# Patient Record
Sex: Female | Born: 1975
Health system: Southern US, Community
[De-identification: ages and names within clinical notes are randomized; demographics above are authoritative.]

## PROBLEM LIST (undated history)

## (undated) DIAGNOSIS — E119 Type 2 diabetes mellitus without complications: Secondary | ICD-10-CM

## (undated) DIAGNOSIS — F32A Depression, unspecified: Secondary | ICD-10-CM

## (undated) DIAGNOSIS — K219 Gastro-esophageal reflux disease without esophagitis: Secondary | ICD-10-CM

## (undated) DIAGNOSIS — J189 Pneumonia, unspecified organism: Secondary | ICD-10-CM

## (undated) DIAGNOSIS — I10 Essential (primary) hypertension: Secondary | ICD-10-CM

## (undated) DIAGNOSIS — Z9889 Other specified postprocedural states: Secondary | ICD-10-CM

## (undated) DIAGNOSIS — J449 Chronic obstructive pulmonary disease, unspecified: Secondary | ICD-10-CM

## (undated) DIAGNOSIS — R112 Nausea with vomiting, unspecified: Secondary | ICD-10-CM

## (undated) DIAGNOSIS — Z87442 Personal history of urinary calculi: Secondary | ICD-10-CM

## (undated) DIAGNOSIS — M1990 Unspecified osteoarthritis, unspecified site: Secondary | ICD-10-CM

## (undated) DIAGNOSIS — F329 Major depressive disorder, single episode, unspecified: Secondary | ICD-10-CM

## (undated) HISTORY — PX: ABDOMINAL HYSTERECTOMY: SHX81

## (undated) HISTORY — PX: TUBAL LIGATION: SHX77

## (undated) HISTORY — PX: CHOLECYSTECTOMY: SHX55

## (undated) HISTORY — PX: LITHOTRIPSY: SUR834

---

## 2000-07-23 ENCOUNTER — Emergency Department (HOSPITAL_COMMUNITY): Admission: EM | Admit: 2000-07-23 | Discharge: 2000-07-23 | Payer: Self-pay | Admitting: *Deleted

## 2000-07-23 ENCOUNTER — Encounter: Payer: Self-pay | Admitting: Emergency Medicine

## 2001-12-31 ENCOUNTER — Emergency Department (HOSPITAL_COMMUNITY): Admission: EM | Admit: 2001-12-31 | Discharge: 2001-12-31 | Payer: Self-pay | Admitting: Emergency Medicine

## 2001-12-31 ENCOUNTER — Encounter: Payer: Self-pay | Admitting: *Deleted

## 2002-06-07 ENCOUNTER — Encounter: Payer: Self-pay | Admitting: Urology

## 2002-06-07 ENCOUNTER — Ambulatory Visit (HOSPITAL_COMMUNITY): Admission: RE | Admit: 2002-06-07 | Discharge: 2002-06-07 | Payer: Self-pay | Admitting: Urology

## 2003-04-07 ENCOUNTER — Emergency Department (HOSPITAL_COMMUNITY): Admission: EM | Admit: 2003-04-07 | Discharge: 2003-04-07 | Payer: Self-pay | Admitting: Emergency Medicine

## 2003-05-03 ENCOUNTER — Emergency Department (HOSPITAL_COMMUNITY): Admission: EM | Admit: 2003-05-03 | Discharge: 2003-05-03 | Payer: Self-pay | Admitting: Emergency Medicine

## 2004-01-03 ENCOUNTER — Emergency Department (HOSPITAL_COMMUNITY): Admission: EM | Admit: 2004-01-03 | Discharge: 2004-01-03 | Payer: Self-pay | Admitting: Emergency Medicine

## 2004-02-12 ENCOUNTER — Emergency Department (HOSPITAL_COMMUNITY): Admission: EM | Admit: 2004-02-12 | Discharge: 2004-02-12 | Payer: Self-pay | Admitting: Emergency Medicine

## 2004-02-13 ENCOUNTER — Observation Stay (HOSPITAL_COMMUNITY): Admission: EM | Admit: 2004-02-13 | Discharge: 2004-02-14 | Payer: Self-pay | Admitting: Emergency Medicine

## 2004-02-17 ENCOUNTER — Inpatient Hospital Stay (HOSPITAL_COMMUNITY): Admission: EM | Admit: 2004-02-17 | Discharge: 2004-02-20 | Payer: Self-pay

## 2006-07-28 ENCOUNTER — Ambulatory Visit (HOSPITAL_COMMUNITY): Admission: RE | Admit: 2006-07-28 | Discharge: 2006-07-28 | Payer: Self-pay | Admitting: Urology

## 2006-08-12 ENCOUNTER — Ambulatory Visit (HOSPITAL_COMMUNITY): Admission: RE | Admit: 2006-08-12 | Discharge: 2006-08-12 | Payer: Self-pay | Admitting: Urology

## 2006-11-23 ENCOUNTER — Emergency Department (HOSPITAL_COMMUNITY): Admission: EM | Admit: 2006-11-23 | Discharge: 2006-11-23 | Payer: Self-pay | Admitting: Emergency Medicine

## 2006-11-25 ENCOUNTER — Inpatient Hospital Stay (HOSPITAL_COMMUNITY): Admission: EM | Admit: 2006-11-25 | Discharge: 2006-12-02 | Payer: Self-pay | Admitting: Emergency Medicine

## 2007-01-11 ENCOUNTER — Ambulatory Visit (HOSPITAL_COMMUNITY): Admission: RE | Admit: 2007-01-11 | Discharge: 2007-01-11 | Payer: Self-pay | Admitting: Internal Medicine

## 2007-01-30 ENCOUNTER — Inpatient Hospital Stay (HOSPITAL_COMMUNITY): Admission: EM | Admit: 2007-01-30 | Discharge: 2007-02-02 | Payer: Self-pay | Admitting: Emergency Medicine

## 2007-05-25 ENCOUNTER — Inpatient Hospital Stay (HOSPITAL_COMMUNITY): Admission: EM | Admit: 2007-05-25 | Discharge: 2007-05-27 | Payer: Self-pay | Admitting: Emergency Medicine

## 2007-05-26 ENCOUNTER — Encounter (INDEPENDENT_AMBULATORY_CARE_PROVIDER_SITE_OTHER): Payer: Self-pay | Admitting: Urology

## 2007-05-31 ENCOUNTER — Ambulatory Visit: Payer: Self-pay | Admitting: Gastroenterology

## 2007-05-31 ENCOUNTER — Inpatient Hospital Stay (HOSPITAL_COMMUNITY): Admission: EM | Admit: 2007-05-31 | Discharge: 2007-06-03 | Payer: Self-pay | Admitting: Emergency Medicine

## 2007-07-26 ENCOUNTER — Emergency Department (HOSPITAL_COMMUNITY): Admission: EM | Admit: 2007-07-26 | Discharge: 2007-07-26 | Payer: Self-pay | Admitting: Emergency Medicine

## 2007-07-28 ENCOUNTER — Inpatient Hospital Stay (HOSPITAL_COMMUNITY): Admission: EM | Admit: 2007-07-28 | Discharge: 2007-08-01 | Payer: Self-pay | Admitting: Emergency Medicine

## 2008-01-18 ENCOUNTER — Observation Stay (HOSPITAL_COMMUNITY): Admission: EM | Admit: 2008-01-18 | Discharge: 2008-01-20 | Payer: Self-pay | Admitting: Emergency Medicine

## 2008-07-30 ENCOUNTER — Inpatient Hospital Stay (HOSPITAL_COMMUNITY): Admission: AD | Admit: 2008-07-30 | Discharge: 2008-08-04 | Payer: Self-pay | Admitting: Urology

## 2008-08-12 ENCOUNTER — Inpatient Hospital Stay (HOSPITAL_COMMUNITY): Admission: EM | Admit: 2008-08-12 | Discharge: 2008-08-16 | Payer: Self-pay | Admitting: Emergency Medicine

## 2008-08-20 ENCOUNTER — Observation Stay (HOSPITAL_COMMUNITY): Admission: EM | Admit: 2008-08-20 | Discharge: 2008-08-23 | Payer: Self-pay | Admitting: Emergency Medicine

## 2008-08-26 ENCOUNTER — Inpatient Hospital Stay (HOSPITAL_COMMUNITY): Admission: EM | Admit: 2008-08-26 | Discharge: 2008-08-29 | Payer: Self-pay | Admitting: Emergency Medicine

## 2008-08-31 ENCOUNTER — Ambulatory Visit (HOSPITAL_COMMUNITY): Admission: RE | Admit: 2008-08-31 | Discharge: 2008-08-31 | Payer: Self-pay | Admitting: Urology

## 2008-08-31 ENCOUNTER — Encounter (INDEPENDENT_AMBULATORY_CARE_PROVIDER_SITE_OTHER): Payer: Self-pay | Admitting: Urology

## 2008-12-06 ENCOUNTER — Ambulatory Visit (HOSPITAL_COMMUNITY): Admission: RE | Admit: 2008-12-06 | Discharge: 2008-12-06 | Payer: Self-pay | Admitting: Urology

## 2009-03-05 ENCOUNTER — Observation Stay (HOSPITAL_COMMUNITY): Admission: EM | Admit: 2009-03-05 | Discharge: 2009-03-06 | Payer: Self-pay | Admitting: Emergency Medicine

## 2009-06-15 IMAGING — CR DG CHEST 2V
2 series · 2 of 2 positions shown · non-contrast
Comparison: 01/17/2008

CLINICAL DATA: Cough

CHEST - 1 VIEW

[view not recorded (1 of 2)]
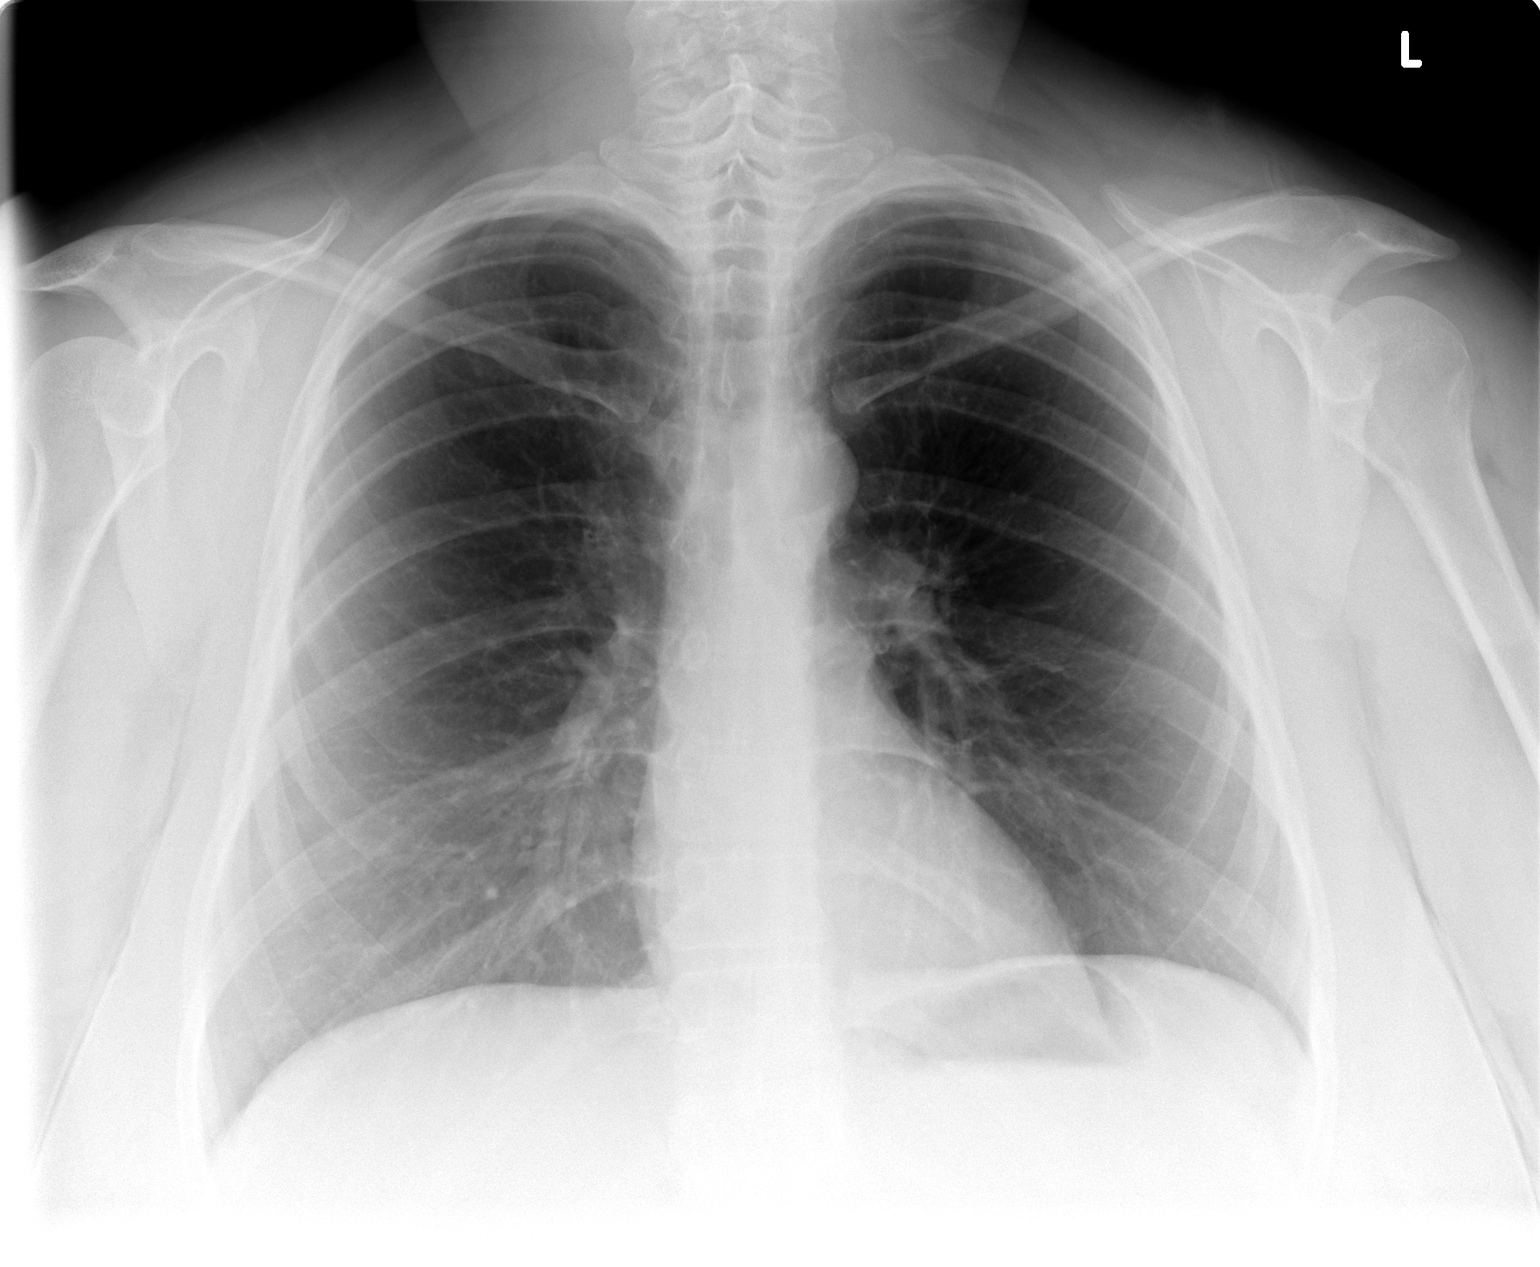

[view not recorded (2 of 2)]
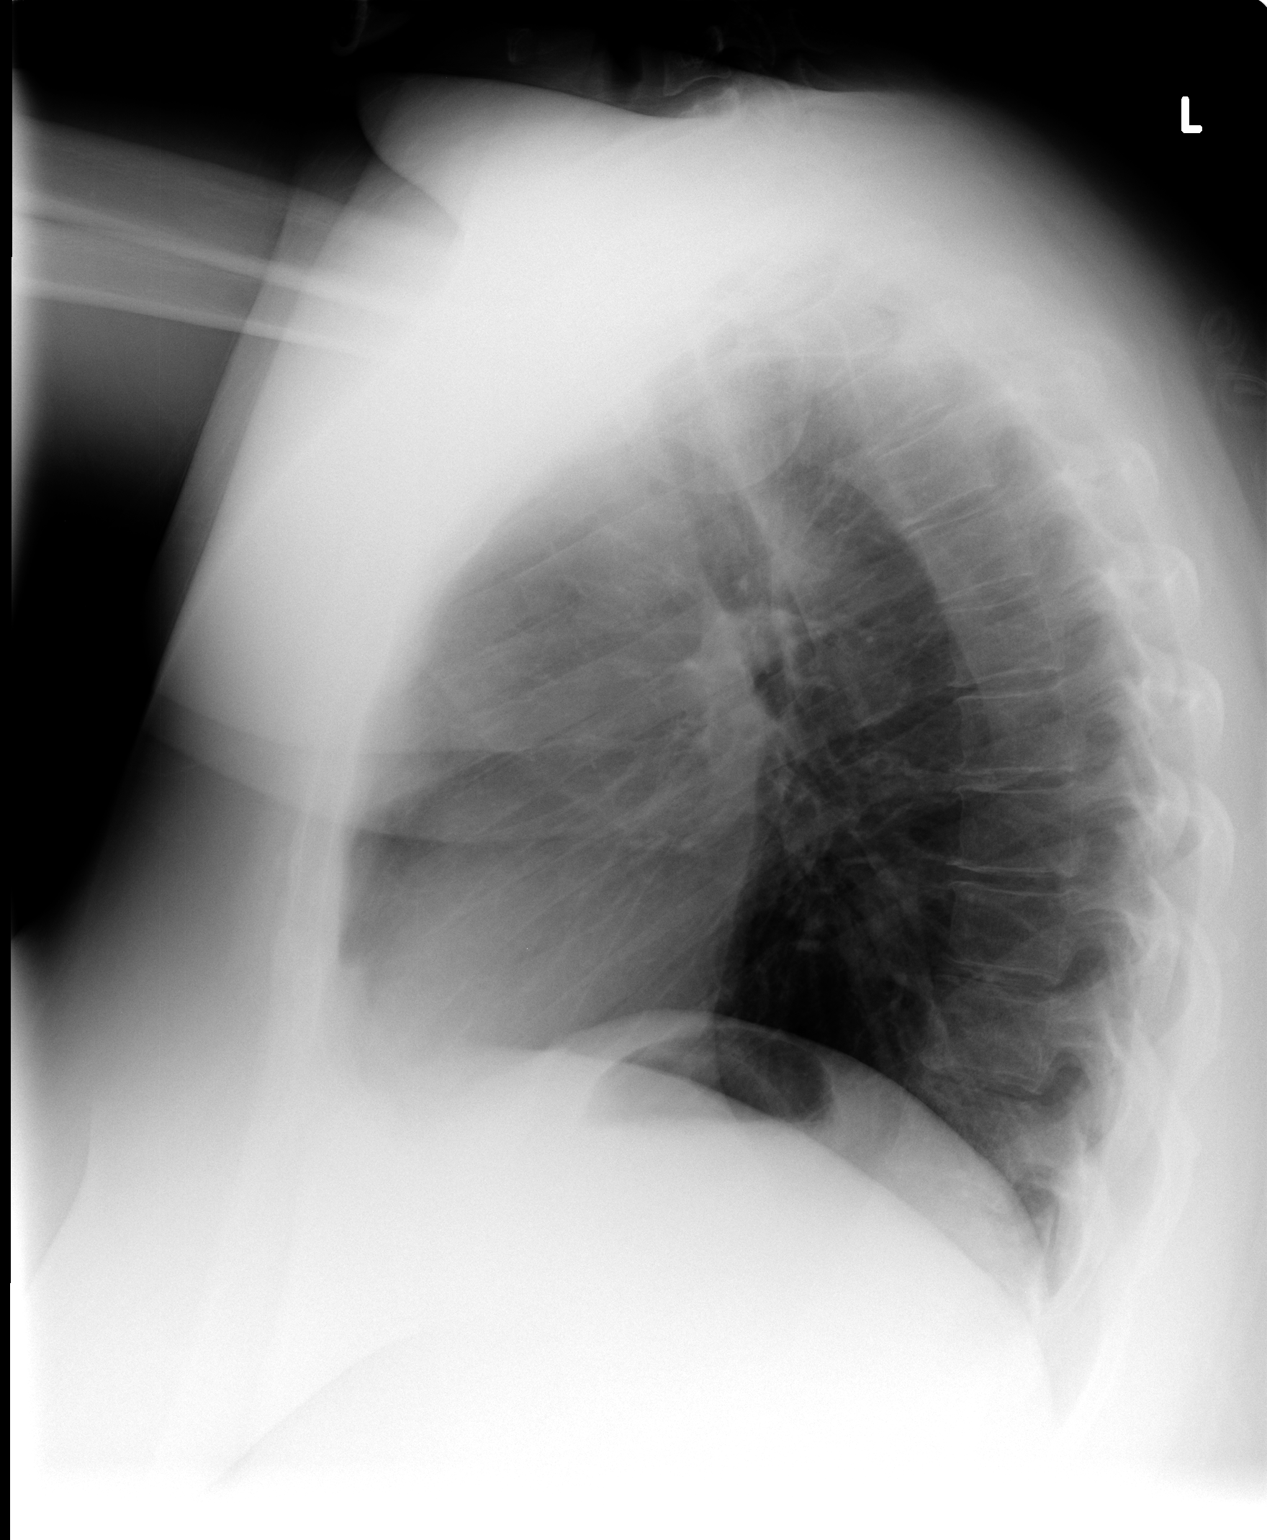

[2 of 2 positions shown; findings below may reference images not displayed]

FINDINGS: The heart size and mediastinal contours are within normal
limits.  Both lungs are clear.
IMPRESSION: No active disease.

## 2009-06-22 IMAGING — CR DG ABDOMEN 1V
2 series · 2 of 2 positions shown · non-contrast
Comparison: 08/12/2008

CLINICAL DATA: Kidney stone, left flank pain

ABDOMEN - 1 VIEW

[view not recorded (1 of 2)]
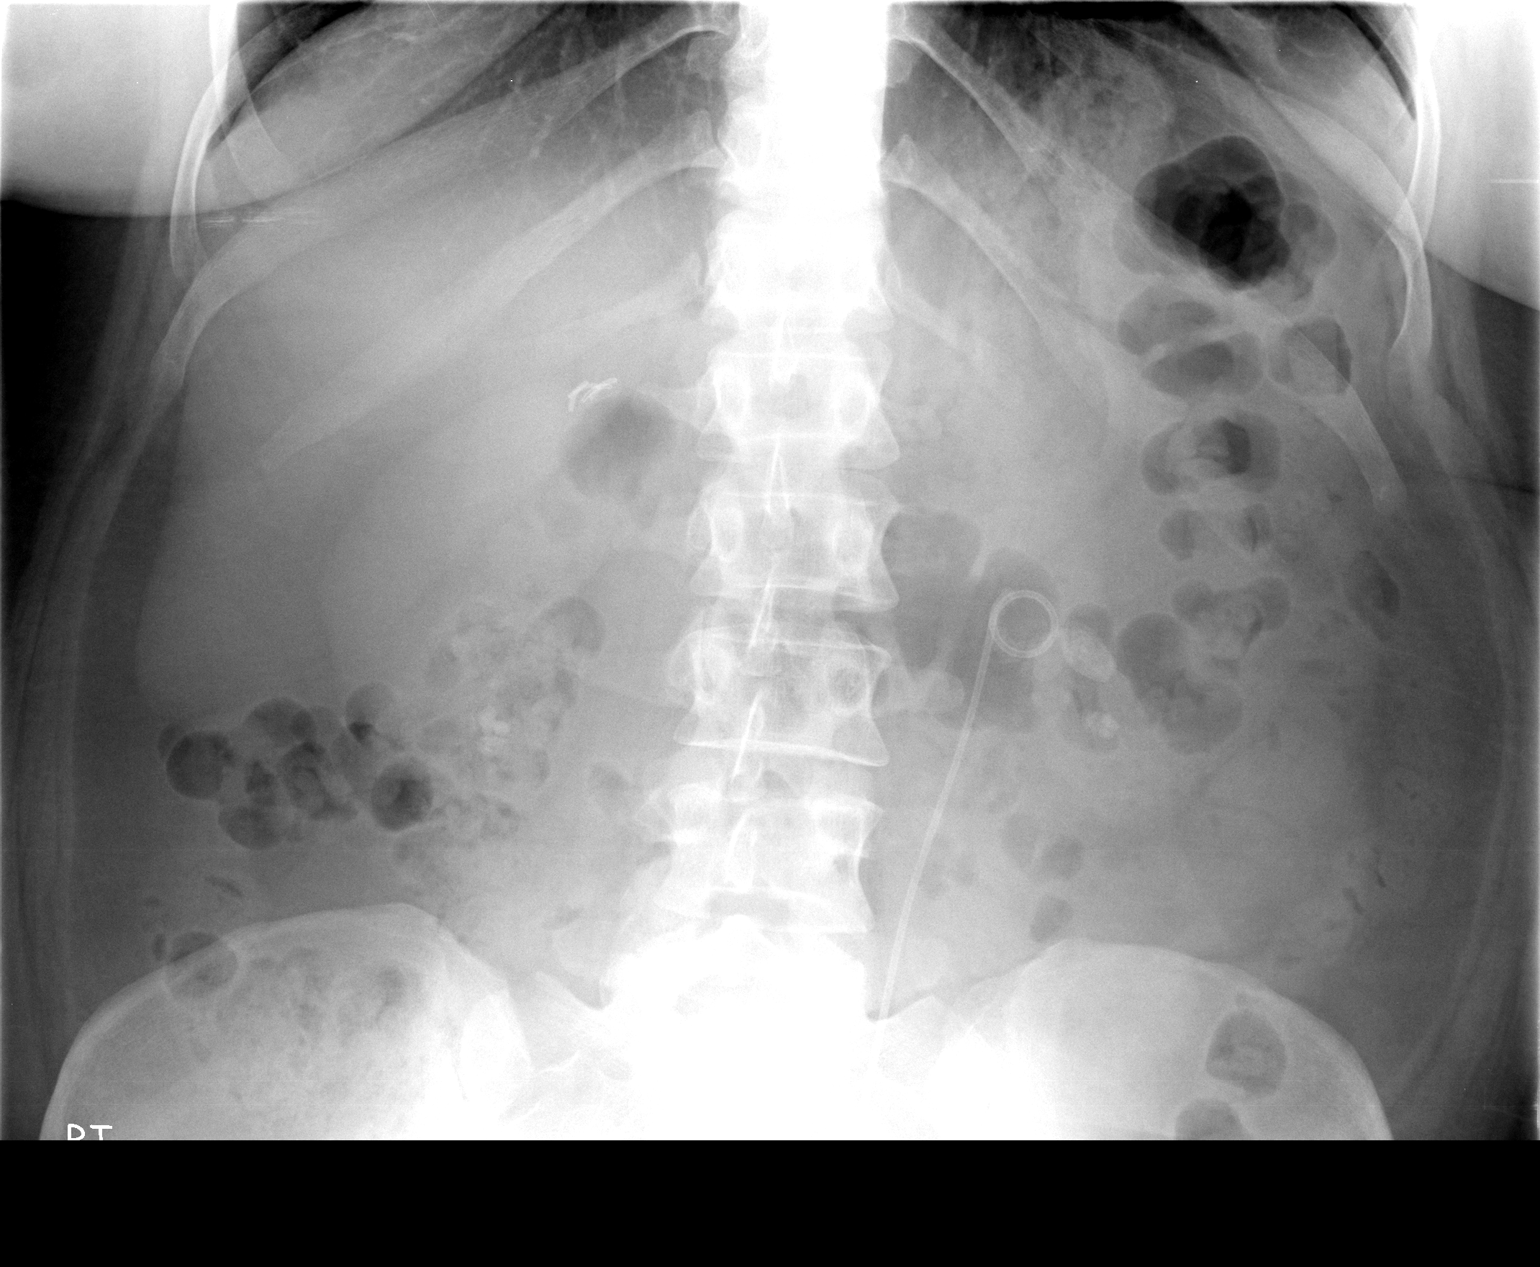

[view not recorded (2 of 2)]
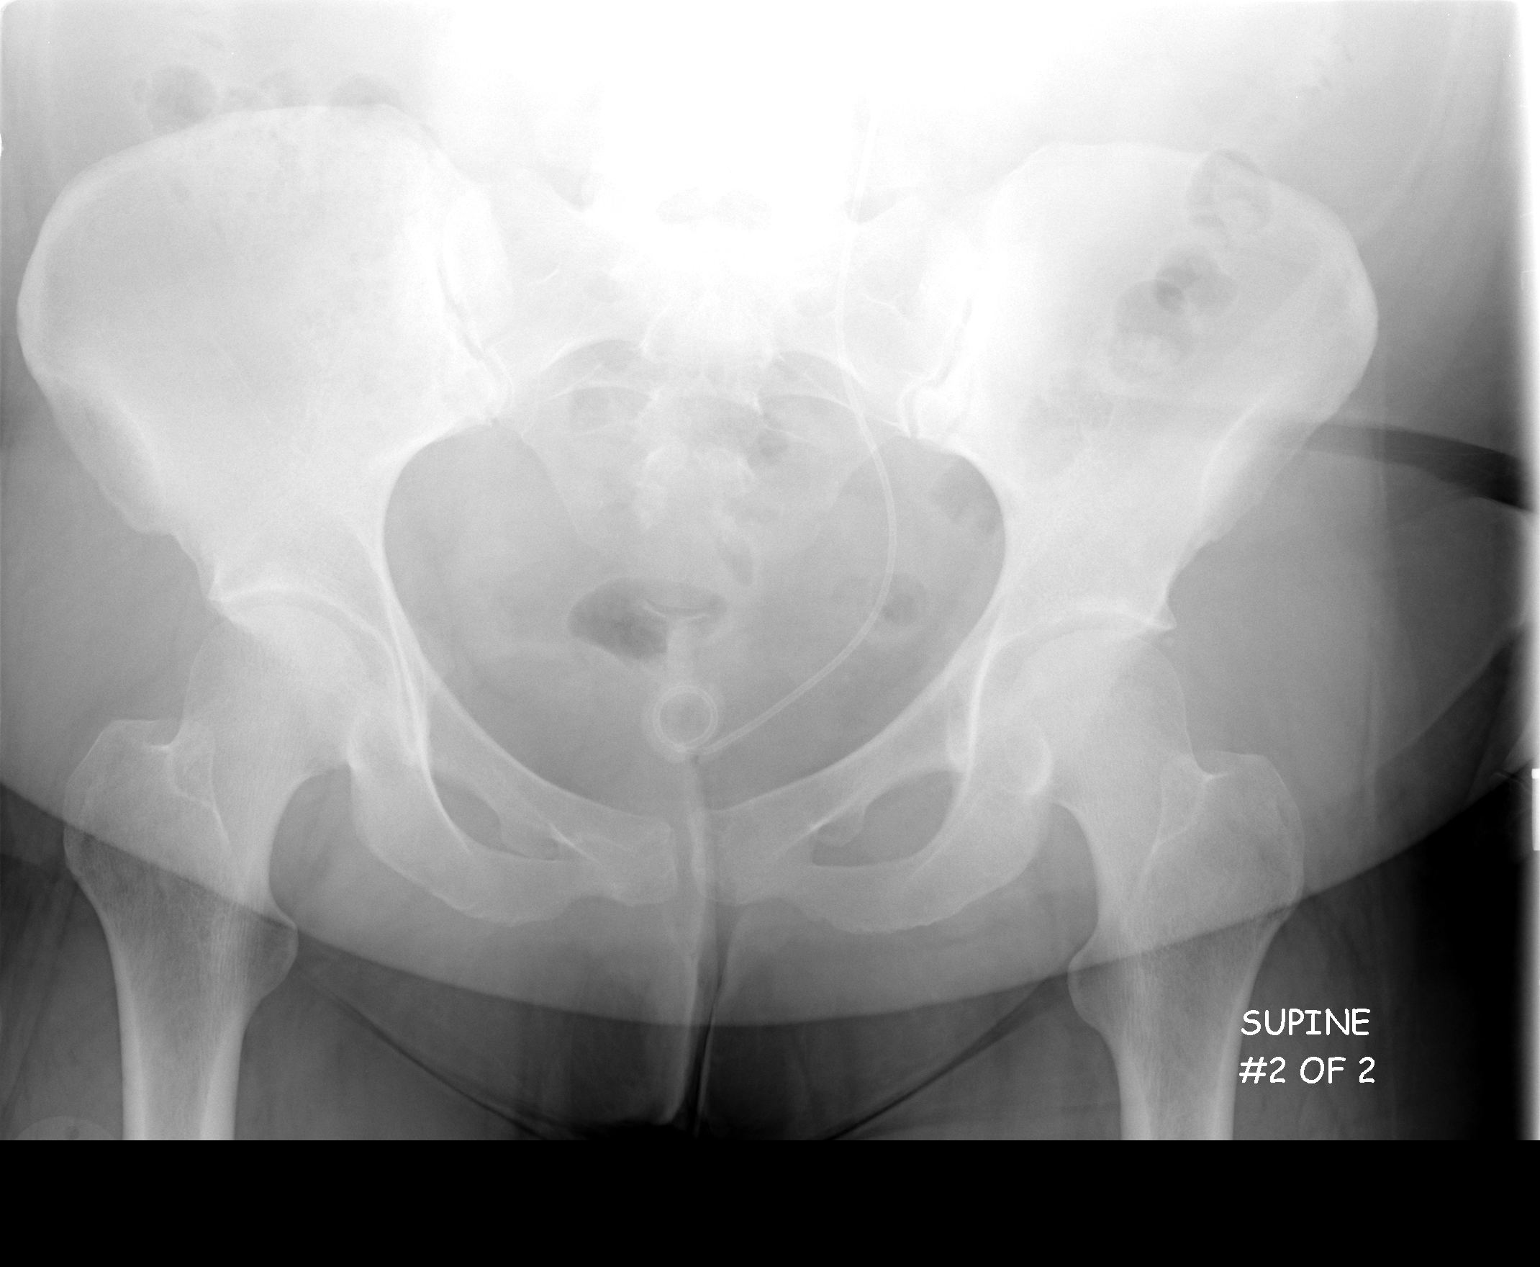

[2 of 2 positions shown; findings below may reference images not displayed]

FINDINGS: Left ureteral stent extends from expected position of left renal
pelvis to the urinary bladder.
Multiple bilateral renal calculi.
These include large calculus at mid to lower pole of left kidney,
19 x 11 mm and additional lower pole left renal calculus 9 x 7 mm.
Right renal calcifications at lower pole, in aggregate 15 x 9 mm.
There appears to be calcification surrounding the pigtail of left
ureteral stent within urinary bladder.
No definite ureteral calcification identified.
Surgical clips right upper quadrant question cholecystectomy.
Bowel gas pattern normal.
Bones unremarkable.
IMPRESSION: Large bilateral renal calculi.
Left ureteral stent with questionable calcification surrounding the
stent pigtail within the urinary bladder.

## 2009-09-16 ENCOUNTER — Inpatient Hospital Stay (HOSPITAL_COMMUNITY): Admission: EM | Admit: 2009-09-16 | Discharge: 2009-09-21 | Payer: Self-pay | Admitting: Emergency Medicine

## 2009-09-21 ENCOUNTER — Encounter: Payer: Self-pay | Admitting: Urology

## 2009-10-29 ENCOUNTER — Ambulatory Visit (HOSPITAL_COMMUNITY): Admission: RE | Admit: 2009-10-29 | Discharge: 2009-10-29 | Payer: Self-pay | Admitting: Urology

## 2009-11-28 ENCOUNTER — Ambulatory Visit (HOSPITAL_COMMUNITY): Admission: RE | Admit: 2009-11-28 | Discharge: 2009-11-28 | Payer: Self-pay | Admitting: Urology

## 2009-12-02 ENCOUNTER — Observation Stay (HOSPITAL_COMMUNITY): Admission: AD | Admit: 2009-12-02 | Discharge: 2009-12-05 | Payer: Self-pay | Admitting: Urology

## 2010-01-17 ENCOUNTER — Emergency Department (HOSPITAL_COMMUNITY): Admission: EM | Admit: 2010-01-17 | Discharge: 2010-01-17 | Payer: Self-pay | Admitting: Emergency Medicine

## 2010-02-19 ENCOUNTER — Emergency Department (HOSPITAL_COMMUNITY): Admission: EM | Admit: 2010-02-19 | Discharge: 2010-02-19 | Payer: Self-pay | Admitting: Emergency Medicine

## 2010-04-06 ENCOUNTER — Inpatient Hospital Stay (HOSPITAL_COMMUNITY): Admission: EM | Admit: 2010-04-06 | Discharge: 2010-04-08 | Payer: Self-pay | Admitting: Emergency Medicine

## 2010-04-22 ENCOUNTER — Ambulatory Visit (HOSPITAL_COMMUNITY): Admission: RE | Admit: 2010-04-22 | Discharge: 2010-04-22 | Payer: Self-pay | Admitting: Urology

## 2010-06-25 ENCOUNTER — Ambulatory Visit (HOSPITAL_COMMUNITY): Admission: RE | Admit: 2010-06-25 | Discharge: 2010-06-25 | Payer: Self-pay | Admitting: Urology

## 2010-08-11 ENCOUNTER — Ambulatory Visit (HOSPITAL_COMMUNITY): Admission: RE | Admit: 2010-08-11 | Discharge: 2010-08-11 | Payer: Self-pay | Admitting: Urology

## 2010-08-17 ENCOUNTER — Inpatient Hospital Stay (HOSPITAL_COMMUNITY): Admission: EM | Admit: 2010-08-17 | Discharge: 2010-08-21 | Payer: Self-pay | Admitting: Emergency Medicine

## 2010-08-31 ENCOUNTER — Emergency Department (HOSPITAL_COMMUNITY): Admission: EM | Admit: 2010-08-31 | Discharge: 2010-08-31 | Payer: Self-pay | Admitting: Emergency Medicine

## 2010-09-01 ENCOUNTER — Inpatient Hospital Stay (HOSPITAL_COMMUNITY): Admission: EM | Admit: 2010-09-01 | Discharge: 2010-09-04 | Payer: Self-pay | Admitting: Emergency Medicine

## 2010-11-30 HISTORY — PX: PERCUTANEOUS NEPHROLITHOTRIPSY: SHX2206

## 2011-02-11 LAB — BASIC METABOLIC PANEL
BUN: 6 mg/dL (ref 6–23)
BUN: 7 mg/dL (ref 6–23)
CO2: 26 mEq/L (ref 19–32)
CO2: 27 mEq/L (ref 19–32)
Calcium: 8.1 mg/dL — ABNORMAL LOW (ref 8.4–10.5)
Calcium: 8.5 mg/dL (ref 8.4–10.5)
Chloride: 107 mEq/L (ref 96–112)
Creatinine, Ser: 1 mg/dL (ref 0.4–1.2)
GFR calc Af Amer: >60 mL/min (ref >60)
GFR calc non Af Amer: >60 mL/min (ref >60)
GFR calc non Af Amer: >60 mL/min (ref >60)
Glucose, Bld: 74 mg/dL (ref 70–99)
Glucose, Bld: 93 mg/dL (ref 70–99)
Potassium: 4 mEq/L (ref 3.5–5.1)
Potassium: 4.3 mEq/L (ref 3.5–5.1)
Sodium: 136 mEq/L (ref 135–145)
Sodium: 138 mEq/L (ref 135–145)

## 2011-02-11 LAB — CBC
HCT: 35.5 % — ABNORMAL LOW (ref 36.0–46.0)
Hemoglobin: 11.7 g/dL — ABNORMAL LOW (ref 12.0–15.0)
Hemoglobin: 12.1 g/dL (ref 12.0–15.0)
Hemoglobin: 12.8 g/dL (ref 12.0–15.0)
Hemoglobin: 13 g/dL (ref 12.0–15.0)
MCH: 30.8 pg (ref 26.0–34.0)
MCH: 30.9 pg (ref 26.0–34.0)
MCH: 31.1 pg (ref 26.0–34.0)
MCHC: 34 g/dL (ref 30.0–36.0)
MCHC: 34.1 g/dL (ref 30.0–36.0)
MCV: 90.4 fL (ref 78.0–100.0)
Platelets: 255 10*3/uL (ref 150–400)
RBC: 4.1 MIL/uL (ref 3.87–5.11)
RBC: 4.2 MIL/uL (ref 3.87–5.11)
RDW: 13.5 % (ref 11.5–15.5)
RDW: 13.6 % (ref 11.5–15.5)
WBC: 6.9 10*3/uL (ref 4.0–10.5)
WBC: 7.3 10*3/uL (ref 4.0–10.5)
WBC: 7.6 10*3/uL (ref 4.0–10.5)

## 2011-02-11 LAB — DIFFERENTIAL
Basophils Absolute: 0 10*3/uL (ref 0.0–0.1)
Basophils Absolute: 0 10*3/uL (ref 0.0–0.1)
Basophils Relative: 0 % (ref 0–1)
Basophils Relative: 0 % (ref 0–1)
Basophils Relative: 1 % (ref 0–1)
Eosinophils Absolute: 0.1 10*3/uL (ref 0.0–0.7)
Eosinophils Absolute: 0.1 10*3/uL (ref 0.0–0.7)
Eosinophils Absolute: 0.1 10*3/uL (ref 0.0–0.7)
Eosinophils Relative: 1 % (ref 0–5)
Lymphocytes Relative: 32 % (ref 12–46)
Lymphocytes Relative: 41 % (ref 12–46)
Lymphs Abs: 2.3 10*3/uL (ref 0.7–4.0)
Lymphs Abs: 2.4 10*3/uL (ref 0.7–4.0)
Monocytes Absolute: 0.5 10*3/uL (ref 0.1–1.0)
Monocytes Relative: 7 % (ref 3–12)
Monocytes Relative: 7 % (ref 3–12)
Monocytes Relative: 8 % (ref 3–12)
Neutro Abs: 3.5 10*3/uL (ref 1.7–7.7)
Neutro Abs: 3.5 10*3/uL (ref 1.7–7.7)
Neutrophils Relative %: 48 % (ref 43–77)
Neutrophils Relative %: 51 % (ref 43–77)
Neutrophils Relative %: 58 % (ref 43–77)
Neutrophils Relative %: 59 % (ref 43–77)

## 2011-02-11 LAB — URINE CULTURE

## 2011-02-11 LAB — URINALYSIS, ROUTINE W REFLEX MICROSCOPIC
Glucose, UA: NEGATIVE mg/dL
Glucose, UA: NEGATIVE mg/dL
Protein, ur: 30 mg/dL — AB
Specific Gravity, Urine: 1.02 (ref 1.005–1.030)
Specific Gravity, Urine: 1.025 (ref 1.005–1.030)
pH: 7.5 (ref 5.0–8.0)

## 2011-02-11 LAB — URINE MICROSCOPIC-ADD ON

## 2011-02-11 LAB — POCT PREGNANCY, URINE: Preg Test, Ur: NEGATIVE

## 2011-02-12 LAB — POCT I-STAT, CHEM 8
Calcium, Ion: 1.12 mmol/L (ref 1.12–1.32)
Creatinine, Ser: 0.9 mg/dL (ref 0.4–1.2)
Glucose, Bld: 92 mg/dL (ref 70–99)
Hemoglobin: 14.3 g/dL (ref 12.0–15.0)
Potassium: 3.7 mEq/L (ref 3.5–5.1)
TCO2: 23 mmol/L (ref 0–100)

## 2011-02-12 LAB — CBC
HCT: 36 % (ref 36.0–46.0)
Hemoglobin: 11.7 g/dL — ABNORMAL LOW (ref 12.0–15.0)
Hemoglobin: 12.1 g/dL (ref 12.0–15.0)
Hemoglobin: 13 g/dL (ref 12.0–15.0)
MCH: 30.7 pg (ref 26.0–34.0)
MCH: 30.9 pg (ref 26.0–34.0)
MCHC: 33.6 g/dL (ref 30.0–36.0)
MCHC: 33.8 g/dL (ref 30.0–36.0)
Platelets: 244 10*3/uL (ref 150–400)
RBC: 3.81 MIL/uL — ABNORMAL LOW (ref 3.87–5.11)
RDW: 13.6 % (ref 11.5–15.5)

## 2011-02-12 LAB — DIFFERENTIAL
Basophils Absolute: 0 10*3/uL (ref 0.0–0.1)
Basophils Relative: 1 % (ref 0–1)
Basophils Relative: 1 % (ref 0–1)
Eosinophils Absolute: 0.1 10*3/uL (ref 0.0–0.7)
Lymphocytes Relative: 40 % (ref 12–46)
Lymphs Abs: 2.6 10*3/uL (ref 0.7–4.0)
Lymphs Abs: 3 10*3/uL (ref 0.7–4.0)
Monocytes Absolute: 0.5 10*3/uL (ref 0.1–1.0)
Monocytes Relative: 7 % (ref 3–12)
Monocytes Relative: 7 % (ref 3–12)
Monocytes Relative: 8 % (ref 3–12)
Monocytes Relative: 9 % (ref 3–12)
Neutro Abs: 2.9 10*3/uL (ref 1.7–7.7)
Neutro Abs: 3.2 10*3/uL (ref 1.7–7.7)
Neutrophils Relative %: 48 % (ref 43–77)
Neutrophils Relative %: 51 % (ref 43–77)
Neutrophils Relative %: 59 % (ref 43–77)

## 2011-02-12 LAB — BASIC METABOLIC PANEL
BUN: 7 mg/dL (ref 6–23)
CO2: 24 mEq/L (ref 19–32)
CO2: 26 mEq/L (ref 19–32)
CO2: 27 mEq/L (ref 19–32)
Calcium: 8.2 mg/dL — ABNORMAL LOW (ref 8.4–10.5)
GFR calc Af Amer: >60 mL/min (ref >60)
GFR calc non Af Amer: >60 mL/min (ref >60)
GFR calc non Af Amer: >60 mL/min (ref >60)
Glucose, Bld: 113 mg/dL — ABNORMAL HIGH (ref 70–99)
Glucose, Bld: 88 mg/dL (ref 70–99)
Potassium: 3.9 mEq/L (ref 3.5–5.1)
Potassium: 4.1 mEq/L (ref 3.5–5.1)
Sodium: 138 mEq/L (ref 135–145)
Sodium: 140 mEq/L (ref 135–145)

## 2011-02-12 LAB — URINALYSIS, ROUTINE W REFLEX MICROSCOPIC
Nitrite: POSITIVE — AB
Specific Gravity, Urine: 1.025 (ref 1.005–1.030)
Urobilinogen, UA: 0.2 mg/dL (ref 0.0–1.0)
pH: 6 (ref 5.0–8.0)

## 2011-02-12 LAB — URINE MICROSCOPIC-ADD ON

## 2011-02-12 LAB — CULTURE, BLOOD (ROUTINE X 2): Culture: NO GROWTH

## 2011-02-15 LAB — BASIC METABOLIC PANEL
BUN: 6 mg/dL (ref 6–23)
BUN: 9 mg/dL (ref 6–23)
CO2: 27 mEq/L (ref 19–32)
Calcium: 8.4 mg/dL (ref 8.4–10.5)
Creatinine, Ser: 0.98 mg/dL (ref 0.4–1.2)
Creatinine, Ser: 1.02 mg/dL (ref 0.4–1.2)
GFR calc Af Amer: >60 mL/min (ref >60)
GFR calc non Af Amer: >60 mL/min (ref >60)
GFR calc non Af Amer: >60 mL/min (ref >60)
Glucose, Bld: 109 mg/dL — ABNORMAL HIGH (ref 70–99)
Sodium: 135 mEq/L (ref 135–145)

## 2011-02-15 LAB — CBC
HCT: 35 % — ABNORMAL LOW (ref 36.0–46.0)
MCV: 89.2 fL (ref 78.0–100.0)
MCV: 89.4 fL (ref 78.0–100.0)
Platelets: 196 10*3/uL (ref 150–400)
Platelets: 230 10*3/uL (ref 150–400)
RDW: 13.4 % (ref 11.5–15.5)
WBC: 6.9 10*3/uL (ref 4.0–10.5)
WBC: 9.6 10*3/uL (ref 4.0–10.5)

## 2011-02-15 LAB — DIFFERENTIAL
Basophils Absolute: 0 10*3/uL (ref 0.0–0.1)
Basophils Relative: 0 % (ref 0–1)
Eosinophils Absolute: 0.1 10*3/uL (ref 0.0–0.7)
Eosinophils Absolute: 0.1 10*3/uL (ref 0.0–0.7)
Eosinophils Relative: 1 % (ref 0–5)
Lymphocytes Relative: 43 % (ref 12–46)
Lymphs Abs: 3 10*3/uL (ref 0.7–4.0)
Neutrophils Relative %: 49 % (ref 43–77)
Neutrophils Relative %: 79 % — ABNORMAL HIGH (ref 43–77)

## 2011-02-15 LAB — URINE CULTURE
Colony Count: NO GROWTH
Culture: NO GROWTH
Special Requests: NEGATIVE

## 2011-02-15 LAB — URINALYSIS, ROUTINE W REFLEX MICROSCOPIC
Bilirubin Urine: NEGATIVE
Glucose, UA: NEGATIVE mg/dL
Ketones, ur: NEGATIVE mg/dL
Nitrite: POSITIVE — AB
Protein, ur: 30 mg/dL — AB
Specific Gravity, Urine: 1.02 (ref 1.005–1.030)
Urobilinogen, UA: 0.2 mg/dL (ref 0.0–1.0)
pH: 7 (ref 5.0–8.0)

## 2011-02-15 LAB — URINE MICROSCOPIC-ADD ON

## 2011-02-17 LAB — URINE CULTURE

## 2011-02-17 LAB — COMPREHENSIVE METABOLIC PANEL
ALT: 14 U/L (ref 0–35)
AST: 17 U/L (ref 0–37)
Albumin: 3.6 g/dL (ref 3.5–5.2)
Calcium: 9.5 mg/dL (ref 8.4–10.5)
Creatinine, Ser: 1.17 mg/dL (ref 0.4–1.2)
GFR calc Af Amer: >60 mL/min (ref >60)
GFR calc non Af Amer: 53 mL/min — ABNORMAL LOW (ref >60)
Sodium: 137 mEq/L (ref 135–145)
Total Protein: 7.5 g/dL (ref 6.0–8.3)

## 2011-02-17 LAB — URINALYSIS, ROUTINE W REFLEX MICROSCOPIC
Glucose, UA: NEGATIVE mg/dL
Specific Gravity, Urine: >1.03 — ABNORMAL HIGH (ref 1.005–1.030)
Urobilinogen, UA: 0.2 mg/dL (ref 0.0–1.0)
pH: 6 (ref 5.0–8.0)

## 2011-02-17 LAB — URINE MICROSCOPIC-ADD ON

## 2011-02-18 LAB — DIFFERENTIAL
Lymphocytes Relative: 9 % — ABNORMAL LOW (ref 12–46)
Lymphs Abs: 1.4 10*3/uL (ref 0.7–4.0)
Neutrophils Relative %: 86 % — ABNORMAL HIGH (ref 43–77)

## 2011-02-18 LAB — URINALYSIS, ROUTINE W REFLEX MICROSCOPIC
Nitrite: NEGATIVE
Specific Gravity, Urine: 1.02 (ref 1.005–1.030)
Urobilinogen, UA: 0.2 mg/dL (ref 0.0–1.0)
pH: 6.5 (ref 5.0–8.0)

## 2011-02-18 LAB — URINE MICROSCOPIC-ADD ON

## 2011-02-18 LAB — CBC
Platelets: 261 10*3/uL (ref 150–400)
WBC: 15.3 10*3/uL — ABNORMAL HIGH (ref 4.0–10.5)

## 2011-02-18 LAB — BASIC METABOLIC PANEL
BUN: 16 mg/dL (ref 6–23)
Creatinine, Ser: 1.28 mg/dL — ABNORMAL HIGH (ref 0.4–1.2)
GFR calc non Af Amer: 47 mL/min — ABNORMAL LOW (ref >60)
Potassium: 3.8 mEq/L (ref 3.5–5.1)

## 2011-02-22 LAB — URINALYSIS, ROUTINE W REFLEX MICROSCOPIC
Bilirubin Urine: NEGATIVE
Glucose, UA: NEGATIVE mg/dL
Ketones, ur: NEGATIVE mg/dL
Nitrite: POSITIVE — AB
Protein, ur: NEGATIVE mg/dL
Specific Gravity, Urine: 1.02 (ref 1.005–1.030)
Urobilinogen, UA: 0.2 mg/dL (ref 0.0–1.0)
pH: 6.5 (ref 5.0–8.0)

## 2011-02-22 LAB — URINE MICROSCOPIC-ADD ON

## 2011-02-22 LAB — PREGNANCY, URINE: Preg Test, Ur: NEGATIVE

## 2011-02-22 LAB — URINE CULTURE: Colony Count: 80000

## 2011-03-05 LAB — DIFFERENTIAL
Basophils Absolute: 0 10*3/uL (ref 0.0–0.1)
Basophils Relative: 0 % (ref 0–1)
Basophils Relative: 0 % (ref 0–1)
Basophils Relative: 0 % (ref 0–1)
Basophils Relative: 1 % (ref 0–1)
Eosinophils Absolute: 0.1 10*3/uL (ref 0.0–0.7)
Eosinophils Absolute: 0.1 10*3/uL (ref 0.0–0.7)
Eosinophils Absolute: 0.3 10*3/uL (ref 0.0–0.7)
Eosinophils Relative: 1 % (ref 0–5)
Eosinophils Relative: 2 % (ref 0–5)
Eosinophils Relative: 4 % (ref 0–5)
Lymphocytes Relative: 29 % (ref 12–46)
Lymphs Abs: 1.4 10*3/uL (ref 0.7–4.0)
Lymphs Abs: 1.7 10*3/uL (ref 0.7–4.0)
Monocytes Absolute: 0.4 10*3/uL (ref 0.1–1.0)
Monocytes Absolute: 0.6 10*3/uL (ref 0.1–1.0)
Monocytes Absolute: 0.7 10*3/uL (ref 0.1–1.0)
Monocytes Relative: 6 % (ref 3–12)
Monocytes Relative: 6 % (ref 3–12)
Monocytes Relative: 9 % (ref 3–12)
Neutrophils Relative %: 58 % (ref 43–77)
Neutrophils Relative %: 71 % (ref 43–77)

## 2011-03-05 LAB — BASIC METABOLIC PANEL
BUN: 10 mg/dL (ref 6–23)
BUN: 12 mg/dL (ref 6–23)
CO2: 25 mEq/L (ref 19–32)
CO2: 25 mEq/L (ref 19–32)
CO2: 29 mEq/L (ref 19–32)
CO2: 29 mEq/L (ref 19–32)
Calcium: 8.5 mg/dL (ref 8.4–10.5)
Chloride: 106 mEq/L (ref 96–112)
Chloride: 107 mEq/L (ref 96–112)
Chloride: 109 mEq/L (ref 96–112)
Creatinine, Ser: 0.91 mg/dL (ref 0.4–1.2)
GFR calc Af Amer: >60 mL/min (ref >60)
GFR calc Af Amer: >60 mL/min (ref >60)
GFR calc non Af Amer: >60 mL/min (ref >60)
GFR calc non Af Amer: >60 mL/min (ref >60)
Glucose, Bld: 101 mg/dL — ABNORMAL HIGH (ref 70–99)
Glucose, Bld: 124 mg/dL — ABNORMAL HIGH (ref 70–99)
Potassium: 3.7 mEq/L (ref 3.5–5.1)
Potassium: 3.7 mEq/L (ref 3.5–5.1)
Potassium: 4 mEq/L (ref 3.5–5.1)
Potassium: 4.2 mEq/L (ref 3.5–5.1)
Sodium: 136 mEq/L (ref 135–145)
Sodium: 139 mEq/L (ref 135–145)

## 2011-03-05 LAB — URINE CULTURE

## 2011-03-05 LAB — CBC
HCT: 32.4 % — ABNORMAL LOW (ref 36.0–46.0)
HCT: 34 % — ABNORMAL LOW (ref 36.0–46.0)
HCT: 38.2 % (ref 36.0–46.0)
Hemoglobin: 11.2 g/dL — ABNORMAL LOW (ref 12.0–15.0)
Hemoglobin: 13.1 g/dL (ref 12.0–15.0)
MCHC: 34.2 g/dL (ref 30.0–36.0)
MCHC: 34.3 g/dL (ref 30.0–36.0)
MCHC: 34.5 g/dL (ref 30.0–36.0)
MCHC: 34.5 g/dL (ref 30.0–36.0)
MCV: 91.9 fL (ref 78.0–100.0)
MCV: 91.9 fL (ref 78.0–100.0)
MCV: 92.4 fL (ref 78.0–100.0)
Platelets: 202 10*3/uL (ref 150–400)
Platelets: 263 10*3/uL (ref 150–400)
RBC: 3.68 MIL/uL — ABNORMAL LOW (ref 3.87–5.11)
RBC: 4.15 MIL/uL (ref 3.87–5.11)
RDW: 12.9 % (ref 11.5–15.5)
WBC: 8.5 10*3/uL (ref 4.0–10.5)

## 2011-03-05 LAB — PREGNANCY, URINE: Preg Test, Ur: NEGATIVE

## 2011-03-05 LAB — GLUCOSE, CAPILLARY: Glucose-Capillary: 105 mg/dL — ABNORMAL HIGH (ref 70–99)

## 2011-03-05 LAB — URINE MICROSCOPIC-ADD ON

## 2011-03-05 LAB — URINALYSIS, ROUTINE W REFLEX MICROSCOPIC
Ketones, ur: NEGATIVE mg/dL
Nitrite: POSITIVE — AB
pH: 5.5 (ref 5.0–8.0)

## 2011-03-11 LAB — BASIC METABOLIC PANEL
BUN: 14 mg/dL (ref 6–23)
CO2: 23 mEq/L (ref 19–32)
Calcium: 8.7 mg/dL (ref 8.4–10.5)
Creatinine, Ser: 1.46 mg/dL — ABNORMAL HIGH (ref 0.4–1.2)
GFR calc non Af Amer: 41 mL/min — ABNORMAL LOW (ref >60)
Glucose, Bld: 107 mg/dL — ABNORMAL HIGH (ref 70–99)

## 2011-03-11 LAB — CBC
MCHC: 34.5 g/dL (ref 30.0–36.0)
Platelets: 156 10*3/uL (ref 150–400)
RDW: 13.5 % (ref 11.5–15.5)

## 2011-03-11 LAB — DIFFERENTIAL
Basophils Absolute: 0 10*3/uL (ref 0.0–0.1)
Basophils Relative: 0 % (ref 0–1)
Lymphocytes Relative: 16 % (ref 12–46)
Monocytes Absolute: 0.8 10*3/uL (ref 0.1–1.0)
Neutro Abs: 6.3 10*3/uL (ref 1.7–7.7)
Neutrophils Relative %: 73 % (ref 43–77)

## 2011-03-11 LAB — URINALYSIS, ROUTINE W REFLEX MICROSCOPIC
Bilirubin Urine: NEGATIVE
Leukocytes, UA: NEGATIVE
Nitrite: POSITIVE — AB
Specific Gravity, Urine: >1.03 — ABNORMAL HIGH (ref 1.005–1.030)
Urobilinogen, UA: 1 mg/dL (ref 0.0–1.0)

## 2011-03-11 LAB — URINE CULTURE: Colony Count: 65000

## 2011-03-11 LAB — CULTURE, BLOOD (ROUTINE X 2): Culture: NO GROWTH

## 2011-03-11 LAB — URINE MICROSCOPIC-ADD ON

## 2011-04-14 NOTE — H&P (Signed)
[name], [name] NO.:  [id number]   MEDICAL RECORD NO.:  [id number]          PATIENT TYPE:  INP   LOCATION:  A302                          FACILITY:  [facility]   PHYSICIAN:  Dorris Singh, DO    DATE OF BIRTH:  1976/08/06   DATE OF ADMISSION:  05/31/2007  DATE OF DISCHARGE:  LH                              HISTORY & PHYSICAL   The patient is a 35 year old, Caucasian female who presented to the  emergency room with chief complaint of abdominal pain. The patient  stated that the pain started last night and it was acute and actually  had several episodes of it. She reports that she was discharged from the  hospital for the treatment of kidney stones and was under the care of  Dr. Rito Ehrlich while she was here. She stated that after she was  discharged she took pain medications which did not relieve her pain and  then noted that the pain had increased in her right flank and in her  lower right abdomen. While in the emergency room she reported the pain  to be 10/10. She does note that it was associated with nausea and  vomiting but has not noticed any abdominal distention.   PAST MEDICAL HISTORY:  Significant for asthma and kidney stones. She has  an allergy to Vicodin and morphine.   PAST SURGICAL HISTORY:  Cholecystectomy and tubal ligation. She admits  to smoking within the last 12 months. Denies any alcohol use.   HOME MEDICATIONS:  Her home medications that she was currently on were  Cipro and Percocet 7.5 mg p.o.   REVIEW OF SYSTEMS:  Negative for weight loss, weakness or appetite,  decreased fatigue and sweats, negative for eye pain or discharge,  negative for ear pain, hearing loss, rhinorrhea or sore throat. Negative  for chest pain or palpitations, negative for cough or dyspnea. Positive  for nausea, vomiting, abdominal pain and cramps. GU:  Negative for  dysuria, urgency, hematuria, flank pain or frequency. MUSCULOSKELETAL:  Negative for arthralgias,  back pain or neck pain. SKIN:  Negative for  pruritus, rash or abrasions. NEUROLOGIC:  Negative for headache,  confusion, weakness or altered mental status. METABOLIC:  Negative for  excessive thirst and cold.   PHYSICAL EXAMINATION:  VITAL SIGNS:  Her vitals are as noted.  Temperature 97.9, pulse 88, respirations 20 and blood pressure is  124/79.  GENERAL:  This is a well-developed, well-nourished, well-hydrated,  Caucasian female who is in no acute distress.  HEAD:  Normocephalic, atraumatic.  SKIN:  Good turgor, good texture, no scars, rashes or bruises noted.  EYES:  Equal and reactive to light. EOMI. No discharge or scleral  icterus noted.  EARS:  Shape is symmetrical. No tenderness or discharge. Gross auditory  acuity.  NOSE:  Symmetrical, no nasal discharge.  THROAT:  No erythema or exudate.  NECK:  No masses, full range of motion.  HEART:  Regular rate and rhythm without murmur. S1, S2 present.  LUNGS:  Chest symmetrical with respirations. No crackles, rales or  rhonchi.  ABDOMEN:  Round. Positive stretch marks. Tenderness lower  left and lower  right quadrant with upper right quadrant tenderness. No masses noted.  Positive CVA tenderness.  GU:  Female external genitalia within normal limits.  MUSCULOSKELETAL:  No muscular weakness, full range of motion.  VASCULAR:  Carotid and radial and femoral and dorsalis pedis pulse  intact.  NEUROLOGIC:  Cranial nerves II-XII grossly intact.   LABORATORY DATA:  Labs that were done on patient include a CBC which  demonstrated a white count of 15.8, hemoglobin of 14.4, hematocrit of  42.4 and platelet count of 253. She also had a CMP done with a sodium of  137, potassium 3.8, chloride 105, carbon dioxide 23, glucose 142. BUN  14, creatinine 0.84, calcium 8.1. She had a urine pregnancy test and it  was negative. Her urine was amber in color, she was nitrate negative and  leukocyte esterase did have some small remnants there. She had a  CT of  the abdomen without contrast which shows left hydroureteronephrosis and  there is a concern that the patient has an obstruction which is  uncertain. No findings suggest appendicitis. There is a suspicion of  colitis. Pelvic CT shows negative for any acute disease.   IMPRESSION/PLAN:  1. Colitis. Will have GI actually consult and participate. Will keep      patient n.p.o. Will obtain blood cultures as well and continue pain      management.  2. Nausea and vomiting. Will keep the patient on antinausea medication      as well as monitoring her progress.  3. Renal stones. Will have Dr. Rito Ehrlich to consult and participate      regarding CT findings and also will place Foley to assist patient      with voiding at this point in time.      Dorris Singh, DO  Electronically Signed     CB/MEDQ  D:  05/31/2007  T:  06/01/2007  Job:  [id number]

## 2011-04-14 NOTE — Discharge Summary (Signed)
[name], [name]             ACCOUNT NO.:  [id number]   MEDICAL RECORD NO.:  [id number]          PATIENT TYPE:  INP   LOCATION:  A302                          FACILITY:  [facility]   PHYSICIAN:  Osvaldo Shipper, MD     DATE OF BIRTH:  [birth year]   DATE OF ADMISSION:  05/31/2007  DATE OF DISCHARGE:  07/04/2008LH                               DISCHARGE SUMMARY   DISCHARGE DIAGNOSES:  1. Left abdominal and flank pain, possibly urological in origin.  2. History of asthma, stable.  3. History of nephrolithiasis.   Please see the H&P dictated by Dr. Elige Radon for details regarding the  patient's presenting illness.   BRIEF HOSPITAL COURSE:  Briefly, this is a 35 year old Caucasian female  who presented to the ED complaining of abdominal pain.  A CT scan of the  abdomen and pelvis was obtained, which showed left  hydroureteronephrosis.  Suspicion for colitis was also noted.  No other  acute findings were appreciated.  Because of the possibility of colitis,  the patient was admitted to the medical service.  GI was consulted, who  were not quite impressed by the CT scan and they felt that all of her  symptoms are secondary to urological problems.  Hence, Dr. Jerre Simon was  asked to see this patient and the plan was to do a cystoscopy today.  However, overnight the patient's pain has improved and she was hesitant  on undergoing a cystoscopy today.  Dr. Jerre Simon felt that this could  potentially be done as an outpatient, hence he will discharge the  patient.  I just saw the patient briefly.  She was feeling better.  Pain  was improved though still there.  Dr. Jerre Simon prescribed Toradol.  She  feels comfortable going home at this point.  Her vital signs are all  stable.  Her lab work from yesterday is all stable.  She is tolerating a  p.o. intake.  She has been ambulating.  I think considering overall,  medically she is stable to go and she will definitely need urological  workup.   DISCHARGE  MEDICATIONS:  She has been discharged on Toradol 10 mg every 6  hours as needed and Cipro twice daily as before, unknown dose.   Follow up with Dr. Jerre Simon.   Consultation during this admission from Kassie Mends, M.D., from  gastroenterology.   Imaging studies include CT of the abdomen and pelvis as described above.   Eventually the patient needs a urological workup.      Osvaldo Shipper, MD  Electronically Signed     GK/MEDQ  D:  06/03/2007  T:  06/03/2007  Job:  [id number]   cc:   Ky Barban, M.D.  Fax: [phone]

## 2011-04-14 NOTE — H&P (Signed)
[name], [name]             ACCOUNT NO.:  [id number]   MEDICAL RECORD NO.:  [id number]          PATIENT TYPE:  OBV   LOCATION:  A338                          FACILITY:  [facility]   PHYSICIAN:  Thomasenia Bottoms, MDDATE OF BIRTH:  12-10-1975   DATE OF ADMISSION:  08/19/2008  DATE OF DISCHARGE:  LH                              HISTORY & PHYSICAL   CHIEF COMPLAINT:  Abdominal pain.   HISTORY OF PRESENT ILLNESS:  Mrs. [name] is a 35 year old woman with  recurrent kidney stones who presents tonight with right flank pain which  was quite severe and doubled her over.  The patient was just discharged  from the hospital after being treated for kidney stones 3 days ago while  her pain was not completely resolved, it was much better, and she has a  followup appointment with the urologist in 2 days from now.  She said  everything was actually fine.  She has been drinking plenty of fluids  until today about 8 o'clock p.m., the pain hit her suddenly and she was  immobilized because of the pain.  She recognizes the pain, this is the  same pain she has had before.  She actually said the last time she went  bathroom, she did see some pieces of kidney stone; however, the pain has  not eased up at all.  She has not had any fevers.  She has been  nauseated.  No diarrhea.   PAST MEDICAL HISTORY:  Significant for kidney stones which she has had,  she says over 10 years.  She said with this last stone, she has been to  the hospital three times in the last 3 weeks.  She has a history of  asthma and depression also.  Also of note, the patient's past medical  history, she has a renal stent in place in the right side.  She says she  has required interventions for her stones in the past.   FAMILY HISTORY:  Noncontributory.   SOCIAL HISTORY:  She does smoke cigarettes.  No alcohol or illicit  drugs.   REVIEW OF SYSTEMS:  CONSTITUTIONAL:  No weight loss.  Appetite was okay  prior to this.  GU:  She  is having right flank pain which is the pain  she has been having.  RESPIRATORY:  She is having some increased  wheezing tonight as well.  All other systems reviewed and are negative.   PHYSICAL EXAMINATION:  In the emergency department, her temperature is  97.7, blood pressure 157/96, pulse 115, respiratory rate 22, and O2 sat  96% on room air.  GENERAL:  The patient is well nourished, well developed, slightly obese,  and crying.  HEENT:  Normocephalic and atraumatic.  Pupils are round.  Sclerae  nonicteric.  Oral mucosa moist.  NECK:  Supple.  No lymphadenopathy.  No thyromegaly.  No jugular venous  distention.  CARDIAC:  Regular rate and rhythm.  LUNGS:  Some diffuse mild expiratory wheezes in all her lung fields.  ABDOMEN:  Soft, quiet.  No hepatosplenomegaly.  No rebound, but she is  tender.  EXTREMITIES:  No evidence of clubbing, cyanosis, or edema.  NEUROLOGIC:  She is alert and oriented x3.  Her cranial nerves II  through XII are intact grossly.  She has 5/5 strength in her upper and  lower extremities.  The sensory exam is intact grossly in her upper and  lower extremities.  She has a normal gait and a normal affect.  SKIN:  Intact with no open lesions or rashes.  MUSCULOSKELETAL:  No evidence of effusion of her joints.  She has good  range of motion.   DATA:  Her urinalysis reveals cloudy urine, positive nitrite, large  leukocyte esterase, few epithelial cells, too numerous to count wbc's  and too numerous count rbc's with a large hemoglobin.  Her total white  blood cell count is 17.2, hemoglobin 14.9, hematocrit 44, and platelet  count is 345.  Sodium is 138, potassium 3.6, chloride 108, bicarb 23,  glucose 115, BUN 19, and creatinine 0.92.  The patient had an abdominal  x-ray which reveals large bilateral renal calculi, left ureteral stent  with questionable calcifications surrounding stent pigtail within no  urinary bladder.   ASSESSMENT AND PLAN:  1. Continued  kidney stone, now with recurrent pain and likely UTI.  We      will put the patient on IV fluids, pain control, and antibiotics.      Her last urine culture was positive for coag negative, greater than      100 colonies of coag-negative staph which was resistant to      fluoroquinolones, but sensitive to nafcillin and was also resistant      to penicillin, so we will put her on nafcillin and await recurrent      cultures and certainly we will consult Urology for intervention.  2. Mild asthma flare.  I will put the patient on nebs.  We will give      one dose of steroids in the hospital, but we will primarily      discontinue nebs.  She may benefit from a steroid inhaler as well.  3. Tobacco abuse.  The patient should be counseled to quit smoking.      Thomasenia Bottoms, MD  Electronically Signed     CVC/MEDQ  D:  08/20/2008  T:  08/20/2008  Job:  [id number]   cc:   [facility]

## 2011-04-14 NOTE — Consult Note (Signed)
[name], Kelly NO.:  [id number]   MEDICAL RECORD NO.:  [id number]          PATIENT TYPE:  INP   LOCATION:  A332                          FACILITY:  [facility]   PHYSICIAN:  Skeet Latch, DO    DATE OF BIRTH:  [birth year]   DATE OF CONSULTATION:  08/01/2008  DATE OF DISCHARGE:                                 CONSULTATION   HISTORY OF PRESENT ILLNESS:  This is a 35 year old Caucasian female who  has a significant medical history for nephrolithiasis and asthma who  presents with constant flank pain.  Apparently the patient states a few  days ago she began having severe flank pain bilaterally.  The patient  states that she had a stent placed on the left side approximately one  month prior and continues to have flank pain.  The patient states the  last few days she began having severe nausea and vomiting as well as the  bilateral flank pain and decided to come to the emergency room to be  evaluated.  The patient was admitted by Dr. Jerre Simon, placed on  antiemetics and continues to be treated.  The patient states that her  pain is slightly improved.  Overall she is feeling slightly better.   PAST MEDICAL HISTORY:  Positive for asthma diagnosed 2 years ago and  nephrolithiasis.   PAST SURGICAL HISTORY:  Tubal ligation, cholecystectomy, multiple  urologic procedures.   HOME MEDICATIONS:  1. Zyprexa 10 mg daily.  2. Lexapro 10 mg daily.  3. Albuterol inhaler 4 times a day as needed.   SOCIAL HISTORY:  She lives in [place] with her daughter and is  unemployed.  She smokes 1/2 pack of cigarettes a day.  No history of  alcohol or drug abuse.   FAMILY HISTORY:  Noncontributory.   REVIEW OF SYSTEMS:  Overall is unremarkable except for genitourinary,  which is positive for flank pain.   PHYSICAL EXAMINATION:  GENERAL:  She is awake and alert, in no acute  distress.  HEENT:  Head is atraumatic, normocephalic.  No scleral icterus.  Eyes  are PERRLA, EOMI.  NECK:   Soft, supple, nontender, nondistended.  CARDIOVASCULAR:  Regular rate and rhythm.  No murmurs, rubs or gallops.  LUNGS:  Clear to auscultation.  She has a slight expiratory wheeze.  No  crackles or rales.  ABDOMEN:  Obese, soft, nontender, nondistended.  She does have some  flank pain on percussion bilaterally.  EXTREMITIES:  No clubbing, cyanosis or edema.  NEUROLOGIC:  She is awake and alert.  Cranial nerves II-XII are grossly  intact.   White count 7,000, hemoglobin 13.5, hematocrit 40.3, platelet count 235.  Sodium 136, potassium 4.0, chloride 106, CO2 26, glucose 96, BUN 7,  creatinine 0.86.  Urinalysis was positive for nitrites, a large amount  of blood, moderate leukocytes.  Urine culture is pending.  Microscopic  urine showed bacteria, 21 to 50 RBCs, 11 to 20 WBCs, a few squamous  epithelials.  CT of her abdomen and pelvis:  Abdomen showed bilateral  nephrocalcinosis and nephrolithiasis.  No definite ureteral calculus.  It showed left hydroureteronephrosis with a  left ureteral stent.  A CT  of the pelvis showed minor suspicion for calculus adjacent to the left  ureteral stent at the upper sacral level.   ASSESSMENT/PLAN:  1. Nephrolithiasis.  I would defer to urology for further      recommendations regarding her nephrolithiasis.  2. History of asthma.  The patient will be placed on albuterol inhaler      to be used on a daily basis and as needed.  3. I believe the patient has a history of bipolar disorder. She is on      Zyprexa and Lexapro.  We will recommend continuing these      medications on a daily basis.   I appreciate this consult and will gladly follow this patient during her  hospital stay.      Skeet Latch, DO  Electronically Signed     SM/MEDQ  D:  08/01/2008  T:  08/01/2008  Job:  [id number]   cc:   Ky Barban, M.D.  Fax: [phone]

## 2011-04-14 NOTE — H&P (Signed)
NAME:  YONG, WAHLQUIST NO.:  [id number]   MEDICAL RECORD NO.:  [id number]           PATIENT TYPE:  AMB   LOCATION:  DAY                           FACILITY:  [facility]   PHYSICIAN:  Ky Barban, M.D.DATE OF BIRTH:  1975-12-30   DATE OF ADMISSION:  DATE OF DISCHARGE:  LH                              HISTORY & PHYSICAL   ADDENDUM   A 35 year old female who was discharged from [facility]  yesterday.  Please use that history and physical.  There is no change in  her history and physical.  She has a stone in the right upper ureter and  I am treating her conservatively, but she also has a stent in the left  ureter, which needed to be removed, but it has made a large stone on the  lower end in the bladder, so I am bringing out to do holmium laser  lithotripsy of this stone to get the stent out.  She also has a 2-cm  wart on the inside of the right labia major and need to be excised.  I  have discussed the procedures with the patient.  She understands and  want me to go ahead and proceed.  She is coming as an outpatient in the  morning, will be done as outpatient, and will be discharged home.       Ky Barban, M.D.  Electronically Signed     MIJ/MEDQ  D:  08/30/2008  T:  08/31/2008  Job:  [id number]

## 2011-04-14 NOTE — H&P (Signed)
NAMECRISTIE, [name]             ACCOUNT NO.:  [id number]   MEDICAL RECORD NO.:  [id number]          PATIENT TYPE:  INP   LOCATION:  A224                          FACILITY:  [facility]   PHYSICIAN:  Dennie Maizes, M.D.   DATE OF BIRTH:  04-15-1976   DATE OF ADMISSION:  05/25/2007  DATE OF DISCHARGE:  LH                              HISTORY & PHYSICAL   CHIEF COMPLAINT:  Severe left flank pain radiating to the front, nausea,  and vomiting.   HISTORY OF PRESENT ILLNESS:  This 35 year old female has a past history  of recurrent urolithiasis.  She has had several hospitalizations and  procedures for kidney stones.  Her last hospitalization was in 03/08.  She complains of severe intermittent left flank pain radiating to the  front since today.  It is also associated with nausea and vomiting.  She  denied having any fever, chills, voiding difficulty, or gross hematuria  at present.  She came to the emergency room at [facility].  Microscopic hematuria was noted.  She was evaluated with a CT scan of  the abdomen and pelvis.  This revealed multiple bilateral renal calculi  and the patient also had evidence of a stone in the kidney.  There was a  6 x 7.5 mm stone in the left distal ureter of the S1 with obstruction.  The patient has been admitted to the hospital for pain control, IV  fluids, and further treatment.   PAST MEDICAL HISTORY:  1. Recurrent urolithiasis:  She has undergone multiple procedures.  2. History of bronchial asthma,.  3. Status post cholecystectomy, status post tubal ligation.  4. Morbid obesity.   MEDICATIONS:  None.   ALLERGIES:  Vicodin and morphine.   PHYSICAL EXAMINATION:  GENERAL:  The patient is comfortable after  receiving an IV Dilaudid.  HEENT:  Head, eyes, ears, nose, and throat are normal.  NECK:  The neck has no masses.  LUNGS:  The lungs are clear to auscultation.  HEART:  Regular rate and rhythm, no murmurs.  ABDOMEN:  The abdomen is soft, no  palpable flank mass.  Costovertebral  angle tenderness was noted.  The bladder was not palpable.   IMPRESSION:  Left distal ureteral calculus with obstruction, left  hydronephrosis, left renal colic, bilateral renal calculi.   PLAN:  1. X-ray of the area to localize the stones.  2. IV fluids, parenteral narcotics.  3. Discussed with the patient regarding the management options.  She      is scheduled to undergo a cystoscopy, left      retrograde pyelogram, ureteroscopy, stone extraction, and a stent      placement, in the a.m.  I explained to the patient regarding the      diagnosis, operative details, alternative treatments, outcomes, and      possible risks and complications, and she has agreed for the      procedure to be done.      Dennie Maizes, M.D.  Electronically Signed     SK/MEDQ  D:  05/25/2007  T:  05/25/2007  Job:  [id number]   cc:  Dennie Maizes, M.D.  Fax: [phone]

## 2011-04-14 NOTE — Consult Note (Signed)
NAMEMERCER, STALLWORTH             ACCOUNT NO.:  [id number]   MEDICAL RECORD NO.:  [id number]          PATIENT TYPE:  INP   LOCATION:  A302                          FACILITY:  [facility]   PHYSICIAN:  Kassie Mends, M.D.      DATE OF BIRTH:  [birth year]   DATE OF CONSULTATION:  05/31/2007  DATE OF DISCHARGE:                                 CONSULTATION   REASON FOR CONSULTATION:  Colitis.   HISTORY OF PRESENT ILLNESS:  Ms. [name] is a 35 year old female who is  status post cystoscopy, left ureteroscopy and stone extraction and  possible stent placement by Dr. Rito Ehrlich 5 days ago.  She complains of  worsening left flank, left lower quadrant pain since the procedure.  She  tells me she began vomiting yesterday.  She complains of severe pains;  it feels like having a baby.  She describes it as intermittent  contractions.  She had not had a bowel movement in a week.  She had  taken multiple Correctols at home.  She denies any history of  constipation prior to this.  Denies any history of diarrhea.  Denies any  mucus in her stools.  Denies any rectal bleeding or melena.  She does  have hematuria.  She denies any aspirin or nonsteroidal anti-  inflammatory drugs.  She did have some fever, chills and diaphoresis at  home.  Since admission, she has had 2 enemas.  She has had 4 good bowel  movements as a result of the enemas.   She had a CT without contrast today which showed left  hydroureteronephrosis, a calculus in left kidney approximately 5 mm,  multiple right kidney calculi, mural thickening of the mid descending  colon and generous stool in the colon.  She also was found to have a  small hiatal hernia.   PAST MEDICAL AND SURGICAL HISTORY:  1. Recurrent urolithiasis with history of lithotripsy.  2. She is status post cystoscopy, ureteroscopy, stone extraction and      stent placement by Dr. Rito Ehrlich recently.  3. Asthma.  4. Cholecystectomy in 1995.  5. Cholelithiasis.  6. Tubal  ligation.  7. Obesity.   ALLERGIES:  1. VICODIN.  2. MORPHINE.   MEDICATIONS PRIOR TO ADMISSION:  1. Percocet 7.5 mg p.r.n.  2. Cipro b.i.d.   FAMILY HISTORY:  No known family history of colorectal carcinoma, liver,  chronic GI problems including IBD.   SOCIAL HISTORY:  Ms. Carollo is single.  She has 3 healthy children.  She has been smoking about a pack a week for the last 5 years. She  denies any alcohol or drug use.   REVIEW OF SYSTEMS:  CONSTITUTIONAL:  Weight is stable.  See HPI;  otherwise negative.   PHYSICAL EXAMINATION:  VITAL SIGNS:  Weight is 124.5 kg, height 66  inches, temperature 97.4, pulse 115, respirations 16, blood pressure  123/85.  GENERAL:  Ms. Gsell is an obese Caucasian female who is alert,  oriented, pleasant and cooperative, in no acute distress.  HEENT:  Sclerae are clear.  Conjunctivae pink.  Oropharynx pink and moist  without any lesions.  NECK:  Supple without mass or thyromegaly.CHEST:  Regular rate and rhythm.  Normal S1 and S2 without murmurs, clicks,  rubs, or gallops.  LUNGS:  Clear to auscultation bilaterally.ABDOMEN:  Protuberant with positive bowel sounds x4.  No bruits auscultated.  She  does have significant left lower quadrant tenderness on deep palpation.  There is no rebound tenderness or guarding.  She has left costovertebral  angle tenderness.  EXTREMITIES:  Without clubbing or edema bilaterally.SKIN:  Pink, warm  and dry without any rashes or jaundice.   LABORATORY STUDIES:  WBCs 15.8, hemoglobin 14.4, hematocrit 42.4,  platelets 253.  Calcium 8.9, sodium 137, potassium 3.8, chloride 105,  CO2 of 23, BUN 14, creatinine 0.84 and glucose 142.  She had a urine hCG  which was negative.  Her urinalysis was positive for high specific  gravity, small amount of bilirubin, 15 ketones, moderate blood, protein  30, a small amount of leukocytes, a few squamous epithelial cells and a  few bacteria.   IMPRESSION:  Ms. Newton is a  35 year old female who is status post  cystoscopy, left ureteroscopy, stone extraction and stent placement by  Dr. Rito Ehrlich 5 days ago.  She complains of severe left flank and left  lower quadrant worsening pain since the procedure.  She had a CT of the  abdomen and pelvis without contrast which showed mural thickening in the  mid descending colon, left hydroureteronephrosis, left kidney calculus 5  mm, right multiple small calculi and generous stool in the colon.  She  denies any previous history of constipation or diarrhea.  Has received 2  Fleet's enemas with good results since admission.  There is no relieve  in her abdominal pain since the bowel movements.   I suspect her abdominal pain is multifactorial secondary to a urinary  tract infection or renal lithiasis and constipation.  There is no  clinical evidence of colitis to correlate with her CT scan.   PLAN:  1. Sips of clear liquids.  2. Agree with Urology Consult.  3. IV fluids, pain control and antiemetics.  4. Will follow up on constipation, and if she continues to have      problems, would consider flexible sigmoidoscopy.  5. Defer antibiotic coverage to the hospitalist of Dr. Rito Ehrlich.   I would like to thank the [facility] for allowing [place] to  participate in the care of Ms. Frazee.      Lorenza Burton, N.P.      Kassie Mends, M.D.  Electronically Signed   KJ/MEDQ  D:  05/31/2007  T:  05/31/2007  Job:  [id number]

## 2011-04-14 NOTE — H&P (Signed)
[name], [name]             ACCOUNT NO.:  [id number]   MEDICAL RECORD NO.:  [id number]          PATIENT TYPE:  OBV   LOCATION:  A338                          FACILITY:  [facility]   PHYSICIAN:  Osvaldo Shipper, MD     DATE OF BIRTH:  [birth year]   DATE OF ADMISSION:  01/18/2008  DATE OF DISCHARGE:  LH                              HISTORY & PHYSICAL   ADMITTING DIAGNOSES:  1. Acute bronchitis.  2. History of nephrolithiasis.   CHIEF COMPLAINT:  Cough and shortness of breath for the past 4 days.   HISTORY OF THE PRESENT ILLNESS:  The patient is a 35 year old Caucasian  female with a history of asthma and nephrolithiasis who is obese; and,  who was in her usual state of health until this past Saturday when she  started having a cough.  This was followed by shortness of breath and  wheezing.  Symptoms had gotten worse to the point that she decided to  come in last night to the ED.  She has been in the ED about 7 or 8 hours  now and has failed multiple nebulizer treatments.  Her cough is dry.  She did have a fever up to 102 until about 2 days ago.  She took Aleve  for this fever.  She denies any chest pain.  Her daughter has been sick  with similar symptoms.  She admits to having some nausea, but no emesis.  No abdominal pain at this time.   MEDICATIONS:  she is just on albuterol inhaler as needed.   ALLERGIES:  MORPHINE AND VICODIN.   PAST MEDICAL HISTORY:  1. Asthma diagnosed two years ago, but she has always had bronchitis.  2. Nephrolithiasis.   PAST SURGICAL HISTORY:  1. Tubal ligation.  2. Cholecystectomy.  3. The patient has had multiple urological procedures for her stone      disease.   SOCIAL HISTORY:  The patient lives in [place] with her daughter and  is currently unemployed.  Smokes about a half pack of cigarettes on a  daily basis; has not smoked in the past 5 days.  No alcohol use.  No  illicit drug use.   FAMILY HISTORY:  The family history is  noncontributory, however,  actually it is negative according to the patient.   REVIEW OF SYSTEMS:  CONSTITUTIONAL:  Generally the review of systems is  positive for weakness.  HEENT: Unremarkable.  CARDIOVASCULAR:  Unremarkable.  RESPIRATORY:  As in the HPI.  GASTROINTESTINAL:  Unremarkable.  GENITOURINARY:  Unremarkable.  NEUROLOGICAL:  Unremarkable.  PSYCHIATRIC:  Unremarkable.  The other systems are  unremarkable.   PHYSICAL EXAMINATION:  VITAL SIGNS:  Temperature 98.0, blood pressure  136/48, heart rate 110-120, respiratory rate 24, and saturation 94% on  room air.  GENERAL APPEARANCE:  On general exam this is an obese white female who  is having multiple coughing spells and she is wheezing, but is in no  distress.  HEENT:  There is no pallor and no icterus.  Oral mucous membrane moist.  No oral lesions are noted.  NECK:  The neck is soft  and supple.  No thyromegaly is appreciated.  HEART:  Cardiovascular - S1and S2, tachycardiac, regular.  No murmurs  appreciated.  LUNGS:  The patient has diffuse end-expiratory wheezing bilaterally.  No  crackles present.  She is using some accessory muscles, but is very  comfortable.  She has coughing spells as stated above.  ABDOMEN:  The abdomen is obese, nontender and nondistended.  No mass or  organomegaly is appreciated.  EXTREMITIES:  The extremities show no edema.  Peripheral pulses are  palpable.  NEUROLOGIC EXAMINATION:  Neurologically the patient is alert and  oriented x3.  No focal neurological deficits are present.   LABORATORY DATA:  No labs have been done.  Chest x-ray did not show any  acute process.   ASSESSMENT:  This is a 35 year old Caucasian female who has a history of  asthma who presents with cough, shortness of breath and wheezing for the  past few days consistent with acute bronchitis.  She has been exposed to  her daughter who had similar symptoms.  This is most likely some viral  bronchitis.  Atypical bacterial  infection is also to be considered in  the differential.   PLAN:  Levaquin to cover for possible atypical bacterial process. We  will get blood draws in the form of CBC, CMET and ABGs.  Hopefully, this  patient will improve in the next day and two; if not, we will obtain a  pulmonary consultation.  Nebulizers in the form of Xopenex and Atrovent  will be used.  She is tachycardiac so I will refrain from using  albuterol.  Advair will be initiated.  I will also give her antitussives  to control her cough.  Her nephrolithiasis at this time is stable.  We  will follow up on the blood work and change the treatment as needed.      Osvaldo Shipper, MD  Electronically Signed     GK/MEDQ  D:  01/18/2008  T:  01/18/2008  Job:  [id number]

## 2011-04-14 NOTE — Op Note (Signed)
[name], [name]             ACCOUNT NO.:  [id number]   MEDICAL RECORD NO.:  [id number]          PATIENT TYPE:  INP   LOCATION:  A224                          FACILITY:  [facility]   PHYSICIAN:  Dennie Maizes, M.D.   DATE OF BIRTH:  Feb 18, 1976   DATE OF PROCEDURE:  05/26/2007  DATE OF DISCHARGE:  05/27/2007                               OPERATIVE REPORT   PREOPERATIVE DIAGNOSES:  1. Left distal ureteral calculus with obstruction.  2. Left hydronephrosis.  3. Left renal colic.   POSTOPERATIVE DIAGNOSES:  1. Left distal ureteral calculus with obstruction.  2. Left hydronephrosis.  3. Left renal colic.   OPERATIVE PROCEDURE:  1. Cystoscopy.  2. Left retrograde pyelogram.  3. Left ureteroscopy stone extraction.  4. Left ureteral stent placement.   ANESTHESIA:  General.   SURGEON:  Dennie Maizes, M.D.   COMPLICATIONS:  None.   DRAINS:  A 6-French 26-cm-size left ureteral stent with a string.   SPECIMEN:  Left ureteral calculi which was sent for chemical analysis.   INDICATIONS FOR PROCEDURE:  This 35 year old female has a history of  recurrent urolithiasis.  She came to the emergency room with severe left  flank pain.  CT scan revealed multiple bilateral renal calculi.  There  was a 6-mm-size stone in the left distal ureter at the level of the  sacrum without obstruction and hydronephrosis.  The patient was unable  to pass the stone.  She was taken to the OR today for cystoscopy, left  retrograde pyelogram, left ureteroscopic stone extraction and stent  placement.   DESCRIPTION OF PROCEDURE:  General anesthesia was induced and the  patient was placed on the OR table in the dorsal lithotomy position.  The lower abdomen and genitalia were prepped and draped in a sterile  fashion.  Urethral stenosis was noted and the urethra was dilated up to  28-French with straight metal sounds.  Cystoscopy was done with a 25-  [place] scope.  The appearance of the bladder was normal.  A  5-French  wedge catheter was then placed in the left ureteral orifice.  About 7 mL  of Renografin 60 were injected into the collecting system and a  retrograde pyelogram was done with C-arm fluoroscopy.  There is a small  filling defect about 6 mm in size in the left distal ureter at the level  of the sacrum.  There was proximal hydroureter and hydronephrosis.   A 5-French open-ended catheter was then placed in the left distal  ureter.  A 0.038-inch Bentson guidewire was then inserted to the left  collecting system without any difficulty.  The open-ended catheter was  then removed.  The distal ureter was then dilated using a 10-cm 18-  French balloon dilating catheter.  The balloon dilating catheter was  then removed, leaving the guidewire in place.   Ureteroscopy was done with an 8.5-French rigid ureteroscope.  Stone was  seen at the level of the sacrum.  The stone was engaged in a nitinol  ZeroTip wire basket and removed without any difficulty.  The instruments  were removed.  A 6-French 26-cm-size stent with  a string was then  inserted into the left collecting system.  The patient was transferred  to the PACU in a satisfactory condition.      Dennie Maizes, M.D.  Electronically Signed     SK/MEDQ  D:  05/26/2007  T:  05/26/2007  Job:  [id number]

## 2011-04-14 NOTE — Group Therapy Note (Signed)
NAMESERAPHINA, [name]             ACCOUNT NO.:  [id number]   MEDICAL RECORD NO.:  [id number]          PATIENT TYPE:  INP   LOCATION:  A337                          FACILITY:  [facility]   PHYSICIAN:  Dorris Singh, DO    DATE OF BIRTH:  February 22, 1976   DATE OF PROCEDURE:  08/15/2008  DATE OF DISCHARGE:                                 PROGRESS NOTE   HISTORY OF PRESENT ILLNESS:  The patient seen today feeling a little bit  better.  She had been seen by Dr. Jerre Simon who recommended that he see her  outpatient.  Currently, she is having an exacerbation of her asthma, and  she is being treated for that.  Also, the patient is complaining of not  being able to sleep.  Will give her sleep aide as well as for  constipation.   PHYSICAL EXAMINATION:  VITAL SIGNS:  Temperature 97.8, pulse 78,  respirations 20, blood pressure 93/68.  GENERAL:  The patient is a 35 year old Caucasian female who is well-  developed, well-nourished in no acute distress.  HEART:  Regular rate and rhythm.  LUNGS:  Clear to auscultation bilaterally.  Bilateral coarse breath  sounds on expiration with some wheezing.  ABDOMEN:  Soft, nontender, nondistended.  EXTREMITIES:  Positive pulses.  No ecchymosis or edema noted.   LABORATORY DATA:  Labs for today are as follows.  White count 6.4,  hemoglobin 11.9, hematocrit 34.9 and platelet count 253.  Sodium is 139,  potassium 4.4, chloride 112, CO2 22, glucose 158, BUN 6 and creatinine  0.82.   ASSESSMENT/PLAN:  1. Acute exacerbation of asthma with bronchitis.  We will continue      with current treatments which include nebulizer treatments as well      as steroids.  The patient seems okay with that, we will see how she      continues to improve.  2. Renal colic.  Her pain seems to be improving.  Will go ahead and      continue to monitor her on pain medication.  3. Insomnia.  Will order patient's Ambien to see if that helps for      constipation.  Will actually order Dulcolax  suppositories, Colace      and a Fleet's enema if needed and a UTI.  She is currently on      Cipro.  Will continue this therapy.  Anticipate the patient being      able to be discharged to home by tomorrow.      Dorris Singh, DO  Electronically Signed     CB/MEDQ  D:  08/15/2008  T:  08/15/2008  Job:  [id number]

## 2011-04-14 NOTE — H&P (Signed)
NAME:  [name], [name]             ACCOUNT NO.:  [id number]   MEDICAL RECORD NO.:  [id number]          PATIENT TYPE:  INP   LOCATION:  A332                          FACILITY:  [facility]   PHYSICIAN:  Ky Barban, M.D.DATE OF BIRTH:  [birth year]   DATE OF ADMISSION:  DATE OF DISCHARGE:  LH                              HISTORY & PHYSICAL   CHIEF COMPLAINT:  Right flank pain.   HISTORY:  This 35 year old female is well-known to me.  She has  recurrent episodes of renal calculi.  She was in [facility] on  June 26, 2008.  She at that time was having left renal colic.  She had a  large 1-cm stone in the left UPJ so I ended up putting a double-J stent.  She was sent home.  She never came back for treatment.  I was going to  go ahead and do ESL, but she never came back.  Now, she comes in today  complaining of pain on the right side with nausea, vomiting, having  hematuria, and she has a previous history of a kidney stone on the right  side.  Also, may be one of the stone is moving, so I have advised her to  let us admit her in the hospital and do further workup and manage this  pain.   PAST HISTORY:  History of kidney stones, several ESL, ureteral stone  baskets.  No history of diabetes or hypertension.   REVIEW OF SYSTEMS:  Unremarkable.   PHYSICAL EXAMINATION:  GENERAL:  Moderately obese, in severe distress,  fully conscious, alert, and oriented.  VITAL SIGNS:  Blood pressure 120/80, temperature is normal.  CENTRAL NERVOUS SYSTEM:  Negative.  HEAD, NECK, EYE, AND ENT:  Negative.  CHEST:  Symmetrical.  HEART:  Regular sinus rhythm, no murmur.  ABDOMEN:  Soft, flat.  Liver, spleen, and kidneys not palpable and 1+  right CVA tenderness.  PELVIC:  Deferred.  EXTREMITIES:  Normal.   IMPRESSION:  Bilateral renal calculi, right renal colic.   PLAN:  IV fluids, parenteral analgesia and get a CT screening.     Ky Barban, M.D.  Electronically Signed    MIJ/MEDQ   D:  07/30/2008  T:  07/31/2008  Job:  [id number]

## 2011-04-14 NOTE — H&P (Signed)
[name], [name]             ACCOUNT NO.:  [id number]   MEDICAL RECORD NO.:  [id number]          PATIENT TYPE:  OBV   LOCATION:  A322                          FACILITY:  [facility]   PHYSICIAN:  Dennie Maizes, M.D.   DATE OF BIRTH:  June 13, 1976   DATE OF ADMISSION:  08/26/2008  DATE OF DISCHARGE:  LH                              HISTORY & PHYSICAL   CHIEF COMPLAINT:  Severe right flank pain radiating to the front.  History of recurrent urolithiasis.   HISTORY OF PRESENT ILLNESS:  This 35 year old female has a history of  recurrent urolithiasis for about 10 years.  She has had multiple  hospitalizations and surgeries for kidney stones.  She has undergone  multiple ureteroscopies for stone extraction, as well as lithotripsies.  She had undergone cystoscopy, left retrograde pyelogram, left ureteral  stent placement, ESL of left renal calculi last week.  She is now having  severe right flank pain radiating to the front since 3:00 p.m.  yesterday.  She came to the emergency room at [facility].  The  pain was not adequately controlled in the emergency room.  She has been  admitted to the hospital for further treatment and pain control.   X-RAY:  KUB of the area revealed a 5-mm size stone in the proximal right  ureter at the level of the L3 transverse process.  There were multiple  bilateral renal stones.  The patient also has a left ureteral stent.   There is no history of fever, chills, voiding difficulty or gross  hematuria at present.   PAST MEDICAL HISTORY:  1. Recurrent urolithiasis status post multiple ureteroscopies for      stone extraction, as well as lithotripsies.  The last lithotripsy      was done a week ago.  2. History of depression.  3. Bronchial asthma.   MEDICATIONS:  1. Lexapro.  2. Zyprexa.   ALLERGIES:  1. HYDROCODONE.  2. MORPHINE SULFATE.  3. DARVOCET-N 100.   PHYSICAL EXAMINATION:  GENERAL:  The patient is comfortable after  receiving  parenteral narcotics.  HEENT:  Normal.  LUNGS:  Clear to auscultation.  HEART:  Regular rate and rhythm.  No murmurs.  ABDOMEN:  Soft.  No palpable flank mass.  Moderate right costovertebral  angle tenderness was noted.  Bladder not palpable.  No suprapubic  tenderness.   LABORATORY DATA:  Admission labs - CBC revealed WBC of 19.7, hemoglobin  14.1, hematocrit 41.7.  Glucose 121, BUN 14, creatinine 1.01.   IMPRESSION:  Right upper ureteral calculus with  obstruction.  right  renal colic, bilateral renal calculi, possible urinary tract infection.   PLAN:  1. Urine culture and sensitivity today.  Will start the patient on IV      Levaquin.  2. PCA with Dilaudid.  3. Chest x-ray, KUB area.  4. Dr. __________  will see the patient and decide about further      treatment.      Dennie Maizes, M.D.  Electronically Signed     SK/MEDQ  D:  08/27/2008  T:  08/27/2008  Job:  [id number]

## 2011-04-14 NOTE — Group Therapy Note (Signed)
[name], HORKEY             ACCOUNT NO.:  [id number]   MEDICAL RECORD NO.:  [id number]          PATIENT TYPE:  INP   LOCATION:  A337                          FACILITY:  [facility]   PHYSICIAN:  Osvaldo Shipper, MD     DATE OF BIRTH:  [birth year]   DATE OF PROCEDURE:  08/14/2008  DATE OF DISCHARGE:                                 PROGRESS NOTE   SUBJECTIVE:  Patient is complaining of wheezing, which has not improved  since yesterday.  She has runny nose, which has improved.  She is still  complaining of abdominal pain and pain in the back, which is consistent  with renal colic.  She has stopped having nausea and vomiting and is  requesting that her diet be advanced.   OBJECTIVE:  VITAL SIGNS:  All stable.  She is afebrile.  Saturating 96%  on room air.  HEENT:  Except for slightly congested nasal mucosa, is unremarkable.  LUNGS:  Bilateral diffuse wheezing.  No crackles are appreciated.  CARDIOVASCULAR:  Normal.  Regular.  No murmurs appreciated.  ABDOMEN:  Soft.  There is tenderness in the CVA angle bilaterally.  Bowel sounds are present.  No masses or organomegaly is appreciated.  EXTREMITIES:  No edema.   LABS:  Her white count is normal.  Hemoglobin is slightly low at 11.5.  Platelet count is normal.  Potassium is 3.4.  Renal function otherwise  is normal.   ASSESSMENT/PLAN:  1. Acute bronchitis:  We will intensify her treatment for this.  We      will put her on nebs q.4h.  Start her on steroids.  No need for      antibiotics at this time.  She is already on Cipro for a urinary      tract infection.  Tessalon Perles will be ordered.  2. Renal colic:  I have just spoken to Dr. Jerre Simon, who said he will      come and take a look at her.  We will decrease her pain      medications.  3. She is complaining of a headache, which is probably related to her      acute sickness.  I do not suspect anything neurological.  There are      no focal deficits.  We will just use Tylenol.  4.  She has a history of depression, for which she is on      antidepressants, which will be continued.  5. Urinary tract infection:  She is on Cipro, which will be continued.   Patient needs to remain in the hospital for treatment of her bronchitis.  I do not think she needs anything more urological to be done, although I  would defer to Dr. Jerre Simon.      Osvaldo Shipper, MD  Electronically Signed    GK/MEDQ  D:  08/14/2008  T:  08/14/2008  Job:  [id number]

## 2011-04-14 NOTE — H&P (Signed)
[name], [name]             ACCOUNT NO.:  [id number]   MEDICAL RECORD NO.:  [id number]          PATIENT TYPE:  INP   LOCATION:  A337                          FACILITY:  [facility]   PHYSICIAN:  Margaretmary Dys, M.D.DATE OF BIRTH:  09/23/1976   DATE OF ADMISSION:  08/12/2008  DATE OF DISCHARGE:  LH                              HISTORY & PHYSICAL   ADMISSION DIAGNOSES:  1. Acute abdominal pain.  2. Acute hydronephrosis  3. Recurrent ureterolithiasis.  4. Status post stent placement in the past.   HISTORY OF PRESENT ILLNESS:  [name] is a 35 year old female who  has significant history for nephrolithiasis and also asthma.  The  patient has had recurrent flank pain due to recurrent bilateral renal  calculi.  Has had stent placed in the past by Dr. Jerre Simon.   The patient had a stent placed over a month ago.  Over the last couple  days, the patient has been having increasing nausea and vomiting and  also bilateral flank pain.  The patient was seen in the emergency room  today and was started on antibiotics.  Urinalysis was also very  suggestive of urinary tract infection.  Abdominal x-rays performed,  however, suggested that stent was in position.  The patient is a patient  of Dr. Jerre Simon, and he will be seeing her in consult tomorrow.  The  patient is currently on pain control and also on IV fluids and seems to  be doing fairly well at this time.  She denies any other symptoms.   REVIEW OF SYSTEMS:  A 10-point review of systems otherwise negative  except as mentioned in history of present illness.   MEDICAL HISTORY:  1. Asthma.  2. Recurrent nephrolithiasis.   MEDICATIONS:  1. Zyprexa 10 mg p.o. once a day.  2. Lexapro 10 mg p.o. once a day.  3. Albuterol inhaler four times a day as needed.   ALLERGIES:  She denies any known drug allergies.   FAMILY HISTORY:  Noncontributory.   SOCIAL HISTORY:  The patient lives in [place] with her daughter.  She is currently  unemployed.  She smokes about a pack of cigarettes a  day.  No history of alcohol or drug abuse.   PHYSICAL EXAMINATION:  GENERAL:  The patient was conscious, alert,  comfortable, not in acute distress.  Was well oriented in time, place  and person.  VITAL SIGNS:  Her blood pressure on arrival in the emergency room was  102/77 with a pulse of 110, respirations 20, temperature 98.8 degrees  Fahrenheit, oxygen saturation was 97% on room air.  HEENT:  Normocephalic, atraumatic.  Oral mucosa was moist with no  exudates.  NECK:  Supple.  No JVD or lymphadenopathy.  LUNGS:  Clear clinically with good air entry bilaterally.  HEART:  S1-S2 regular.  Negative for gallops or rubs.  ABDOMEN:  Soft, nontender.  Bowel sounds positive.  No masses palpable.  EXTREMITIES:  No pitting pedal edema.  No calf induration or tenderness  was noted.  CNS:  Grossly intact.  No focal neurological deficits.   LABORATORY AND DIAGNOSTIC DATA:  Cliffton Asters  blood cell count was 12.4,  hemoglobin of 13.9, hematocrit 40.8, platelet count was 296 with no left  shift.  Sodium 139, potassium 3.6, chloride 106, CO2 27, glucose 95, BUN  of 10, creatinine 0.88, calcium 9.5.  The pregnancy test was negative.  Urinalysis was cloudy with moderate bilirubin and moderate amount of  glucose, positive nitrites and positive leukocytes and many bacteria  seen on microscopy.   ASSESSMENT:  This is a 35 year old female with history of recurrent  bilateral renal calculi status post stent placement.  The patient is  referred by Dr. Jerre Simon.   PLAN:  1. Would continue IV fluids normal saline at 150 ml per our.  2. Request Dr. Jerre Simon to further evaluate her stents tomorrow.  3. Will initiate IV antibiotics with Cipro 400 mg IV q. 12 pending      culture results of Gram stain results.  4. Will put the patient back on albuterol inhalers.  The patient has a      fair amount of wheezing at this time.  5. Will continue the patient's Zyprexa  and Lexapro.  I see no      indication to hold them.   I have discussed the above plan with the patient.  She verbalized full  understanding.      Margaretmary Dys, M.D.  Electronically Signed     AM/MEDQ  D:  08/13/2008  T:  08/13/2008  Job:  [id number]

## 2011-04-14 NOTE — Op Note (Signed)
NAMEDANELL, VAZQUEZ             ACCOUNT NO.:  [id number]   MEDICAL RECORD NO.:  [id number]          PATIENT TYPE:  AMB   LOCATION:  DAY                           FACILITY:  [facility]   PHYSICIAN:  Ky Barban, M.D.DATE OF BIRTH:  1976/11/11   DATE OF PROCEDURE:  08/31/2008  DATE OF DISCHARGE:                               OPERATIVE REPORT   PREOPERATIVE DIAGNOSES:  1. Left ureteral stent.  2. Bladder calculus.  3. Right ureteral calculus.  4. Large wart on right labial fold.   ANESTHESIA:  General.   PROCEDURE:  The patient under general endotracheal anesthesia in  lithotomy position, usual prep and drape.  A #25 cystoscope introduced  into the bladder.  There was a large stone which was encrustation on the  left ureteral stent.  It was visualized and using holmium laser, the  stone was completely removed from the stent.  Stent was removed and the  pieces of stones were evacuated from the bladder.  I used a total of  kJ  1.62  powered with 30 watts.  Energy used 1.5 joules.  Once the bladder  stone was removed and the stent was removed then I was going to put a  stent on the right side because she was having right renal colic.  She  had a right ureteral calculus, but luckily I could see the stone through  the ureteral orifice in the intramural ureter, so I passed a stone  basket through the cystoscope, I engaged the stone in the basket and  removed without any problem.  After removing the stone, I did a  retrograde pyelogram.  A wedge catheter was introduced into the right  ureteral orifice.  Ureter appears slightly dilated, but I did not see  any other stone in the ureter so I decided not to put any stent.  All  the pieces of bladder stones were removed.  Now, I proceeded to remove  the wart from the right labia.  It was simply excised and the site of  the wart was completely fulgurated.  The wart was completely removed  intact.  Complete hemostasis was obtained.  Then, the  skin was closed  with interrupted sutures of 3-0 chromic.  Sterile gauze dressing  applied.  The patient left the operating room in satisfactory condition.       Ky Barban, M.D.  Electronically Signed     MIJ/MEDQ  D:  08/31/2008  T:  09/01/2008  Job:  [id number]

## 2011-04-14 NOTE — Consult Note (Signed)
[name], [name]             ACCOUNT NO.:  [id number]   MEDICAL RECORD NO.:  [id number]          PATIENT TYPE:  OBV   LOCATION:  A338                          FACILITY:  [facility]   PHYSICIAN:  Edward L. Juanetta Gosling, M.D.DATE OF BIRTH:  01-21-1976   DATE OF CONSULTATION:  08/21/2008  DATE OF DISCHARGE:                                 CONSULTATION   PATIENT OF:  [facility].   SUBJECTIVE:  [name] is a 35 year old in consultation that is  requested for asthma.  She came to the emergency room with a kidney  stone and then stated that her asthma and bronchitis started giving her  trouble.  She set for a followup appointment and apparently is supposed  to have a lithotripsy.  She has had kidney stone for about the last 10  years.  She has had a renal stent in place and she has had asthma for  about 4 or 5 years.   FAMILY HISTORY:  Very positive for COPD in one of her family members,  who recently deceased with problems from COPD.   SOCIAL HISTORY:  She smokes about a pack of cigarettes a day.  She is  not using alcohol or any illicit drugs.   REVIEW OF SYSTEMS:  As mentioned is essentially negative.  She states  she cannot get any sputum up.   PHYSICAL EXAMINATION:  GENERAL:  Shows a well-developed, moderately  obese female, who is coughing during the exam, and has a very loose-  sounding cough.  VITAL SIGNS:  Her temperature is 98.2, pulse 76, respirations 20, blood  pressure 134/77, and O2 sat is 95% on room air.  HEENT:  Her pupils are reactive.  Her nose and throat are clear.  NECK:  Supple without masses.  CHEST:  Shows rhonchi and some wheezing bilaterally.  HEART:  Regular.  ABDOMEN:  Soft.  EXTREMITIES:  Showed no edema.  CNS is grossly intact.   LABORATORY DATA:  White count 15,000, hemoglobin is 12.9, and platelets  232.  BMET essentially normal.  Her potassium is 5.2.  Chest x-ray from  August 16, 2008, no acute cardiopulmonary findings.   She  is currently on albuterol, Atrovent, Lexapro, Protonix, Zyprexa,  nafcillin, and pain medication.  She is on Mucinex as well.  I think she  is basically on maximum medical treatment and I do not think there is  anything else to add.   PLAN:  Continue with the treatment and followup.      Edward L. Juanetta Gosling, M.D.  Electronically Signed     ELH/MEDQ  D:  08/21/2008  T:  08/22/2008  Job:  [id number]

## 2011-04-14 NOTE — Op Note (Signed)
[name], STONEBERG             ACCOUNT NO.:  [id number]   MEDICAL RECORD NO.:  [id number]          PATIENT TYPE:  INP   LOCATION:  A332                          FACILITY:  [facility]   PHYSICIAN:  Ky Barban, M.D.DATE OF BIRTH:  [birth year]   DATE OF PROCEDURE:  08/03/2008  DATE OF DISCHARGE:  08/04/2008                               OPERATIVE REPORT   PREOPERATIVE DIAGNOSIS:  Right flank pain.   POSTOPERATIVE DIAGNOSIS:  Normal right retrograde pyelogram.   PROCEDURE:  Cystoscopy right retrograde pyelogram.   DESCRIPTION OF PROCEDURE:  The patient under general anesthesia and  lithotomy position, usual prepped and draped, #25 cystoscope introduced  into the bladder.  It is inspected.  She has a double-J stent to the  left side which has made a lot of encrustation on the distal end which  was in the bladder.  The right ureteral orifice was catheterized with a  wedge catheter and Hypaque is injected under fluoroscopic control.  Dye  goes into the upper ureter, there is no filling defect and the ureter  looks normal.  Renal pelvis fills up, it is normal.  Cystoscope was  removed.  The patient left the operating room in satisfactory condition.      Ky Barban, M.D.  Electronically Signed     MIJ/MEDQ  D:  09/26/2008  T:  09/27/2008  Job:  [id number]

## 2011-04-14 NOTE — Group Therapy Note (Signed)
NAME:  [name], [name]             ACCOUNT NO.:  [id number]   MEDICAL RECORD NO.:  [id number]          PATIENT TYPE:  OBV   LOCATION:  A338                          FACILITY:  [facility]   PHYSICIAN:  Skeet Latch, DO    DATE OF BIRTH:  [birth year]   DATE OF PROCEDURE:  01/19/2008  DATE OF DISCHARGE:                                 PROGRESS NOTE   SUBJECTIVE:  The patient states that she is feeling slightly better  today.  Patient still short of breath with some wheezing, has a  nonproductive cough.  The patient does not complain of any severe  shortness of breath but states that her wheezing has improved as well as  her cough.   OBJECTIVE:  VITAL SIGNS:  Temperature 97.0, pulse 97, respirations 18,  blood pressure 109/56.  She is saturating 91% on room air.  CARDIOVASCULAR:  Regular rate and rhythm.  No thrills, gallops or  murmurs.  LUNGS:  There is bilateral wheezing, right greater than left.  No  rhonchi or rales.  ABDOMEN:  Soft, nontender, nondistended.  Positive bowel sounds.  EXTREMITIES:  No clubbing, cyanosis, or edema.   Labs:  Pending at this time.   ASSESSMENT AND PLAN:  1. Acute asthma exacerbation.  The patient will be continued on      Xopenex and Atrovent as well as IV steroids at this time.  Try to      keep her oxygen saturation above greater than 92%.  The patient      continues to be on antitussives for control of her cough also.      Also we will continue her Advair as previously directed.  The      patient will continue to be followed closely.  2. Hyperglycemia, probably steroid-induced.  We will add NovoLog      sensitive sliding scale and also get a hemoglobin A1c.      Skeet Latch, DO  Electronically Signed     SM/MEDQ  D:  01/19/2008  T:  01/19/2008  Job:  [id number] [id number]

## 2011-04-14 NOTE — Group Therapy Note (Signed)
NAMECARIN, [name] NO.:  [id number]   MEDICAL RECORD NO.:  [id number]          PATIENT TYPE:  INP   LOCATION:  A302                          FACILITY:  [facility]   PHYSICIAN:  Skeet Latch, DO    DATE OF BIRTH:  03/10/1976   DATE OF PROCEDURE:  06/02/2007  DATE OF DISCHARGE:                                 PROGRESS NOTE   SUBJECTIVE:  Patient continues to complain of lower left quadrant pain  with radiation to her left flank.  Patient also is having some right  flank pain which she states has improved today.  Patient also  complaining of some nausea, no obvious vomiting but states that she now  is having a problem with coughing.   OBJECTIVE:  VITAL SIGNS:  Temperature is 98.2, pulse 80, respirations  20, blood pressure is 126/80, patient is sating 98% on room air.  CARDIOVASCULAR:  Regular rate and rhythm, no murmurs, rubs, or gallops.  RESPIRATORY:  Lungs are clear to auscultation bilaterally.  No rales,  rhonchi, or wheezing.  ABDOMEN:  Lower left quadrant pain, __________  patient with some  radiation to her left flank and no rigidity, guarding noted.   LABS:  Sodium is 142, potassium 4.2, chloride 110, CO2 is 27, glucose  91, BUN 3, creatinine 0.72.  So far her blood cultures are negative.   ASSESSMENT:  This is a 35 -year-old Caucasian female presented to the  emergency room complaining of abdominal pain.  Patient was discharged  from the hospital recently for treatment of kidney stones and was under  the care of Dr. Rito Ehrlich.  Patient had a cystoscopy, left uroscopy,  stone extraction recently.  Now patient comes back and complains of left  lower quadrant pain with left flank pain and also complaining of some  right sided pain.  Gastroenterology has been consulted for this  abdominal pain and constipation.  Gastroenterology has deferred to  Urology, recommendations at this time:  Plan for abdominal pain/flank  pain, the patient is on IV antibiotics at  this time and is being  followed by Gastroenterology.  Patient continues to be on IV pain  medications.  We will await Urology recommendations at this time.  1. History of nephrolithiasis as above, patient will be continued to      be followed closely.      Skeet Latch, DO  Electronically Signed     SM/MEDQ  D:  06/02/2007  T:  06/02/2007  Job:  [id number]

## 2011-04-17 NOTE — H&P (Signed)
[name], [name]             ACCOUNT NO.:  [id number]   MEDICAL RECORD NO.:  [id number]          PATIENT TYPE:  OBV   LOCATION:  IC12                          FACILITY:  [facility]   PHYSICIAN:  Ky Barban, M.D.DATE OF BIRTH:  1976-09-02   DATE OF ADMISSION:  03/05/2009  DATE OF DISCHARGE:  04/07/2010LH                              HISTORY & PHYSICAL   CHIEF COMPLAINT:  Right renal colic.   HISTORY:  A 35 years old female, who is well-known to me, has  longstanding history of recurrent renal calculi.  She barely shows up in  the office for followup and present in the ER with a right renal colic.  CT shows there is a 6-mm stone in the right upper ureter.  She was  having pain for several days.  No fever and she was admitted for  observation subsequently, by the next day she passed the stone.   PAST HISTORY:  Please see her old record.   HOSPITAL COURSE:  She was seen in the ER and urinalysis showed ketones,  positive nitrite, esterase negative, and positive for blood.  Sodium  137, potassium 3.3, chloride 106, CO2 is 23, glucose 107, BUN is 14, and  creatinine 1.4.  WBC count is 8.6 and hematocrit 35.8.  She was admitted  for observation and control of pain, started IV fluids or parenteral  analgesia.  Over the next 24 hours, she passed the stone spontaneously,  so it was decided to send her home and I told her that she need to come  back to the office for followup.   FINAL DISCHARGE DIAGNOSIS:  Right ureteral bilateral renal calculi.   DISCHARGE CONDITION:  Improved.   DISCHARGE MEDICATIONS:  None.   She subsequently did have positive urine culture, sensitive to most of  the antibiotics, and it was a low colony count.  I want to see her back  in the office and then I can probably do another culture.      Ky Barban, M.D.  Electronically Signed     MIJ/MEDQ  D:  06/05/2009  T:  06/06/2009  Job:  [id number]

## 2011-04-17 NOTE — Discharge Summary (Signed)
[name], [name]             ACCOUNT NO.:  [id number]   MEDICAL RECORD NO.:  [id number]          PATIENT TYPE:  INP   LOCATION:  A311                          FACILITY:  [facility]   PHYSICIAN:  Osvaldo Shipper, MD     DATE OF BIRTH:  05/08/1976   DATE OF ADMISSION:  11/25/2006  DATE OF DISCHARGE:  01/03/2008LH                               DISCHARGE SUMMARY   Patient does not have a primary care doctor.  She has been assigned to  the unassigned physician for November 25, 2006.   DISCHARGE DIAGNOSES:  1. Acute bronchitis, improved.  2. Obesity.  3. Tobacco abuse, counseled.   Please review H&P dictated by Dr. Sherle Poe for details regarding  patient's presenting illness.   BRIEF HOSPITAL COURSE:  This is a 35 year old Caucasian female who is  obese who has really no medical problems and who lives in [place],  [place], with her children and with her mother.  About 3-4 days  prior to the day of admission she came in complaining of wheezing and  shortness of breath.  She was diagnosed with bronchitis and was sent  home from the ED on Zithromax.  Patient, however, did not improved and  returned on November 25, 2006, and was admitted to the hospital.  Patient has been getting nebulizer treatments, steroids and also  received antibiotics.  Patient slowly improved.  She took a while to  show some improvement.  She currently still has some wheezing but she is  able to ambulate and she is saturating more than 95% on room air.  Patient also feels improvement and feels that she is ready to go home.  Although she is not back to her baseline, I think the rest of the  treatment can be continued at home.  She was strongly counseled to quit  smoking.  Smoking cessation classes were also recommended to her.   On the day of discharge, as mentioned above, patient was feeling better,  though not back to baseline.  She was saturating well.  Her vital signs  were all stable and normal.   Her blood work showed leukocytosis with a  white count of 22,000 which is also somewhat stable and possibly related  to prednisone use.  Her ABG on admission was 7.43, 29, 84, 10 and 97%.  Rest of her labs were unremarkable.  Considering overall stability, I  think the patient may go home.  She was also given a note for work.  She  was asked not to return to work before December 06, 2006.   DISCHARGE MEDICATIONS:  1. Albuterol and Atrovent nebulizers to be used every four hours for      the first five days while awake and then every six hours for the      next five days and then three times a day.  2. Prednisone 40 mg x5 days, 20 mg x5 days, then 10 mg x5 days.  3. Levaquin 500 mg x5 days.   She was asked not to use her albuterol inhaler while she was using the  nebulizers.   She was also  having some constipation for which she was asked to take  Colace from the pharmacy over-the-counter.  She was also given one dose  of Dulcolax 5 mg before discharge.   FOLLOW UP:  1. Patient is unassigned.  She has been assigned to the physician for      November 25, 2006.  Unfortunately, I do not know who that is at      this point.  Follow up in one week.  2. Outpatient pulmonary function tests to be done in 4-6 weeks when      the patient is back to baseline.   Patient was also written a prescription for nebulizer machine which was  arranged for this through case manager.   DIET:  Regular.   ACTIVITY:  Increase activity slowly, otherwise no restrictions.   TOTAL TIME SPENT AT DISCHARGE:  About 40 minutes.      Osvaldo Shipper, MD  Electronically Signed     GK/MEDQ  D:  12/02/2006  T:  12/02/2006  Job:  [id number]

## 2011-04-17 NOTE — Consult Note (Signed)
NAME:  [name], [name]                       ACCOUNT NO.:  [id number]   MEDICAL RECORD NO.:  [id number]                   PATIENT TYPE:  INP   LOCATION:  0471                                 FACILITY:  [facility]   PHYSICIAN:  Maretta Bees. Vonita Moss, M.D.             DATE OF BIRTH:  1975/12/11   DATE OF CONSULTATION:  02/17/2004  DATE OF DISCHARGE:                                   CONSULTATION   I was called by Dr. Susy Manor to evaluate this is a 35 year old white  female because of severe left flank pain that has not been well relieved  despite morphine, Toradol, Dilaudid, and Phenergan. She has had a past  history of stones and more recently underwent a left ureteroscopy for  ureteral stones four days ago by Dr. Marcelyn Bruins. A stent was left up and  she was supposed to go to the office this week and have it removed. She was  brought to the ER today by ambulance because of increased left flank pain.  She had some hematuria that has cleared. She had some frequency of  urination. She denies chills and fever.   CT scan showed some left hydronephrosis, but no renal calculi and stent was  in good position. No evidence of any perirenal inflammation or fluid  collection. Some incidental small nodules were noted in the chest, in the  upper aspects of this abdominal CT scan.   PAST MEDICAL HISTORY:  She is in general good health.   She is on no regular medications, but she used Tylox recently for pain.   Tobacco: Three cigarettes a day. Alcohol: None.   REVIEW OF SYSTEMS:  She had recent cough and cold, but that has gone. No  chills or fevers as noted above. No vomiting today. No other acute  abnormalities.   FAMILY HISTORY:  Noncontributory.   PHYSICAL EXAMINATION:  GENERAL: A young white female in distress,  complaining of pain. She is alert and oriented.  NECK: Supple.  CHEST: Clear. The left CVA is guarded.  VITAL SIGNS: Blood pressure 117/85, pulse 106, respiratory rate 20,  temperature 98.4.   IMPRESSION:  1. Left flank pain, which may be due to her stent. Rule out infection.     Culture will be done of her urine. I will give her Ancef IV and she will     need 23-hour observation to keep her pain under control.  2. Lower lung nodules that are probably benign, but per radiologist she     should have a CT scan of the chest.                                               Maretta Bees. Vonita Moss, M.D.    LJP/MEDQ  D:  02/17/2004  T:  02/19/2004  Job:  [id number] 

## 2011-04-17 NOTE — H&P (Signed)
NAMEGEORGIAN, [name]             ACCOUNT NO.:  [id number]   MEDICAL RECORD NO.:  [id number]          PATIENT TYPE:  INP   LOCATION:  A310                          FACILITY:  [facility]   PHYSICIAN:  Dennie Maizes, M.D.   DATE OF BIRTH:  1976-02-13   DATE OF ADMISSION:  01/30/2007  DATE OF DISCHARGE:  LH                              HISTORY & PHYSICAL   CHIEF COMPLAINT:  Severe left flank pain radiating to the front, nausea  and vomiting.   HISTORY OF PRESENT ILLNESS:  This is a 35 year old female who has a past  history of recurrent urolithiasis.  She has had several hospitalizations  and procedures of kidney stones.  She was last hospitalized  December2007 at [facility].   She complains of intermittent left flank pain radiating to the front  since yesterday.  This was assessed with nausea and vomiting.  She also  has mild pain on the right side.  The patient denied having any fever,  chills, voiding difficulty or gross hematuria at present.  She came to  the emergency room at [facility].  Microscopic hematuria was  noted.  She was evaluated with a noncontrast CT scan of the abdomen and  pelvis.  This revealed bilateral renal calculi without obstruction.  There are medullary calcifications of the right side suggestive of  medullary nephrocalcinosis.  Large calculi were noted in the lower pole  of the right kidney measuring about 9 mm in size.  There was left-sided  hydronephrosis and secondary obstructive signs.  The patient had  punctate left renal calculi.  She had a 6 mm size stone in the mid  ureter on the left side with obstruction and hydronephrosis.  The  patient's pain was not adequately controlled in the emergency room.  She  also had intermittent nausea and vomiting.  She was admitted to hospital  for pain control and further treatment.   PAST MEDICAL HISTORY:  1. History of recurrent urolithiasis.  2. History of bronchial asthma.  3. Status post  cholecystectomy.  4. Status post tubal ligation.  5. Morbid obesity.   MEDICATIONS:  None.   ALLERGIES:  VICODIN and MORPHINE.   PHYSICAL EXAMINATION:  GENERAL:  The patient is comfortable after  receiving IV Dilaudid.  HEENT:  Normal  LUNGS:  Clear to auscultation.  HEART:  Regular rate and rhythm.  No murmurs.  ABDOMEN:  No palpable flank mass.  Moderate left costovertebral  tenderness was noted.  Bladder not palpable.   ADMISSION LABORATORY DATA:  Urinalysis: Blood small, nitrate negative,  leukocyte esterase trace, wbc's 11-25,  rbc's 7-10 per high-power field,  bacteria many.  CBC:  WBC 9.9, hemoglobin 13.3, hematocrit 38.7,  BUN  10, creatinine 0.72.  Electrolytes is within normal range.   IMPRESSION:  Left mid ureteral calculus with obstruction, left  hydronephrosis, bilateral renal calculi, possible urinary tract  infection.   PLAN:  1. Admit the patient to the hospital.  2. IV fluids.  3. Parenteral narcotics.  Will use a Dilaudid PCA.  4. Urine culture and sensitivity.  5. Will start the patient on  Cipro.  6. Will check x-ray of the KUB area.  7. Dr. Jerre Simon will see the patient today.      Dennie Maizes, M.D.  Electronically Signed     SK/MEDQ  D:  01/31/2007  T:  01/31/2007  Job:  [id number]   cc:   Ky Barban, M.D.  Fax: [phone]

## 2011-04-17 NOTE — H&P (Signed)
NAMEMELLONIE, GUESS             ACCOUNT NO.:  [id number]   MEDICAL RECORD NO.:  [id number]          PATIENT TYPE:  INP   LOCATION:  A304                          FACILITY:  [facility]   PHYSICIAN:  Ky Barban, M.D.DATE OF BIRTH:  October 21, 1976   DATE OF ADMISSION:  07/28/2007  DATE OF DISCHARGE:  09/01/2008LH                              HISTORY & PHYSICAL   CHIEF COMPLAINT:  Recurrent right renal colic.   The patient presented in the emergency room with a right renal colic.  This is her second episode.  She was in the emergency room a few days  ago, last Tuesday.  CT showed bilateral small renal calculi, 3 mm stone  in the bladder.  She had ESL for the right renal calculus 2 weeks before  that.  No history of nausea or vomiting, but she was having severe pain  This Tuesday, but no stone was seen in the ureter.  One 3 mm stone in  the bladder was seen.  Because of this pain, I admitted her in the  hospital.   Please refer to her old record.   PHYSICAL EXAMINATION:  GENERAL:  Moderately built, in severe distress.  VITAL SIGNS:  Blood pressure 130/80, temperature is normal.  CENTRAL NERVOUS SYSTEM:  Negative.  NECK:  Neck veins are negative.  CHEST:  Symmetrical.  HEART:  Regular sinus rhythm, no murmur.  ABDOMINAL:  Soft, flat.  Liver, spleen, kidneys not palpable.  No CVA  tenderness   IMPRESSION:  Right renal colic post ESL.   PLAN:  IV fluids and parenteral analgesia.      Ky Barban, M.D.  Electronically Signed     MIJ/MEDQ  D:  09/07/2007  T:  09/08/2007  Job:  [id number]

## 2011-04-17 NOTE — Group Therapy Note (Signed)
NAMEEDANA, AGUADO             ACCOUNT NO.:  [id number]   MEDICAL RECORD NO.:  [id number]          PATIENT TYPE:  INP   LOCATION:  A311                          FACILITY:  [facility]   PHYSICIAN:  Margaretmary Dys, M.D.DATE OF BIRTH:  1976-02-17   DATE OF PROCEDURE:  11/28/2006  DATE OF DISCHARGE:                                 PROGRESS NOTE   SUBJECTIVE:  The patient feels slightly better.  She is still having a  cough with extensive wheezing.  She also has chest tightness.  She  denies any fever or chills.   OBJECTIVE:  GENERAL:  Conscious, alert, comfortable.  The patient still  remains in mild respiratory distress with audible wheezing.  VITAL SIGNS:  Blood pressure 124/64.  Pulse 78.  Respirations 24.  T-max  98.5.  Oxygen saturation 98% on room air.  HEENT EXAM:  Normocephalic, atraumatic.  Oral mucosa was moist with no  exudates.  NECK:  Supple.  No JVD or lymphadenopathy.  LUNGS:  Show bilateral rhonchi with diffuse expiratory wheezes.  HEART:  S1, S2 regular.  no gallops, no rubs.  ABDOMEN:  Soft, nontender.  Bowel sounds positive.  EXTREMITIES:  No edema.   LABORATORY DATA:  1. White blood cell count was 15.6.  Hemoglobin 12.  Hematocrit 35.3.      Platelet count was 286 with no left shift.  2. Sodium 140.  Potassium 3.9.  Chloride 107.  CO2 26.  Glucose 101.      BUN 15.  Creatinine 0.8.  Potassium 8.4.   ASSESSMENT AND PLAN:  Asthmatic bronchitis.  The patient is still having  wheezing with rhonchi bilaterally; although, this is l improved from  previous.  Will discontinue Solu-Medrol and switch her to Prednisone 60  mg once a day.  Will also change antibiotic Zithromax to 250 mg orally  once a day.  Will continue with nebulizations as currently planned.  I  do anticipate the patient will continue to show improvement and may be  ready to go home in the next 1-2 days.  Will continue GI prophylaxis  with Protonix and DVT prophylaxis with Lovenox.      Margaretmary Dys, M.D.  Electronically Signed     AM/MEDQ  D:  11/28/2006  T:  11/28/2006  Job:  [id number]

## 2011-04-17 NOTE — Op Note (Signed)
[name], [name]                       ACCOUNT NO.:  [id number]   MEDICAL RECORD NO.:  [id number]                   PATIENT TYPE:  INP   LOCATION:  0378                                 FACILITY:  [facility]   PHYSICIAN:  Jamison Neighbor, M.D.               DATE OF BIRTH:  1976-06-03   DATE OF PROCEDURE:  02/13/2004  DATE OF DISCHARGE:  02/14/2004                                 OPERATIVE REPORT   PREOPERATIVE DIAGNOSIS:  Left ureteral calculus.   POSTOPERATIVE DIAGNOSIS:  Left ureteral calculus.   PROCEDURE:  Cystoscopy, left retrograde, left ureteroscopy, left in situ  laser lithotripsy, left double J catheter insertion.   SURGEON:  Jamison Neighbor, M.D.   ANESTHESIA:  General.   COMPLICATIONS:  None.   DRAINS:  A 6 French x 26 cm double J.   HISTORY:  This 35 year old female was admitted to the hospital today for  pain management and removal of a kidney stone.  The patient is now to  undergo ureteroscopy with extraction if possible.  The patient understands  the risks and benefits of the procedure, gave full informed consent.   DESCRIPTION OF PROCEDURE:  After successful induction of general anesthesia  the patient was placed in the dorsal lithotomy position, prepped with  Betadine, draped in usual sterile fashion.  Cystoscopy was performed.  The  left ureteral orifice was identified and retrograde study was performed.  This showed a stone in the a ureter with hydronephrosis above.  A guide wire  was passed up to the kidney.  The ureteroscope was advanced up to the stone.  It was grasped, but was too large to remove.  Thus, the stone was disengaged  and the basket was exchanged for a laser fiber.  The stone was fragmented  and one of the larger pieces was removed, the rest were all flushed out, and  a few small pieces were left where they would easily passed.  The patient  had a double J catheter passed up to the kidney where it  ______________normal within the pelvis as  well as in the bladder.  The  patient's bladder was drained.  She tolerated the procedure, was taken to  the recovery room in good condition.  She will be kept overnight and be sent  home in the morning.                                               Jamison Neighbor, M.D.    RJE/MEDQ  D:  02/13/2004  T:  02/14/2004  Job:  [id number]

## 2011-04-17 NOTE — Discharge Summary (Signed)
NAMEQUEENIE, AUFIERO             ACCOUNT NO.:  [id number]   MEDICAL RECORD NO.:  [id number]          PATIENT TYPE:  INP   LOCATION:  A332                          FACILITY:  [facility]   PHYSICIAN:  Ky Barban, M.D.DATE OF BIRTH:  03-25-1976   DATE OF ADMISSION:  07/30/2008  DATE OF DISCHARGE:  09/05/2009LH                               DISCHARGE SUMMARY   A 35 year old female is well known to me has a history of having  nephrolithiasis and S1 depression.  She came to the emergency room with  right flank pain, nausea, vomiting.  No fever or chills.  She has a  stone on the left side and I have inserted a double-J stent on the left  side, by that she is hurting on the right side.  She is scheduled to  have ESL for left renal calculus, but she never came back for treatment.   PAST MEDICAL HISTORY:  Please review her old record.   PHYSICAL EXAMINATION:  GENERAL:  Moderately obese, not in acute  distress, fully conscious, alert, oriented.  VITAL SIGNS:  Blood pressure 120/80, temperature is normal.  CENTRAL NERVOUS SYSTEM:  Negative.  ABDOMEN:  Soft, flat.  Liver, spleen, kidneys are not palpable.   LABORATORY DATA:  Her hematocrit is 40.3, WBC count is 7000.  CT abdomen  and pelvis is done, shows bilateral nephrolithiasis, no stone in the  left ureter.   She was started on IV fluid with IV Levaquin and she was continued to  having nausea, so I discontinued her Dilaudid and started her on Toradol  30 mg IV q.6 h.  Next day, August 01, 2008, still complains of pain in  both sides but worse pain on the right side.  She does have an 8-mm  stone in the right renal pelvis, which is causing no obstruction, so I  decided to do a retrograde pyelogram.  On August 02, 2008, taken to  the operating room.  A retrograde pyelogram was done, it was normal.  The urine culture, which was done in the office is growing staph and it  is resistant to Levaquin, so I changed her antibiotic.  I am  going to  put her on gentamicin, and over the next 24 hours, she is feeling  better, so she is being discharged home.  We will follow her in the  office, so we can treat her left renal calculus with ESL.   FINAL DISCHARGE DIAGNOSES:  1. Right flank pain, probably right pyelonephritis and left renal      calculi.  2. Asthma.  3. Depression.   She was advised to continue her usual medicines, and I will see her back  next week in the office.      Ky Barban, M.D.  Electronically Signed     MIJ/MEDQ  D:  09/18/2008  T:  09/19/2008  Job:  [id number]

## 2011-04-17 NOTE — Discharge Summary (Signed)
NAMEKERYN, [name]             ACCOUNT NO.:  [id number]   MEDICAL RECORD NO.:  [id number]          PATIENT TYPE:  INP   LOCATION:  A310                          FACILITY:  [facility]   PHYSICIAN:  Ky Barban, M.D.DATE OF BIRTH:  [birth year]   DATE OF ADMISSION:  01/30/2007  DATE OF DISCHARGE:  03/05/2008LH                               DISCHARGE SUMMARY   CHIEF COMPLAINT:  Severe left flank pain.   HOSPITAL COURSE:  A 35 year old female, well-known to me, presented in  the emergency room with left renal colic.  CT scan showed there is a 60  mm of stone in the left ureter causing hydroureter nephrosis.  She has  stones in both kidneys and has been treated for a stone with lithotripsy  in the past.  She has cluster of stones small in the lower pole calix on  the right side.  She also has small calculi in the left kidney.  No  stone in the right ureter.  She was started on IV fluids and analgesia.  Over the next 24 hours, she passed the stone from the left side  spontaneously.  She was still having some nausea and vomiting and  realized that it is probably because of Dilaudid she was taking which  was discontinued and she felt better.  KUB was done.  There was no stone  in the right ureteral area.  The pain in the right flank also subsided.  At this point, I am going to discharge her home and will see her back in  the office in 2 weeks.   LABORATORY DATA AND X-RAY FINDINGS:  She had CBC done on March 2, with  WBC 9.9, hematocrit 38.7.  Sodium was 137, potassium 3.6, chloride 104,  CO2 is 26, chloride 94.  Urinalysis does show on admission many wbc's.  Urine culture subsequently showed no growth.  We will see her back in 2  weeks in the office.   DISCHARGE DIAGNOSES:  1. Bilateral small renal calculi.  2. Left ureteral calculus.   DISCHARGE MEDICATIONS:  1. Percocet one q.6 h. p.r.n., #30.  2. I will see her back in the office in 2 weeks.      Ky Barban, M.D.  Electronically Signed     MIJ/MEDQ  D:  03/03/2007  T:  03/03/2007  Job:  [id number]

## 2011-04-17 NOTE — Group Therapy Note (Signed)
[name], HENNER NO.:  [id number]   MEDICAL RECORD NO.:  [id number]          PATIENT TYPE:  INP   LOCATION:  A311                          FACILITY:  [facility]   PHYSICIAN:  Gardiner Barefoot, MD    DATE OF BIRTH:  22-Sep-1976   DATE OF PROCEDURE:  11/29/2006  DATE OF DISCHARGE:                                 PROGRESS NOTE   SUBJECTIVE:  Patient continues to note significant wheezing and a cough.  She also continues to have some chest tightness but no fevers or chills.   OBJECTIVE:  VITAL SIGNS:  Temperature 97.6, pulse 84, respirations 20,  blood pressure 106/77.  She is breathing 95% on 2 liters oxygen.  GENERAL:  The patient is awake, alert and oriented x3 and appears in no  acute distress.  Patient does have audible wheezing.  HEENT:  Mucous membranes are moist.  HEART:  Regular rate and rhythm with no murmurs, rubs or gallops.  LUNGS:  Diffuse expiratory wheezes.  ABDOMEN:  Obese.  Soft, nontender, nondistended.  Positive bowel sounds.  No hepatosplenomegaly appreciated.  EXTREMITIES:  No edema.   LABORATORY DATA:  White count is 12.8, hemoglobin 12.7.  Sodium 137,  potassium 4.4, chloride 104, bicarb 28, glucose 93, BUN 13, creatinine  0.76, calcium 8.5.   ASSESSMENT/PLAN:  Asthmatic exacerbation:  Patient continues to have  significant wheezing and still has oxygen requirement.  Therefore, we  will continue the patient on 60 mg of prednisone and therapy as needed.  Patient also is on Zithromax for presumed bronchitis.  It was discussed  with the patient extensively that exacerbation is likely secondary to  smoking.  Whether or not she feels that she smokes a significant amount,  it is effecting her lungs significantly, and she was advised that she  will need to quit smoking 100%.  Patient did voice understanding.  At  this time, we will continue the patient on p.o. steroids, the  antibiotics, as well as a the nebulized treatments.  A chest x-ray was  done again today to assess if there was any developing pneumonia;  however, it was within normal limits, and doubt any pneumonia is  developing.  It just appears that she is having a difficult time  clearing the wheezing.   DISPOSITION:  Patient will continue on GI prophylaxis and DVT  prophylaxis and likely will be able to go home in the next 1-2 days.      Gardiner Barefoot, MD  Electronically Signed     RWC/MEDQ  D:  11/29/2006  T:  11/29/2006  Job:  [id number]

## 2011-04-17 NOTE — H&P (Signed)
[facility]  Patient:    [name], [name] Visit Number: [id number] MRN: [id number]          Service Type: Attending:  Alleen Borne, M.D. Dictated by:   Alleen Borne, M.D. Adm. Date:  06/06/02                           History and Physical  CHIEF COMPLAINT:  Right renal calculi.  HISTORY OF PRESENT ILLNESS:  This 35 year old white female was recently brought to the hospital with right renal colic. Workup showed that she had an 8 mm stone in the right upper ureter causing hydronephrosis.  She was taken to the operating room and a double-J stent was placed.  The stone is in the right renal pelvis, and there were a couple of other small calculi along with it.  I have advised her to undergo ESL.  The patient was coming to outpatient to have it done.  There is a possibility that she has a distal right ureteral calculus but there is no hydronephrosis on that side.  PAST MEDICAL HISTORY:  A couple of years ago she underwent stone basket on the right side.  No other medical problems.  ALLERGIES:  CODEINE.  MEDICATIONS:  None.  REVIEW OF SYSTEMS:  Unremarkable.  PHYSICAL EXAMINATION:  VITAL SIGNS:  Blood pressure 140/90.  GENERAL:  Moderately obese, not in acute distress.  Fully conscious. Alert and oriented.  CENTRAL NERVOUS SYSTEM:  Negative.  HEAD/NECK/ENT:  Negative.  CHEST:  Clear.  HEART:  Regular sinus rhythm.  ABDOMEN:  Soft, flat.  Liver, spleen and kidneys are not palpable.  No CVA tenderness.  PELVIC:  Exam unremarkable.  IMPRESSION:  Right renal calculi, possible left ureteral calculus.  PLAN:  ESL of right renal calculi. Dictated by:   Alleen Borne, M.D. Attending:  Alleen Borne, M.D. DD:  06/06/02 TD:  06/06/02 Job: [id number]

## 2011-04-17 NOTE — H&P (Signed)
NAMESIDRAH, [name]             ACCOUNT NO.:  [id number]   MEDICAL RECORD NO.:  [id number]          PATIENT TYPE:  INP   LOCATION:  A311                          FACILITY:  [facility]   PHYSICIAN:  Margaretmary Dys, M.D.DATE OF BIRTH:  1976/09/22   DATE OF ADMISSION:  11/25/2006  DATE OF DISCHARGE:  LH                              HISTORY & PHYSICAL   PRIMARY CARE PHYSICIAN:  The patient is unassigned.   ADMITTING DIAGNOSES:  1. Asthmatic bronchitis.  2. Generalized myalgia.  3. Morbid obesity.   CHIEF COMPLAINT:  Cough, shortness of breath, and wheezing of 3 days'  duration.   HISTORY OF PRESENT ILLNESS:  [name] is a 35 year old Caucasian  female who presented to the emergency room with complaints of cough and  shortness of breath which started about 3 days ago.  The patient reports  that the cough has been mostly dry with no sputum.  She has had  generalized myalgia with difficulty laying flat and walking.  She also  reports some pleuritic chest pain mostly on the right side.  She says  that she has some fevers, also with chills and rigors.  The patient was  seen in the emergency room on Christmas Day, November 23, 2006 with  similar symptoms.  She was told that she had bronchitis and was  discharged home on Zithromax and also prednisone and albuterol inhalers.  The patient reports that this has not helped her.  She was also put on  guaifenesin.  The patient states that she is not a known asthmatic, but  had a similar episode in March of this year and was admitted to a  hospital in [place] where she spent 2 weeks.  She says that during the  course of that hospitalization it was complicated by allergic reaction  to morphine.  She describes similar symptoms of cough, shortness of  breath with wheezing.  In between that episode and now she denies any  concurrent symptoms of shortness of breath.   Evaluation in the emergency room did not reveal any evidence of  pneumonia on  chest x-ray, but the patient was diffusely wheezing despite  receiving the bronchodilator treatments.  She was not hypoxic though.  Decision was made to admit her to the hospital due to this refractory,  acute exacerbation of asthma.   REVIEW OF SYSTEMS:  Temporary review of systems otherwise negative  except as mentioned in history of present illness.   PAST MEDICAL HISTORY:  1. History of asthmatic bronchitis back in March 2007.  2. Kidney stones.  3. Morbid obesity.  4. Neuropathy.   MEDICATIONS:  The patient reports not taking any medications other than  the one that she was prescribed 2 days ago.   ALLERGIES:  Possibly MORPHINE as she broke into hives back then.   FAMILY HISTORY:  Positive for asthma and hypertension.   SOCIAL HISTORY:  The patient denies any alcohol use.  She smokes about a  pack of cigarettes in about 3 days.  She denies any illicit drug use.  She has three children.  She is single.  She is  not on disability.  She  currently works at [employer].   No recent history of travel.  No history of sick exposure.  The patient  says that she received a flu shot this fall.   PHYSICAL EXAMINATION:  GENERAL:  Conscious, alert, in respiratory  distress and audibly wheezing.  HEENT EXAM:  Normocephalic, atraumatic.  Oral mucosa was moist.  NECK:  Supple.  No JVD and no lymphadenopathy.  LUNGS:  Bilateral expiratory wheezes with rhonchi.  The patient is  fairly tachypneic.  ABDOMEN:  Soft, obese, nontender.  Bowel sounds are positive.  EXTREMITIES:  No edema.  No induration or tenderness was noted.  CENTRAL NERVOUS SYSTEM EXAM:  Grossly intact with no focal neurological  deficits.  VITAL SIGNS:  On arrival in the emergency room her blood pressure was  108/89 with a respiratory rate of 28, temperature was 97.7, oxygen  saturation was 95% on room air.   LABORATORY/DIAGNOSTIC DATA:  Blood gas showed a pH of 7.43, pCO2 of 29,  bicarbonate of 19.2.  Oxygen saturation was  97.7%.  This blood gas is  consistent with a mild respiratory alkalosis.  White blood cell count  was 11.7, hemoglobin 13.6, hematocrit 40.4, platelet count was 276 with  no left shift.  Sodium 138, potassium 3.5, chloride 105, CO2 of 23,  glucose of 100.  BUN 12, creatinine 0.8, calcium is 8.8.  BNP was less  than 30.  Pregnancy test was negative.  Urinalysis was negative other  than for moderate blood; bacteria was few; on macroscopy with 7-10 white  blood cells, nitrites were negative.   CHEST X-RAY:  Showed no acute infiltrates or cardiac disease.  Chest x-  ray was compared to 2 days ago which also was unremarkable.   ASSESSMENT/PLAN:  1. The patient is a 35 year old Caucasian female who presents with      increasing cough, shortness of breath and wheezing for the past 3      days.  Symptoms are consistent with asthmatic bronchitis.  It very      likely that the patient has underlying asthma which has been      precipitated by this acute viral episode.  Our plan is to admit her      at this time.  We will continue antibiotics with Zithromax 500 mg      IV once a day due to the patient's symptoms of fever with a      temperature of 102 at home.  We will put her on Solu-Medrol 125 mg      IV q.6 h. and also albuterol nebulizers q.4 h. scheduled and q.2 h.      p.r.n. We will put her on DVT prophylaxis with Lovenox GI      prophylaxis with Protonix.  2. Morbid obesity.  We will discuss with the patient when she is a lot      better about weight loss.  3. The patient is not in any imminent respiratory failure at this      time.  We will continue to monitor her closely on      the regular floor for now, but will transfer her to telemetry or      intensive care unit if she shows progressive shortness of breath      with  hypoxemia or altered mental status to suggest that she is      retaining CO2.  I have discussed the above plan with the patient  and she verbalized full  understanding.      Margaretmary Dys, M.D.  Electronically Signed     AM/MEDQ  D:  11/26/2006  T:  11/26/2006  Job:  [id number]

## 2011-04-17 NOTE — Discharge Summary (Signed)
[name], [name]             ACCOUNT NO.:  [id number]   MEDICAL RECORD NO.:  [id number]          PATIENT TYPE:  INP   LOCATION:  A304                          FACILITY:  [facility]   PHYSICIAN:  Ky Barban, M.D.DATE OF BIRTH:  1976/04/02   DATE OF ADMISSION:  07/28/2007  DATE OF DISCHARGE:  09/01/2008LH                               DISCHARGE SUMMARY   This 34 year old female with multiple renal calculi was admitted on  July 28, 2007, with right renal colic.  She was seen in the emergency  room on past Tuesday.  CT scan did not show any stone in the ureter and  there was a small 3-mm stone in the bladder, so apparently it was  thought that she passed that stone.  She comes back with right renal  colic so I admitted her for control of pain.   HOSPITAL COURSE:  Routine admission workup, CBC, urinalysis, SMA-7 is  done which is normal.  A CT scan is done again today, which is August  28, and report is as following:  There is a 5- to 6-mm stone in the  proximal right ureter causing moderate right hydronephrosis.  This stone  was previously located in the right lower pole renal calculus on the  right side.  After she had lithotripsy this stone has moved into the  ureter.  On August 29 she passed a small stone.  She is not vomiting any  more, was started on a diet.  She continued to do well over the next  couple of days.  On September 1 she is afebrile, feels better, no pain  or nausea, so she was discharged home.  I do not know if she passed that  5-mm stone or some other stone, but after she passed it she became  asymptomatic.  She is supposed to come back and see me in the office on  __________ .  She is given Percocet one q.6h. p.r.n. #30.  She is  advised to continue her albuterol.      Ky Barban, M.D.  Electronically Signed     MIJ/MEDQ  D:  09/07/2007  T:  09/08/2007  Job:  [id number]

## 2011-04-17 NOTE — Discharge Summary (Signed)
NAME:  [name], [name]             ACCOUNT NO.:  [id number]   MEDICAL RECORD NO.:  [id number]          PATIENT TYPE:  INP   LOCATION:  A322                          FACILITY:  [facility]   PHYSICIAN:  Ky Barban, M.D.DATE OF BIRTH:  [birth year]   DATE OF ADMISSION:  08/26/2008  DATE OF DISCHARGE:  09/30/2009LH                               DISCHARGE SUMMARY   A 35 years old female with history of multiple renal calculi, several  admissions of desperation because of renal calculi, presented in the  emergency room with right flank pain and she was admitted by Dr.  Rito Ehrlich for further management and workup.  She was scheduled to have a  left renal ESL because she has a stone in the left kidney and with a  double-J stent, but she never came back for ESL.   HOSPITAL COURSE:  She has a routine admission workup done.  Her lab  data, WBC count is 19.7, hematocrit is 41.7, sodium 139, potassium 4.4,  chloride 109, CO2 is 24.  Urine culture shows no growth.  She was  started on IV fluids and n.  KUB shows the left ureteral stent and left  renal calculus stone in the area of the right upper ureter.  She was  running low-grade fever on admission, but subsequently her temperature  came down to normal, so I am going to send her home, and she is already  scheduled to have ESL done, and she was treated with IV Levaquin during  this hospital stay.  She is being discharged, will be followed up by me  in the office.  As I said, she is already scheduled to have ESL done for  left renal calculus, and if she continued to have pain and on the right  side, then she may need ESL for the right renal calculus also.   DISCHARGE DIAGNOSES:  1. Right ureteral and left renal calculi.  2. Depression.   DISCHARGE MEDICATIONS:  Continue her Zyprexa and Lexapro.  I also gave  her Percocet and Bactrim DS for 7 days one twice a day, Percocet 30  tablets one q.6h. as p.r.n.   I will see her back in the office next  week.       Ky Barban, M.D.  Electronically Signed     MIJ/MEDQ  D:  09/18/2008  T:  09/19/2008  Job:  [id number]

## 2011-04-17 NOTE — Discharge Summary (Signed)
NAME:  [name], [name]                       ACCOUNT NO.:  [id number]   MEDICAL RECORD NO.:  [id number]                   PATIENT TYPE:  INP   LOCATION:  0471                                 FACILITY:  [facility]   PHYSICIAN:  Jamison Neighbor, M.D.               DATE OF BIRTH:  10-22-1976   DATE OF ADMISSION:  02/17/2004  DATE OF DISCHARGE:  02/20/2004                                 DISCHARGE SUMMARY   DISCHARGE DIAGNOSES:  Viral syndrome.   HISTORY:  This is a 35 year old female who is status post ureteroscopy and  stent placement on February 14, 2004.  The patient's stones were fragmented and  removed at that time.  The patient became ill over the next 2 days, and  presented to the emergency room on February 17, 2004.  The patient felt like  she had a return of her flank pain.  A CT scan was obtained, which showed  the stent was in place, and there was no hydronephrosis or any evidence of  complications from the ureteroscopy.  Dr. Vonita Moss was on call for me, and  admitted the patient for pain control.   PAST MEDICAL HISTORY:  Unremarkable.  She has no medical problems.   MEDICATIONS:  She takes no medication on a regular basis, but has been on  Tylox for pain.   SOCIAL HISTORY:  The patient does not use alcohol, and smokes very modest  amounts, approximately 3 cigarettes a day.   The patient has had a recent cough and cold.  The patient denied vomiting at  the time of admission, but then subsequently admitted that she had had some  nausea.  The patient was admitted for evaluation of her left flank pain,  which Dr. Vonita Moss thought might be due to her stent.  He started her on  Ancef and placed her on 23-hour observation.   HOSPITAL COURSE:  The patient was afebrile.  Vital signs were acceptable.  Her urine was nitrite negative.  A KUB was obtained in order to evaluate the  stent placement, and showed it was in perfect position.  A CT scan of the  chest was done, since there had been  a question of pulmonary nodules.  The  patient was found to have a 5 mm nodule in the left lower lobe, and was felt  to be otherwise normal.  The patient had some nausea, felt to be secondary  to her morphine.  In addition, the patient stated that she thought she was  developing the flu.  Her appetite remained poor.  Her PCA was discontinued,  she was kept on IV fluids, and her nausea seemed to get better.  The patient  was discharged on February 20, 2004, with plans to remove the stent the next  week.  It appears that the patient never had a problem with her kidney, and  that the reason for admission at this time was  due to a viral syndrome.                                               Jamison Neighbor, M.D.    RJE/MEDQ  D:  03/07/2004  T:  03/07/2004  Job:  [id number]

## 2011-04-17 NOTE — Discharge Summary (Signed)
[name], [name]             ACCOUNT NO.:  [id number]   MEDICAL RECORD NO.:  [id number]          PATIENT TYPE:  INP   LOCATION:  A337                          FACILITY:  [facility]   PHYSICIAN:  Dorris Singh, DO    DATE OF BIRTH:  Nov 09, 1976   DATE OF ADMISSION:  08/12/2008  DATE OF DISCHARGE:  09/17/2009LH                               DISCHARGE SUMMARY   PRIMARY CARE PHYSICIAN:  She has none.  She was seen by Dr. Jerre Simon while  she was here.   ADMISSION DIAGNOSES:  1. Acute abdominal pain.  2. Acute hydronephrosis.  3. Recurrent ureterolithiasis and status post stent placement in the      past.   DISCHARGE DIAGNOSIS:  1. Acute exacerbation of asthma.  2. Leukocytosis.  3. Renal colic.   For H and P, please refer to her presenting illness.   The patient was admitted with the above admitting diagnoses and Dr.  Jerre Simon was consulted to see her.  She was started on IV antibiotics and  also started on inhalers for exacerbation of her asthma which was found  to be exacerbated on her admission.  Also, she was placed on her home  medications.  The patient continued to do well with her acute  hydronephrosis and her recurrent ureterolithiasis and continued to  improve.  However, her asthma did exacerbate and she continued to remain  wheezy.  She was started on steroids as well and put on albuterol and  Atrovent nebulizers.  It was determined the patient would have  outpatient lithotripsy with Dr. Jerre Simon.  On August 16, 2008, 17th the  patient was seen.  She was very upset that her daughter was also getting  sick and she felt like she was having an asthma attack at home and could  not stay.  I examined the patient on that day.  Her vitals are as  follows; 97.0 for temperature, 111 for pulse, 16 respirations, 104/53  for blood pressure.  Her labs on that day; white count 15.6, hemoglobin  11.8, hematocrit 34.7, platelet count to 269.  Sodium 140, potassium  4.5, chloride 111, CO2  of 24, glucose 140, BUN 13, creatinine 0.92.  The  patient was slightly improved, but still she had positive wheezing.  She  stated that she needed to go home due to her daughter being sick and  could not stay in the hospital.  I explained to her that I could not  discharge her to home with her still being clinically compromised and  that she would have to leave against medical advice.  On August 18, 2008, the patient signed the non-responsibility for leaving against AMA  form and left the hospital.     Dorris Singh, DO  Electronically Signed    CB/MEDQ  D:  10/31/2008  T:  10/31/2008  Job:  [id number]

## 2011-04-17 NOTE — Discharge Summary (Signed)
[name], HICKLE             ACCOUNT NO.:  [id number]   MEDICAL RECORD NO.:  [id number]          PATIENT TYPE:  INP   LOCATION:  A224                          FACILITY:  [facility]   PHYSICIAN:  Dennie Maizes, M.D.   DATE OF BIRTH:  Oct 31, 1976   DATE OF ADMISSION:  05/25/2007  DATE OF DISCHARGE:  06/27/2008LH                               DISCHARGE SUMMARY   FINAL DIAGNOSIS:  Left distal ureteral calculi with obstruction, left  renal colic, left hydronephrosis, bilateral renal calculi.   OPERATIVE PROCEDURE:  Cystoscopy, left retrograde pyelogram, left  ureteroscopy stone extraction and left ureteral stent placement done on  May 26, 2007.  Complications:  None.   DISCHARGE SUMMARY:  This 35 year old female has the past history of  recurrent urolithiasis.  She has had several hospitalizations and  procedures for kidney stones.  The last hospitalization was in March  2008.  She is usually under the care of Dr. Jerre Simon.  She complains of  severe left severe intermittent left flank pain radiating to the front  since May 24, 2007.  The pain was associated nausea and vomiting.  She  denied having any fever, chills, voiding difficulty or gross hematuria  at present.  She came to the urgency room at [facility].  Microscopic hematuria was noted.  She was evaluated with a CT scan of  the abdomen and pelvis.  This revealed multiple bilateral renal calculi  and the patient also had evidence of stone in the left distal ureter.  There was a 6 x 7.5 mm-size stone in the left distal ureter at the level  of S1 with obstruction.  The patient was admitted to the hospital for  pain control, IV fluids and further treatment.   PAST MEDICAL HISTORY:  1. Recurrent urolithiasis.  She has undergone multiple procedures.  2. History of bronchial asthma.  3. Status post cholecystectomy.  4. Status post tubal ligation.  5. Morbid obesity.   MEDICATIONS:  None.   ALLERGIES:  1. VICODIN.  2.  MORPHINE.   EXAMINATION:  GENERAL:  The patient was comfortable after receiving IV  Dilaudid.  HEENT:  Normal.  NECK:  No masses.  LUNGS: Clear to auscultation.  HEART:  Regular rate and rhythm.  No murmurs.  ABDOMEN:  Soft.  There was no palpable flank mass.  Moderate left  costovertebral angle tenderness was noted.  The bladder was not  palpable.   COURSE IN THE HOSPITAL:  She was started on IV fluids and PCA Dilaudid.  She had adequate pain relief with this.  She was unable to pass the  stone.  After discussion, she was taken to the OR on May 26, 2007.  Under general anesthesia cystoscopy, left retrograde pyelogram, left  ureteroscopy with stone extraction and stent placement were done.  The  stone was sent for chemical analysis.  The patient did well  postoperatively.  She had adequate relief of pain and nausea.  She was  discharged and sent home on May 27, 2007.  Her discharge medications  were Cipro 500 mg one p.o. b.i.d. for 7 days and Percocet 5/325  one p.o.  q.8h. p.r.n. pain #20.  She will be reviewed in the office on May 25, 2007, at which time the left ureteral stent with a string will be  removed.  The patient was advised to call me for fever, chills, voiding  difficulty or gross hematuria.   CONDITION OF THE PATIENT AT THE TIME OF DISCHARGE:  Stable.      Dennie Maizes, M.D.  Electronically Signed     SK/MEDQ  D:  06/06/2007  T:  06/06/2007  Job:  [id number]   cc:   Ky Barban, M.D.  Fax: [phone]

## 2011-04-17 NOTE — H&P (Signed)
[name], [name]                       ACCOUNT NO.:  [id number]   MEDICAL RECORD NO.:  [id number]                   PATIENT TYPE:  INP   LOCATION:  0378                                 FACILITY:  [facility]   PHYSICIAN:  Jamison Neighbor, M.D.               DATE OF BIRTH:  [birth year]   DATE OF ADMISSION:  02/13/2004  DATE OF DISCHARGE:  02/14/2004                                HISTORY & PHYSICAL   ADMISSION DIAGNOSIS:  Left ureteral calculus with hydronephrosis.   HISTORY:  This 35 year old female with a past history of stones.  The  patient has required lithotripsy and ureteroscopic extraction in the past.  The patient presented to the emergency room on two occasions this week, and  now has a stone in the mid portion of the left ureter that measures at  approximately 6 mm and is unlikely to pass.  The patient has hydronephrosis  and pain, and requires narcotics for pain management.  The patient is to be  admitted in order to undergo a cystoscopy and stent placement with possible  in situ laser lithotripsy.  The patient understands the plan and has agreed  to be admitted to the hospital for 23 hour observation.   PAST MEDICAL HISTORY:  Kidney stones.  She has no other chronic medical  illnesses.   MEDICATIONS:  She takes no medications on a regular basis.   PAST SURGICAL HISTORY:  Status post tubal ligation and cholecystectomy.   SOCIAL HISTORY:  She does not smoke, she does not use alcohol.   FAMILY HISTORY:  Unremarkable.   REVIEW OF SYSTEMS:  Unremarkable.   PHYSICAL EXAMINATION:  GENERAL:  She is a well-developed, well-nourished  female in pain from the stone.  VITAL SIGNS:  Her temperature is 97.1, pulse 75, respirations 22, blood  pressure 108/51.  HEENT:  Normocephalic, atraumatic.  Cranial nerves II-XII grossly intact.  NECK:  Supple with no adenopathy or thyromegaly.  HEART:  Regular rate and rhythm with no murmurs, thrills, rubs, gallops, or  heaves.  ABDOMEN:   Soft and nontender with the exception of the CVA area.  EXTREMITIES:  No cyanosis, clubbing, or edema.   IMPRESSION:  Left flank pain secondary to left ureteral calculus.   PLAN:  Admit for observation, stent placement, and eventual ESWL if the  stone cannot be removed.                                               Jamison Neighbor, M.D.    RJE/MEDQ  D:  02/13/2004  T:  02/14/2004  Job:  [id number]

## 2011-04-17 NOTE — Procedures (Signed)
NAMECHRISTAL, LAGERSTROM             ACCOUNT NO.:  [id number]   MEDICAL RECORD NO.:  [id number]          PATIENT TYPE:  OUT   LOCATION:  RESP                          FACILITY:  [facility]   PHYSICIAN:  Edward L. Juanetta Gosling, M.D.DATE OF BIRTH:  16-Jun-1976   DATE OF PROCEDURE:  DATE OF DISCHARGE:                            PULMONARY FUNCTION TEST   PROCEDURE:  Pulmonary function test.   OPERATOR:  Edward L. Juanetta Gosling, M.D.   RESULTS OF TEST:  1. Spirometry shows a mild ventilatory defect with evidence of air      flow obstruction.  2. Lung volumes are normal.  3. The DL CL is normal.  4. Arterial blood gases are normal.  5. There is significant bronchodilator improvement.      Edward L. Juanetta Gosling, M.D.  Electronically Signed     ELH/MEDQ  D:  01/12/2007  T:  01/12/2007  Job:  [id number]   cc:   Tesfaye D. Felecia Shelling, MD  Fax: [phone]

## 2011-08-01 ENCOUNTER — Emergency Department (HOSPITAL_COMMUNITY): Payer: Self-pay

## 2011-08-01 ENCOUNTER — Encounter: Payer: Self-pay | Admitting: Emergency Medicine

## 2011-08-01 ENCOUNTER — Inpatient Hospital Stay (HOSPITAL_COMMUNITY)
Admission: EM | Admit: 2011-08-01 | Discharge: 2011-08-07 | DRG: 694 | Disposition: A | Discharge status: Home / Self Care | Payer: Self-pay | Attending: Urology | Admitting: Urology

## 2011-08-01 DIAGNOSIS — N200 Calculus of kidney: Secondary | ICD-10-CM

## 2011-08-01 DIAGNOSIS — K5900 Constipation, unspecified: Secondary | ICD-10-CM | POA: Diagnosis present

## 2011-08-01 DIAGNOSIS — Y838 Other surgical procedures as the cause of abnormal reaction of the patient, or of later complication, without mention of misadventure at the time of the procedure: Secondary | ICD-10-CM | POA: Diagnosis not present

## 2011-08-01 DIAGNOSIS — N201 Calculus of ureter: Principal | ICD-10-CM | POA: Diagnosis present

## 2011-08-01 DIAGNOSIS — T17918A Gastric contents in respiratory tract, part unspecified causing other injury, initial encounter: Secondary | ICD-10-CM | POA: Diagnosis not present

## 2011-08-01 DIAGNOSIS — J4489 Other specified chronic obstructive pulmonary disease: Secondary | ICD-10-CM | POA: Diagnosis present

## 2011-08-01 DIAGNOSIS — T17910A Gastric contents in respiratory tract, part unspecified causing asphyxiation, initial encounter: Secondary | ICD-10-CM | POA: Diagnosis not present

## 2011-08-01 DIAGNOSIS — J449 Chronic obstructive pulmonary disease, unspecified: Secondary | ICD-10-CM | POA: Diagnosis present

## 2011-08-01 DIAGNOSIS — J988 Other specified respiratory disorders: Secondary | ICD-10-CM | POA: Diagnosis not present

## 2011-08-01 HISTORY — DX: Chronic obstructive pulmonary disease, unspecified: J44.9

## 2011-08-01 LAB — DIFFERENTIAL
Lymphocytes Relative: 29 % (ref 12–46)
Lymphs Abs: 3.4 10*3/uL (ref 0.7–4.0)
Neutro Abs: 7.4 10*3/uL (ref 1.7–7.7)
Neutrophils Relative %: 64 % (ref 43–77)

## 2011-08-01 LAB — URINALYSIS, ROUTINE W REFLEX MICROSCOPIC
Glucose, UA: NEGATIVE mg/dL
Specific Gravity, Urine: >1.03 — ABNORMAL HIGH (ref 1.005–1.030)

## 2011-08-01 LAB — CBC
Hemoglobin: 13.2 g/dL (ref 12.0–15.0)
Platelets: 254 10*3/uL (ref 150–400)
RBC: 4.35 MIL/uL (ref 3.87–5.11)
WBC: 11.7 10*3/uL — ABNORMAL HIGH (ref 4.0–10.5)

## 2011-08-01 LAB — URINE MICROSCOPIC-ADD ON

## 2011-08-01 LAB — COMPREHENSIVE METABOLIC PANEL
BUN: 12 mg/dL (ref 6–23)
CO2: 22 mEq/L (ref 19–32)
Calcium: 8.7 mg/dL (ref 8.4–10.5)
Chloride: 104 mEq/L (ref 96–112)
Creatinine, Ser: 0.79 mg/dL (ref 0.50–1.10)
GFR calc non Af Amer: >60 mL/min (ref >60)
Total Bilirubin: 0.2 mg/dL — ABNORMAL LOW (ref 0.3–1.2)

## 2011-08-01 MED ORDER — ONDANSETRON HCL 4 MG/2ML IJ SOLN
4.0000 mg | Freq: Once | INTRAMUSCULAR | Status: AC
  Administered 2011-08-01: 4 mg via INTRAVENOUS
  Filled 2011-08-01: qty 2

## 2011-08-01 MED ORDER — HYDROMORPHONE HCL 1 MG/ML IJ SOLN
1.0000 mg | Freq: Once | INTRAMUSCULAR | Status: AC
  Administered 2011-08-01: 1 mg via INTRAVENOUS

## 2011-08-01 MED ORDER — SODIUM CHLORIDE 0.9 % IJ SOLN
INTRAMUSCULAR | Status: AC
  Filled 2011-08-01: qty 10

## 2011-08-01 MED ORDER — TAMSULOSIN HCL 0.4 MG PO CAPS
0.4000 mg | ORAL_CAPSULE | Freq: Once | ORAL | Status: AC
  Administered 2011-08-01: 0.4 mg via ORAL
  Filled 2011-08-01: qty 1

## 2011-08-01 MED ORDER — SODIUM CHLORIDE 0.9 % IV SOLN
INTRAVENOUS | Status: AC
  Administered 2011-08-02: 08:00:00 via INTRAVENOUS

## 2011-08-01 MED ORDER — SODIUM CHLORIDE 0.9 % IJ SOLN
INTRAMUSCULAR | Status: AC
  Filled 2011-08-01: qty 3

## 2011-08-01 MED ORDER — HYDROMORPHONE HCL 1 MG/ML IJ SOLN
1.0000 mg | INTRAMUSCULAR | Status: DC | PRN
  Administered 2011-08-01 – 2011-08-06 (×29): 1 mg via INTRAVENOUS
  Filled 2011-08-01 (×29): qty 1

## 2011-08-01 MED ORDER — OXYCODONE-ACETAMINOPHEN 5-325 MG PO TABS: 1.0000 | ORAL_TABLET | ORAL | Status: AC | PRN

## 2011-08-01 MED ORDER — HYDROMORPHONE HCL 1 MG/ML IJ SOLN
1.0000 mg | Freq: Once | INTRAMUSCULAR | Status: AC
  Administered 2011-08-01: 1 mg via INTRAVENOUS
  Filled 2011-08-01: qty 1

## 2011-08-01 MED ORDER — HYDROMORPHONE HCL 1 MG/ML IJ SOLN
1.0000 mg | Freq: Once | INTRAMUSCULAR | Status: DC
  Filled 2011-08-01: qty 1

## 2011-08-01 MED ORDER — ONDANSETRON HCL 4 MG/2ML IJ SOLN
4.0000 mg | Freq: Four times a day (QID) | INTRAMUSCULAR | Status: DC | PRN
  Administered 2011-08-01 – 2011-08-04 (×3): 4 mg via INTRAVENOUS
  Filled 2011-08-01 (×3): qty 2

## 2011-08-01 MED ORDER — SODIUM CHLORIDE 0.9 % IV BOLUS (SEPSIS)
1000.0000 mL | Freq: Once | INTRAVENOUS | Status: AC
  Administered 2011-08-01: 1000 mL via INTRAVENOUS

## 2011-08-01 MED ORDER — PROMETHAZINE HCL 25 MG/ML IJ SOLN: 25.0000 mg | Freq: Four times a day (QID) | INTRAMUSCULAR | Status: DC | PRN

## 2011-08-01 NOTE — ED Notes (Signed)
Pt c/o r flank pain since last night with n/v/burning upon urination.

## 2011-08-01 NOTE — H&P (Signed)
NAME:  [name], [name] NO.:  [id number]  MEDICAL RECORD NO.:  [id number]  LOCATION:  APOTF                         FACILITY:  [facility]  PHYSICIAN:  Ky Barban, M.D.DATE OF BIRTH:  Nov 05, 1976  DATE OF ADMISSION:  08/01/2011 DATE OF DISCHARGE:  LH                             HISTORY & PHYSICAL   This patient is 35 year old who is well-known to me has a history of having kidney stones since last night having nausea, vomiting, and right flank pain.  No fever, chills or any voiding difficulty.  No gross hematuria.  CT scan in the emergency room was done it shows that she has stones in the right kidney and also 6 mm stone in the distal right ureter causing right hydroureter nephrosis.  She came here, she has been given IV pain medication like Dilaudid but continues to have the pain, so she is being admitted for further management of the stone.  She is not having any vomiting any more but still having nausea.  PAST MEDICAL HISTORY:  I have done several stone baskets on this patient of both sides finally she had bilateral renal calculi, and I had referred her to [facility] where she has undergone bilateral nephrolithotripsies and last one was in March this year and according to the patient, they have removed stones from both kidneys, both kidneys were clean, but now she presented again with right renal colic.  She has multiple small stones in the right kidney and also a 6 mm stone in the distal right ureter.  She denies any history of having hypertension or diabetes.  PERSONAL AND SOCIAL HISTORY:  She does not drink.  She does smoke every day.  She does have COPD also.  REVIEW OF SYSTEMS:  She denies any chest pain, orthopnea, PND, does have nausea and has been vomiting since last night.  PHYSICAL EXAMINATION:  GENERAL:  On examination, fully conscious, alert, oriented. CENTRAL NERVOUS SYSTEM:  No gross neurological deficit. HEAD, NECK, EYES, ENT:  Negative. CHEST:  Symmetrical. HEART:  Regular sinus rhythm. ABDOMEN:  Soft and flat.  Liver, spleen, and kidneys are not palpable. 1+ right CVA tenderness. PELVIC:  Deferred. EXTREMITIES:  Normal.  IMPRESSION:  Right renal and ureteral calculus.  PLAN:  IV fluids, parenteral analgesia.  I will see, if she can pass the stone in the next 24-hour if not we, will consider doing a stone basket.  Her lab data from this morning sodium 137, potassium 3.8, chloride 104, CO2 is 22, BUN is 12, creatinine 0.79.  Calcium is 8.7.  WBC count is 11.7 and hematocrit 39.2.  However, CT scans done today shows there are bilateral nonobstructing nephrolithiasis largest calculus in lower pole of the right kidney measures 9-mm, the largest nonobstructive calculus in the upper pole of the left kidney is 2 mm.  There is mild right hydronephrosis and hydroureter.  Mild right periureteral stranding, a calcified calculus is noted in the right ureterovesical junction measuring 6 mm in size. There is another stone just below the right UPJ 3.4 mm right upper ureter.  There is no ureteral calculi.  PLAN:  As I have mentioned above.     Ky Barban, M.D.  MIJ/MEDQ  D:  08/01/2011  T:  08/01/2011  Job:  [id number]

## 2011-08-01 NOTE — ED Notes (Signed)
Pt. Still c/o nausea---Zofran 4 mg given IVP and Flomax 0.4 mg given po as ordered

## 2011-08-01 NOTE — ED Provider Notes (Signed)
History     CSN: [id number] Arrival date & time: 08/01/2011  9:26 AM  Chief Complaint  Patient presents with  . Flank Pain   HPI Comments: Patient with a significant history of producing very large kidney stones,  Reporting she has never passed one without intervention.  Last seen for this at [facility] in June, with a Ct scan revealing left kidney stones.  Developed right sided flank pain yesterday.  Has seen urology specialist at [facility],  Per referral by Dr. Jerre Simon.    Patient is a 35 y.o. female presenting with flank pain. The history is provided by the patient.  Flank Pain This is a recurrent problem. The current episode started yesterday. The problem occurs constantly. The problem has been unchanged. Associated symptoms include nausea and vomiting. Pertinent negatives include no abdominal pain, arthralgias, chest pain, congestion, fever, headaches, joint swelling, neck pain, numbness, rash, sore throat or weakness. Associated symptoms comments: Dysuria . The symptoms are aggravated by nothing. She has tried nothing for the symptoms.    Past Medical History  Diagnosis Date  . Kidney stones   . COPD (chronic obstructive pulmonary disease)     Past Surgical History  Procedure Date  . Cholecystectomy   . Tubal ligation     Family History  Problem Relation Age of Onset  . COPD Mother   . COPD Father   . Heart attack Father     History  Substance Use Topics  . Smoking status: Current Everyday Smoker -- 0.5 packs/day  . Smokeless tobacco: Not on file  . Alcohol Use: No    OB History    Grav Para Term Preterm Abortions TAB SAB Ect Mult Living   3 3 3       3       Review of Systems  Constitutional: Negative for fever.  HENT: Negative for congestion, sore throat and neck pain.   Eyes: Negative.   Respiratory: Negative for chest tightness and shortness of breath.   Cardiovascular: Negative for chest pain.  Gastrointestinal: Positive for nausea and vomiting. Negative for  abdominal pain.  Genitourinary: Positive for hematuria and flank pain.  Musculoskeletal: Negative for joint swelling and arthralgias.  Skin: Negative.  Negative for rash and wound.  Neurological: Negative for dizziness, weakness, light-headedness, numbness and headaches.  Hematological: Negative.   Psychiatric/Behavioral: Negative.     Physical Exam  BP 121/74  Pulse 73  Temp(Src) 98.4 F (36.9 C) (Oral)  Resp 20  Ht 5\' 6"  (1.676 m)  Wt 230 lb (104.327 kg)  BMI 37.12 kg/m2  SpO2 97%  LMP 07/11/2011  Physical Exam  Nursing note and vitals reviewed. Constitutional: She is oriented to person, place, and time. She appears well-developed and well-nourished.  HENT:  Head: Normocephalic and atraumatic.  Eyes: Conjunctivae are normal.  Neck: Normal range of motion.  Cardiovascular: Normal rate, regular rhythm, normal heart sounds and intact distal pulses.   Pulmonary/Chest: Effort normal and breath sounds normal. She has no wheezes.  Abdominal: Soft. Bowel sounds are normal. There is tenderness. There is CVA tenderness. There is no rebound and no guarding.       Right flank pain.  Musculoskeletal: Normal range of motion.  Neurological: She is alert and oriented to person, place, and time.  Skin: Skin is warm and dry.  Psychiatric: She has a normal mood and affect.    ED Course  Procedures  Admission record and CT scan abd/pelvis from [facility] 05/10/11 reviewed - bilateral nonobstructive kidney stones,  Largest 4.8 mm in right kidney.  MDM Hematuria, known kidney stones.       Candis Musa, PA 08/01/11 1324   Patient unable to get pain free despite multiple doses of dilaudid.  Spoke with Dr. Jerre Simon who is willing to admit for pain control and further management of her stones.  Candis Musa, PA 08/01/11 1626

## 2011-08-02 MED ORDER — SODIUM CHLORIDE 0.9 % IJ SOLN
INTRAMUSCULAR | Status: AC
  Administered 2011-08-02: 10 mL
  Filled 2011-08-02: qty 10

## 2011-08-03 MED ORDER — SODIUM CHLORIDE 0.9 % IJ SOLN
INTRAMUSCULAR | Status: AC
  Administered 2011-08-03: 10 mL
  Filled 2011-08-03: qty 10

## 2011-08-03 MED ORDER — DOCUSATE SODIUM 100 MG PO CAPS
100.0000 mg | ORAL_CAPSULE | Freq: Two times a day (BID) | ORAL | Status: DC
  Administered 2011-08-03 – 2011-08-07 (×7): 100 mg via ORAL
  Filled 2011-08-03 (×7): qty 1

## 2011-08-03 MED ORDER — DOCUSATE SODIUM 100 MG PO CAPS: 100.0000 mg | ORAL_CAPSULE | Freq: Two times a day (BID) | ORAL | Status: DC | PRN

## 2011-08-03 NOTE — Plan of Care (Signed)
Problem: Phase I Progression Outcomes Goal: OOB as tolerated unless otherwise ordered Ambulating in room

## 2011-08-03 NOTE — Progress Notes (Signed)
[phone] NFA#[id number] ION#[id number]

## 2011-08-04 ENCOUNTER — Inpatient Hospital Stay (HOSPITAL_COMMUNITY): Payer: Self-pay | Admitting: Anesthesiology

## 2011-08-04 ENCOUNTER — Encounter (HOSPITAL_COMMUNITY): Payer: Self-pay | Admitting: Anesthesiology

## 2011-08-04 ENCOUNTER — Inpatient Hospital Stay (HOSPITAL_COMMUNITY): Payer: Self-pay

## 2011-08-04 ENCOUNTER — Encounter (HOSPITAL_COMMUNITY): Payer: Self-pay | Admitting: *Deleted

## 2011-08-04 ENCOUNTER — Encounter (HOSPITAL_COMMUNITY)
Admission: EM | Disposition: A | Discharge status: Home / Self Care | Payer: Self-pay | Source: Home / Self Care | Attending: Urology

## 2011-08-04 DIAGNOSIS — J449 Chronic obstructive pulmonary disease, unspecified: Secondary | ICD-10-CM | POA: Diagnosis present

## 2011-08-04 DIAGNOSIS — T17918A Gastric contents in respiratory tract, part unspecified causing other injury, initial encounter: Secondary | ICD-10-CM | POA: Diagnosis not present

## 2011-08-04 DIAGNOSIS — N200 Calculus of kidney: Secondary | ICD-10-CM | POA: Diagnosis present

## 2011-08-04 DIAGNOSIS — T17910A Gastric contents in respiratory tract, part unspecified causing asphyxiation, initial encounter: Secondary | ICD-10-CM | POA: Diagnosis not present

## 2011-08-04 HISTORY — PX: CYSTOSCOPY W/ RETROGRADES: SHX1426

## 2011-08-04 SURGERY — CYSTOSCOPY, WITH RETROGRADE PYELOGRAM
Anesthesia: General | Site: Ureter | Laterality: Right | Wound class: Clean Contaminated

## 2011-08-04 MED ORDER — DEXAMETHASONE SODIUM PHOSPHATE 4 MG/ML IJ SOLN
INTRAMUSCULAR | Status: AC
  Filled 2011-08-04: qty 2

## 2011-08-04 MED ORDER — NEOSTIGMINE METHYLSULFATE 1 MG/ML IJ SOLN
INTRAMUSCULAR | Status: DC | PRN
  Administered 2011-08-04: 3 mg via INTRAMUSCULAR

## 2011-08-04 MED ORDER — METHYLPREDNISOLONE SODIUM SUCC 125 MG IJ SOLR
125.0000 mg | Freq: Four times a day (QID) | INTRAMUSCULAR | Status: AC
  Administered 2011-08-04 – 2011-08-05 (×4): 125 mg via INTRAVENOUS
  Filled 2011-08-04 (×4): qty 2

## 2011-08-04 MED ORDER — FENTANYL CITRATE 0.05 MG/ML IJ SOLN
25.0000 ug | INTRAMUSCULAR | Status: DC | PRN
  Administered 2011-08-04 – 2011-08-06 (×5): 50 ug via INTRAVENOUS
  Filled 2011-08-04 (×5): qty 2

## 2011-08-04 MED ORDER — MIDAZOLAM HCL 5 MG/5ML IJ SOLN
INTRAMUSCULAR | Status: DC | PRN
  Administered 2011-08-04: 2 mg via INTRAVENOUS

## 2011-08-04 MED ORDER — PROPOFOL 10 MG/ML IV EMUL
INTRAVENOUS | Status: AC
  Filled 2011-08-04: qty 20

## 2011-08-04 MED ORDER — MIDAZOLAM HCL 2 MG/2ML IJ SOLN
1.0000 mg | INTRAMUSCULAR | Status: DC | PRN
  Administered 2011-08-04: 2 mg via INTRAVENOUS

## 2011-08-04 MED ORDER — SODIUM CHLORIDE 0.9 % IJ SOLN
INTRAMUSCULAR | Status: AC
  Administered 2011-08-04: 3 mL via INTRAVENOUS
  Filled 2011-08-04: qty 3

## 2011-08-04 MED ORDER — LACTATED RINGERS IV SOLN
INTRAVENOUS | Status: DC
  Administered 2011-08-04: 1000 mL via INTRAVENOUS

## 2011-08-04 MED ORDER — STERILE WATER FOR IRRIGATION IR SOLN
Status: DC | PRN
  Administered 2011-08-04: 1000 mL

## 2011-08-04 MED ORDER — SODIUM CHLORIDE 0.9 % IV SOLN
1.5000 g | Freq: Four times a day (QID) | INTRAVENOUS | Status: DC
  Administered 2011-08-04 – 2011-08-07 (×11): 1.5 g via INTRAVENOUS
  Filled 2011-08-04 (×24): qty 1.5

## 2011-08-04 MED ORDER — GLYCOPYRROLATE 0.2 MG/ML IJ SOLN
INTRAMUSCULAR | Status: DC | PRN
  Administered 2011-08-04: .4 mg via INTRAVENOUS

## 2011-08-04 MED ORDER — MIDAZOLAM HCL 2 MG/2ML IJ SOLN
INTRAMUSCULAR | Status: AC
  Administered 2011-08-04: 2 mg via INTRAVENOUS
  Filled 2011-08-04: qty 2

## 2011-08-04 MED ORDER — DEXAMETHASONE SODIUM PHOSPHATE 4 MG/ML IJ SOLN
INTRAMUSCULAR | Status: DC | PRN
  Administered 2011-08-04: 8 mg via INTRAVENOUS

## 2011-08-04 MED ORDER — ALBUTEROL SULFATE HFA 108 (90 BASE) MCG/ACT IN AERS
INHALATION_SPRAY | RESPIRATORY_TRACT | Status: AC
  Filled 2011-08-04: qty 6.7

## 2011-08-04 MED ORDER — SODIUM CHLORIDE 0.9 % IR SOLN
Status: DC | PRN
  Administered 2011-08-04: 6000 mL

## 2011-08-04 MED ORDER — IOHEXOL 350 MG/ML SOLN
INTRAVENOUS | Status: DC | PRN
  Administered 2011-08-04: 10 mL

## 2011-08-04 MED ORDER — LIDOCAINE HCL (PF) 1 % IJ SOLN
INTRAMUSCULAR | Status: AC
  Filled 2011-08-04: qty 5

## 2011-08-04 MED ORDER — LACTATED RINGERS IV SOLN
INTRAVENOUS | Status: DC | PRN
  Administered 2011-08-04: 11:00:00 via INTRAVENOUS

## 2011-08-04 MED ORDER — ROCURONIUM BROMIDE 100 MG/10ML IV SOLN
INTRAVENOUS | Status: DC | PRN
  Administered 2011-08-04: 25 mg via INTRAVENOUS

## 2011-08-04 MED ORDER — SUCCINYLCHOLINE CHLORIDE 20 MG/ML IJ SOLN
INTRAMUSCULAR | Status: DC | PRN
  Administered 2011-08-04: 100 mg via INTRAVENOUS

## 2011-08-04 MED ORDER — FENTANYL CITRATE 0.05 MG/ML IJ SOLN
INTRAMUSCULAR | Status: AC
  Filled 2011-08-04: qty 2

## 2011-08-04 MED ORDER — ONDANSETRON HCL 4 MG/2ML IJ SOLN: 4.0000 mg | Freq: Once | INTRAMUSCULAR | Status: AC | PRN

## 2011-08-04 MED ORDER — LIDOCAINE HCL 1 % IJ SOLN
INTRAMUSCULAR | Status: DC | PRN
  Administered 2011-08-04: 50 mg via INTRADERMAL

## 2011-08-04 MED ORDER — PROPOFOL 10 MG/ML IV EMUL
INTRAVENOUS | Status: DC | PRN
  Administered 2011-08-04: 40 mg via INTRAVENOUS
  Administered 2011-08-04: 160 mg via INTRAVENOUS

## 2011-08-04 MED ORDER — GLYCOPYRROLATE 0.2 MG/ML IJ SOLN
INTRAMUSCULAR | Status: AC
  Filled 2011-08-04: qty 1

## 2011-08-04 MED ORDER — FENTANYL CITRATE 0.05 MG/ML IJ SOLN
INTRAMUSCULAR | Status: AC
  Administered 2011-08-04: 50 ug via INTRAVENOUS
  Filled 2011-08-04: qty 2

## 2011-08-04 MED ORDER — FENTANYL CITRATE 0.05 MG/ML IJ SOLN
INTRAMUSCULAR | Status: DC | PRN
  Administered 2011-08-04 (×3): 50 ug via INTRAVENOUS

## 2011-08-04 MED ORDER — MIDAZOLAM HCL 2 MG/2ML IJ SOLN
INTRAMUSCULAR | Status: AC
  Filled 2011-08-04: qty 2

## 2011-08-04 SURGICAL SUPPLY — 15 items
BAG DRAIN URO TABLE W/ADPT NS (DRAPE) ×3 IMPLANT
CATH 5 FR WEDGE TIP (UROLOGICAL SUPPLIES) ×3 IMPLANT
CLOTH BEACON ORANGE TIMEOUT ST (SAFETY) ×3 IMPLANT
GLOVE BIO SURGEON STRL SZ7 (GLOVE) ×3 IMPLANT
GLOVE ECLIPSE 6.5 STRL STRAW (GLOVE) ×3 IMPLANT
GLOVE EXAM NITRILE MD LF STRL (GLOVE) ×3 IMPLANT
GLOVE INDICATOR 7.0 STRL GRN (GLOVE) ×6 IMPLANT
GOWN BRE IMP SLV AUR XL STRL (GOWN DISPOSABLE) ×6 IMPLANT
IV NS IRRIG 3000ML ARTHROMATIC (IV SOLUTION) ×6 IMPLANT
KIT ROOM TURNOVER AP CYSTO (KITS) ×3 IMPLANT
MANIFOLD NEPTUNE II (INSTRUMENTS) ×3 IMPLANT
PACK CYSTO (CUSTOM PROCEDURE TRAY) ×3 IMPLANT
PAD ARMBOARD 7.5X6 YLW CONV (MISCELLANEOUS) ×3 IMPLANT
TOWEL OR 17X26 4PK STRL BLUE (TOWEL DISPOSABLE) ×3 IMPLANT
WIRE GUIDE BENTSON .035 15CM (WIRE) ×3 IMPLANT

## 2011-08-04 NOTE — Op Note (Signed)
Report#dictated sept 4th 2012 [id number]

## 2011-08-04 NOTE — Progress Notes (Signed)
NAME:  [name], [name] NO.:  [id number]  MEDICAL RECORD NO.:  [id number]  LOCATION:                                 FACILITY:  PHYSICIAN:  Ky Barban, M.D.DATE OF BIRTH:  Mar 24, 1976  DATE OF PROCEDURE: DATE OF DISCHARGE:                                PROGRESS NOTE   She is afebrile, was still having pain, has not passed a stone, want me to go ahead and remove the stone.  She has two stones in the right ureter and one stone is 6 mm in the right ureterovesical junction, second stone is smaller which is 4.5 mm in the right upper ureter.  I told her that, I will go after the stone in the ureterovesical junction remove it with basket.  She understood the procedure.  She is familiar with the complication, because I have done this type of procedure on her several times.  She does not want me to use a stent.  I told her that I will try not to use stent but if I have to I will use it.  So she wanted me to go ahead and proceed.     Ky Barban, M.D.     MIJ/MEDQ  D:  08/03/2011  T:  08/03/2011  Job:  [id number]

## 2011-08-04 NOTE — Anesthesia Postprocedure Evaluation (Signed)
  Anesthesia Post-op Note  Patient: [name]  Procedure(s) Performed:  CYSTOSCOPY WITH RETROGRADE PYELOGRAM - Right Retrograde  Patient Location: PACU  Anesthesia Type: General  Level of Consciousness: awake  Airway and Oxygen Therapy: Patient Spontanous Breathing on FM O2, 100% sat  Post-op Pain: none  Post-op Assessment: Patient's Cardiovascular Status Stable, Respiratory Function Stable, Patent Airway, No signs of Nausea or vomiting and Pain level controlled  Post-op Vital Signs: stable  Complications: Aspiration of gastric contents during LMA anesthesia. Pt was intubated rapidly and trachea was suctioned. Pt now has no signs of any respiratory distress, O2 Sat=100%  CXR=clear, but will admit to ICU for further monitoring under Dr. Adah Perl care.

## 2011-08-04 NOTE — Op Note (Signed)
NAMECHEZNEY, HUETHER NO.:  [id number]  MEDICAL RECORD NO.:  [id number]  LOCATION:  IC06                          FACILITY:  [facility]  PHYSICIAN:  Ky Barban, M.D.DATE OF BIRTH:  June 22, 1976  DATE OF PROCEDURE: DATE OF DISCHARGE:                              OPERATIVE REPORT   SURGEON:  Ky Barban, MD  PREOPERATIVE DIAGNOSIS:  Right ureteral calculi.  POSTOPERATIVE DIAGNOSIS:  No calculus in the right ureter.  PROCEDURE:  Cystoscopy, right retrograde pyelogram.  ANESTHESIA:  General endotracheal.  PROCEDURE:  The patient was given general endotracheal anesthesia, but she aspirated at this point when she was placed asleep and the patient was stable, so I decided to go ahead and checked it out by doing a retrograde pyelogram, I did not want to spent too much time.  So, the patient under general endotracheal anesthesia in lithotomy position, usual prep and drape, #25 cystoscope was introduced into the bladder. Right ureteral orifice was catheterized with a wedge catheter.  Hypaque was injected.  Right ureter was normal caliber.  I do not see any filling defect.  I do suspect a small filling defect just below the UPJ on the right side and this could be the other small stone, which she has in this right ureter.  On CT scan, she had two stones; one in the right upper ureter 4.7 mm, the other stone was 6 mm in the right ureterovesical junction.  My primary abject was to remove the right ureterovesical junction stone.  It is not there, she probably passed it and the other stone which I suspect in the right upper ureter, I am not 100% sure if it is a stone, it is just slightly dilated ureter above that.  There is no significant obstruction.  I put a guidewire went to the level of that calculus and open-end catheter was passed to that level.  After removing the guidewire, Hypaque was injected under fluoroscopic control to see if I can see more definitely  any definite stone in that area where I did not see any stone.  There are few air bubbles.  So, I decided not to look into the ureter.  All the instruments were removed.  The patient left the operating room in satisfactory condition.     Ky Barban, M.D.     MIJ/MEDQ  D:  08/04/2011  T:  08/04/2011  Job:  [id number]

## 2011-08-04 NOTE — Consult Note (Signed)
Consult requested by: Dr. Jerre Simon and Marcos Eke Consult requested for aspiration of gastric contents :  HPI: This is a 35 year old who is undergoing surgery for kidney stones and who during the surgery had witnessed aspiration of gastric contents. She was intubated and had an NG tube placed. She had lavage of her endotracheal tube. She was able to be extubated in the recovery room. She does give a history of COPD and smokes about one third package of cigarettes daily. She says she feels like she has some congestion but otherwise is doing okay  Past Medical History  Diagnosis Date  . Kidney stones   . COPD (chronic obstructive pulmonary disease)      Family History  Problem Relation Age of Onset  . COPD Mother   . COPD Father   . Heart attack Father      History   Social History  . Marital Status: Single    Spouse Name: N/A    Number of Children: N/A  . Years of Education: N/A   Social History Main Topics  . Smoking status: Current Everyday Smoker -- 0.5 packs/day  . Smokeless tobacco: None  . Alcohol Use: No  . Drug Use: No  . Sexually Active: Yes    Birth Control/ Protection: Surgical   Other Topics Concern  . None   Social History Narrative  . None     ROS: She says she's been coughing up some blood since the surgery    Objective: Vital signs in last 24 hours: Temp:  [97.4 F (36.3 C)-98.4 F (36.9 C)] 97.4 F (36.3 C) (09/04 1700) Pulse Rate:  [65-120] 113  (09/04 1715) Resp:  [8-23] 8  (09/04 1715) BP: (99-144)/(55-85) 114/67 mmHg (09/04 1700) SpO2:  [91 %-100 %] 96 % (09/04 1715) Weight:  [129.4 kg (285 lb 4.4 oz)] 285 lb 4.4 oz (129.4 kg) (09/04 1515) Weight change:  Last BM Date: 08/01/11  Intake/Output from previous day: 09/03 0701 - 09/04 0700 In: 240 [P.O.:240] Out: -   PHYSICAL EXAM She is an obese female who does not seem to be in any acute distress. Her HEENT examination is unremarkable. Her neck is supple without masses. Her chest shows  rhonchi bilaterally. Her heart sounds are somewhat distant but she does not have a murmur gallop or rub. Her abdomen is soft obese without masses. Her extremities showed no edema. Her central nervous system examination is grossly intact  Lab Results: Basic Metabolic Panel: No results found for this basename: NA:2,K:2,CL:2,CO2:2,GLUCOSE:2,BUN:2,CREATININE:2,CALCIUM:2,MG:2,PHOS:2 in the last 72 hours Liver Function Tests: No results found for this basename: AST:2,ALT:2,ALKPHOS:2,BILITOT:2,PROT:2,ALBUMIN:2 in the last 72 hours No results found for this basename: LIPASE:2,AMYLASE:2 in the last 72 hours No results found for this basename: AMMONIA:2 in the last 72 hours CBC: No results found for this basename: WBC:2,NEUTROABS:2,HGB:2,HCT:2,MCV:2,PLT:2 in the last 72 hours Cardiac Enzymes: No results found for this basename: CKTOTAL:3,CKMB:3,CKMBINDEX:3,TROPONINI:3 in the last 72 hours BNP: No results found for this basename: POCBNP:3 in the last 72 hours D-Dimer: No results found for this basename: DDIMER:2 in the last 72 hours CBG: No results found for this basename: GLUCAP:6 in the last 72 hours Hemoglobin A1C: No results found for this basename: HGBA1C in the last 72 hours Fasting Lipid Panel: No results found for this basename: CHOL,HDL,LDLCALC,TRIG,CHOLHDL,LDLDIRECT in the last 72 hours Thyroid Function Tests: No results found for this basename: TSH,T4TOTAL,FREET4,T3FREE,THYROIDAB in the last 72 hours Anemia Panel: No results found for this basename: VITAMINB12,FOLATE,FERRITIN,TIBC,IRON,RETICCTPCT in the last 72 hours Urine Drug Screen:  Alcohol Level: No results found for this basename: ETH:2 in the last 72 hours Urinalysis:  Misc. Labs:   ABGS: No results found for this basename: PHART,PCO2,PO2ART,TCO2,HCO3 in the last 72 hours   MICROBIOLOGY: No results found for this or any previous visit (from the past 240 hour(s)).  Studies/Results: Dg Retrograde Pyelogram  08/04/2011   *RADIOLOGY REPORT*  Clinical Data: Right ureteral stone.  RETROGRADE PYELOGRAM  Comparison: CT 08/01/2011  Findings: First set of images from a right retrograde pyelogram demonstrates no definite visible filling defect in the right ureter.  The previously seen small ureteral stones cannot be visualized.  Slight prominence of the right renal pelvis and calyces.  Second set of images demonstrate multiple filling defects, most of which are felt represent air bubbles.  Small persistent filling defect seen within the proximal right ureter may reflect a small retained right ureteral stone.  IMPRESSION: As discussed above.  Original Report Authenticated By: Cyndie Chime, M.D.   Dg Chest Portable 1 View  08/04/2011  *RADIOLOGY REPORT*  Clinical Data: Aspiration during surgery  PORTABLE CHEST - 1 VIEW  Comparison: 12/03/2009  Findings: Minimal right basilar opacity.  Scarring versus atelectasis in the right mid lung.  No focal consolidation.  Left lung is essentially clear. No pleural effusion or pneumothorax.  The heart is top normal in size.  IMPRESSION: Minimal right basilar opacity.  No focal consolidation.  Scarring versus atelectasis in the right midlung.  Original Report Authenticated By: Charline Bills, M.D.    Medications:  Scheduled:   . albuterol      . dexamethasone      . docusate sodium  100 mg Oral BID  . fentaNYL      . fentaNYL      . glycopyrrolate      . lidocaine      . midazolam      . propofol       Continuous:   . DISCONTD: lactated ringers 1,000 mL (08/04/11 1052)   ZOX:WRUEAVWU, HYDROmorphone, ondansetron, ondansetron (ZOFRAN) IV, DISCONTD: docusate sodium, DISCONTD: iohexol, DISCONTD: midazolam, DISCONTD: sodium chloride, DISCONTD: sterile water  Assesment: She has aspiration of gastric contents. Because of her history of COPD I will add steroids and an antibiotic. Active Problems:  * No active hospital problems. *     Plan: She'll be on IV steroids and IV  antibiotics it should be okay for her to have liquids now.    LOS: 3 days   Cobey Raineri L 08/04/2011, 6:00 PM

## 2011-08-04 NOTE — Transfer of Care (Signed)
Immediate Anesthesia Transfer of Care Note  Patient: [name]  Procedure(s) Performed:  CYSTOSCOPY WITH RETROGRADE PYELOGRAM - Right Retrograde  Patient Location: PACU  Anesthesia Type: General  Level of Consciousness: awake and patient cooperative  Airway & Oxygen Therapy: Patient Spontanous Breathing and Patient connected to face mask oxygen  Post-op Assessment: Report given to PACU RN, Post -op Vital signs reviewed and stable and Patient moving all extremities X 4  Post vital signs: Reviewed and stable  Complications: No apparent anesthesia complications

## 2011-08-04 NOTE — Brief Op Note (Signed)
No change in h&p

## 2011-08-04 NOTE — Anesthesia Procedure Notes (Addendum)
Procedure Name: LMA Insertion Date/Time: 08/04/2011 11:53 AM Performed by: Despina Hidden Pre-anesthesia Checklist: Patient identified, Emergency Drugs available, Suction available and Patient being monitored Patient Re-evaluated:Patient Re-evaluated prior to inductionOxygen Delivery Method: Circle System Utilized Preoxygenation: Pre-oxygenation with 100% oxygen Intubation Type: IV induction Ventilation: Mask ventilation without difficulty LMA Size: 3.0 Number of attempts: 1 Placement Confirmation: positive ETCO2 and breath sounds checked- equal and bilateral Tube secured with: Tape Dental Injury: Teeth and Oropharynx as per pre-operative assessment    Procedure Name: Intubation Date/Time: 08/04/2011 12:05 PM Performed by: Despina Hidden Pre-anesthesia Checklist: Emergency Drugs available, Suction available, Patient being monitored and Patient identified Oxygen Delivery Method: Circle System Utilized Intubation Type: Circoid Pressure applied and Rapid sequence Laryngoscope Size: Mac and 3 Grade View: Grade I Tube type: Oral Tube size: 7.0 mm Number of attempts: 1 Airway Equipment and Method: stylet Placement Confirmation: ETT inserted through vocal cords under direct vision,  positive ETCO2 and breath sounds checked- equal and bilateral Secured at: 22 cm Tube secured with: Tape Dental Injury: Teeth and Oropharynx as per pre-operative assessment  Comments: 1205:lMA removed and immediately intubated

## 2011-08-04 NOTE — Brief Op Note (Signed)
08/01/2011 - 08/04/2011  12:38 PM  PATIENT:  Beda L Pacer  35 y.o. female  PRE-OPERATIVE DIAGNOSIS:  Right ureteral calculus  POST-OPERATIVE DIAGNOSIS: normal r retrograde pyelogram. No r ureteral calculus  PROCEDURE:  Procedure(s): CYSTOSCOPY/RETROGRADEpyelogram  SURGEON:  Surgeon(s): Ky Barban  PHYSICIAN ASSISTANT:   ASSISTANTS: none   ANESTHESIA:   general  ESTIMATED BLOOD LOSS: * No blood loss amount entered *   BLOOD ADMINISTERED:none  DRAINS: none   LOCAL MEDICATIONS USED:  NONE  SPECIMEN:  No Specimen  DISPOSITION OF SPECIMEN:  N/A  COUNTS:  yes  TOURNIQUET:  * No tourniquets in log *  DICTATION #:   PLAN OF CARE: to icu  PATIENT DISPOSITION:  ICU - intubated and hemodynamically stable.   Delay start of Pharmacological VTE agent (>24hrs) due to surgical blood loss or risk of bleeding:  not applicable

## 2011-08-04 NOTE — Anesthesia Preprocedure Evaluation (Addendum)
Anesthesia Evaluation  Name, MR# and DOB Patient awake  General Assessment Comment  Reviewed: Allergy & Precautions, H&P , NPO status , Patient's Chart, lab work & pertinent test results  History of Anesthesia Complications Negative for: history of anesthetic complications  Airway Mallampati: II  Neck ROM: Full    Dental  (+) Teeth Intact   Pulmonary  COPD COPD inhaler  + decreased breath sounds  decreased breath sounds none    Cardiovascular Regular Normal    Neuro/Psych    (+) Depression, Bipolar Disorder,    GI/Hepatic/Renal negative GI ROS            Endo/Other    Abdominal   Musculoskeletal   Hematology   Peds  Reproductive/Obstetrics    Anesthesia Other Findings             Anesthesia Physical Anesthesia Plan  ASA: III  Anesthesia Plan: General   Post-op Pain Management:    Induction: Intravenous  Airway Management Planned: LMA  Additional Equipment:   Intra-op Plan:   Post-operative Plan: Extubation in OR  Informed Consent: I have reviewed the patients History and Physical, chart, labs and discussed the procedure including the risks, benefits and alternatives for the proposed anesthesia with the patient or authorized representative who has indicated his/her understanding and acceptance.     Plan Discussed with:   Anesthesia Plan Comments:         Anesthesia Quick Evaluation

## 2011-08-04 NOTE — Progress Notes (Signed)
Had the pt resign surgical consent. Flowtron stockings applied. Locked device in place on right ankle, gauze placed underneath for pt safety.

## 2011-08-05 ENCOUNTER — Inpatient Hospital Stay (HOSPITAL_COMMUNITY): Payer: Self-pay

## 2011-08-05 LAB — BASIC METABOLIC PANEL
BUN: 12 mg/dL (ref 6–23)
CO2: 25 mEq/L (ref 19–32)
Chloride: 99 mEq/L (ref 96–112)
Creatinine, Ser: 0.75 mg/dL (ref 0.50–1.10)
Glucose, Bld: 162 mg/dL — ABNORMAL HIGH (ref 70–99)

## 2011-08-05 MED ORDER — SODIUM CHLORIDE 0.9 % IJ SOLN
INTRAMUSCULAR | Status: AC
  Administered 2011-08-05: 21:00:00
  Filled 2011-08-05: qty 10

## 2011-08-05 MED ORDER — IPRATROPIUM BROMIDE 0.02 % IN SOLN
0.5000 mg | Freq: Two times a day (BID) | RESPIRATORY_TRACT | Status: DC
  Administered 2011-08-05 – 2011-08-07 (×4): 0.5 mg via RESPIRATORY_TRACT
  Filled 2011-08-05 (×4): qty 2.5

## 2011-08-05 MED ORDER — IPRATROPIUM BROMIDE 0.02 % IN SOLN
0.5000 mg | RESPIRATORY_TRACT | Status: DC
  Administered 2011-08-05: 0.5 mg via RESPIRATORY_TRACT
  Filled 2011-08-05: qty 2.5

## 2011-08-05 MED ORDER — ALBUTEROL SULFATE (5 MG/ML) 0.5% IN NEBU
2.5000 mg | INHALATION_SOLUTION | Freq: Two times a day (BID) | RESPIRATORY_TRACT | Status: DC
  Administered 2011-08-05 – 2011-08-07 (×4): 2.5 mg via RESPIRATORY_TRACT
  Filled 2011-08-05 (×4): qty 0.5

## 2011-08-05 MED ORDER — FLEET ENEMA 7-19 GM/118ML RE ENEM
1.0000 | ENEMA | Freq: Once | RECTAL | Status: AC | PRN
  Administered 2011-08-05: 1 via RECTAL

## 2011-08-05 MED ORDER — ALUM & MAG HYDROXIDE-SIMETH 200-200-20 MG/5ML PO SUSP
30.0000 mL | ORAL | Status: DC | PRN
  Administered 2011-08-05 – 2011-08-06 (×4): 30 mL via ORAL
  Filled 2011-08-05 (×4): qty 30

## 2011-08-05 MED ORDER — ALBUTEROL SULFATE (5 MG/ML) 0.5% IN NEBU: 2.5000 mg | INHALATION_SOLUTION | RESPIRATORY_TRACT | Status: DC

## 2011-08-05 MED ORDER — POLYETHYLENE GLYCOL 3350 17 G PO PACK
17.0000 g | PACK | Freq: Once | ORAL | Status: AC
  Administered 2011-08-05: 20:00:00 via ORAL
  Filled 2011-08-05: qty 1

## 2011-08-05 MED ORDER — IPRATROPIUM BROMIDE 0.02 % IN SOLN: 0.5000 mg | Freq: Two times a day (BID) | RESPIRATORY_TRACT | Status: DC

## 2011-08-05 MED ORDER — ALBUTEROL SULFATE (5 MG/ML) 0.5% IN NEBU
2.5000 mg | INHALATION_SOLUTION | RESPIRATORY_TRACT | Status: DC
  Administered 2011-08-05: 2.5 mg via RESPIRATORY_TRACT
  Filled 2011-08-05: qty 0.5

## 2011-08-05 MED ORDER — ALBUTEROL SULFATE (5 MG/ML) 0.5% IN NEBU
2.5000 mg | INHALATION_SOLUTION | RESPIRATORY_TRACT | Status: DC | PRN
  Administered 2011-08-06: 2.5 mg via RESPIRATORY_TRACT
  Filled 2011-08-05: qty 0.5

## 2011-08-05 NOTE — Addendum Note (Signed)
Addendum  created 08/05/11 0755 by Minerva Areola, CRNA   Modules edited:Anesthesia Events, Notes Section

## 2011-08-05 NOTE — Progress Notes (Signed)
Report#[id number]      [id number]

## 2011-08-05 NOTE — Progress Notes (Signed)
UR Chart Review Completed  

## 2011-08-05 NOTE — Anesthesia Postprocedure Evaluation (Signed)
Anesthesia Post Note  Patient: [name]  Procedure(s) Performed:  CYSTOSCOPY WITH RETROGRADE PYELOGRAM - Right Retrograde  Anesthesia type: General  Patient location: Icu 6  Post pain: Pain level controlled  Post assessment: Post-op Vital signs reviewed, Patient's Cardiovascular Status Stable, Respiratory Function Stable, Patent Airway, No signs of Nausea or vomiting and Pain level controlled  Last Vitals:  Filed Vitals:   08/05/11 0600  BP: 102/74  Pulse: 80  Temp:   Resp: 23    Post vital signs: Reviewed and stable  Level of consciousness: awake and alert   Complications: No apparent anesthesia complications Lungs-productive cough O2 sat 98% on nasal cannula Reports sore throat resolving

## 2011-08-05 NOTE — Progress Notes (Signed)
Subjective: She remains in the intensive care unit after her surgery an episode of aspiration. She says she feels better but is still very congested and wheezing. She does have a history of COPD as well.  Objective: Vital signs in last 24 hours: Temp:  [97.3 F (36.3 C)-98.5 F (36.9 C)] 98.5 F (36.9 C) (09/05 0800) Pulse Rate:  [65-132] 98  (09/05 0800) Resp:  [8-23] 20  (09/05 0800) BP: (92-144)/(42-98) 94/42 mmHg (09/05 0800) SpO2:  [91 %-100 %] 99 % (09/05 0800) Weight:  [129.4 kg (285 lb 4.4 oz)] 285 lb 4.4 oz (129.4 kg) (09/04 1515) Weight change:  Last BM Date: 08/01/11  Intake/Output from previous day: 09/04 0701 - 09/05 0700 In: 1310 [P.O.:360; I.V.:800; IV Piggyback:150] Out: 2450 [Urine:2450]  PHYSICAL EXAM General appearance: alert, cooperative and moderately obese Resp: wheezes bilaterally Cardio: regular rate and rhythm, S1, S2 normal, no murmur, click, rub or gallop GI: soft, non-tender; bowel sounds normal; no masses,  no organomegaly Extremities: extremities normal, atraumatic, no cyanosis or edema  Lab Results:    Basic Metabolic Panel:  Basename 08/05/11 0424  NA 131*  K 4.4  CL 99  CO2 25  GLUCOSE 162*  BUN 12  CREATININE 0.75  CALCIUM 8.8  MG --  PHOS --   Liver Function Tests: No results found for this basename: AST:2,ALT:2,ALKPHOS:2,BILITOT:2,PROT:2,ALBUMIN:2 in the last 72 hours No results found for this basename: LIPASE:2,AMYLASE:2 in the last 72 hours No results found for this basename: AMMONIA:2 in the last 72 hours CBC: No results found for this basename: WBC:2,NEUTROABS:2,HGB:2,HCT:2,MCV:2,PLT:2 in the last 72 hours Cardiac Enzymes: No results found for this basename: CKTOTAL:3,CKMB:3,CKMBINDEX:3,TROPONINI:3 in the last 72 hours BNP: No results found for this basename: POCBNP:3 in the last 72 hours D-Dimer: No results found for this basename: DDIMER:2 in the last 72 hours CBG: No results found for this basename: GLUCAP:6 in the  last 72 hours Hemoglobin A1C: No results found for this basename: HGBA1C in the last 72 hours Fasting Lipid Panel: No results found for this basename: CHOL,HDL,LDLCALC,TRIG,CHOLHDL,LDLDIRECT in the last 72 hours Thyroid Function Tests: No results found for this basename: TSH,T4TOTAL,FREET4,T3FREE,THYROIDAB in the last 72 hours Anemia Panel: No results found for this basename: VITAMINB12,FOLATE,FERRITIN,TIBC,IRON,RETICCTPCT in the last 72 hours Urine Drug Screen:  Alcohol Level: No results found for this basename: ETH:2 in the last 72 hours Urinalysis:  Misc. Labs:  ABGS No results found for this basename: PHART,PCO2,PO2ART,TCO2,HCO3 in the last 72 hours CULTURES Recent Results (from the past 240 hour(s))  MRSA PCR SCREENING     Status: Normal   Collection Time   08/04/11  2:45 PM      Component Value Range Status Comment   MRSA by PCR NEGATIVE  NEGATIVE  Final    Studies/Results: Dg Retrograde Pyelogram  08/04/2011  *RADIOLOGY REPORT*  Clinical Data: Right ureteral stone.  RETROGRADE PYELOGRAM  Comparison: CT 08/01/2011  Findings: First set of images from a right retrograde pyelogram demonstrates no definite visible filling defect in the right ureter.  The previously seen small ureteral stones cannot be visualized.  Slight prominence of the right renal pelvis and calyces.  Second set of images demonstrate multiple filling defects, most of which are felt represent air bubbles.  Small persistent filling defect seen within the proximal right ureter may reflect a small retained right ureteral stone.  IMPRESSION: As discussed above.  Original Report Authenticated By: Cyndie Chime, M.D.   Dg Chest Portable 1 View  08/05/2011  *RADIOLOGY REPORT*  Clinical  Data: Aspiration  PORTABLE CHEST - 1 VIEW  Comparison: 08/04/2011  Findings: Mild patchy right lower lobe opacity, increasing, likely reflecting aspiration.  Left lung is essentially clear. No pleural effusion or pneumothorax.  Or mediastinal  silhouette is within normal limits.  IMPRESSION: Mild patchy right lower lobe opacity, increasing, likely reflecting aspiration.  Original Report Authenticated By: Charline Bills, M.D.   Dg Chest Portable 1 View  08/04/2011  *RADIOLOGY REPORT*  Clinical Data: Aspiration during surgery  PORTABLE CHEST - 1 VIEW  Comparison: 12/03/2009  Findings: Minimal right basilar opacity.  Scarring versus atelectasis in the right mid lung.  No focal consolidation.  Left lung is essentially clear. No pleural effusion or pneumothorax.  The heart is top normal in size.  IMPRESSION: Minimal right basilar opacity.  No focal consolidation.  Scarring versus atelectasis in the right midlung.  Original Report Authenticated By: Charline Bills, M.D.    Medications:  Prior to Admission:  Prescriptions prior to admission  Medication Sig Dispense Refill  . albuterol (PROVENTIL HFA;VENTOLIN HFA) 108 (90 BASE) MCG/ACT inhaler Inhale 2 puffs into the lungs every 6 (six) hours as needed. For asthma       . albuterol (PROVENTIL) (2.5 MG/3ML) 0.083% nebulizer solution Take 2.5 mg by nebulization every 6 (six) hours as needed. For asthma       . ibuprofen (ADVIL,MOTRIN) 200 MG tablet Take 400 mg by mouth every 6 (six) hours as needed. For pain        Scheduled:   . albuterol      . ipratropium  0.5 mg Nebulization Q4H   And  . albuterol  2.5 mg Nebulization Q4H  . ampicillin-sulbactam (UNASYN) IV  1.5 g Intravenous Q6H  . dexamethasone      . docusate sodium  100 mg Oral BID  . fentaNYL      . glycopyrrolate      . lidocaine      . methylPREDNISolone sodium succinate  125 mg Intravenous Q6H  . midazolam      . propofol      . sodium chloride       Continuous:   . DISCONTD: lactated ringers 1,000 mL (08/04/11 1052)   ZOX:WRUEAVWU, HYDROmorphone, ondansetron, ondansetron (ZOFRAN) IV, DISCONTD: iohexol, DISCONTD: midazolam, DISCONTD: sodium chloride, DISCONTD: sterile water  Assesment: She aspirated during the  surgery. She has some inflammatory bronchitis from that. She has COPD. She is better but still has congestion Principal Problem:  *Kidney stones Active Problems:  Aspiration of gastric contents  COPD (chronic obstructive pulmonary disease)    Plan: She will continue with her intravenous steroids continue his other treatments and I've added nebulizer treatments also.    LOS: 4 days   Chey Rachels L 08/05/2011, 8:52 AM

## 2011-08-06 ENCOUNTER — Inpatient Hospital Stay (HOSPITAL_COMMUNITY): Payer: Self-pay

## 2011-08-06 LAB — BASIC METABOLIC PANEL
BUN: 14 mg/dL (ref 6–23)
Calcium: 8.9 mg/dL (ref 8.4–10.5)
GFR calc Af Amer: >60 mL/min (ref >60)
GFR calc non Af Amer: >60 mL/min (ref >60)
Glucose, Bld: 155 mg/dL — ABNORMAL HIGH (ref 70–99)
Potassium: 4 mEq/L (ref 3.5–5.1)
Sodium: 137 mEq/L (ref 135–145)

## 2011-08-06 LAB — BLOOD GAS, ARTERIAL
Acid-Base Excess: 0.9 mmol/L (ref 0.0–2.0)
Drawn by: 27407
FIO2: 0.21 %
O2 Saturation: 94.3 %
Patient temperature: 37

## 2011-08-06 LAB — CBC
MCH: 30.9 pg (ref 26.0–34.0)
MCHC: 33.8 g/dL (ref 30.0–36.0)
MCV: 91.3 fL (ref 78.0–100.0)
Platelets: 318 10*3/uL (ref 150–400)

## 2011-08-06 LAB — DIFFERENTIAL
Basophils Relative: 0 % (ref 0–1)
Eosinophils Absolute: 0 10*3/uL (ref 0.0–0.7)
Eosinophils Relative: 0 % (ref 0–5)
Neutrophils Relative %: 86 % — ABNORMAL HIGH (ref 43–77)

## 2011-08-06 MED ORDER — SODIUM CHLORIDE 0.9 % IJ SOLN
INTRAMUSCULAR | Status: AC
  Filled 2011-08-06: qty 10

## 2011-08-06 MED ORDER — FLEET ENEMA 7-19 GM/118ML RE ENEM
1.0000 | ENEMA | Freq: Once | RECTAL | Status: AC
  Administered 2011-08-06: 1 via RECTAL

## 2011-08-06 MED ORDER — HYDROMORPHONE HCL 1 MG/ML IJ SOLN
1.0000 mg | INTRAMUSCULAR | Status: DC | PRN
  Administered 2011-08-06 – 2011-08-07 (×4): 1 mg via INTRAVENOUS
  Filled 2011-08-06 (×4): qty 1

## 2011-08-06 MED ORDER — CASTOR OIL OIL
30.0000 mL | TOPICAL_OIL | Freq: Once | Status: DC
  Filled 2011-08-06: qty 60

## 2011-08-06 MED ORDER — MAGNESIUM CITRATE PO SOLN
300.0000 mL | Freq: Once | ORAL | Status: AC
  Administered 2011-08-06: 300 mL via ORAL
  Filled 2011-08-06: qty 296

## 2011-08-06 MED ORDER — METHYLPREDNISOLONE SODIUM SUCC 125 MG IJ SOLR
125.0000 mg | Freq: Four times a day (QID) | INTRAMUSCULAR | Status: AC
  Administered 2011-08-06 – 2011-08-07 (×3): 125 mg via INTRAVENOUS
  Filled 2011-08-06 (×3): qty 2

## 2011-08-06 NOTE — H&P (Signed)
[id number] [id number].

## 2011-08-06 NOTE — Progress Notes (Signed)
NAME:  [name], [name] NO.:  [id number]  MEDICAL RECORD NO.:  [id number]  LOCATION:  IC06                          FACILITY:  [facility]  PHYSICIAN:  Ky Barban, M.D.DATE OF BIRTH:  1976/07/30  DATE OF PROCEDURE: DATE OF DISCHARGE:                                PROGRESS NOTE   As far as her urinary tract is concerned, she is doing fine, still having some discomfort in the right flank and she might have an additional small calculus as I mentioned on my note yesterday, but she can go home with that.  The other problem is that she aspirated during her last surgical procedure and clinically, she is doing well, but today she is having some wheezing.  So, she is not being discharged.  Dr. Juanetta Gosling, pulmonologist is looking after that part.  When it is okay with him, he can make a decision to discharge, other than, I will see her back in the office.     Ky Barban, M.D.     MIJ/MEDQ  D:  08/06/2011  T:  08/06/2011  Job:  [id number]

## 2011-08-06 NOTE — Progress Notes (Signed)
NAME:  KAMELIA, LAMPKINS NO.:  [id number]  MEDICAL RECORD NO.:  [id number]  LOCATION:                                 FACILITY:  PHYSICIAN:  Ky Barban, M.D.DATE OF BIRTH:  July 02, 1976  DATE OF PROCEDURE: DATE OF DISCHARGE:                                PROGRESS NOTE   She is doing fine clinically but still having pain in the right flank. She may have another stone, but it is probably not a big stone.  I told her that she will probably pass it.  The stone which was big 5-6 mm in size in the right ureterovesical junction is not there any more and most likely clinically, she is doing okay.  She can be discharged home tomorrow on pain medicine.  I can follow her in the office, and her labs today show sodium 131, potassium 4.4, chloride 99, CO2 is 25, BUN is 12, creatinine 0.75.  Blood pressure is 126/68, pulse 79, O2 saturation is 99.     Ky Barban, M.D.     MIJ/MEDQ  D:  08/05/2011  T:  08/05/2011  Job:  [id number]

## 2011-08-06 NOTE — Progress Notes (Signed)
Subjective: She says she still short of breath and coughing. Her chest x-ray yesterday showed an infiltrate I think is probably inflammatory changes from having gastric contents in her lung. She is also complaining of not being able to have a bowel movement. She tried MiraLAX and a fleets enema last night. Otherwise she is doing okay.  Objective: Vital signs in last 24 hours: Temp:  [98.2 F (36.8 C)-98.6 F (37 C)] 98.6 F (37 C) (09/06 0400) Pulse Rate:  [0-111] 106  (09/06 0200) Resp:  [12-26] 14  (09/06 0600) BP: (75-141)/(42-106) 91/55 mmHg (09/06 0600) SpO2:  [90 %-99 %] 98 % (09/06 0725) Weight change:  Last BM Date: 07/29/11  Intake/Output from previous day: 09/05 0701 - 09/06 0700 In: 1410 [P.O.:1200; I.V.:10; IV Piggyback:200] Out: 2400 [Urine:2400]  PHYSICAL EXAM General appearance: alert, mild distress and morbidly obese Resp: rhonchi bilaterally Cardio: regular rate and rhythm, S1, S2 normal, no murmur, click, rub or gallop GI: soft, non-tender; bowel sounds normal; no masses,  no organomegaly Extremities: extremities normal, atraumatic, no cyanosis or edema  Lab Results:    Basic Metabolic Panel:  Basename 08/06/11 0453 08/05/11 0424  NA 137 131*  K 4.0 4.4  CL 105 99  CO2 28 25  GLUCOSE 155* 162*  BUN 14 12  CREATININE 0.70 0.75  CALCIUM 8.9 8.8  MG -- --  PHOS -- --   Liver Function Tests: No results found for this basename: AST:2,ALT:2,ALKPHOS:2,BILITOT:2,PROT:2,ALBUMIN:2 in the last 72 hours No results found for this basename: LIPASE:2,AMYLASE:2 in the last 72 hours No results found for this basename: AMMONIA:2 in the last 72 hours CBC:  Basename 08/06/11 0453  WBC 23.0*  NEUTROABS 19.8*  HGB 12.1  HCT 35.8*  MCV 91.3  PLT 318   Cardiac Enzymes: No results found for this basename: CKTOTAL:3,CKMB:3,CKMBINDEX:3,TROPONINI:3 in the last 72 hours BNP: No results found for this basename: POCBNP:3 in the last 72 hours D-Dimer: No results  found for this basename: DDIMER:2 in the last 72 hours CBG: No results found for this basename: GLUCAP:6 in the last 72 hours Hemoglobin A1C: No results found for this basename: HGBA1C in the last 72 hours Fasting Lipid Panel: No results found for this basename: CHOL,HDL,LDLCALC,TRIG,CHOLHDL,LDLDIRECT in the last 72 hours Thyroid Function Tests: No results found for this basename: TSH,T4TOTAL,FREET4,T3FREE,THYROIDAB in the last 72 hours Anemia Panel: No results found for this basename: VITAMINB12,FOLATE,FERRITIN,TIBC,IRON,RETICCTPCT in the last 72 hours Urine Drug Screen:  Alcohol Level: No results found for this basename: ETH:2 in the last 72 hours Urinalysis:  Misc. Labs:  ABGS No results found for this basename: PHART,PCO2,PO2ART,TCO2,HCO3 in the last 72 hours CULTURES Recent Results (from the past 240 hour(s))  MRSA PCR SCREENING     Status: Normal   Collection Time   08/04/11  2:45 PM      Component Value Range Status Comment   MRSA by PCR NEGATIVE  NEGATIVE  Final    Studies/Results: Dg Retrograde Pyelogram  08/04/2011  *RADIOLOGY REPORT*  Clinical Data: Right ureteral stone.  RETROGRADE PYELOGRAM  Comparison: CT 08/01/2011  Findings: First set of images from a right retrograde pyelogram demonstrates no definite visible filling defect in the right ureter.  The previously seen small ureteral stones cannot be visualized.  Slight prominence of the right renal pelvis and calyces.  Second set of images demonstrate multiple filling defects, most of which are felt represent air bubbles.  Small persistent filling defect seen within the proximal right ureter may reflect a small retained right  ureteral stone.  IMPRESSION: As discussed above.  Original Report Authenticated By: Cyndie Chime, M.D.   Dg Chest Portable 1 View  08/05/2011  *RADIOLOGY REPORT*  Clinical Data: Aspiration  PORTABLE CHEST - 1 VIEW  Comparison: 08/04/2011  Findings: Mild patchy right lower lobe opacity, increasing,  likely reflecting aspiration.  Left lung is essentially clear. No pleural effusion or pneumothorax.  Or mediastinal silhouette is within normal limits.  IMPRESSION: Mild patchy right lower lobe opacity, increasing, likely reflecting aspiration.  Original Report Authenticated By: Charline Bills, M.D.   Dg Chest Portable 1 View  08/04/2011  *RADIOLOGY REPORT*  Clinical Data: Aspiration during surgery  PORTABLE CHEST - 1 VIEW  Comparison: 12/03/2009  Findings: Minimal right basilar opacity.  Scarring versus atelectasis in the right mid lung.  No focal consolidation.  Left lung is essentially clear. No pleural effusion or pneumothorax.  The heart is top normal in size.  IMPRESSION: Minimal right basilar opacity.  No focal consolidation.  Scarring versus atelectasis in the right midlung.  Original Report Authenticated By: Charline Bills, M.D.    Medications:  Scheduled:   . albuterol  2.5 mg Nebulization BID   And  . ipratropium  0.5 mg Nebulization BID  . ampicillin-sulbactam (UNASYN) IV  1.5 g Intravenous Q6H  . castor oil  30 mL Oral Once  . docusate sodium  100 mg Oral BID  . methylPREDNISolone sodium succinate  125 mg Intravenous Q6H  . methylPREDNISolone sodium succinate  125 mg Intravenous Q6H  . polyethylene glycol  17 g Oral Once  . sodium chloride      . DISCONTD: albuterol  2.5 mg Nebulization Q4H  . DISCONTD: albuterol  2.5 mg Nebulization Q4H  . DISCONTD: ipratropium  0.5 mg Nebulization Q4H  . DISCONTD: ipratropium  0.5 mg Nebulization BID   Continuous:  GNF:[id number], alum & mag hydroxide-simeth, HYDROmorphone, ondansetron, sodium phosphate, DISCONTD: fentaNYL  Assesment: She has aspiration of gastric contents. She has some infiltrate on the basis of that. She has been on steroids and antibiotics but I had discontinued her stair Roy's. Since she's having more trouble however I've asked to restart the steroids. Because of her constipation I'm going to have her go ahead and get  castor oil today. Principal Problem:  *Kidney stones Active Problems:  Aspiration of gastric contents  COPD (chronic obstructive pulmonary disease)    Plan: As above she'll continue with nebulizer treatments antibiotics steroids etc.    LOS: 5 days   Dotty Gonzalo L 08/06/2011, 7:45 AM

## 2011-08-06 NOTE — Progress Notes (Signed)
Patient transferred to 325 via wheelchair without difficulty by Shearon Stalls NT. Vitals stable at this time. Report given to Terressa Koyanagi RN

## 2011-08-07 MED ORDER — ONDANSETRON HCL 4 MG PO TABS: 4.0000 mg | ORAL_TABLET | Freq: Three times a day (TID) | ORAL | Status: AC | PRN

## 2011-08-07 MED ORDER — OXYCODONE-ACETAMINOPHEN 10-325 MG PO TABS: 1.0000 | ORAL_TABLET | ORAL | Status: AC | PRN

## 2011-08-07 MED ORDER — AMOXICILLIN-POT CLAVULANATE 875-125 MG PO TABS: 1.0000 | ORAL_TABLET | Freq: Two times a day (BID) | ORAL | Status: AC

## 2011-08-07 NOTE — Discharge Summary (Signed)
Physician Discharge Summary  Patient ID: Kelly VILLAMAR MRN: [id number] DOB/AGE: [birth year] 35 y.o. Primary Care Physician:No primary provider on file. Admit date: 08/01/2011 Discharge date: 08/07/2011    Discharge Diagnoses:   Principal Problem:  *Kidney stones Active Problems:  Aspiration of gastric contents  COPD (chronic obstructive pulmonary disease)   Current Discharge Medication List    START taking these medications   Details  amoxicillin-clavulanate (AUGMENTIN) 875-125 MG per tablet Take 1 tablet by mouth 2 (two) times daily. Qty: 20 tablet, Refills: 0    ondansetron (ZOFRAN) 4 MG tablet Take 1 tablet (4 mg total) by mouth every 8 (eight) hours as needed for nausea. Qty: 20 tablet, Refills: 1    oxyCODONE-acetaminophen (PERCOCET) 10-325 MG per tablet Take 1 tablet by mouth every 4 (four) hours as needed for pain. Qty: 30 tablet, Refills: 0    oxyCODONE-acetaminophen (PERCOCET) 5-325 MG per tablet Take 1 tablet by mouth every 4 (four) hours as needed for pain. Qty: 15 tablet, Refills: 0      CONTINUE these medications which have NOT CHANGED   Details  albuterol (PROVENTIL HFA;VENTOLIN HFA) 108 (90 BASE) MCG/ACT inhaler Inhale 2 puffs into the lungs every 6 (six) hours as needed. For asthma     albuterol (PROVENTIL) (2.5 MG/3ML) 0.083% nebulizer solution Take 2.5 mg by nebulization every 6 (six) hours as needed. For asthma     ibuprofen (ADVIL,MOTRIN) 200 MG tablet Take 400 mg by mouth every 6 (six) hours as needed. For pain         Discharged Condition: improved    Consults: Dr. Juanetta Gosling  Significant Diagnostic Studies: Ct Abdomen Pelvis Wo Contrast  08/01/2011  *RADIOLOGY REPORT*  Clinical Data: Right flank pain  CT ABDOMEN AND PELVIS WITHOUT CONTRAST  Technique:  Multidetector CT imaging of the abdomen and pelvis was performed following the standard protocol without intravenous contrast.  Comparison: 08/17/2010  Findings: Lung bases are unremarkable.   Sagittal images of the spine shows no destructive bony lesions.  The patient is status post cholecystectomy.  Liver, spleen, pancreas and adrenals are unremarkable.  No aortic aneurysm.  No small bowel obstruction.  No ascites or free air.  No adenopathy. Normal appendix is clearly visualized axial image 67.  There is no pericecal inflammation.  There is bilateral nonobstructive nephrolithiasis.  The largest calcified calculus lower pole of the right kidney measures 9 mm. The largest nonobstructive calculus in the upper pole of the left kidney measures 2 mm.  There is mild right hydronephrosis and right hydroureter.  Mild right periureteral stranding.  A calcified calculus is noted in the right UVJ/urinary bladder wall measures 6 mm.  This is best visualized in coronal image 56. Additional calculi are noted in distal right ureter measuring 2.7 and 2.8 mm best visualized in axial image 71.  In axial image 41 there is a calcified calculus in proximal right ureter just below the right UPJ measures 3.4 mm.  No left ureteral calculi are noted.  The uterus is retroflexed.  No pelvic ascites or adenopathy.  No inguinal adenopathy. The urinary bladder is under distended.  No destructive bony lesions are noted within pelvis.  IMPRESSION:  1.  Bilateral nonobstructive nephrolithiasis. 2.  Status post cholecystectomy. 3.  There is mild right hydronephrosis and right hydroureter.  A calcified calculus in proximal right ureter measures 3.4 mm.  There is a calculus in the right UVJ measures 6 mm.  Additional smaller calculi are noted in distal right ureter just proximal to  right UVJ. 4.  No left ureteral calculi are noted. 5.  Normal appendix is clearly visualized.  Original Report Authenticated By: Natasha Mead, M.D.   Dg Retrograde Pyelogram  08/04/2011  *RADIOLOGY REPORT*  Clinical Data: Right ureteral stone.  RETROGRADE PYELOGRAM  Comparison: CT 08/01/2011  Findings: First set of images from a right retrograde pyelogram  demonstrates no definite visible filling defect in the right ureter.  The previously seen small ureteral stones cannot be visualized.  Slight prominence of the right renal pelvis and calyces.  Second set of images demonstrate multiple filling defects, most of which are felt represent air bubbles.  Small persistent filling defect seen within the proximal right ureter may reflect a small retained right ureteral stone.  IMPRESSION: As discussed above.  Original Report Authenticated By: Cyndie Chime, M.D.   Dg Chest Portable 1 View  08/06/2011  *RADIOLOGY REPORT*  Clinical Data: Aspiration  PORTABLE CHEST - 1 VIEW  Comparison: Nine 5011  Findings: Improving right lower lobe aeration.  Normal heart size and vascularity.  No current airspace disease, collapse, consolidation, edema, effusion or pneumothorax.  IMPRESSION: Improving right lower lobe aeration.  No current active chest disease.  Original Report Authenticated By: Judie Petit. Ruel Favors, M.D.   Dg Chest Portable 1 View  08/05/2011  *RADIOLOGY REPORT*  Clinical Data: Aspiration  PORTABLE CHEST - 1 VIEW  Comparison: 08/04/2011  Findings: Mild patchy right lower lobe opacity, increasing, likely reflecting aspiration.  Left lung is essentially clear. No pleural effusion or pneumothorax.  Or mediastinal silhouette is within normal limits.  IMPRESSION: Mild patchy right lower lobe opacity, increasing, likely reflecting aspiration.  Original Report Authenticated By: Charline Bills, M.D.   Dg Chest Portable 1 View  08/04/2011  *RADIOLOGY REPORT*  Clinical Data: Aspiration during surgery  PORTABLE CHEST - 1 VIEW  Comparison: 12/03/2009  Findings: Minimal right basilar opacity.  Scarring versus atelectasis in the right mid lung.  No focal consolidation.  Left lung is essentially clear. No pleural effusion or pneumothorax.  The heart is top normal in size.  IMPRESSION: Minimal right basilar opacity.  No focal consolidation.  Scarring versus atelectasis in the right  midlung.  Original Report Authenticated By: Charline Bills, M.D.    Lab Results: Results for orders placed during the hospital encounter of 08/01/11 (from the past 48 hour(s))  CBC     Status: Abnormal   Collection Time   08/06/11  4:53 AM      Component Value Range Comment   WBC 23.0 (*) 4.0 - 10.5 (K/uL)    RBC 3.92  3.87 - 5.11 (MIL/uL)    Hemoglobin 12.1  12.0 - 15.0 (g/dL)    HCT 29.5 (*) 62.1 - 46.0 (%)    MCV 91.3  78.0 - 100.0 (fL)    MCH 30.9  26.0 - 34.0 (pg)    MCHC 33.8  30.0 - 36.0 (g/dL)    RDW 30.8  65.7 - 84.6 (%)    Platelets 318  150 - 400 (K/uL)   DIFFERENTIAL     Status: Abnormal   Collection Time   08/06/11  4:53 AM      Component Value Range Comment   Neutrophils Relative 86 (*) 43 - 77 (%)    Neutro Abs 19.8 (*) 1.7 - 7.7 (K/uL)    Lymphocytes Relative 7 (*) 12 - 46 (%)    Lymphs Abs 1.6  0.7 - 4.0 (K/uL)    Monocytes Relative 7  3 - 12 (%)  Monocytes Absolute 1.6 (*) 0.1 - 1.0 (K/uL)    Eosinophils Relative 0  0 - 5 (%)    Eosinophils Absolute 0.0  0.0 - 0.7 (K/uL)    Basophils Relative 0  0 - 1 (%)    Basophils Absolute 0.0  0.0 - 0.1 (K/uL)   BASIC METABOLIC PANEL     Status: Abnormal   Collection Time   08/06/11  4:53 AM      Component Value Range Comment   Sodium 137  135 - 145 (mEq/L)    Potassium 4.0  3.5 - 5.1 (mEq/L)    Chloride 105  96 - 112 (mEq/L)    CO2 28  19 - 32 (mEq/L)    Glucose, Bld 155 (*) 70 - 99 (mg/dL)    BUN 14  6 - 23 (mg/dL)    Creatinine, Ser 9.62  0.50 - 1.10 (mg/dL)    Calcium 8.9  8.4 - 10.5 (mg/dL)    GFR calc non Af Amer >60  >60 (mL/min)    GFR calc Af Amer >60  >60 (mL/min)   BLOOD GAS, ARTERIAL     Status: Abnormal   Collection Time   08/06/11  9:04 AM      Component Value Range Comment   FIO2 0.21      Delivery systems ROOM AIR      pH, Arterial 7.418 (*) 7.350 - 7.400     pCO2 arterial 39.4  35.0 - 45.0 (mmHg)    pO2, Arterial 68.9 (*) 80.0 - 100.0 (mmHg)    Bicarbonate 24.9 (*) 20.0 - 24.0 (mEq/L)    TCO2  22.5  0 - 100 (mmol/L)    Acid-Base Excess 0.9  0.0 - 2.0 (mmol/L)    O2 Saturation 94.3      Patient temperature 37.0      Collection site LEFT RADIAL      Drawn by [id number]      Sample type ARTERIAL DRAW      Allens test (pass/fail) PASS  PASS     Recent Results (from the past 240 hour(s))  MRSA PCR SCREENING     Status: Normal   Collection Time   08/04/11  2:45 PM      Component Value Range Status Comment   MRSA by PCR NEGATIVE  NEGATIVE  Final      Hospital Course: She was brought into the hospital for surgery for kidney stones. During her surgery she vomited and aspirated. This required her to be in the intensive care unit for approximately 48 hours and she was treated with intravenous antibiotics inhaled bronchodilators and steroids. She improved markedly and was ready for discharge was markedly improved chest congestion.  Discharge Exam: Blood pressure 132/84, pulse 76, temperature 97.3 F (36.3 C), temperature source Oral, resp. rate 20, height 5\' 6"  (1.676 m), weight 129.4 kg (285 lb 4.4 oz), last menstrual period 07/11/2011, SpO2 94.00%. Her chest still shows some rhonchi but is much improved  Disposition: Home    Follow-up Information    Follow up with [facility].   Contact information:   [address] [place] [address] [phone]          Signed: Fredirick Maudlin 08/07/2011, 8:57 AM

## 2011-08-07 NOTE — Progress Notes (Signed)
Subjective: She says she feels better and wants to go home.  Objective: Vital signs in last 24 hours: Temp:  [97.3 F (36.3 C)-98.3 F (36.8 C)] 97.3 F (36.3 C) (09/07 0658) Pulse Rate:  [76-91] 76  (09/07 0658) Resp:  [14-29] 20  (09/07 0658) BP: (89-135)/(50-90) 132/84 mmHg (09/07 0658) SpO2:  [94 %-99 %] 94 % (09/07 0658) Weight change:  Last BM Date: 08/06/11  Intake/Output from previous day: 09/06 0701 - 09/07 0700 In: 920 [P.O.:720; IV Piggyback:200] Out: 1600 [Urine:1600]  PHYSICAL EXAM General appearance: alert, cooperative and no distress Resp: rhonchi bilaterally Cardio: regular rate and rhythm, S1, S2 normal, no murmur, click, rub or gallop GI: soft, non-tender; bowel sounds normal; no masses,  no organomegaly Extremities: extremities normal, atraumatic, no cyanosis or edema  Lab Results:    Basic Metabolic Panel:  Basename 08/06/11 0453 08/05/11 0424  NA 137 131*  K 4.0 4.4  CL 105 99  CO2 28 25  GLUCOSE 155* 162*  BUN 14 12  CREATININE 0.70 0.75  CALCIUM 8.9 8.8  MG -- --  PHOS -- --   Liver Function Tests: No results found for this basename: AST:2,ALT:2,ALKPHOS:2,BILITOT:2,PROT:2,ALBUMIN:2 in the last 72 hours No results found for this basename: LIPASE:2,AMYLASE:2 in the last 72 hours No results found for this basename: AMMONIA:2 in the last 72 hours CBC:  Basename 08/06/11 0453  WBC 23.0*  NEUTROABS 19.8*  HGB 12.1  HCT 35.8*  MCV 91.3  PLT 318   Cardiac Enzymes: No results found for this basename: CKTOTAL:3,CKMB:3,CKMBINDEX:3,TROPONINI:3 in the last 72 hours BNP: No results found for this basename: POCBNP:3 in the last 72 hours D-Dimer: No results found for this basename: DDIMER:2 in the last 72 hours CBG: No results found for this basename: GLUCAP:6 in the last 72 hours Hemoglobin A1C: No results found for this basename: HGBA1C in the last 72 hours Fasting Lipid Panel: No results found for this basename:  CHOL,HDL,LDLCALC,TRIG,CHOLHDL,LDLDIRECT in the last 72 hours Thyroid Function Tests: No results found for this basename: TSH,T4TOTAL,FREET4,T3FREE,THYROIDAB in the last 72 hours Anemia Panel: No results found for this basename: VITAMINB12,FOLATE,FERRITIN,TIBC,IRON,RETICCTPCT in the last 72 hours Urine Drug Screen:  Alcohol Level: No results found for this basename: ETH:2 in the last 72 hours Urinalysis:  Misc. Labs:  ABGS  Basename 08/06/11 0904  PHART 7.418*  PO2ART 68.9*  TCO2 22.5  HCO3 24.9*   CULTURES Recent Results (from the past 240 hour(s))  MRSA PCR SCREENING     Status: Normal   Collection Time   08/04/11  2:45 PM      Component Value Range Status Comment   MRSA by PCR NEGATIVE  NEGATIVE  Final    Studies/Results: Dg Chest Portable 1 View  08/06/2011  *RADIOLOGY REPORT*  Clinical Data: Aspiration  PORTABLE CHEST - 1 VIEW  Comparison: [facility] 5011  Findings: Improving right lower lobe aeration.  Normal heart size and vascularity.  No current airspace disease, collapse, consolidation, edema, effusion or pneumothorax.  IMPRESSION: Improving right lower lobe aeration.  No current active chest disease.  Original Report Authenticated By: Judie Petit. Ruel Favors, M.D.    Medications:  Prior to Admission:  Prescriptions prior to admission  Medication Sig Dispense Refill  . albuterol (PROVENTIL HFA;VENTOLIN HFA) 108 (90 BASE) MCG/ACT inhaler Inhale 2 puffs into the lungs every 6 (six) hours as needed. For asthma       . albuterol (PROVENTIL) (2.5 MG/3ML) 0.083% nebulizer solution Take 2.5 mg by nebulization every 6 (six) hours as needed.  For asthma       . ibuprofen (ADVIL,MOTRIN) 200 MG tablet Take 400 mg by mouth every 6 (six) hours as needed. For pain        Scheduled:   . albuterol  2.5 mg Nebulization BID   And  . ipratropium  0.5 mg Nebulization BID  . ampicillin-sulbactam (UNASYN) IV  1.5 g Intravenous Q6H  . castor oil  30 mL Oral Once  . docusate sodium  100 mg Oral BID    . magnesium citrate  300 mL Oral Once  . methylPREDNISolone sodium succinate  125 mg Intravenous Q6H  . sodium chloride      . sodium phosphate  1 enema Rectal Once   Continuous:  ZOX:[id number], alum & mag hydroxide-simeth, HYDROmorphone, ondansetron, DISCONTD: HYDROmorphone  Assesment: She had aspiration of gastric contents during surgery. She is much improved her chest is much clearer she looks better and she wants to be discharged Principal Problem:  *Kidney stones Active Problems:  Aspiration of gastric contents  COPD (chronic obstructive pulmonary disease)    Plan: Discharge home today to see discharge summary for details    LOS: 6 days   Anacristina Steffek L 08/07/2011, 8:52 AM

## 2011-08-10 ENCOUNTER — Encounter (HOSPITAL_COMMUNITY): Payer: Self-pay | Admitting: Urology

## 2011-08-11 NOTE — ED Provider Notes (Addendum)
Medical screening examination/treatment/procedure(s) were conducted as a shared visit with non-physician practitioner(s) and myself.  I personally evaluated the patient during the encounter   Benny Lennert, MD 08/11/11 1040       pe pe tender right flank  Benny Lennert, MD 08/14/11 [phone]

## 2011-08-18 ENCOUNTER — Emergency Department (HOSPITAL_COMMUNITY): Payer: Self-pay

## 2011-08-18 ENCOUNTER — Emergency Department (HOSPITAL_COMMUNITY)
Admission: EM | Admit: 2011-08-18 | Discharge: 2011-08-18 | Disposition: A | Discharge status: Home / Self Care | Payer: Self-pay | Attending: Emergency Medicine | Admitting: Emergency Medicine

## 2011-08-18 ENCOUNTER — Encounter (HOSPITAL_COMMUNITY): Payer: Self-pay

## 2011-08-18 DIAGNOSIS — Z888 Allergy status to other drugs, medicaments and biological substances status: Secondary | ICD-10-CM | POA: Insufficient documentation

## 2011-08-18 DIAGNOSIS — J4489 Other specified chronic obstructive pulmonary disease: Secondary | ICD-10-CM | POA: Insufficient documentation

## 2011-08-18 DIAGNOSIS — F17200 Nicotine dependence, unspecified, uncomplicated: Secondary | ICD-10-CM | POA: Insufficient documentation

## 2011-08-18 DIAGNOSIS — M549 Dorsalgia, unspecified: Secondary | ICD-10-CM

## 2011-08-18 DIAGNOSIS — R0602 Shortness of breath: Secondary | ICD-10-CM | POA: Insufficient documentation

## 2011-08-18 DIAGNOSIS — N200 Calculus of kidney: Secondary | ICD-10-CM | POA: Insufficient documentation

## 2011-08-18 DIAGNOSIS — J40 Bronchitis, not specified as acute or chronic: Secondary | ICD-10-CM

## 2011-08-18 DIAGNOSIS — J449 Chronic obstructive pulmonary disease, unspecified: Secondary | ICD-10-CM | POA: Insufficient documentation

## 2011-08-18 LAB — COMPREHENSIVE METABOLIC PANEL
AST: 11 U/L (ref 0–37)
BUN: 10 mg/dL (ref 6–23)
CO2: 24 mEq/L (ref 19–32)
Calcium: 8.5 mg/dL (ref 8.4–10.5)
Chloride: 105 mEq/L (ref 96–112)
Creatinine, Ser: 0.72 mg/dL (ref 0.50–1.10)
GFR calc Af Amer: >60 mL/min (ref >60)
GFR calc non Af Amer: >60 mL/min (ref >60)
Glucose, Bld: 92 mg/dL (ref 70–99)
Total Bilirubin: 0.2 mg/dL — ABNORMAL LOW (ref 0.3–1.2)

## 2011-08-18 LAB — PREGNANCY, URINE: Preg Test, Ur: NEGATIVE

## 2011-08-18 LAB — URINE MICROSCOPIC-ADD ON

## 2011-08-18 LAB — CBC
HCT: 39.2 % (ref 36.0–46.0)
Hemoglobin: 12.8 g/dL (ref 12.0–15.0)
MCH: 29.8 pg (ref 26.0–34.0)
MCV: 91.4 fL (ref 78.0–100.0)
RBC: 4.29 MIL/uL (ref 3.87–5.11)
WBC: 12.5 10*3/uL — ABNORMAL HIGH (ref 4.0–10.5)

## 2011-08-18 LAB — LIPASE, BLOOD: Lipase: 29 U/L (ref 11–59)

## 2011-08-18 LAB — URINALYSIS, ROUTINE W REFLEX MICROSCOPIC
Bilirubin Urine: NEGATIVE
Nitrite: NEGATIVE
Protein, ur: NEGATIVE mg/dL
Specific Gravity, Urine: 1.025 (ref 1.005–1.030)
Urobilinogen, UA: 0.2 mg/dL (ref 0.0–1.0)

## 2011-08-18 MED ORDER — KETOROLAC TROMETHAMINE 30 MG/ML IJ SOLN
30.0000 mg | Freq: Once | INTRAMUSCULAR | Status: AC
  Administered 2011-08-18: 30 mg via INTRAVENOUS
  Filled 2011-08-18: qty 1

## 2011-08-18 MED ORDER — DIAZEPAM 5 MG PO TABS: 5.0000 mg | ORAL_TABLET | Freq: Two times a day (BID) | ORAL | Status: AC

## 2011-08-18 MED ORDER — ALBUTEROL SULFATE (5 MG/ML) 0.5% IN NEBU
5.0000 mg | INHALATION_SOLUTION | Freq: Once | RESPIRATORY_TRACT | Status: AC
  Administered 2011-08-18: 5 mg via RESPIRATORY_TRACT

## 2011-08-18 MED ORDER — ALBUTEROL SULFATE HFA 108 (90 BASE) MCG/ACT IN AERS
2.0000 | INHALATION_SPRAY | Freq: Once | RESPIRATORY_TRACT | Status: AC
  Administered 2011-08-18: 2 via RESPIRATORY_TRACT
  Filled 2011-08-18: qty 6.7

## 2011-08-18 MED ORDER — HYDROMORPHONE HCL 1 MG/ML IJ SOLN
1.0000 mg | Freq: Once | INTRAMUSCULAR | Status: AC
  Administered 2011-08-18: 1 mg via INTRAVENOUS
  Filled 2011-08-18: qty 1

## 2011-08-18 MED ORDER — ONDANSETRON HCL 4 MG/2ML IJ SOLN
4.0000 mg | Freq: Once | INTRAMUSCULAR | Status: AC
  Administered 2011-08-18: 4 mg via INTRAVENOUS
  Filled 2011-08-18: qty 2

## 2011-08-18 MED ORDER — PREDNISONE 50 MG PO TABS: 50.0000 mg | ORAL_TABLET | Freq: Every day | ORAL | Status: AC

## 2011-08-18 NOTE — ED Provider Notes (Signed)
Scribed for Celene Kras, MD, the patient was seen in room APA06/APA06 . This chart was scribed by Ellie Lunch. This patient's care was started at 3:19 PM.   CSN: [id number] Arrival date & time: 08/18/2011  1:34 PM   Chief Complaint  Patient presents with  . Flank Pain  . Shortness of Breath    (Include location/radiation/quality/duration/timing/severity/associated sxs/prior treatment) HPI [name] is a 35 y.o. female brought in by ambulance Emergency Department complaining of Left flank pain radiating to LLQ starting today with associated nausea. Pt has hx of kidney stones and reports symptoms are similar to previous episodes with kidney stones. Pt also has a history of COPD and reports a cough and SOB. Denies vomiting. Denies dysuria, hematuria. Pt has not smoked in 5 days. Has used inhaler.  Pt was in hospital 1.5 weeks ago for removal of kidney stones.    Past Medical History  Diagnosis Date  . Kidney stones   . COPD (chronic obstructive pulmonary disease)      Past Surgical History  Procedure Date  . Cholecystectomy   . Tubal ligation   . Cystoscopy w/ retrogrades 08/04/2011    Procedure: CYSTOSCOPY WITH RETROGRADE PYELOGRAM;  Surgeon: Ky Barban;  Location: [facility];  Service: Urology;  Laterality: Right;  Right Retrograde    Family History  Problem Relation Age of Onset  . COPD Mother   . COPD Father   . Heart attack Father     History  Substance Use Topics  . Smoking status: Current Everyday Smoker -- 0.5 packs/day  . Smokeless tobacco: Not on file  . Alcohol Use: No    OB History    Grav Para Term Preterm Abortions TAB SAB Ect Mult Living   3 3 3       3       Review of Systems 10 Systems reviewed and are negative for acute change except as noted in the HPI.   Allergies  Hydrocodone and Morphine and related  Home Medications   Current Outpatient Rx  Name Route Sig Dispense Refill  . ALBUTEROL SULFATE HFA 108 (90 BASE) MCG/ACT IN AERS  Inhalation Inhale 2 puffs into the lungs every 6 (six) hours as needed. For asthma     . ALBUTEROL SULFATE (2.5 MG/3ML) 0.083% IN NEBU Nebulization Take 2.5 mg by nebulization every 6 (six) hours as needed. For asthma     . AMOXICILLIN-POT CLAVULANATE 875-125 MG PO TABS Oral Take 1 tablet by mouth 2 (two) times daily.      . IBUPROFEN 200 MG PO TABS Oral Take 400 mg by mouth every 6 (six) hours as needed. For pain    . OXYCODONE-ACETAMINOPHEN 10-325 MG PO TABS Oral Take 1 tablet by mouth every 4 (four) hours as needed. Pain     . AMOXICILLIN-POT CLAVULANATE 875-125 MG PO TABS Oral Take 1 tablet by mouth 2 (two) times daily. 20 tablet 0  . OXYCODONE-ACETAMINOPHEN 10-325 MG PO TABS Oral Take 1 tablet by mouth every 4 (four) hours as needed for pain. 30 tablet 0    Physical Exam    BP 143/106  Pulse 90  Temp(Src) 97.8 F (36.6 C) (Oral)  Resp 20  Ht 5\' 6"  (1.676 m)  Wt 235 lb (106.595 kg)  BMI 37.93 kg/m2  SpO2 98%  LMP 08/11/2011  Physical Exam  Nursing note and vitals reviewed. Constitutional: She appears well-developed and well-nourished.       Appears uncomfortable.   HENT:  Head: Normocephalic  and atraumatic.  Right Ear: External ear normal.  Left Ear: External ear normal.  Eyes: Conjunctivae are normal. Right eye exhibits no discharge. Left eye exhibits no discharge. No scleral icterus.  Neck: Neck supple. No tracheal deviation present.  Cardiovascular: Normal rate, regular rhythm and intact distal pulses.   Pulmonary/Chest: Effort normal. No stridor. No respiratory distress. She has wheezes (expiratory wheezes noted). She has no rales.  Abdominal: Soft. Bowel sounds are normal. She exhibits no distension. There is no tenderness. There is no rebound and no guarding.  Musculoskeletal: She exhibits tenderness (Left lower back tenderness. Slight Left CVA tenderness.). She exhibits no edema.  Neurological: She is alert. She has normal strength. No sensory deficit. Cranial nerve  deficit:  no gross defecits noted. She exhibits normal muscle tone. She displays no seizure activity. Coordination normal.  Skin: Skin is warm and dry. No rash noted.  Psychiatric: She has a normal mood and affect.   Procedures  OTHER DATA REVIEWED: Nursing notes, vital signs, and past medical records reviewed.   DIAGNOSTIC STUDIES: Oxygen Saturation is 98% on room air, normal by my interpretation.    LABS / RADIOLOGY:  Labs Reviewed  URINALYSIS, ROUTINE W REFLEX MICROSCOPIC - Abnormal; Notable for the following:    Appearance HAZY (*)    Hgb urine dipstick TRACE (*)    All other components within normal limits  URINE MICROSCOPIC-ADD ON - Abnormal; Notable for the following:    Squamous Epithelial / LPF MANY (*)    All other components within normal limits  CBC - Abnormal; Notable for the following:    WBC 12.5 (*)    All other components within normal limits  COMPREHENSIVE METABOLIC PANEL - Abnormal; Notable for the following:    Albumin 3.2 (*)    Total Bilirubin 0.2 (*)    All other components within normal limits  LIPASE, BLOOD  PREGNANCY, URINE   Ct Abdomen Pelvis Wo Contrast  08/18/2011  *RADIOLOGY REPORT*  Clinical Data: 35 year old female with left flank, abdominal and pelvic pain.  CT ABDOMEN AND PELVIS WITHOUT CONTRAST  Technique:  Multidetector CT imaging of the abdomen and pelvis was performed following the standard protocol without intravenous contrast.  Comparison: 08/01/2011 and prior CTs  Findings: An 8 mm nonobstructing right lower pole renal calculus is identified.  A few punctate nonobstructing renal calculi are identified in the left lower pole. Increased density/calcification within the medullary pyramids bilaterally are noted likely representing renal tubular ectasia/lingular sponge kidney. There is no evidence of hydronephrosis or obstructing urinary calculi.  The liver, spleen, adrenal glands, and pancreas are unremarkable. Cholecystectomy noted.  Please note  that parenchymal abnormalities may be missed as intravenous contrast was not administered. No free fluid, enlarged lymph nodes, biliary dilation or abdominal aortic aneurysm identified.  The bowel and bladder are unremarkable. The appendix is normal.  A small hiatal hernia is identified.  No acute or suspicious bony abnormalities are noted.  IMPRESSION: No acute abnormalities.  Nonobstructing bilateral renal calculi and findings suggestive of renal tubular ectasia/medullary sponge kidney.  Small hiatal hernia.  Original Report Authenticated By: Rosendo Gros, M.D.   Dg Chest 2 View  08/18/2011  *RADIOLOGY REPORT*  Clinical Data: Cough.  Bilateral flank pain.  CHEST - 2 VIEW  Comparison: 08/06/2011.  Findings: Lungs clear.  Cardiopericardial silhouette within normal limits.  Trachea midline.  No airspace disease or effusion. Small metallic density is present project over the midline of the upper abdomen measuring 38 mm, not seen on  the lateral view.  This may be external to the patient.  This was not seen on prior examinations. Cholecystectomy clips are noted on the lateral view.  IMPRESSION: No active cardiopulmonary disease. 38 mm metallic density in the midline of the upper abdomen is probably external to the patient.  Original Report Authenticated By: Andreas Newport, M.D.   Dg Abd 1 View  08/18/2011  *RADIOLOGY REPORT*  Clinical Data: Bilateral flank pain and cough  ABDOMEN - 1 VIEW  Comparison: 08/01/2011  Findings: No disproportionate dilatation of bowel.  Prominent stool in the rectum.  Stable bony framework.  Unremarkable soft tissues.  IMPRESSION: Nonobstructive bowel gas pattern.  Original Report Authenticated By: Donavan Burnet, M.D.   ED COURSE / COORDINATION OF CARE: 15:22 EDP discussed treatment plan with PT including blood work, abd and chest x rays, and breathing treatment. Discussed UA results not indicating kidney stones or kidney infection.  17:07: Pt re-check. EDP discussed lab/radiology  results. No indication of pneumonia. Discussed CT CT of abdomen to rule out kidney stones.   MDM: The patient without signs of acute urinary tract infection/pyelonephritis. There is no signs of recurrent pneumonia. Patient does not seem to pass the kidney stone. There is no sign of infection. Patient is low risk for an epidural abscess. She may have musculoskeletal type back pain, in addition to her bronchitis symptoms.  Explain the findings with the patient. I will discharge her home with a muscle relaxant pain medication as well as a albuterol inhaler and prednisone.  SCRIBE ATTESTATION: I personally performed the services described in this documentation, which was scribed in my presence.  The recorded information has been reviewed and considered.        Celene Kras, MD 08/18/11 1800

## 2011-08-18 NOTE — ED Notes (Signed)
Per ems, pt called out for left flank pain and sob.  Pt was d/c from here two weeks ago from ICU for aspirating during surgery.  Per ems, pt has expiratory wheezing and was given a neb treatment on the truck.

## 2011-08-21 LAB — BLOOD GAS, ARTERIAL
O2 Content: 21
pCO2 arterial: 28.2 — ABNORMAL LOW
pH, Arterial: 7.403 — ABNORMAL HIGH
pO2, Arterial: 73.5 — ABNORMAL LOW

## 2011-08-21 LAB — COMPREHENSIVE METABOLIC PANEL
Alkaline Phosphatase: 75
BUN: 13
Chloride: 106
Creatinine, Ser: 0.96
Glucose, Bld: 291 — ABNORMAL HIGH
Potassium: 3.4 — ABNORMAL LOW
Total Bilirubin: 0.4

## 2011-08-21 LAB — DIFFERENTIAL
Basophils Absolute: 0
Basophils Relative: 0
Basophils Relative: 0
Lymphocytes Relative: 10 — ABNORMAL LOW
Lymphocytes Relative: 8 — ABNORMAL LOW
Monocytes Relative: 3
Neutro Abs: 3.7
Neutro Abs: 9.7 — ABNORMAL HIGH
Neutrophils Relative %: 87 — ABNORMAL HIGH
Neutrophils Relative %: 90 — ABNORMAL HIGH

## 2011-08-21 LAB — HEMOGLOBIN A1C: Mean Plasma Glucose: 122

## 2011-08-21 LAB — CBC
HCT: 39
Hemoglobin: 13.7
MCHC: 34.3
MCV: 88.5
Platelets: 215
RBC: 4.27
RDW: 13.4
WBC: 11.2 — ABNORMAL HIGH
WBC: 4.1

## 2011-08-21 LAB — BASIC METABOLIC PANEL
CO2: 25
Calcium: 8.9
Creatinine, Ser: 0.88
GFR calc Af Amer: >60
GFR calc non Af Amer: >60

## 2011-08-21 LAB — INFLUENZA A+B VIRUS AG-DIRECT(RAPID): Influenza B Ag: NEGATIVE

## 2011-08-31 LAB — BASIC METABOLIC PANEL
BUN: 11
BUN: 19
BUN: 5 — ABNORMAL LOW
CO2: 23
CO2: 27
Calcium: 8.6
Calcium: 8.6
Calcium: 8.6
Calcium: 8.7
Calcium: 8.7
Chloride: 105
Chloride: 108
Creatinine, Ser: 0.77
Creatinine, Ser: 0.82
Creatinine, Ser: 0.85
Creatinine, Ser: 0.87
Creatinine, Ser: 0.92
GFR calc Af Amer: >60
GFR calc Af Amer: >60
GFR calc Af Amer: >60
GFR calc Af Amer: >60
GFR calc non Af Amer: >60
GFR calc non Af Amer: >60
GFR calc non Af Amer: >60
GFR calc non Af Amer: >60
Glucose, Bld: 148 — ABNORMAL HIGH
Glucose, Bld: 160 — ABNORMAL HIGH
Potassium: 3.4 — ABNORMAL LOW
Potassium: 3.6
Potassium: 4.4
Sodium: 138
Sodium: 139
Sodium: 140

## 2011-08-31 LAB — CBC
HCT: 41.7
HCT: 44.1
Hemoglobin: 11.8 — ABNORMAL LOW
Hemoglobin: 12.8
Hemoglobin: 14.1
MCHC: 33.8
MCHC: 34.1
MCHC: 34.2
MCHC: 34.7
MCV: 91.4
MCV: 91.6
Platelets: 182
Platelets: 226
Platelets: 232
Platelets: 345
RBC: 3.83 — ABNORMAL LOW
RBC: 4.07
RBC: 4.54
RBC: 4.83
RDW: 13.6
RDW: 13.7
WBC: 15.8 — ABNORMAL HIGH
WBC: 5.1
WBC: 6.4

## 2011-08-31 LAB — DIFFERENTIAL
Basophils Absolute: 0
Basophils Absolute: 0
Basophils Relative: 0
Basophils Relative: 0
Basophils Relative: 0
Basophils Relative: 0
Eosinophils Absolute: 0
Eosinophils Absolute: 0.3
Eosinophils Relative: 2
Lymphocytes Relative: 12
Lymphocytes Relative: 36
Lymphocytes Relative: 7 — ABNORMAL LOW
Lymphocytes Relative: 7 — ABNORMAL LOW
Lymphs Abs: 0.7
Lymphs Abs: 1.8
Lymphs Abs: 2.2
Lymphs Abs: 2.3
Lymphs Abs: 4.8 — ABNORMAL HIGH
Monocytes Absolute: 0.4
Monocytes Absolute: 0.5
Monocytes Relative: 3
Monocytes Relative: 4
Monocytes Relative: 5
Neutro Abs: 12.6 — ABNORMAL HIGH
Neutro Abs: 13.3 — ABNORMAL HIGH
Neutro Abs: 14.1 — ABNORMAL HIGH
Neutro Abs: 16.7 — ABNORMAL HIGH
Neutro Abs: 5.5
Neutrophils Relative %: 52
Neutrophils Relative %: 65
Neutrophils Relative %: 80 — ABNORMAL HIGH
Neutrophils Relative %: 85 — ABNORMAL HIGH
Neutrophils Relative %: 89 — ABNORMAL HIGH
Neutrophils Relative %: 91 — ABNORMAL HIGH

## 2011-08-31 LAB — BLOOD GAS, ARTERIAL
FIO2: 21
Patient temperature: 37
TCO2: 22
pCO2 arterial: 38.9
pH, Arterial: 7.423 — ABNORMAL HIGH

## 2011-08-31 LAB — GLUCOSE, CAPILLARY
Glucose-Capillary: 152 — ABNORMAL HIGH
Glucose-Capillary: 154 — ABNORMAL HIGH
Glucose-Capillary: 162 — ABNORMAL HIGH
Glucose-Capillary: 162 — ABNORMAL HIGH
Glucose-Capillary: 168 — ABNORMAL HIGH
Glucose-Capillary: 236 — ABNORMAL HIGH

## 2011-08-31 LAB — URINALYSIS, ROUTINE W REFLEX MICROSCOPIC
Bilirubin Urine: NEGATIVE
Glucose, UA: NEGATIVE
Ketones, ur: NEGATIVE
Nitrite: POSITIVE — AB
Protein, ur: 100 — AB
Urobilinogen, UA: 1
pH: 7

## 2011-08-31 LAB — URINE CULTURE
Colony Count: NO GROWTH
Culture: NO GROWTH

## 2011-08-31 LAB — URINE MICROSCOPIC-ADD ON

## 2011-08-31 LAB — MAGNESIUM: Magnesium: 1.7

## 2011-09-02 LAB — CBC
HCT: 36.3
HCT: 40.8
Hemoglobin: 12.4
Hemoglobin: 12.4
Hemoglobin: 13.9
MCHC: 34.1
MCHC: 34.3
MCHC: 34.6
MCV: 90.8
MCV: 90.9
MCV: 91.3
Platelets: 200
RBC: 4
RBC: 4.29
RBC: 4.49
RDW: 12.9
RDW: 13.3
WBC: 8.5

## 2011-09-02 LAB — BASIC METABOLIC PANEL
BUN: 12
CO2: 27
CO2: 27
CO2: 28
Calcium: 8.4
Calcium: 8.5
Chloride: 106
Chloride: 106
Chloride: 109
Creatinine, Ser: 0.87
GFR calc Af Amer: >60
GFR calc Af Amer: >60
GFR calc Af Amer: >60
GFR calc non Af Amer: >60
Glucose, Bld: 103 — ABNORMAL HIGH
Glucose, Bld: 87
Glucose, Bld: 95
Sodium: 137
Sodium: 137
Sodium: 139
Sodium: 139

## 2011-09-02 LAB — DIFFERENTIAL
Basophils Absolute: 0
Basophils Absolute: 0
Basophils Relative: 0
Basophils Relative: 1
Basophils Relative: 1
Eosinophils Absolute: 0.1
Eosinophils Absolute: 0.1
Eosinophils Absolute: 0.1
Eosinophils Relative: 1
Eosinophils Relative: 2
Eosinophils Relative: 2
Lymphocytes Relative: 35
Lymphs Abs: 1.6
Monocytes Absolute: 0.4
Monocytes Absolute: 0.4
Monocytes Absolute: 0.8
Monocytes Relative: 5
Monocytes Relative: 6
Monocytes Relative: 7
Neutro Abs: 3.9
Neutro Abs: 6.4
Neutro Abs: 8.6 — ABNORMAL HIGH
Neutrophils Relative %: 75

## 2011-09-02 LAB — URINALYSIS, ROUTINE W REFLEX MICROSCOPIC
Nitrite: POSITIVE — AB
Protein, ur: >300 — AB
Urobilinogen, UA: >8 — ABNORMAL HIGH

## 2011-09-02 LAB — PREGNANCY, URINE: Preg Test, Ur: NEGATIVE

## 2011-09-02 LAB — URINE CULTURE

## 2011-09-06 NOTE — ED Provider Notes (Signed)
Medical screening examination/treatment/procedure(s) were performed by non-physician practitioner and as supervising physician I was immediately available for consultation/collaboration.   Jarquez Mestre L Lasaundra Riche, MD 09/06/11 2234 

## 2011-09-11 LAB — URINE MICROSCOPIC-ADD ON

## 2011-09-11 LAB — COMPREHENSIVE METABOLIC PANEL
ALT: 22
AST: 19
Calcium: 9.3
GFR calc Af Amer: >60
Sodium: 141
Total Protein: 6.9

## 2011-09-11 LAB — DIFFERENTIAL
Eosinophils Absolute: 0.1
Lymphocytes Relative: 31
Lymphs Abs: 3.1
Monocytes Relative: 5
Neutrophils Relative %: 62

## 2011-09-11 LAB — URINALYSIS, ROUTINE W REFLEX MICROSCOPIC
Glucose, UA: NEGATIVE
Leukocytes, UA: NEGATIVE
pH: 6

## 2011-09-11 LAB — STONE ANALYSIS: Stone Weight KSTONE: 4.5 mg

## 2011-09-11 LAB — CBC
MCHC: 33.9
RDW: 13.4

## 2011-09-15 LAB — DIFFERENTIAL
Basophils Relative: 0
Basophils Relative: 0
Eosinophils Absolute: 0.2
Eosinophils Relative: 2
Lymphs Abs: 1.7
Lymphs Abs: 2.5
Monocytes Absolute: 0.8 — ABNORMAL HIGH
Monocytes Relative: 5
Monocytes Relative: 7
Neutro Abs: 13.2 — ABNORMAL HIGH
Neutrophils Relative %: 67
Neutrophils Relative %: 84 — ABNORMAL HIGH

## 2011-09-15 LAB — URINE MICROSCOPIC-ADD ON

## 2011-09-15 LAB — CULTURE, BLOOD (ROUTINE X 2): Culture: NO GROWTH

## 2011-09-15 LAB — URINALYSIS, ROUTINE W REFLEX MICROSCOPIC
Glucose, UA: NEGATIVE
Specific Gravity, Urine: >1.03 — ABNORMAL HIGH
pH: 6

## 2011-09-15 LAB — BASIC METABOLIC PANEL
BUN: 14
BUN: 3 — ABNORMAL LOW
Calcium: 8.9
Chloride: 110
Creatinine, Ser: 0.84
GFR calc Af Amer: >60
GFR calc Af Amer: >60
GFR calc non Af Amer: >60
Potassium: 4.2
Sodium: 142

## 2011-09-15 LAB — CBC
HCT: 36.6
MCHC: 34
MCHC: 34.1
MCV: 88.6
Platelets: 253
RBC: 4.13
RBC: 4.76
WBC: 10.5
WBC: 15.8 — ABNORMAL HIGH

## 2011-09-15 LAB — COMPREHENSIVE METABOLIC PANEL
BUN: 8
CO2: 29
Calcium: 8 — ABNORMAL LOW
Chloride: 108
Creatinine, Ser: 0.84
GFR calc Af Amer: >60
GFR calc non Af Amer: >60
Glucose, Bld: 116 — ABNORMAL HIGH
Total Bilirubin: 0.9

## 2011-09-15 LAB — URINE CULTURE

## 2011-09-16 LAB — PREGNANCY, URINE: Preg Test, Ur: NEGATIVE

## 2011-09-16 LAB — BASIC METABOLIC PANEL
BUN: 12
Chloride: 104
Creatinine, Ser: 0.91
Glucose, Bld: 99
Potassium: 4

## 2011-09-16 LAB — URINE MICROSCOPIC-ADD ON

## 2011-09-16 LAB — STONE ANALYSIS: Stone Weight KSTONE: 10 mg

## 2011-09-16 LAB — CBC
HCT: 40.2
MCV: 89
Platelets: 266
RDW: 13.8

## 2011-09-16 LAB — DIFFERENTIAL
Basophils Absolute: 0.1
Basophils Relative: 1
Eosinophils Absolute: 0.1
Eosinophils Relative: 1
Neutrophils Relative %: 67

## 2011-09-16 LAB — URINALYSIS, ROUTINE W REFLEX MICROSCOPIC
Bilirubin Urine: NEGATIVE
Ketones, ur: NEGATIVE
Leukocytes, UA: NEGATIVE
Nitrite: NEGATIVE
Specific Gravity, Urine: >1.03 — ABNORMAL HIGH
Urobilinogen, UA: 0.2
pH: 6

## 2011-09-23 ENCOUNTER — Emergency Department (HOSPITAL_COMMUNITY): Payer: Self-pay

## 2011-09-23 ENCOUNTER — Inpatient Hospital Stay (HOSPITAL_COMMUNITY)
Admission: EM | Admit: 2011-09-23 | Discharge: 2011-09-30 | DRG: 191 | Disposition: A | Discharge status: Home / Self Care | Payer: Self-pay | Attending: Internal Medicine | Admitting: Internal Medicine

## 2011-09-23 ENCOUNTER — Encounter (HOSPITAL_COMMUNITY): Payer: Self-pay

## 2011-09-23 ENCOUNTER — Other Ambulatory Visit: Payer: Self-pay

## 2011-09-23 DIAGNOSIS — F17200 Nicotine dependence, unspecified, uncomplicated: Secondary | ICD-10-CM | POA: Diagnosis present

## 2011-09-23 DIAGNOSIS — J440 Chronic obstructive pulmonary disease with acute lower respiratory infection: Secondary | ICD-10-CM | POA: Diagnosis present

## 2011-09-23 DIAGNOSIS — J449 Chronic obstructive pulmonary disease, unspecified: Secondary | ICD-10-CM

## 2011-09-23 DIAGNOSIS — J209 Acute bronchitis, unspecified: Secondary | ICD-10-CM | POA: Diagnosis present

## 2011-09-23 DIAGNOSIS — F411 Generalized anxiety disorder: Secondary | ICD-10-CM | POA: Diagnosis present

## 2011-09-23 DIAGNOSIS — F419 Anxiety disorder, unspecified: Secondary | ICD-10-CM | POA: Diagnosis present

## 2011-09-23 DIAGNOSIS — J441 Chronic obstructive pulmonary disease with (acute) exacerbation: Principal | ICD-10-CM | POA: Diagnosis present

## 2011-09-23 DIAGNOSIS — D72829 Elevated white blood cell count, unspecified: Secondary | ICD-10-CM

## 2011-09-23 HISTORY — DX: Essential (primary) hypertension: I10

## 2011-09-23 LAB — DIFFERENTIAL
Basophils Absolute: 0 10*3/uL (ref 0.0–0.1)
Basophils Relative: 0 % (ref 0–1)
Monocytes Absolute: 0.5 10*3/uL (ref 0.1–1.0)
Neutro Abs: 7.5 10*3/uL (ref 1.7–7.7)

## 2011-09-23 LAB — BASIC METABOLIC PANEL
Calcium: 9.3 mg/dL (ref 8.4–10.5)
Chloride: 104 mEq/L (ref 96–112)
Creatinine, Ser: 0.96 mg/dL (ref 0.50–1.10)
GFR calc Af Amer: 87 mL/min — ABNORMAL LOW (ref >90)
GFR calc non Af Amer: 75 mL/min — ABNORMAL LOW (ref >90)

## 2011-09-23 LAB — CBC
HCT: 40 % (ref 36.0–46.0)
MCHC: 33 g/dL (ref 30.0–36.0)
RDW: 13.3 % (ref 11.5–15.5)

## 2011-09-23 MED ORDER — ALBUTEROL SULFATE (5 MG/ML) 0.5% IN NEBU
2.5000 mg | INHALATION_SOLUTION | Freq: Once | RESPIRATORY_TRACT | Status: AC
  Administered 2011-09-23: 2.5 mg via RESPIRATORY_TRACT
  Filled 2011-09-23 (×2): qty 0.5

## 2011-09-23 MED ORDER — SODIUM CHLORIDE 0.9 % IV SOLN
INTRAVENOUS | Status: DC
  Administered 2011-09-23 – 2011-09-25 (×3): via INTRAVENOUS

## 2011-09-23 MED ORDER — ALBUTEROL SULFATE (5 MG/ML) 0.5% IN NEBU
2.5000 mg | INHALATION_SOLUTION | Freq: Once | RESPIRATORY_TRACT | Status: AC
  Administered 2011-09-23: 2.5 mg via RESPIRATORY_TRACT
  Filled 2011-09-23: qty 0.5

## 2011-09-23 MED ORDER — IPRATROPIUM BROMIDE 0.02 % IN SOLN
RESPIRATORY_TRACT | Status: AC
  Filled 2011-09-23: qty 5

## 2011-09-23 MED ORDER — IPRATROPIUM BROMIDE 0.02 % IN SOLN
0.5000 mg | Freq: Once | RESPIRATORY_TRACT | Status: AC
  Administered 2011-09-23: 0.5 mg via RESPIRATORY_TRACT
  Filled 2011-09-23: qty 2.5

## 2011-09-23 MED ORDER — ALBUTEROL SULFATE (5 MG/ML) 0.5% IN NEBU
INHALATION_SOLUTION | RESPIRATORY_TRACT | Status: AC
  Filled 2011-09-23: qty 1

## 2011-09-23 MED ORDER — METHYLPREDNISOLONE SODIUM SUCC 125 MG IJ SOLR
INTRAMUSCULAR | Status: AC
  Filled 2011-09-23: qty 2

## 2011-09-23 NOTE — ED Notes (Signed)
Per ems, pt was having difficulty breathing upon arrival to pt's home.  Pt has expiratory wheezing, and cough upon arrival.  Pt has hx of COPD and asthma and reports that she has been out of her meds for over 2 mos.  Pt reports having a nebulizer at home.  Pt reports that "i feel like i've been running a fever".

## 2011-09-23 NOTE — ED Notes (Signed)
Per ems, pt received 2 neb treatments on truck and solumedrol on the truck with minimal relief.

## 2011-09-23 NOTE — ED Notes (Signed)
Paged respiratory, she called back, on the way

## 2011-09-23 NOTE — ED Provider Notes (Signed)
Scribed for [employer]. Rubin Payor, MD, the patient was seen in room APA08/APA08. This chart was scribed by [employer]. The patient's care started at 21:48  CSN: [id number] Arrival date & time: 09/23/2011  9:48 PM   First MD Initiated Contact with Patient 09/23/11 2148      Chief Complaint  Patient presents with  . Shortness of Breath   HPI [name] is a 35 y.o. female brought in by ambulance with a history of COPD and Asthma who presents to the Emergency Department complaining of Shortness of breath. EMS gave her berathing treatment upon arrival at patient's home without much relief. Reports using nebulizer treatments at home and has been out of her medications for over two months. Reports associated fever.  Past Medical History  Diagnosis Date  . Kidney stones   . COPD (chronic obstructive pulmonary disease)   . Asthma   . Hypertension     Past Surgical History  Procedure Date  . Cholecystectomy   . Tubal ligation   . Cystoscopy w/ retrogrades 08/04/2011    Procedure: CYSTOSCOPY WITH RETROGRADE PYELOGRAM;  Surgeon: Ky Barban;  Location: [facility];  Service: Urology;  Laterality: Right;  Right Retrograde    Family History  Problem Relation Age of Onset  . COPD Mother   . COPD Father   . Heart attack Father     History  Substance Use Topics  . Smoking status: Current Everyday Smoker -- 0.5 packs/day  . Smokeless tobacco: Not on file  . Alcohol Use: No    OB History    Grav Para Term Preterm Abortions TAB SAB Ect Mult Living   3 3 3       3       Review of Systems  Constitutional: Positive for fever.  Respiratory: Positive for shortness of breath.   All other systems reviewed and are negative.    Allergies  Hydrocodone; Morphine and related; and Vicodin  Home Medications   Current Outpatient Rx  Name Route Sig Dispense Refill  . ALBUTEROL SULFATE HFA 108 (90 BASE) MCG/ACT IN AERS Inhalation Inhale 2 puffs into the lungs every 6 (six) hours  as needed. For asthma     . IBUPROFEN 200 MG PO TABS Oral Take 400 mg by mouth every 6 (six) hours as needed. For pain    . ALBUTEROL SULFATE (2.5 MG/3ML) 0.083% IN NEBU Nebulization Take 2.5 mg by nebulization every 6 (six) hours as needed. For asthma     . AMOXICILLIN-POT CLAVULANATE 875-125 MG PO TABS Oral Take 1 tablet by mouth 2 (two) times daily.      . OXYCODONE-ACETAMINOPHEN 10-325 MG PO TABS Oral Take 1 tablet by mouth every 4 (four) hours as needed. Pain       BP 135/94  Pulse 133  Temp(Src) 98.4 F (36.9 C) (Oral)  Resp 25  Ht 5\' 6"  (1.676 m)  Wt 230 lb (104.327 kg)  BMI 37.12 kg/m2  SpO2 94%  Physical Exam  Nursing note and vitals reviewed. Constitutional: She is oriented to person, place, and time. She appears well-developed and well-nourished. No distress.       Obese   HENT:  Head: Normocephalic and atraumatic.  Right Ear: External ear normal.  Left Ear: External ear normal.  Mouth/Throat: No oropharyngeal exudate.  Eyes: Conjunctivae are normal. Right eye exhibits no discharge. Left eye exhibits no discharge. No scleral icterus.  Neck: Neck supple. No tracheal deviation present.  Cardiovascular: Normal rate, regular rhythm and intact  distal pulses.   Pulmonary/Chest: No stridor. No respiratory distress. She has wheezes (Prolonged expiratory). She has rales (throughout).       Harsh sound through out  Abdominal: Soft. Bowel sounds are normal. She exhibits no distension. There is no tenderness. There is no rebound and no guarding.  Musculoskeletal: Normal range of motion. She exhibits no edema and no tenderness.       Patient with tracking bracelet on her ankle  Neurological: She is alert and oriented to person, place, and time. She has normal strength. No cranial nerve deficit or sensory deficit. She exhibits normal muscle tone. She displays no seizure activity. Coordination normal.  Skin: Skin is warm and dry. No rash noted. She is not diaphoretic.  Psychiatric:  She has a normal mood and affect. Her behavior is normal.    ED Course  Procedures (including critical care time)  Labs Reviewed  BASIC METABOLIC PANEL - Abnormal; Notable for the following:    Glucose, Bld 130 (*)    GFR calc non Af Amer 75 (*)    GFR calc Af Amer 87 (*)    All other components within normal limits  CBC  DIFFERENTIAL   Dg Chest Portable 1 View  09/23/2011  *RADIOLOGY REPORT*  Clinical Data: Dyspnea.  Cough.  PORTABLE CHEST - 1 VIEW  Comparison: 1912  Findings: X-ray artifact projecting over the upper left chest. Midline trachea.  Normal heart size and mediastinal contours. No pleural effusion or pneumothorax.  Clear lungs.  IMPRESSION: Normal chest.  Original Report Authenticated By: Consuello Bossier, M.D.     1. COPD (chronic obstructive pulmonary disease)     DIAGNOSTIC STUDIES: Oxygen Saturation is 94% on room air, normal by my interpretation.    COORDINATION OF CARE: 21:27 - EDP examined patient at bedside. IV fluids, blood work, CXR and breathing treatment ordered  Date: 09/23/2011  Rate: 118  Rhythm: sinus tachycardia  QRS Axis: normal  Intervals: normal  ST/T Wave abnormalities: normal  Conduction Disutrbances:none  Narrative Interpretation:   Old EKG Reviewed: none available     MDM  Shortness of breath for the last few days. Cough and wheezing. She's been out of her meds. No relief with breathing treatments by EMS her here. Tachycardia has improved, which remains harsh with wheezes. She required admission.   Scribe Attestation I personally performed the services described in this documentation, which was scribed in my presence. The recorded information has been reviewed and considered.       Juliet Rude. Rubin Payor, MD 09/23/11 2332

## 2011-09-23 NOTE — H&P (Signed)
[name] is an 35 y.o. female.    PCP: No primary provider on file. She used to see [facility]. However, due to financial reasons she has not been able to see them.  Chief Complaint: Shortness of breath and cough for 2 days  HPI: This is a 35 year old, Caucasian female, who is obese, with history of nephrolithiasis, and COPD, who, unfortunately, continues to smoke, who presents with two-day history of a dry cough. Wheezing started this morning. She's been very short of breath. She's had sweating episodes. However, denies any fever. Denies any sick contacts. She gets chest pain, when she coughs. She denies any nausea, vomiting. Denies any leg swelling. She said she hasn't smoked in the last 2 weeks, but before that she was smoking about half pack every day. She tried taking albuterol inhaler along with the Robitussin and TheraFlu. However, she did not get any relief and so decided to come into the ED.   Prior to Admission medications   Medication Sig Start Date End Date Taking? Authorizing Provider  albuterol (PROVENTIL HFA;VENTOLIN HFA) 108 (90 BASE) MCG/ACT inhaler Inhale 2 puffs into the lungs every 6 (six) hours as needed. For asthma    Yes Historical Provider, MD  ibuprofen (ADVIL,MOTRIN) 200 MG tablet Take 400 mg by mouth every 6 (six) hours as needed. For pain   Yes Historical Provider, MD  Potassium Citrate (UROCIT-K 15 PO) Take 1 capsule by mouth daily.     Yes Historical Provider, MD  albuterol (PROVENTIL) (2.5 MG/3ML) 0.083% nebulizer solution Take 2.5 mg by nebulization every 6 (six) hours as needed. For asthma     Historical Provider, MD  amoxicillin-clavulanate (AUGMENTIN) 875-125 MG per tablet Take 1 tablet by mouth 2 (two) times daily.      Historical Provider, MD  oxyCODONE-acetaminophen (PERCOCET) 10-325 MG per tablet Take 1 tablet by mouth every 4 (four) hours as needed. Pain     Historical Provider, MD    Allergies:  Allergies  Allergen  Reactions  . Hydrocodone Nausea And Vomiting  . Morphine And Related Hives    Welps  . Vicodin (Hydrocodone-Acetaminophen) Nausea And Vomiting    Past Medical History  Diagnosis Date  . Kidney stones   . COPD (chronic obstructive pulmonary disease)   . Asthma   . Hypertension     Past Surgical History  Procedure Date  . Cholecystectomy   . Tubal ligation   . Cystoscopy w/ retrogrades 08/04/2011    Procedure: CYSTOSCOPY WITH RETROGRADE PYELOGRAM;  Surgeon: Ky Barban;  Location: [facility];  Service: Urology;  Laterality: Right;  Right Retrograde    Social History:  reports that she has been smoking.  She does not have any smokeless tobacco history on file. She reports that she does not drink alcohol or use illicit drugs.  Family History:  Family History  Problem Relation Age of Onset  . COPD Mother   . COPD Father   . Heart attack Father     Review of Systems  Constitutional: Positive for malaise/fatigue. Negative for fever, chills, weight loss and diaphoresis.  HENT: Negative.   Eyes: Negative.   Respiratory: Positive for cough, shortness of breath and wheezing. Negative for sputum production.   Cardiovascular: Negative.   Genitourinary: Negative.   Musculoskeletal: Negative.   Skin: Negative.   Neurological: Negative.  Negative for weakness.  Endo/Heme/Allergies: Negative.   Psychiatric/Behavioral: Negative.      Blood pressure 139/87, pulse 119, temperature 98.4 F (36.9 C), temperature  source Oral, resp. rate 17, height 5\' 6"  (1.676 m), weight 104.327 kg (230 lb), SpO2 96.00%. Physical Exam  Vitals reviewed. Constitutional: She is oriented to person, place, and time. She appears well-developed and well-nourished. No distress.  HENT:  Head: Normocephalic and atraumatic.  Mouth/Throat: Oropharynx is clear and moist. No oropharyngeal exudate.  Eyes: EOM are normal. Pupils are equal, round, and reactive to light. Right eye exhibits no discharge. Left eye  exhibits no discharge. No scleral icterus.  Neck: Normal range of motion. Neck supple. No JVD present. No tracheal deviation present. No thyromegaly present.  Cardiovascular: Regular rhythm.  Tachycardia present.  Exam reveals no gallop and no friction rub.   No murmur heard. Pulmonary/Chest: No accessory muscle usage or stridor. Tachypnea noted. She has wheezes in the right upper field, the right middle field, the right lower field, the left upper field, the left middle field and the left lower field. She has no rhonchi. She has no rales.  Abdominal: Soft. Bowel sounds are normal. She exhibits no distension. There is no tenderness. There is no rebound and no guarding.  Musculoskeletal: Normal range of motion. She exhibits no edema and no tenderness.  Neurological: She is alert and oriented to person, place, and time. No cranial nerve deficit.  Skin: Skin is warm and dry. She is not diaphoretic. No erythema.  Psychiatric: She has a normal mood and affect.     Results for orders placed during the hospital encounter of 09/23/11 (from the past 48 hour(s))  CBC     Status: Normal   Collection Time   09/23/11 10:04 PM      Component Value Range Comment   WBC 10.2  4.0 - 10.5 (K/uL)    RBC 4.41  3.87 - 5.11 (MIL/uL)    Hemoglobin 13.2  12.0 - 15.0 (g/dL)    HCT 78.2  95.6 - 21.3 (%)    MCV 90.7  78.0 - 100.0 (fL)    MCH 29.9  26.0 - 34.0 (pg)    MCHC 33.0  30.0 - 36.0 (g/dL)    RDW 08.6  57.8 - 46.9 (%)    Platelets 254  150 - 400 (K/uL)   DIFFERENTIAL     Status: Normal   Collection Time   09/23/11 10:04 PM      Component Value Range Comment   Neutrophils Relative 73  43 - 77 (%)    Neutro Abs 7.5  1.7 - 7.7 (K/uL)    Lymphocytes Relative 21  12 - 46 (%)    Lymphs Abs 2.1  0.7 - 4.0 (K/uL)    Monocytes Relative 4  3 - 12 (%)    Monocytes Absolute 0.5  0.1 - 1.0 (K/uL)    Eosinophils Relative 2  0 - 5 (%)    Eosinophils Absolute 0.2  0.0 - 0.7 (K/uL)    Basophils Relative 0  0 - 1  (%)    Basophils Absolute 0.0  0.0 - 0.1 (K/uL)   BASIC METABOLIC PANEL     Status: Abnormal   Collection Time   09/23/11 10:04 PM      Component Value Range Comment   Sodium 137  135 - 145 (mEq/L)    Potassium 3.6  3.5 - 5.1 (mEq/L)    Chloride 104  96 - 112 (mEq/L)    CO2 23  19 - 32 (mEq/L)    Glucose, Bld 130 (*) 70 - 99 (mg/dL)    BUN 11  6 - 23 (mg/dL)  Creatinine, Ser 0.96  0.50 - 1.10 (mg/dL)    Calcium 9.3  8.4 - 10.5 (mg/dL)    GFR calc non Af Amer 75 (*) >90 (mL/min)    GFR calc Af Amer 87 (*) >90 (mL/min)    Dg Chest Portable 1 View  09/23/2011  *RADIOLOGY REPORT*  Clinical Data: Dyspnea.  Cough.  PORTABLE CHEST - 1 VIEW  Comparison: 1912  Findings: X-ray artifact projecting over the upper left chest. Midline trachea.  Normal heart size and mediastinal contours. No pleural effusion or pneumothorax.  Clear lungs.  IMPRESSION: Normal chest.  Original Report Authenticated By: Consuello Bossier, M.D.   EKG shows a sinus tachycardia at 118. Normal axis. Intervals appear to be in the normal range. No Q waves. No concerning ST or T-wave changes are noted. There may be some rate-related changes in this EKG.  Assessment/Plan  Principal Problem:  *Acute bronchitis Active Problems:  COPD (chronic obstructive pulmonary disease)   #1 acute bronchitis: She'll be treated with the nebulizer treatments, steroids, and antibiotics. Tessalon will be utilized to control her cough. Tobacco cessation will be emphasized. While she is on steroids her glucose levels will be monitored.  #2 history of COPD: Please see above.  #3 history of nephrolithiasis appears to be stable. Continue with urocit k.  DVT, prophylaxis will utilize.  She's a full code.  Further management decisions will depend on results of further testing and patient's response to treatment.  Oseias Horsey 09/23/2011, 11:53 PM

## 2011-09-24 DIAGNOSIS — J441 Chronic obstructive pulmonary disease with (acute) exacerbation: Secondary | ICD-10-CM | POA: Diagnosis present

## 2011-09-24 LAB — GLUCOSE, CAPILLARY

## 2011-09-24 LAB — COMPREHENSIVE METABOLIC PANEL
Albumin: 3.3 g/dL — ABNORMAL LOW (ref 3.5–5.2)
Alkaline Phosphatase: 89 U/L (ref 39–117)
BUN: 12 mg/dL (ref 6–23)
Chloride: 104 mEq/L (ref 96–112)
GFR calc Af Amer: >90 mL/min (ref >90)
Glucose, Bld: 260 mg/dL — ABNORMAL HIGH (ref 70–99)
Potassium: 4 mEq/L (ref 3.5–5.1)
Total Bilirubin: 0.1 mg/dL — ABNORMAL LOW (ref 0.3–1.2)

## 2011-09-24 LAB — CBC
MCV: 90.7 fL (ref 78.0–100.0)
Platelets: 249 10*3/uL (ref 150–400)
RDW: 13.3 % (ref 11.5–15.5)
WBC: 7.7 10*3/uL (ref 4.0–10.5)

## 2011-09-24 LAB — CARDIAC PANEL(CRET KIN+CKTOT+MB+TROPI): Troponin I: <0.3 ng/mL (ref <0.30)

## 2011-09-24 MED ORDER — POTASSIUM CITRATE ER 10 MEQ (1080 MG) PO TBCR
10.0000 meq | EXTENDED_RELEASE_TABLET | Freq: Every day | ORAL | Status: DC
  Administered 2011-09-25 – 2011-09-30 (×4): 10 meq via ORAL
  Filled 2011-09-24 (×9): qty 1

## 2011-09-24 MED ORDER — ALBUTEROL SULFATE (5 MG/ML) 0.5% IN NEBU
10.0000 mg | INHALATION_SOLUTION | Freq: Once | RESPIRATORY_TRACT | Status: AC
  Administered 2011-09-24: 10 mg via RESPIRATORY_TRACT
  Filled 2011-09-24: qty 20
  Filled 2011-09-24: qty 2

## 2011-09-24 MED ORDER — BENZONATATE 100 MG PO CAPS
100.0000 mg | ORAL_CAPSULE | Freq: Three times a day (TID) | ORAL | Status: DC
  Administered 2011-09-24 – 2011-09-30 (×17): 100 mg via ORAL
  Filled 2011-09-24 (×17): qty 1

## 2011-09-24 MED ORDER — NITROGLYCERIN 0.4 MG SL SUBL: 0.4000 mg | SUBLINGUAL_TABLET | SUBLINGUAL | Status: DC | PRN

## 2011-09-24 MED ORDER — PNEUMOCOCCAL VAC POLYVALENT 25 MCG/0.5ML IJ INJ
0.5000 mL | INJECTION | INTRAMUSCULAR | Status: AC
  Filled 2011-09-24: qty 0.5

## 2011-09-24 MED ORDER — METHYLPREDNISOLONE SODIUM SUCC 125 MG IJ SOLR
80.0000 mg | Freq: Four times a day (QID) | INTRAMUSCULAR | Status: DC
  Administered 2011-09-24 – 2011-09-25 (×5): 81.25 mg via INTRAVENOUS
  Filled 2011-09-24 (×5): qty 2

## 2011-09-24 MED ORDER — ACETAMINOPHEN 325 MG PO TABS: 650.0000 mg | ORAL_TABLET | Freq: Four times a day (QID) | ORAL | Status: DC | PRN

## 2011-09-24 MED ORDER — ONDANSETRON HCL 4 MG/2ML IJ SOLN: 4.0000 mg | Freq: Four times a day (QID) | INTRAMUSCULAR | Status: DC | PRN

## 2011-09-24 MED ORDER — INFLUENZA VIRUS VACC SPLIT PF IM SUSP
0.5000 mL | Freq: Once | INTRAMUSCULAR | Status: DC
  Filled 2011-09-24: qty 0.5

## 2011-09-24 MED ORDER — METOPROLOL TARTRATE 1 MG/ML IV SOLN
2.5000 mg | Freq: Once | INTRAVENOUS | Status: AC
  Administered 2011-09-24: 2.5 mg via INTRAVENOUS

## 2011-09-24 MED ORDER — ALBUTEROL SULFATE (5 MG/ML) 0.5% IN NEBU
INHALATION_SOLUTION | RESPIRATORY_TRACT | Status: AC
  Filled 2011-09-24: qty 0.5

## 2011-09-24 MED ORDER — LEVOFLOXACIN IN D5W 750 MG/150ML IV SOLN
750.0000 mg | INTRAVENOUS | Status: DC
  Administered 2011-09-25: 750 mg via INTRAVENOUS
  Filled 2011-09-24 (×4): qty 150

## 2011-09-24 MED ORDER — LEVOFLOXACIN 500 MG PO TABS
500.0000 mg | ORAL_TABLET | Freq: Every day | ORAL | Status: DC
  Administered 2011-09-24: 500 mg via ORAL
  Filled 2011-09-24: qty 1

## 2011-09-24 MED ORDER — ONDANSETRON HCL 4 MG PO TABS: 4.0000 mg | ORAL_TABLET | Freq: Four times a day (QID) | ORAL | Status: DC | PRN

## 2011-09-24 MED ORDER — ALPRAZOLAM 0.25 MG PO TABS
0.2500 mg | ORAL_TABLET | Freq: Three times a day (TID) | ORAL | Status: DC | PRN
  Administered 2011-09-24 – 2011-09-28 (×9): 0.25 mg via ORAL
  Filled 2011-09-24 (×10): qty 1

## 2011-09-24 MED ORDER — SENNA 8.6 MG PO TABS
2.0000 | ORAL_TABLET | Freq: Every day | ORAL | Status: DC | PRN
  Administered 2011-09-27: 17.2 mg via ORAL
  Filled 2011-09-24 (×2): qty 2

## 2011-09-24 MED ORDER — IPRATROPIUM BROMIDE 0.02 % IN SOLN
RESPIRATORY_TRACT | Status: AC
  Administered 2011-09-24: 0.5 mg via RESPIRATORY_TRACT
  Filled 2011-09-24: qty 2.5

## 2011-09-24 MED ORDER — GUAIFENESIN-DM 100-10 MG/5ML PO SYRP
5.0000 mL | ORAL_SOLUTION | ORAL | Status: DC | PRN
  Administered 2011-09-25 – 2011-09-30 (×9): 5 mL via ORAL
  Filled 2011-09-24 (×10): qty 5

## 2011-09-24 MED ORDER — BENZONATATE 100 MG PO CAPS
ORAL_CAPSULE | ORAL | Status: AC
  Administered 2011-09-24: 100 mg
  Filled 2011-09-24: qty 1

## 2011-09-24 MED ORDER — OXYCODONE HCL 5 MG PO TABS
5.0000 mg | ORAL_TABLET | ORAL | Status: DC | PRN
  Administered 2011-09-24 – 2011-09-30 (×17): 5 mg via ORAL
  Filled 2011-09-24 (×18): qty 1

## 2011-09-24 MED ORDER — SODIUM CHLORIDE 0.9 % IJ SOLN
INTRAMUSCULAR | Status: AC
  Filled 2011-09-24: qty 10

## 2011-09-24 MED ORDER — METOPROLOL TARTRATE 1 MG/ML IV SOLN
INTRAVENOUS | Status: AC
  Administered 2011-09-24: 2.5 mg via INTRAVENOUS
  Filled 2011-09-24: qty 5

## 2011-09-24 MED ORDER — IPRATROPIUM BROMIDE 0.02 % IN SOLN
0.5000 mg | RESPIRATORY_TRACT | Status: DC
  Administered 2011-09-24 – 2011-09-30 (×34): 0.5 mg via RESPIRATORY_TRACT
  Filled 2011-09-24 (×35): qty 2.5

## 2011-09-24 MED ORDER — INSULIN ASPART 100 UNIT/ML ~~LOC~~ SOLN
0.0000 [IU] | Freq: Three times a day (TID) | SUBCUTANEOUS | Status: DC
  Administered 2011-09-24: 3 [IU] via SUBCUTANEOUS
  Administered 2011-09-25: 2 [IU] via SUBCUTANEOUS
  Administered 2011-09-25 (×2): 3 [IU] via SUBCUTANEOUS
  Administered 2011-09-26 (×2): 2 [IU] via SUBCUTANEOUS
  Administered 2011-09-27: 3 [IU] via SUBCUTANEOUS
  Administered 2011-09-28 (×3): 2 [IU] via SUBCUTANEOUS
  Administered 2011-09-29: 3 [IU] via SUBCUTANEOUS
  Administered 2011-09-29 – 2011-09-30 (×3): 2 [IU] via SUBCUTANEOUS
  Filled 2011-09-24: qty 3

## 2011-09-24 MED ORDER — ACETAMINOPHEN 650 MG RE SUPP: 650.0000 mg | Freq: Four times a day (QID) | RECTAL | Status: DC | PRN

## 2011-09-24 MED ORDER — LEVALBUTEROL HCL 0.63 MG/3ML IN NEBU: 0.6300 mg | INHALATION_SOLUTION | Freq: Four times a day (QID) | RESPIRATORY_TRACT | Status: DC

## 2011-09-24 MED ORDER — ASPIRIN EC 325 MG PO TBEC
325.0000 mg | DELAYED_RELEASE_TABLET | Freq: Every day | ORAL | Status: DC
  Administered 2011-09-24 – 2011-09-30 (×7): 325 mg via ORAL
  Filled 2011-09-24 (×7): qty 1

## 2011-09-24 MED ORDER — PANTOPRAZOLE SODIUM 40 MG IV SOLR
40.0000 mg | INTRAVENOUS | Status: DC
  Administered 2011-09-24 – 2011-09-25 (×2): 40 mg via INTRAVENOUS
  Filled 2011-09-24 (×2): qty 40

## 2011-09-24 MED ORDER — ALBUTEROL SULFATE (5 MG/ML) 0.5% IN NEBU
2.5000 mg | INHALATION_SOLUTION | RESPIRATORY_TRACT | Status: DC
Start: 2011-09-24 — End: 2011-09-24
  Administered 2011-09-24 (×3): 2.5 mg via RESPIRATORY_TRACT
  Filled 2011-09-24 (×2): qty 0.5

## 2011-09-24 MED ORDER — PROMETHAZINE HCL 12.5 MG PO TABS: 12.5000 mg | ORAL_TABLET | Freq: Four times a day (QID) | ORAL | Status: DC | PRN

## 2011-09-24 MED ORDER — SODIUM CHLORIDE 0.45 % IV SOLN
INTRAVENOUS | Status: AC
  Administered 2011-09-24: 04:00:00 via INTRAVENOUS

## 2011-09-24 MED ORDER — NITROGLYCERIN 0.4 MG SL SUBL
0.4000 mg | SUBLINGUAL_TABLET | Freq: Once | SUBLINGUAL | Status: AC
  Administered 2011-09-24: 0.4 mg via SUBLINGUAL
  Filled 2011-09-24: qty 25

## 2011-09-24 MED ORDER — ENOXAPARIN SODIUM 40 MG/0.4ML ~~LOC~~ SOLN
40.0000 mg | SUBCUTANEOUS | Status: DC
  Administered 2011-09-24 – 2011-09-27 (×4): 40 mg via SUBCUTANEOUS
  Filled 2011-09-24 (×4): qty 0.4
  Filled 2011-09-24: qty 0.8

## 2011-09-24 MED ORDER — LEVALBUTEROL HCL 0.63 MG/3ML IN NEBU
0.6300 mg | INHALATION_SOLUTION | RESPIRATORY_TRACT | Status: DC
  Administered 2011-09-24 – 2011-09-30 (×30): 0.63 mg via RESPIRATORY_TRACT
  Filled 2011-09-24 (×31): qty 3

## 2011-09-24 MED ORDER — DOCUSATE SODIUM 100 MG PO CAPS
100.0000 mg | ORAL_CAPSULE | Freq: Two times a day (BID) | ORAL | Status: DC
  Administered 2011-09-24 – 2011-09-30 (×12): 100 mg via ORAL
  Filled 2011-09-24 (×16): qty 1

## 2011-09-24 MED ORDER — PROMETHAZINE HCL 25 MG/ML IJ SOLN: 12.5000 mg | Freq: Four times a day (QID) | INTRAMUSCULAR | Status: DC | PRN

## 2011-09-24 MED ORDER — ALBUTEROL SULFATE (5 MG/ML) 0.5% IN NEBU
2.5000 mg | INHALATION_SOLUTION | RESPIRATORY_TRACT | Status: DC | PRN
  Administered 2011-09-30: 2.5 mg via RESPIRATORY_TRACT
  Filled 2011-09-24 (×2): qty 0.5

## 2011-09-24 NOTE — Progress Notes (Signed)
Patient received a continuous nebulizer (albuterol 10 mg) this afternoon and post treatment heart rate was 145 bpm.  Patient was placed on a heart monitor while on the nebulizer but the nurse or I was not notified that her heart rate had increased.  Patient's heart rate has decreased some to 120's-130's bpm did get an EKG per MD.  Will continue to monitor.

## 2011-09-24 NOTE — ED Notes (Signed)
Report rcd from p.m. Shift.  Pt. Lying on carrier with HOB elevated  B/P 127/67  HR  116  P.O. 96%  R 24--noticed her becoming very SOA when trying to converse---Audible expiratory wheeze---Breakfast tray served---

## 2011-09-24 NOTE — ED Notes (Signed)
Pt. Sleeping---B/P 102/67  HR 110---R 20--Pulse ox88%--awakens with verbal--placed on 02 at 2 liters --pulse ox quickly reaching 96-97%--Dr. Adriana Simas notified

## 2011-09-24 NOTE — Progress Notes (Addendum)
Subjective: Patient is still short of breath, she claims she hasn't worsened but not much improvement either.  Objective: Vital signs in last 24 hours: Temp:  [98.4 F (36.9 C)] 98.4 F (36.9 C) (10/25 1304) Pulse Rate:  [108-133] 124  (10/25 1304) Resp:  [17-26] 22  (10/25 1304) BP: (109-139)/(52-94) 119/79 mmHg (10/25 1304) SpO2:  [93 %-96 %] 93 % (10/25 1304) Weight:  [104.327 kg (230 lb)] 230 lb (104.327 kg) (10/24 2140) Weight change:  Body mass index is 37.12 kg/(m^2).  Intake/Output from previous day:     PHYSICAL EXAM: Gen Exam: Awake and alert with clear speech. She is dyspneic but is just about able to complete full sentences. She's not using any accessory muscles of respiration. Neck: Supple, No JVD.   Chest: B/L air entry at present, however patient has coarse expiratory rhonchi in all lung zones.  CVS: S1 S2 Regular, no murmurs.  Abdomen: soft, BS +, non tender, non distended.  Extremities: no edema, warm.   Neurologic: Non Focal.   Skin: No Rash.   Wounds: N/A.    Lab Results:  Basename 09/24/11 0437 09/23/11 2204  WBC 7.7 10.2  HGB 12.2 13.2  HCT 38.0 40.0  PLT 249 254   CMET CMP     Component Value Date/Time   NA 135 09/24/2011 0437   K 4.0 09/24/2011 0437   CL 104 09/24/2011 0437   CO2 19 09/24/2011 0437   GLUCOSE 260* 09/24/2011 0437   BUN 12 09/24/2011 0437   CREATININE 0.82 09/24/2011 0437   CALCIUM 9.6 09/24/2011 0437   PROT 7.5 09/24/2011 0437   ALBUMIN 3.3* 09/24/2011 0437   AST 12 09/24/2011 0437   ALT 17 09/24/2011 0437   ALKPHOS 89 09/24/2011 0437   BILITOT 0.1* 09/24/2011 0437   GFRNONAA >90 09/24/2011 0437   GFRAA >90 09/24/2011 0437     No results found for this basename: PT:2,INR:2 in the last 72 hours  Studies/Results: Dg Chest Portable 1 View  09/23/2011  *RADIOLOGY REPORT*  Clinical Data: Dyspnea.  Cough.  PORTABLE CHEST - 1 VIEW  Comparison: 1912  Findings: X-ray artifact projecting over the upper left chest. Midline  trachea.  Normal heart size and mediastinal contours. No pleural effusion or pneumothorax.  Clear lungs.  IMPRESSION: Normal chest.  Original Report Authenticated By: Consuello Bossier, M.D.    Medications:  Scheduled:   . albuterol  10 mg Nebulization Once  . albuterol  2.5 mg Nebulization Once  . albuterol  2.5 mg Nebulization Once  . albuterol  2.5 mg Nebulization Q4H  . albuterol      . albuterol      . benzonatate      . benzonatate  100 mg Oral TID  . docusate sodium  100 mg Oral BID  . enoxaparin  40 mg Subcutaneous Q24H  . insulin aspart  0-15 Units Subcutaneous TID WC  . ipratropium      . ipratropium  0.5 mg Nebulization Once  . ipratropium  0.5 mg Nebulization Q4H  . levofloxacin (LEVAQUIN) IV  750 mg Intravenous Q24H  . methylPREDNISolone sodium succinate  81.25 mg Intravenous Q6H  . methylPREDNISolone sodium succinate      . potassium citrate  10 mEq Oral Q0600  . DISCONTD: levofloxacin  500 mg Oral Daily   Continuous:   . sodium chloride 100 mL/hr at 09/24/11 0345  . sodium chloride 125 mL/hr at 09/23/11 2222    Assessment/Plan: Principal Problem:  *Acute exacerbation of COPD  with asthma -Patient seems to be essentially unchanged compared to yesterday. She notes she still has coarse wheezing we will try to give her a continuous nebulizer for one hour. I will continue her intravenous Solu-Medrol and will change her Levaquin to the intravenous route as well. We will continue to monitor her and follow her clinical course. -Will start her on incentive spirometry and flutter valve. -Will see and observe another 24 hours before pursuing further imaging and consultation.  Active Problems:  COPD (chronic obstructive pulmonary disease) -This is a chronic issue for the patient, unfortunately she continues to smoke. Rest as above.   Acute bronchitis -As above  Obesity: -She has been counseled regarding the need to lose weight.   Tobacco abuse: -She has been counseled  extensively regarding the importance of stopping tobacco use.  Disposition: -Remain inpatient.    Maretta Bees, MD. 09/24/2011, 1:55 PM  Addendum- A few hours ago following the patient receiving a continuous nebulization with albuterol for one hour, patient started to have palpitations and chest pain that was radiating to both arms. Heart rate was in the 130s to 140s. EKG done during that time showed sinus tachycardia. Patient was then given sublingual nitroglycerin, IV Lopressor and Xanax. Following this patient and felt much better and the heart rate did come down to the 110s to 120s. On exam patient's still has very coarse rhonchi and appears very anxious. I have placed for a pulmonary consult with Dr. Juanetta Gosling we'll move her down to the step down unit for further continued observation for the next 24 hours or so. cardiac enzymes will be cycled.

## 2011-09-24 NOTE — ED Notes (Signed)
B/P 136/83  HR 125  R 26  P.O. 96% on 2 liters--pt. Has just finished neb trts.  Report called to [facility], RN

## 2011-09-24 NOTE — ED Notes (Signed)
Pt. Awake--Oral temp 98.4--Audbile expiratory wheeze  B/P 136/83  HR 1087  R 24  P.O. 96% on 2 liters of 02.

## 2011-09-24 NOTE — ED Notes (Signed)
Neb trts in progress--tolerating well.

## 2011-09-24 NOTE — ED Notes (Signed)
Resp. There. Here to give breathing trts---ate 90% of breakfast--tolerating breathing trt well.

## 2011-09-25 ENCOUNTER — Inpatient Hospital Stay (HOSPITAL_COMMUNITY): Payer: Self-pay

## 2011-09-25 DIAGNOSIS — F419 Anxiety disorder, unspecified: Secondary | ICD-10-CM | POA: Diagnosis present

## 2011-09-25 DIAGNOSIS — D72829 Elevated white blood cell count, unspecified: Secondary | ICD-10-CM

## 2011-09-25 LAB — BASIC METABOLIC PANEL
CO2: 20 mEq/L (ref 19–32)
Calcium: 9.3 mg/dL (ref 8.4–10.5)
Creatinine, Ser: 0.73 mg/dL (ref 0.50–1.10)
Glucose, Bld: 174 mg/dL — ABNORMAL HIGH (ref 70–99)

## 2011-09-25 LAB — GLUCOSE, CAPILLARY: Glucose-Capillary: 136 mg/dL — ABNORMAL HIGH (ref 70–99)

## 2011-09-25 LAB — CBC
Hemoglobin: 12.8 g/dL (ref 12.0–15.0)
MCH: 30.1 pg (ref 26.0–34.0)
MCV: 91.5 fL (ref 78.0–100.0)
RBC: 4.25 MIL/uL (ref 3.87–5.11)

## 2011-09-25 LAB — CARDIAC PANEL(CRET KIN+CKTOT+MB+TROPI)
CK, MB: 2.1 ng/mL (ref 0.3–4.0)
CK, MB: 2.2 ng/mL (ref 0.3–4.0)
Relative Index: INVALID (ref 0.0–2.5)
Total CK: 77 U/L (ref 7–177)

## 2011-09-25 MED ORDER — PANTOPRAZOLE SODIUM 40 MG PO TBEC
40.0000 mg | DELAYED_RELEASE_TABLET | Freq: Every day | ORAL | Status: DC
  Administered 2011-09-26 – 2011-09-30 (×5): 40 mg via ORAL
  Filled 2011-09-25 (×5): qty 1

## 2011-09-25 MED ORDER — FLUCONAZOLE 100 MG PO TABS
150.0000 mg | ORAL_TABLET | Freq: Once | ORAL | Status: AC
  Administered 2011-09-25: 150 mg via ORAL
  Filled 2011-09-25: qty 2

## 2011-09-25 MED ORDER — SODIUM CHLORIDE 0.9 % IJ SOLN
INTRAMUSCULAR | Status: AC
  Filled 2011-09-25: qty 30

## 2011-09-25 MED ORDER — LEVOFLOXACIN 500 MG PO TABS
750.0000 mg | ORAL_TABLET | Freq: Every day | ORAL | Status: DC
  Administered 2011-09-25 – 2011-09-29 (×5): 750 mg via ORAL
  Filled 2011-09-25 (×3): qty 3
  Filled 2011-09-25 (×2): qty 1

## 2011-09-25 MED ORDER — GUAIFENESIN-CODEINE 100-10 MG/5ML PO SOLN
5.0000 mL | Freq: Four times a day (QID) | ORAL | Status: DC | PRN
  Administered 2011-09-25 – 2011-09-30 (×15): 5 mL via ORAL
  Filled 2011-09-25 (×14): qty 5

## 2011-09-25 MED ORDER — ZOLPIDEM TARTRATE 5 MG PO TABS
5.0000 mg | ORAL_TABLET | Freq: Every evening | ORAL | Status: DC | PRN
  Administered 2011-09-25 – 2011-09-26 (×2): 5 mg via ORAL
  Filled 2011-09-25 (×3): qty 1

## 2011-09-25 MED ORDER — METHYLPREDNISOLONE SODIUM SUCC 125 MG IJ SOLR
81.2500 mg | Freq: Four times a day (QID) | INTRAMUSCULAR | Status: DC
  Administered 2011-09-25 – 2011-09-26 (×2): 81.25 mg via INTRAMUSCULAR
  Filled 2011-09-25 (×2): qty 2

## 2011-09-25 NOTE — Progress Notes (Signed)

## 2011-09-25 NOTE — Progress Notes (Signed)
Subjective: Patient is still short of breath, claims to be better than yesterday. Still continues to have significant cough.  Objective: Vital signs in last 24 hours: Temp:  [98 F (36.7 C)-98.8 F (37.1 C)] 98.3 F (36.8 C) (10/26 0400) Pulse Rate:  [96-130] 98  (10/26 0400) Resp:  [17-27] 19  (10/26 0400) BP: (86-136)/(39-89) 127/89 mmHg (10/26 0400) SpO2:  [91 %-100 %] 95 % (10/26 0816) Weight:  [124.4 kg (274 lb 4 oz)-124.7 kg (274 lb 14.6 oz)] 274 lb 14.6 oz (124.7 kg) (10/26 0400) Weight change: 20.073 kg (44 lb 4 oz) Body mass index is 44.37 kg/(m^2).  Intake/Output from previous day: 10/25 0701 - 10/26 0700 In: 6004.2 [P.O.:2200; I.V.:3804.2] Out: -    PHYSICAL EXAM: Gen Exam: Awake and alert with clear speech. She is dyspneic but is just about able to complete full sentences. She's not using any accessory muscles of respiration. Neck: Supple, No JVD.   Chest: B/L air entry at present, however patient has expiratory rhonchi in all lung zones, but better than yesterday CVS: S1 S2 Regular, no murmurs.  Abdomen: soft, BS +, non tender, non distended.  Extremities: no edema, warm.   Neurologic: Non Focal.   Skin: No Rash.   Wounds: N/A.    Lab Results:  Basename 09/25/11 0749 09/24/11 0437  WBC 18.6* 7.7  HGB 12.8 12.2  HCT 38.9 38.0  PLT 271 249   CMET CMP     Component Value Date/Time   NA 135 09/24/2011 0437   K 4.0 09/24/2011 0437   CL 104 09/24/2011 0437   CO2 19 09/24/2011 0437   GLUCOSE 260* 09/24/2011 0437   BUN 12 09/24/2011 0437   CREATININE 0.82 09/24/2011 0437   CALCIUM 9.6 09/24/2011 0437   PROT 7.5 09/24/2011 0437   ALBUMIN 3.3* 09/24/2011 0437   AST 12 09/24/2011 0437   ALT 17 09/24/2011 0437   ALKPHOS 89 09/24/2011 0437   BILITOT 0.1* 09/24/2011 0437   GFRNONAA >90 09/24/2011 0437   GFRAA >90 09/24/2011 0437     No results found for this basename: PT:2,INR:2 in the last 72 hours  Studies/Results: Dg Chest Portable 1  View  09/25/2011  *RADIOLOGY REPORT*  Clinical Data: Short of breath  PORTABLE CHEST - 1 VIEW  Comparison: 09/23/2011  Findings: Normal heart size.  Clear lungs.  No pneumothorax.  No pleural effusion.  IMPRESSION: No active cardiopulmonary disease.  Original Report Authenticated By: Donavan Burnet, M.D.   Dg Chest Portable 1 View  09/23/2011  *RADIOLOGY REPORT*  Clinical Data: Dyspnea.  Cough.  PORTABLE CHEST - 1 VIEW  Comparison: 1912  Findings: X-ray artifact projecting over the upper left chest. Midline trachea.  Normal heart size and mediastinal contours. No pleural effusion or pneumothorax.  Clear lungs.  IMPRESSION: Normal chest.  Original Report Authenticated By: Consuello Bossier, M.D.    Medications:  Scheduled:    . albuterol  10 mg Nebulization Once  . albuterol      . albuterol      . aspirin EC  325 mg Oral Daily  . benzonatate      . benzonatate  100 mg Oral TID  . docusate sodium  100 mg Oral BID  . enoxaparin  40 mg Subcutaneous Q24H  . influenza  inactive virus vaccine  0.5 mL Intramuscular Once  . insulin aspart  0-15 Units Subcutaneous TID WC  . ipratropium      . ipratropium  0.5 mg Nebulization Q4H  .  levalbuterol  0.63 mg Nebulization Q4H  . levofloxacin (LEVAQUIN) IV  750 mg Intravenous Q24H  . methylPREDNISolone sodium succinate  81.25 mg Intravenous Q6H  . methylPREDNISolone sodium succinate      . metoprolol  2.5 mg Intravenous Once  . nitroGLYCERIN  0.4 mg Sublingual Once  . pantoprazole (PROTONIX) IV  40 mg Intravenous Q24H  . pneumococcal 23 valent vaccine  0.5 mL Intramuscular Tomorrow-1000  . potassium citrate  10 mEq Oral Q0600  . sodium chloride      . sodium chloride      . DISCONTD: albuterol  2.5 mg Nebulization Q4H  . DISCONTD: levalbuterol  0.63 mg Nebulization Q6H  . DISCONTD: levofloxacin  500 mg Oral Daily   Continuous:    . sodium chloride 100 mL/hr at 09/24/11 0345  . sodium chloride 125 mL/hr at 09/24/11 2339     Assessment/Plan: Principal Problem:  *Acute exacerbation of COPD with asthma -Patient seems to be slightly better than yesterday.  I will continue her intravenous on Solu-Medrol and  Levaquin as well. We will continue to monitor her and follow her clinical course. -Continue incentive spirometry and flutter valve. -We will transfer out to a regular bed. -She did have transient chest pain and tachycardia falling one hour of continuous albuterol nebulization, however her cardiac enzymes are negative. Her heart rate has come down significantly to the low 100s. She is chest pain-free now. -Since she has  some clinical improvement I will continue to hold off on pursuing further imaging (in terms of CAT scans).  Active Problems:  COPD (chronic obstructive pulmonary disease) -This is a chronic issue for the patient, unfortunately she continues to smoke. Rest as above.   Acute bronchitis -As above, she continues to have significant cough and as a result I will place her on guaifenesin with codeine. Patient claims she's had codeine in the past without any significant events.  Leukocytosis -This is secondary to intravenous steroids, she however is maintained on IV Levaquin.  Anxiety -We'll continue on as needed Xanax.  Obesity: -She has been counseled regarding the need to lose weight.   Tobacco abuse: -She has been counseled extensively regarding the importance of stopping tobacco use.  Disposition: -Remain inpatient, but transfer to a regular bed.   Maretta Bees, MD. 09/25/2011, 8:52 AM

## 2011-09-25 NOTE — Progress Notes (Signed)
MD notified of loss of IV access. MD ordered PICC line to be inserted. PICC nurse aware of order.

## 2011-09-25 NOTE — Progress Notes (Signed)
CSW received referral regarding lack of insurance and pt's difficulty getting medications.  Referral given to CM.  CSW will sign off unless further needs arise.  [name]

## 2011-09-26 ENCOUNTER — Inpatient Hospital Stay (HOSPITAL_COMMUNITY): Payer: Self-pay

## 2011-09-26 LAB — GLUCOSE, CAPILLARY
Glucose-Capillary: 136 mg/dL — ABNORMAL HIGH (ref 70–99)
Glucose-Capillary: 118 mg/dL — ABNORMAL HIGH (ref 70–99)
Glucose-Capillary: 168 mg/dL — ABNORMAL HIGH (ref 70–99)

## 2011-09-26 LAB — CBC
Hemoglobin: 12.7 g/dL (ref 12.0–15.0)
MCHC: 32.7 g/dL (ref 30.0–36.0)
RBC: 4.21 MIL/uL (ref 3.87–5.11)

## 2011-09-26 LAB — BASIC METABOLIC PANEL
CO2: 23 mEq/L (ref 19–32)
GFR calc non Af Amer: 83 mL/min — ABNORMAL LOW (ref >90)
Glucose, Bld: 115 mg/dL — ABNORMAL HIGH (ref 70–99)
Potassium: 4 mEq/L (ref 3.5–5.1)
Sodium: 136 mEq/L (ref 135–145)

## 2011-09-26 MED ORDER — LORAZEPAM 2 MG/ML IJ SOLN
1.0000 mg | Freq: Once | INTRAMUSCULAR | Status: AC
  Administered 2011-09-26: 1 mg via INTRAMUSCULAR
  Filled 2011-09-26: qty 1

## 2011-09-26 MED ORDER — LIDOCAINE HCL (PF) 1 % IJ SOLN: 5.0000 mL | Freq: Once | INTRAMUSCULAR | Status: DC

## 2011-09-26 MED ORDER — METHYLPREDNISOLONE SODIUM SUCC 125 MG IJ SOLR
80.0000 mg | Freq: Once | INTRAMUSCULAR | Status: AC
  Administered 2011-09-26: 81.25 mg via INTRAMUSCULAR
  Filled 2011-09-26: qty 2

## 2011-09-26 MED ORDER — LIDOCAINE HCL (PF) 1 % IJ SOLN
INTRAMUSCULAR | Status: AC
  Filled 2011-09-26: qty 5

## 2011-09-26 MED ORDER — METHYLPREDNISOLONE SODIUM SUCC 125 MG IJ SOLR
80.0000 mg | Freq: Three times a day (TID) | INTRAMUSCULAR | Status: DC
  Administered 2011-09-26 – 2011-09-27 (×2): 81.25 mg via INTRAMUSCULAR
  Filled 2011-09-26 (×3): qty 2

## 2011-09-26 MED ORDER — LORAZEPAM 2 MG/ML IJ SOLN
2.0000 mg | Freq: Once | INTRAMUSCULAR | Status: AC
  Administered 2011-09-26: 2 mg via INTRAMUSCULAR
  Filled 2011-09-26: qty 1

## 2011-09-26 NOTE — Procedures (Signed)
Surgical procedure note.    Attempted placement of left subclavian vein triple-lumen catheter. Risks benefits and alternatives of placement of a central line were discussed at length the patient. She did agree and consent. A left subclavian vein approach was attempted. 1% lidocaine was utilized for local and sterile Seldinger technique was attempted. Unfortunately due to the patient body habitus identification of the left subclavian vein was difficult.  In attempts to re angled the approach, patient did not tolerate due to pain and discomfort. Therefore additional attempts were ceased. Stat portable chest x-ray is ordered for evaluation of possible complications and attempted line placement. Continue oral and intramuscular treatment for now. I have discussed with the hospitalist possible interventional radiology or vascular surgery consultation for access should she continue to require IV treatment.

## 2011-09-26 NOTE — Progress Notes (Addendum)
Subjective: Patient is still short of breath, essentially unchanged from yesterday. Still complaining of a lot of cough. But not in acute distress and not using any accessory muscles of respiration.  Objective: Vital signs in last 24 hours: Temp:  [97.4 F (36.3 C)-98.3 F (36.8 C)] 97.4 F (36.3 C) (10/27 0757) Pulse Rate:  [95-119] 102  (10/27 0858) Resp:  [17-23] 20  (10/27 0858) BP: (107-127)/(43-85) 107/74 mmHg (10/27 0400) SpO2:  [91 %-98 %] 94 % (10/27 0858) FiO2 (%):  [0.2 %] 0.2 % (10/26 1934) Weight change:  Body mass index is 44.37 kg/(m^2).  Intake/Output from previous day: 10/26 0701 - 10/27 0700 In: 2339.6 [P.O.:1200; I.V.:989.6; IV Piggyback:150] Out: 1950 [Urine:1950]   PHYSICAL EXAM: Gen Exam: Awake and alert with clear speech. She is dyspneic but is just about able to complete full sentences. She's not using any accessory muscles of respiration. Neck: Supple, No JVD.   Chest: B/L air entry at present, however patient has expiratory rhonchi in all lung zones, unchanged compared to yesterday. CVS: S1 S2 Regular, no murmurs.  Abdomen: soft, BS +, non tender, non distended.  Extremities: no edema, warm.   Neurologic: Non Focal.   Skin: No Rash.   Wounds: N/A.    Lab Results:  Basename 09/26/11 0423 09/25/11 0749  WBC 17.1* 18.6*  HGB 12.7 12.8  HCT 38.8 38.9  PLT 307 271   CMET CMP     Component Value Date/Time   NA 136 09/26/2011 0423   K 4.0 09/26/2011 0423   CL 104 09/26/2011 0423   CO2 23 09/26/2011 0423   GLUCOSE 115* 09/26/2011 0423   BUN 16 09/26/2011 0423   CREATININE 0.88 09/26/2011 0423   CALCIUM 8.9 09/26/2011 0423   PROT 7.5 09/24/2011 0437   ALBUMIN 3.3* 09/24/2011 0437   AST 12 09/24/2011 0437   ALT 17 09/24/2011 0437   ALKPHOS 89 09/24/2011 0437   BILITOT 0.1* 09/24/2011 0437   GFRNONAA 83* 09/26/2011 0423   GFRAA >90 09/26/2011 0423     No results found for this basename: PT:2,INR:2 in the last 72  hours  Studies/Results: Dg Chest Portable 1 View  09/26/2011  *RADIOLOGY REPORT*  Clinical Data: Asthma  PORTABLE CHEST - 1 VIEW  Comparison:   the previous day's study  Findings: Lungs clear.  Heart size and pulmonary vascularity normal.  No effusion.  Visualized bones unremarkable.  IMPRESSION: No acute disease  Original Report Authenticated By: Osa Craver, M.D.   Dg Chest Portable 1 View  09/25/2011  *RADIOLOGY REPORT*  Clinical Data: Short of breath  PORTABLE CHEST - 1 VIEW  Comparison: 09/23/2011  Findings: Normal heart size.  Clear lungs.  No pneumothorax.  No pleural effusion.  IMPRESSION: No active cardiopulmonary disease.  Original Report Authenticated By: Donavan Burnet, M.D.    Medications:  Scheduled:    . aspirin EC  325 mg Oral Daily  . benzonatate  100 mg Oral TID  . docusate sodium  100 mg Oral BID  . enoxaparin  40 mg Subcutaneous Q24H  . fluconazole  150 mg Oral Once  . influenza  inactive virus vaccine  0.5 mL Intramuscular Once  . insulin aspart  0-15 Units Subcutaneous TID WC  . ipratropium  0.5 mg Nebulization Q4H  . levalbuterol  0.63 mg Nebulization Q4H  . levofloxacin  750 mg Oral QHS  . pantoprazole  40 mg Oral QAC breakfast  . pneumococcal 23 valent vaccine  0.5 mL Intramuscular Tomorrow-1000  .  potassium citrate  10 mEq Oral Q0600  . DISCONTD: levofloxacin (LEVAQUIN) IV  750 mg Intravenous Q24H  . DISCONTD: methylPREDNISolone sodium succinate  81.25 mg Intravenous Q6H  . DISCONTD: methylPREDNISolone (SOLU-MEDROL) injection  81.25 mg Intramuscular Q6H  . DISCONTD: pantoprazole (PROTONIX) IV  40 mg Intravenous Q24H   Continuous:    . DISCONTD: sodium chloride Stopped (09/25/11 1500)    Assessment/Plan: Principal Problem:  *Acute exacerbation of COPD with asthma -Patient seems to be unchanged compared to yesterday.  I will continue her intravenous on Solu-Medrol and  Levaquin as well. We will continue to monitor her and follow her  clinical course. -Continue incentive spirometry and flutter valve. -We will transfer out to a regular bed. -She did have transient chest pain and tachycardia falling one hour of continuous albuterol nebulization, however her cardiac enzymes are negative. Her heart rate has come down significantly to the low 100s. She is chest pain-free now. -Unfortunately patient does not have an IV access, we are currently waiting for her having a PICC line and possibly, she has received her last 2 doses of Solu-Medrol IV intramuscular route. Once we have a line placed we will resume her usual intravenous dosing.  Active Problems:  COPD (chronic obstructive pulmonary disease) -This is a chronic issue for the patient, unfortunately she continues to smoke. Rest as above.   Acute bronchitis -As above, she continues to have significant cough and as a result I will place her on guaifenesin with codeine. Patient claims she's had codeine in the past without any significant events.  Leukocytosis -This is secondary to intravenous steroids, she however is maintained on IV Levaquin.  Anxiety -We'll continue on as needed Xanax.  Obesity: -She has been counseled regarding the need to lose weight.   Tobacco abuse: -She has been counseled extensively regarding the importance of stopping tobacco use.  Disposition: -Remain inpatient, but transfer to a regular bed.   Maretta Bees, MD. 09/26/2011, 9:26 AM   Addendum-  I have spoken with Dr. Leticia Penna he will be evaluate the patient today for central venous access.

## 2011-09-26 NOTE — Progress Notes (Signed)
Failed attempt at central line placement by Dr. Leticia Penna. Pt is anxious and crying. Port CXR done. Pt reports left arm is numb and tingling. Pt denies pain.

## 2011-09-26 NOTE — Progress Notes (Signed)
Report given to C. Keattes. Pt transported to room 202. Pt alert, oriented and in stable condition at the time of transport.

## 2011-09-27 ENCOUNTER — Ambulatory Visit (HOSPITAL_COMMUNITY)
Admit: 2011-09-27 | Discharge: 2011-09-27 | Disposition: A | Discharge status: Home / Self Care | Payer: Self-pay | Source: Ambulatory Visit | Attending: Internal Medicine | Admitting: Internal Medicine

## 2011-09-27 DIAGNOSIS — J441 Chronic obstructive pulmonary disease with (acute) exacerbation: Secondary | ICD-10-CM | POA: Insufficient documentation

## 2011-09-27 DIAGNOSIS — I10 Essential (primary) hypertension: Secondary | ICD-10-CM | POA: Insufficient documentation

## 2011-09-27 DIAGNOSIS — Z87442 Personal history of urinary calculi: Secondary | ICD-10-CM | POA: Insufficient documentation

## 2011-09-27 LAB — BASIC METABOLIC PANEL
CO2: 25 mEq/L (ref 19–32)
Chloride: 104 mEq/L (ref 96–112)
Creatinine, Ser: 0.83 mg/dL (ref 0.50–1.10)
GFR calc Af Amer: >90 mL/min (ref >90)
Potassium: 4.2 mEq/L (ref 3.5–5.1)
Sodium: 137 mEq/L (ref 135–145)

## 2011-09-27 LAB — GLUCOSE, CAPILLARY: Glucose-Capillary: 131 mg/dL — ABNORMAL HIGH (ref 70–99)

## 2011-09-27 LAB — CBC
MCV: 90.7 fL (ref 78.0–100.0)
Platelets: 277 10*3/uL (ref 150–400)
RBC: 4.21 MIL/uL (ref 3.87–5.11)
WBC: 12.3 10*3/uL — ABNORMAL HIGH (ref 4.0–10.5)

## 2011-09-27 MED ORDER — ZOLPIDEM TARTRATE 5 MG PO TABS
5.0000 mg | ORAL_TABLET | Freq: Once | ORAL | Status: AC
  Administered 2011-09-27: 5 mg via ORAL
  Filled 2011-09-27: qty 1

## 2011-09-27 MED ORDER — METHYLPREDNISOLONE SODIUM SUCC 125 MG IJ SOLR
80.0000 mg | Freq: Three times a day (TID) | INTRAMUSCULAR | Status: DC
  Administered 2011-09-27 – 2011-09-29 (×6): 81.25 mg via INTRAVENOUS
  Filled 2011-09-27 (×6): qty 2

## 2011-09-27 MED ORDER — SODIUM CHLORIDE 0.9 % IJ SOLN
10.0000 mL | Freq: Two times a day (BID) | INTRAMUSCULAR | Status: DC
  Administered 2011-09-27: 10 mL via INTRAVENOUS
  Administered 2011-09-27: 20 mL via INTRAVENOUS
  Administered 2011-09-28 – 2011-09-29 (×5): 10 mL via INTRAVENOUS
  Filled 2011-09-27 (×2): qty 10
  Filled 2011-09-27: qty 20
  Filled 2011-09-27: qty 10
  Filled 2011-09-27: qty 20

## 2011-09-27 MED ORDER — NICOTINE 14 MG/24HR TD PT24
14.0000 mg | MEDICATED_PATCH | Freq: Every day | TRANSDERMAL | Status: DC
  Administered 2011-09-27 – 2011-09-30 (×4): 14 mg via TRANSDERMAL
  Filled 2011-09-27 (×4): qty 1

## 2011-09-27 MED ORDER — ZOLPIDEM TARTRATE 5 MG PO TABS
5.0000 mg | ORAL_TABLET | Freq: Every evening | ORAL | Status: DC | PRN
  Administered 2011-09-27: 5 mg via ORAL
  Filled 2011-09-27: qty 1

## 2011-09-27 NOTE — Progress Notes (Signed)
Subjective: Patient seen after returning from [place] for PICC insertion. Still c/o SOB and wheezing off and on.   Objective:  Vital signs in last 24 hours:  Filed Vitals:   09/26/11 2014 09/26/11 2100 09/27/11 0017 09/27/11 0600  BP:  114/71  125/74  Pulse:  86  83  Temp:  98.1 F (36.7 C)  98.1 F (36.7 C)  TempSrc:  Oral  Oral  Resp:  18  20  Height:      Weight:      SpO2: 92% 86% 95% 90%    Intake/Output from previous day:   Intake/Output Summary (Last 24 hours) at 09/27/11 1410 Last data filed at 09/27/11 0800  Gross per 24 hour  Intake    480 ml  Output    800 ml  Net   -320 ml    Physical Exam: General: middle aged obese female  in no acute distress.  HEENT: No pallor , no icterus, moist oral mucosa Heart: Regular rate and rhythm, without murmurs, rubs, gallops. Lungs: b/l diffuse wheezes Abdomen: Soft, nontender, nondistended, positive bowel sounds. Extremities: No clubbing cyanosis or edema with positive pedal pulses. Neuro: AAOX 3  nonfocal.   Lab Results:  Basic Metabolic Panel:    Component Value Date/Time   NA 137 09/27/2011 0509   K 4.2 09/27/2011 0509   CL 104 09/27/2011 0509   CO2 25 09/27/2011 0509   BUN 18 09/27/2011 0509   CREATININE 0.83 09/27/2011 0509   GLUCOSE 154* 09/27/2011 0509   CALCIUM 9.1 09/27/2011 0509   CBC:    Component Value Date/Time   WBC 12.3* 09/27/2011 0509   HGB 12.3 09/27/2011 0509   HCT 38.2 09/27/2011 0509   PLT 277 09/27/2011 0509   MCV 90.7 09/27/2011 0509   NEUTROABS 7.5 09/23/2011 2204   LYMPHSABS 2.1 09/23/2011 2204   MONOABS 0.5 09/23/2011 2204   EOSABS 0.2 09/23/2011 2204   BASOSABS 0.0 09/23/2011 2204    Recent Results (from the past 240 hour(s))  MRSA PCR SCREENING     Status: Normal   Collection Time   09/24/11  5:54 PM      Component Value Range Status Comment   MRSA by PCR NEGATIVE  NEGATIVE  Final     Studies/Results: Ir Fluoro Guide Cv Line Right  09/27/2011  * RADIOLOGY  REPORT *  Clinical data: COPD exacerbation, needs IV access  PICC PLACEMENT WITH ULTRASOUND AND FLUOROSCOPY  Technique: After written informed consent was obtained, patient was placed in the supine position on angiographic table. Patency of the right basilic vein was confirmed with ultrasound with image documentation. An appropriate skin site was determined. Skin site was marked. Region was prepped using maximum barrier technique including cap and mask, sterile gown, sterile gloves, large sterile sheet, and Chlorhexidine   as cutaneous antisepsis.  The region was infiltrated locally with 1% lidocaine.   Under real-time ultrasound guidance, the right basilic vein was accessed with a 21 gauge micropuncture needle; the needle tip within the vein was confirmed with ultrasound image documentation.   Needle exchanged over a 018 guidewire for a peel-away sheath, through which a 5-French double- lumen power injectable PICC trimmed to 46cm was advanced, positioned with its tip near the cavoatrial junction.  Spot chest radiograph confirms appropriate catheter position.  Catheter was flushed per protocol and secured externally with 0-Prolene sutures. The patient tolerated procedure well, with no immediate complication.  IMPRESSION: Technically successful five [place] double lumen power injectable PICC placement  Original Report Authenticated By: Osa Craver, M.D.   Ir US Guide Vasc Access Right  09/27/2011  * RADIOLOGY REPORT *  Clinical data: COPD exacerbation, needs IV access  PICC PLACEMENT WITH ULTRASOUND AND FLUOROSCOPY  Technique: After written informed consent was obtained, patient was placed in the supine position on angiographic table. Patency of the right basilic vein was confirmed with ultrasound with image documentation. An appropriate skin site was determined. Skin site was marked. Region was prepped using maximum barrier technique including cap and mask, sterile gown, sterile gloves, large sterile  sheet, and Chlorhexidine   as cutaneous antisepsis.  The region was infiltrated locally with 1% lidocaine.   Under real-time ultrasound guidance, the right basilic vein was accessed with a 21 gauge micropuncture needle; the needle tip within the vein was confirmed with ultrasound image documentation.   Needle exchanged over a 018 guidewire for a peel-away sheath, through which a 5-French double- lumen power injectable PICC trimmed to 46cm was advanced, positioned with its tip near the cavoatrial junction.  Spot chest radiograph confirms appropriate catheter position.  Catheter was flushed per protocol and secured externally with 0-Prolene sutures. The patient tolerated procedure well, with no immediate complication.  IMPRESSION: Technically successful five [place] double lumen power injectable PICC placement  Original Report Authenticated By: Osa Craver, M.D.   Dg Chest Portable 1 View  09/26/2011  *RADIOLOGY REPORT*  Clinical Data: Central venous catheter attempted  PORTABLE CHEST - 1 VIEW  Comparison: Earlier film of the same day  Findings: No pneumothorax. Lungs clear.  Heart size and pulmonary vascularity normal.  No effusion.  Visualized bones unremarkable.  IMPRESSION: No acute disease  Original Report Authenticated By: Osa Craver, M.D.   Dg Chest Portable 1 View  09/26/2011  *RADIOLOGY REPORT*  Clinical Data: Asthma  PORTABLE CHEST - 1 VIEW  Comparison:   the previous day's study  Findings: Lungs clear.  Heart size and pulmonary vascularity normal.  No effusion.  Visualized bones unremarkable.  IMPRESSION: No acute disease  Original Report Authenticated By: Osa Craver, M.D.    Medications: Scheduled Meds:   . aspirin EC  325 mg Oral Daily  . benzonatate  100 mg Oral TID  . docusate sodium  100 mg Oral BID  . enoxaparin  40 mg Subcutaneous Q24H  . influenza  inactive virus vaccine  0.5 mL Intramuscular Once  . insulin aspart  0-15 Units Subcutaneous TID WC  .  ipratropium  0.5 mg Nebulization Q4H  . levalbuterol  0.63 mg Nebulization Q4H  . levofloxacin  750 mg Oral QHS  . lidocaine  5 mL Intradermal Once  . lidocaine      . LORazepam  1 mg Intramuscular Once  . methylPREDNISolone (SOLU-MEDROL) injection  81.25 mg Intravenous TID  . nicotine  14 mg Transdermal Daily  . pantoprazole  40 mg Oral QAC breakfast  . potassium citrate  10 mEq Oral Q0600  . sodium chloride  10 mL Intravenous Q12H  . zolpidem  5 mg Oral Once  . DISCONTD: methylPREDNISolone (SOLU-MEDROL) injection  81.25 mg Intramuscular TID   Continuous Infusions:  PRN Meds:.acetaminophen, acetaminophen, albuterol, ALPRAZolam, guaiFENesin-codeine, guaiFENesin-dextromethorphan, nitroGLYCERIN, ondansetron (ZOFRAN) IV, ondansetron, oxyCODONE, promethazine, promethazine, senna, zolpidem, DISCONTD: zolpidem  Assessment/Plan:   35 Y/O female with hx fo asthma/ COPD , active smoker, hx of anxiety presenting with acute exacerbation of COPD with asthma with slow recovery   PLAN:  *Acute exacerbation of COPD with asthma  Improving very slowly  was sent to [facility] for PICC placement due to poor IV access Will resume IV solumedrol 80 mg q8hrs Cont xopenex and atrovent nebs q4hr and prn Cont PPI for GI prophylaxis  patient is active smoker and smokes 1/2 PPD ,counseled on smoking cessation and  will order nicotine patch denes any chest paina t this time, but becomes tachycardic easily with anxiety   Acute bronchitis Continues to have cough and on guaifenesin  cont levofloxacin po   Anxiety  cont xanax   Leucocytosis  sec to steroid use  stable   DVT prophylaxis: sq lovenox  Disposition  home once clinically improved      LOS: 4 days   Daniyla Pfahler 09/27/2011, 2:10 PM

## 2011-09-27 NOTE — Progress Notes (Signed)
Pt has mostly upper airway wheezes, (throat) -- also with every cough pts HR will increase 20 beats then slow down back to normal

## 2011-09-28 ENCOUNTER — Other Ambulatory Visit (HOSPITAL_COMMUNITY): Payer: Self-pay

## 2011-09-28 LAB — GLUCOSE, CAPILLARY
Glucose-Capillary: 180 mg/dL — ABNORMAL HIGH (ref 70–99)
Glucose-Capillary: 136 mg/dL — ABNORMAL HIGH (ref 70–99)
Glucose-Capillary: 149 mg/dL — ABNORMAL HIGH (ref 70–99)
Glucose-Capillary: 144 mg/dL — ABNORMAL HIGH (ref 70–99)

## 2011-09-28 MED ORDER — FLUOXETINE HCL 10 MG PO CAPS
10.0000 mg | ORAL_CAPSULE | Freq: Every day | ORAL | Status: DC
  Administered 2011-09-28 – 2011-09-30 (×3): 10 mg via ORAL
  Filled 2011-09-28 (×6): qty 1

## 2011-09-28 MED ORDER — LORAZEPAM 0.5 MG PO TABS
0.5000 mg | ORAL_TABLET | Freq: Four times a day (QID) | ORAL | Status: DC | PRN
  Administered 2011-09-28 – 2011-09-30 (×4): 0.5 mg via ORAL
  Filled 2011-09-28 (×4): qty 1

## 2011-09-28 MED ORDER — ZOLPIDEM TARTRATE 5 MG PO TABS
5.0000 mg | ORAL_TABLET | Freq: Once | ORAL | Status: DC
  Filled 2011-09-28: qty 1

## 2011-09-28 MED ORDER — ENOXAPARIN SODIUM 80 MG/0.8ML ~~LOC~~ SOLN
0.5000 mg/kg | Freq: Every day | SUBCUTANEOUS | Status: DC
  Administered 2011-09-28 – 2011-09-30 (×3): 60 mg via SUBCUTANEOUS
  Filled 2011-09-28 (×3): qty 0.8

## 2011-09-28 MED ORDER — ZOLPIDEM TARTRATE 5 MG PO TABS: 10.0000 mg | ORAL_TABLET | Freq: Every evening | ORAL | Status: DC | PRN

## 2011-09-28 NOTE — Progress Notes (Signed)
Patient c/o being depressed for a while.  Stated she was supposed to go to [employer]. Recently but was not able to get there.  She was tearful and convincing of the depression she is experiencing.

## 2011-09-28 NOTE — Significant Event (Signed)
At 2142 patient had a 9 beat run of v-tach.  Dr. Lovell Sheehan notified.  Patient did feel heart race.  No other symptom.  Vitals stable at that time 123/77, 97, 97.8, 20, oxygen saturation 94% on 2LNC.Marland Kitchen  Patient has c/o Ribcage pain from coughing.  I.S. Volumes of 1000.  Will monitor for now.

## 2011-09-28 NOTE — Progress Notes (Signed)
Subjective: still feels SOB and wheezy. Feels depressed too due to personal issues. denies suicidal ideations.  Objective:  Vital signs in last 24 hours:  Filed Vitals:   09/27/11 2110 09/28/11 0339 09/28/11 0604 09/28/11 0752  BP: 123/77  124/76   Pulse: 97  89   Temp: 97.8 F (36.6 C)  97.8 F (36.6 C)   TempSrc: Oral  Oral   Resp: 20  20   Height:      Weight:      SpO2: 94% 95% 92% 96%    Intake/Output from previous day:   Intake/Output Summary (Last 24 hours) at 09/28/11 1028 Last data filed at 09/28/11 0500  Gross per 24 hour  Intake   1010 ml  Output    800 ml  Net    210 ml    Physical Exam:  General:  Middle aged obese female  in no acute distress. HEENT: no pallor, no icterus, moist oral mucosa, no JVD, no lymphadenopathy Heart: Normal  s1 &s2  Regular rate and rhythm, gets tachy cardic with minimal exertion, no murmurs, rubs, gallops. Lungs: diffuse b/l wheeze and ronchi Abdomen: Soft, nontender, nondistended, positive bowel sounds. Extremities: No clubbing cyanosis or edema with positive pedal pulses. Neuro: Alert, awake, oriented x3, nonfocal.   Lab Results:  Basic Metabolic Panel:    Component Value Date/Time   NA 137 09/27/2011 0509   K 4.2 09/27/2011 0509   CL 104 09/27/2011 0509   CO2 25 09/27/2011 0509   BUN 18 09/27/2011 0509   CREATININE 0.83 09/27/2011 0509   GLUCOSE 154* 09/27/2011 0509   CALCIUM 9.1 09/27/2011 0509   CBC:    Component Value Date/Time   WBC 12.3* 09/27/2011 0509   HGB 12.3 09/27/2011 0509   HCT 38.2 09/27/2011 0509   PLT 277 09/27/2011 0509   MCV 90.7 09/27/2011 0509   NEUTROABS 7.5 09/23/2011 2204   LYMPHSABS 2.1 09/23/2011 2204   MONOABS 0.5 09/23/2011 2204   EOSABS 0.2 09/23/2011 2204   BASOSABS 0.0 09/23/2011 2204    Recent Results (from the past 240 hour(s))  MRSA PCR SCREENING     Status: Normal   Collection Time   09/24/11  5:54 PM      Component Value Range Status Comment   MRSA by PCR  NEGATIVE  NEGATIVE  Final     Studies/Results: Ir Fluoro Guide Cv Line Right  09/27/2011  * RADIOLOGY REPORT *  Clinical data: COPD exacerbation, needs IV access  PICC PLACEMENT WITH ULTRASOUND AND FLUOROSCOPY  Technique: After written informed consent was obtained, patient was placed in the supine position on angiographic table. Patency of the right basilic vein was confirmed with ultrasound with image documentation. An appropriate skin site was determined. Skin site was marked. Region was prepped using maximum barrier technique including cap and mask, sterile gown, sterile gloves, large sterile sheet, and Chlorhexidine   as cutaneous antisepsis.  The region was infiltrated locally with 1% lidocaine.   Under real-time ultrasound guidance, the right basilic vein was accessed with a 21 gauge micropuncture needle; the needle tip within the vein was confirmed with ultrasound image documentation.   Needle exchanged over a 018 guidewire for a peel-away sheath, through which a 5-French double- lumen power injectable PICC trimmed to 46cm was advanced, positioned with its tip near the cavoatrial junction.  Spot chest radiograph confirms appropriate catheter position.  Catheter was flushed per protocol and secured externally with 0-Prolene sutures. The patient tolerated procedure well, with no immediate  complication.  IMPRESSION: Technically successful five [place] double lumen power injectable PICC placement  Original Report Authenticated By: Osa Craver, M.D.   Ir US Guide Vasc Access Right  09/27/2011  * RADIOLOGY REPORT *  Clinical data: COPD exacerbation, needs IV access  PICC PLACEMENT WITH ULTRASOUND AND FLUOROSCOPY  Technique: After written informed consent was obtained, patient was placed in the supine position on angiographic table. Patency of the right basilic vein was confirmed with ultrasound with image documentation. An appropriate skin site was determined. Skin site was marked. Region was  prepped using maximum barrier technique including cap and mask, sterile gown, sterile gloves, large sterile sheet, and Chlorhexidine   as cutaneous antisepsis.  The region was infiltrated locally with 1% lidocaine.   Under real-time ultrasound guidance, the right basilic vein was accessed with a 21 gauge micropuncture needle; the needle tip within the vein was confirmed with ultrasound image documentation.   Needle exchanged over a 018 guidewire for a peel-away sheath, through which a 5-French double- lumen power injectable PICC trimmed to 46cm was advanced, positioned with its tip near the cavoatrial junction.  Spot chest radiograph confirms appropriate catheter position.  Catheter was flushed per protocol and secured externally with 0-Prolene sutures. The patient tolerated procedure well, with no immediate complication.  IMPRESSION: Technically successful five [place] double lumen power injectable PICC placement  Original Report Authenticated By: Osa Craver, M.D.   Dg Chest Portable 1 View  09/26/2011  *RADIOLOGY REPORT*  Clinical Data: Central venous catheter attempted  PORTABLE CHEST - 1 VIEW  Comparison: Earlier film of the same day  Findings: No pneumothorax. Lungs clear.  Heart size and pulmonary vascularity normal.  No effusion.  Visualized bones unremarkable.  IMPRESSION: No acute disease  Original Report Authenticated By: Osa Craver, M.D.    Medications: Scheduled Meds:   . aspirin EC  325 mg Oral Daily  . benzonatate  100 mg Oral TID  . docusate sodium  100 mg Oral BID  . enoxaparin (LOVENOX) injection  0.5 mg/kg Subcutaneous Daily  . influenza  inactive virus vaccine  0.5 mL Intramuscular Once  . insulin aspart  0-15 Units Subcutaneous TID WC  . ipratropium  0.5 mg Nebulization Q4H  . levalbuterol  0.63 mg Nebulization Q4H  . levofloxacin  750 mg Oral QHS  . lidocaine  5 mL Intradermal Once  . methylPREDNISolone (SOLU-MEDROL) injection  81.25 mg Intravenous TID    . nicotine  14 mg Transdermal Daily  . pantoprazole  40 mg Oral QAC breakfast  . potassium citrate  10 mEq Oral Q0600  . sodium chloride  10 mL Intravenous Q12H  . zolpidem  5 mg Oral Once  . DISCONTD: enoxaparin  40 mg Subcutaneous Q24H  . DISCONTD: methylPREDNISolone (SOLU-MEDROL) injection  81.25 mg Intramuscular TID   Continuous Infusions:  PRN Meds:.acetaminophen, acetaminophen, albuterol, ALPRAZolam, guaiFENesin-codeine, guaiFENesin-dextromethorphan, nitroGLYCERIN, ondansetron (ZOFRAN) IV, ondansetron, oxyCODONE, promethazine, promethazine, senna, zolpidem, DISCONTD: zolpidem, DISCONTD: zolpidem  Assessment/Plan:  35 Y/O female with hx fo asthma/ COPD , active smoker, hx of anxiety presenting with acute exacerbation of COPD with asthma with slow recovery .  PLAN:  *Acute exacerbation of COPD with asthma  Improving very slowly . Still short of bewath and wheezy was sent to  for PICC placement on 10/28 due to poor IV access  -resumed IV solumedrol 80 mg q8hrs  Cont xopenex and atrovent nebs q4hr and prn  Cont PPI for GI prophylaxis  patient is active  smoker and smokes 1/2 PPD ,counseled on smoking cessation and ordered  nicotine patch  denies any chest pain at  this time, but becomes tachycardic easily with anxiety   Acute bronchitis  Continues to have cough and on guaifenesin  Completed course of  levofloxacin po   Anxiety / depression Switch to prn ativan Informs feeling depressed due to personal issues. Denies being suicidal. Will start on low dose fluoxetine  Leucocytosis  sec to steroid use  stable   DVT prophylaxis: sq lovenox   Disposition  home once clinically improved       LOS: 5 days   Quyen Cutsforth 09/28/2011, 10:28 AM

## 2011-09-29 LAB — GLUCOSE, CAPILLARY: Glucose-Capillary: 138 mg/dL — ABNORMAL HIGH (ref 70–99)

## 2011-09-29 MED ORDER — PREDNISONE 20 MG PO TABS
40.0000 mg | ORAL_TABLET | Freq: Every day | ORAL | Status: DC
  Administered 2011-09-29 – 2011-09-30 (×2): 40 mg via ORAL
  Filled 2011-09-29 (×2): qty 2

## 2011-09-29 NOTE — Discharge Summary (Signed)
Patient ID: [name] MRN: [id number] DOB/AGE: 37-Oct-1977 37 y.o.  Admit date: 09/23/2011 Discharge date: 09/30/2011  Primary Care Physician:  No primary provider on file.  Discharge Diagnoses:    Present on Admission:  .Acute exacerbation of COPD with asthma .Acute bronchitis .Anxiety     Current Discharge Medication List    CONTINUE these medications which have NOT CHANGED   Details  albuterol (PROVENTIL HFA;VENTOLIN HFA) 108 (90 BASE) MCG/ACT inhaler Inhale 2 puffs into the lungs every 6 (six) hours as needed. For asthma     ibuprofen (ADVIL,MOTRIN) 200 MG tablet Take 400 mg by mouth every 6 (six) hours as needed. For pain    Potassium Citrate (UROCIT-K 15 PO) Take 1 capsule by mouth daily.      albuterol (PROVENTIL) (2.5 MG/3ML) 0.083% nebulizer solution Take 2.5 mg by nebulization every 6 (six) hours as needed. For asthma     amoxicillin-clavulanate (AUGMENTIN) 875-125 MG per tablet Take 1 tablet by mouth 2 (two) times daily.      oxyCODONE-acetaminophen (PERCOCET) 10-325 MG per tablet Take 1 tablet by mouth every 4 (four) hours as needed. Pain         Disposition and Follow-up:  Patient being arranged [facility] for her medications and outpt issues until she gets PCP    Significant Diagnostic Studies:  Ir Fluoro Guide Cv Line Right  09/27/2011  * RADIOLOGY REPORT *  Clinical data: COPD exacerbation, needs IV access  PICC PLACEMENT WITH ULTRASOUND AND FLUOROSCOPY  Technique: After written informed consent was obtained, patient was placed in the supine position on angiographic table. Patency of the right basilic vein was confirmed with ultrasound with image documentation. An appropriate skin site was determined. Skin site was marked. Region was prepped using maximum barrier technique including cap and mask, sterile gown, sterile gloves, large sterile sheet, and Chlorhexidine   as cutaneous antisepsis.  The region was infiltrated  locally with 1% lidocaine.   Under real-time ultrasound guidance, the right basilic vein was accessed with a 21 gauge micropuncture needle; the needle tip within the vein was confirmed with ultrasound image documentation.   Needle exchanged over a 018 guidewire for a peel-away sheath, through which a 5-French double- lumen power injectable PICC trimmed to 46cm was advanced, positioned with its tip near the cavoatrial junction.  Spot chest radiograph confirms appropriate catheter position.  Catheter was flushed per protocol and secured externally with 0-Prolene sutures. The patient tolerated procedure well, with no immediate complication.  IMPRESSION: Technically successful five [place] double lumen power injectable PICC placement  Original Report Authenticated By: Osa Craver, M.D.   Ir US Guide Vasc Access Right  09/27/2011  * RADIOLOGY REPORT *  Clinical data: COPD exacerbation, needs IV access  PICC PLACEMENT WITH ULTRASOUND AND FLUOROSCOPY  Technique: After written informed consent was obtained, patient was placed in the supine position on angiographic table. Patency of the right basilic vein was confirmed with ultrasound with image documentation. An appropriate skin site was determined. Skin site was marked. Region was prepped using maximum barrier technique including cap and mask, sterile gown, sterile gloves, large sterile sheet, and Chlorhexidine   as cutaneous antisepsis.  The region was infiltrated locally with 1% lidocaine.   Under real-time ultrasound guidance, the right basilic vein was accessed with a 21 gauge micropuncture needle; the needle tip within the vein was confirmed with ultrasound image documentation.   Needle exchanged over a 018 guidewire for a peel-away sheath,  through which a 5-French double- lumen power injectable PICC trimmed to 46cm was advanced, positioned with its tip near the cavoatrial junction.  Spot chest radiograph confirms appropriate catheter position.  Catheter  was flushed per protocol and secured externally with 0-Prolene sutures. The patient tolerated procedure well, with no immediate complication.  IMPRESSION: Technically successful five [place] double lumen power injectable PICC placement  Original Report Authenticated By: Osa Craver, M.D.    Brief H and P: For complete details please refer to admission H and P, but in brief Ms Vinson [name] is a 35 year old, Caucasian female, who is obese, with history of nephrolithiasis, and COPD, who, unfortunately, continues to smoke, who presents with two-day history of a dry cough. Wheezing started this morning. She's been very short of breath. She's had sweating episodes. However, denies any fever. Denies any sick contacts. She gets chest pain, when she coughs. She denies any nausea, vomiting. Denies any leg swelling. She said she hasn't smoked in the last 2 weeks, but before that she was smoking about half pack every day. She tried taking albuterol inhaler along with the Robitussin and TheraFlu. However, she did not get any relief and so decided to come into the ED.      Physical Exam on Discharge:  Filed Vitals:   09/29/11 0349 09/29/11 0517 09/29/11 0711 09/29/11 1112  BP:  136/83    Pulse:  90    Temp:  97.9 F (36.6 C)    TempSrc:  Tympanic    Resp:  20    Height:      Weight:      SpO2: 96% 95% 96% 96%   General: Middle aged obese female in no acute distress.  HEENT: no pallor, no icterus, moist oral mucosa, no JVD, no lymphadenopathy  Heart: Normal s1 &s2 Regular rate and rhythm, gets tachy cardic with minimal exertion, no murmurs, rubs, gallops.  Lungs:  b/l scattered  wheeze and ronchi , improved Abdomen: Soft, nontender, nondistended, positive bowel sounds.  Extremities: No clubbing cyanosis or edema with positive pedal pulses.  Neuro: Alert, awake, oriented x3, nonfocal.   Intake/Output Summary (Last 24 hours) at 09/29/11 1127 Last data filed at 09/29/11 0900  Gross per 24 hour    Intake   1080 ml  Output    600 ml  Net    480 ml      CBC:    Component Value Date/Time   WBC 12.3* 09/27/2011 0509   HGB 12.3 09/27/2011 0509   HCT 38.2 09/27/2011 0509   PLT 277 09/27/2011 0509   MCV 90.7 09/27/2011 0509   NEUTROABS 7.5 09/23/2011 2204   LYMPHSABS 2.1 09/23/2011 2204   MONOABS 0.5 09/23/2011 2204   EOSABS 0.2 09/23/2011 2204   BASOSABS 0.0 09/23/2011 2204    Basic Metabolic Panel:    Component Value Date/Time   NA 137 09/27/2011 0509   K 4.2 09/27/2011 0509   CL 104 09/27/2011 0509   CO2 25 09/27/2011 0509   BUN 18 09/27/2011 0509   CREATININE 0.83 09/27/2011 0509   GLUCOSE 154* 09/27/2011 0509   CALCIUM 9.1 09/27/2011 0509    Hospital Course:  *Acute exacerbation of COPD with asthma  Improving very slowly . Her wheezing and SOB now imrpoving  was sent to [facility] for PICC placement on 10/28 due to poor IV access  -required IV solumedrol for several days Continued on xopenex and atrovent nebs q4hr and prn  -PPI for GI prophylaxis while on steroids patient  is active smoker and smokes 1/2 PPD ,counseled on smoking cessation and ordered nicotine patch while in hospital Patient now switched to po prednisone and can be discharged home on prednisone taper once stable  Acute bronchitis  Continues to have cough and on guaifenesin  Completed course of levofloxacin po   Anxiety / depression  Switch to prn ativan  Informs feeling depressed due to personal issues. Denies being suicidal.  Will started  On low dose fluoxetine  With improvement in symptoms  Leucocytosis  sec to steroid use  stable    Time spent on Discharge: 40 Minutes  Signed: Kamauri Kathol 09/29/2011, 11:27 AM

## 2011-09-30 MED ORDER — OXYCODONE HCL 5 MG PO TABS: 5.0000 mg | ORAL_TABLET | ORAL | Status: AC | PRN

## 2011-09-30 MED ORDER — LORAZEPAM 0.5 MG PO TABS: 0.5000 mg | ORAL_TABLET | Freq: Four times a day (QID) | ORAL | Status: AC | PRN

## 2011-09-30 MED ORDER — PREDNISONE 20 MG PO TABS: ORAL_TABLET | ORAL | Status: DC

## 2011-09-30 MED ORDER — LEVOFLOXACIN 750 MG PO TABS: 750.0000 mg | ORAL_TABLET | Freq: Every day | ORAL | Status: AC

## 2011-09-30 MED ORDER — FLUOXETINE HCL 10 MG PO CAPS: 10.0000 mg | ORAL_CAPSULE | Freq: Every day | ORAL | Status: DC

## 2011-09-30 MED ORDER — BENZONATATE 100 MG PO CAPS: 100.0000 mg | ORAL_CAPSULE | Freq: Three times a day (TID) | ORAL | Status: AC

## 2011-09-30 NOTE — Discharge Summary (Signed)
Physician Discharge Summary  Patient ID: [name] MRN: [id number] DOB/AGE: 37-Mar-1977 37 y.o.  Admit date: 09/23/2011 Discharge date: 09/30/2011    Discharge Diagnoses:  1. Acute exacerbation of asthma with possible underlying COPD. 2. Anxiety and depression. 3. Obesity. BMI 44.   Current Discharge Medication List    START taking these medications   Details  benzonatate (TESSALON) 100 MG capsule Take 1 capsule (100 mg total) by mouth 3 (three) times daily. Qty: 20 capsule, Refills: 0    FLUoxetine (PROZAC) 10 MG capsule Take 1 capsule (10 mg total) by mouth daily. Qty: 30 capsule, Refills: 0    levofloxacin (LEVAQUIN) 750 MG tablet Take 1 tablet (750 mg total) by mouth at bedtime. Qty: 3 tablet, Refills: 0    LORazepam (ATIVAN) 0.5 MG tablet Take 1 tablet (0.5 mg total) by mouth every 6 (six) hours as needed for anxiety. Qty: 30 tablet, Refills: 0    oxyCODONE (OXY IR/ROXICODONE) 5 MG immediate release tablet Take 1 tablet (5 mg total) by mouth every 4 (four) hours as needed. Qty: 30 tablet, Refills: 0    predniSONE (DELTASONE) 20 MG tablet Take 2 tablets daily for 3 days, then 1 tablet daily for 3 days, then half tablet daily for 3 days, then STOP. Qty: 12 tablet, Refills: 0      CONTINUE these medications which have NOT CHANGED   Details  albuterol (PROVENTIL HFA;VENTOLIN HFA) 108 (90 BASE) MCG/ACT inhaler Inhale 2 puffs into the lungs every 6 (six) hours as needed. For asthma     ibuprofen (ADVIL,MOTRIN) 200 MG tablet Take 400 mg by mouth every 6 (six) hours as needed. For pain    Potassium Citrate (UROCIT-K 15 PO) Take 1 capsule by mouth daily.      albuterol (PROVENTIL) (2.5 MG/3ML) 0.083% nebulizer solution Take 2.5 mg by nebulization every 6 (six) hours as needed. For asthma       STOP taking these medications     amoxicillin-clavulanate (AUGMENTIN) 875-125 MG per tablet      oxyCODONE-acetaminophen (PERCOCET) 10-325 MG per tablet          Discharged Condition: Stable.    Consults: None.  Significant Diagnostic Studies: Ir Fluoro Guide Cv Line Right  09/27/2011  * RADIOLOGY REPORT *  Clinical data: COPD exacerbation, needs IV access  PICC PLACEMENT WITH ULTRASOUND AND FLUOROSCOPY  Technique: After written informed consent was obtained, patient was placed in the supine position on angiographic table. Patency of the right basilic vein was confirmed with ultrasound with image documentation. An appropriate skin site was determined. Skin site was marked. Region was prepped using maximum barrier technique including cap and mask, sterile gown, sterile gloves, large sterile sheet, and Chlorhexidine   as cutaneous antisepsis.  The region was infiltrated locally with 1% lidocaine.   Under real-time ultrasound guidance, the right basilic vein was accessed with a 21 gauge micropuncture needle; the needle tip within the vein was confirmed with ultrasound image documentation.   Needle exchanged over a 018 guidewire for a peel-away sheath, through which a 5-French double- lumen power injectable PICC trimmed to 46cm was advanced, positioned with its tip near the cavoatrial junction.  Spot chest radiograph confirms appropriate catheter position.  Catheter was flushed per protocol and secured externally with 0-Prolene sutures. The patient tolerated procedure well, with no immediate complication.  IMPRESSION: Technically successful five [place] double lumen power injectable PICC placement  Original Report Authenticated By: Osa Craver, M.D.   Ir US Guide Vasc  Access Right  09/27/2011  * RADIOLOGY REPORT *  Clinical data: COPD exacerbation, needs IV access  PICC PLACEMENT WITH ULTRASOUND AND FLUOROSCOPY  Technique: After written informed consent was obtained, patient was placed in the supine position on angiographic table. Patency of the right basilic vein was confirmed with ultrasound with image documentation. An appropriate skin site was  determined. Skin site was marked. Region was prepped using maximum barrier technique including cap and mask, sterile gown, sterile gloves, large sterile sheet, and Chlorhexidine   as cutaneous antisepsis.  The region was infiltrated locally with 1% lidocaine.   Under real-time ultrasound guidance, the right basilic vein was accessed with a 21 gauge micropuncture needle; the needle tip within the vein was confirmed with ultrasound image documentation.   Needle exchanged over a 018 guidewire for a peel-away sheath, through which a 5-French double- lumen power injectable PICC trimmed to 46cm was advanced, positioned with its tip near the cavoatrial junction.  Spot chest radiograph confirms appropriate catheter position.  Catheter was flushed per protocol and secured externally with 0-Prolene sutures. The patient tolerated procedure well, with no immediate complication.  IMPRESSION: Technically successful five [place] double lumen power injectable PICC placement  Original Report Authenticated By: Osa Craver, M.D.   Dg Chest Portable 1 View  09/26/2011  *RADIOLOGY REPORT*  Clinical Data: Central venous catheter attempted  PORTABLE CHEST - 1 VIEW  Comparison: Earlier film of the same day  Findings: No pneumothorax. Lungs clear.  Heart size and pulmonary vascularity normal.  No effusion.  Visualized bones unremarkable.  IMPRESSION: No acute disease  Original Report Authenticated By: Osa Craver, M.D.   Dg Chest Portable 1 View  09/26/2011  *RADIOLOGY REPORT*  Clinical Data: Asthma  PORTABLE CHEST - 1 VIEW  Comparison:   the previous day's study  Findings: Lungs clear.  Heart size and pulmonary vascularity normal.  No effusion.  Visualized bones unremarkable.  IMPRESSION: No acute disease  Original Report Authenticated By: Osa Craver, M.D.   Dg Chest Portable 1 View  09/25/2011  *RADIOLOGY REPORT*  Clinical Data: Short of breath  PORTABLE CHEST - 1 VIEW  Comparison: 09/23/2011   Findings: Normal heart size.  Clear lungs.  No pneumothorax.  No pleural effusion.  IMPRESSION: No active cardiopulmonary disease.  Original Report Authenticated By: Donavan Burnet, M.D.   Dg Chest Portable 1 View  09/23/2011  *RADIOLOGY REPORT*  Clinical Data: Dyspnea.  Cough.  PORTABLE CHEST - 1 VIEW  Comparison: 1912  Findings: X-ray artifact projecting over the upper left chest. Midline trachea.  Normal heart size and mediastinal contours. No pleural effusion or pneumothorax.  Clear lungs.  IMPRESSION: Normal chest.  Original Report Authenticated By: Consuello Bossier, M.D.    Lab Results: Results for orders placed during the hospital encounter of 09/23/11 (from the past 48 hour(s))  GLUCOSE, CAPILLARY     Status: Abnormal   Collection Time   09/28/11 12:34 PM      Component Value Range Comment   Glucose-Capillary 150 (*) 70 - 99 (mg/dL)   GLUCOSE, CAPILLARY     Status: Abnormal   Collection Time   09/28/11  3:56 PM      Component Value Range Comment   Glucose-Capillary 149 (*) 70 - 99 (mg/dL)   GLUCOSE, CAPILLARY     Status: Abnormal   Collection Time   09/28/11  8:58 PM      Component Value Range Comment   Glucose-Capillary 144 (*) 70 -  99 (mg/dL)   GLUCOSE, CAPILLARY     Status: Abnormal   Collection Time   09/29/11  7:31 AM      Component Value Range Comment   Glucose-Capillary 132 (*) 70 - 99 (mg/dL)    Comment 1 Notify RN     GLUCOSE, CAPILLARY     Status: Abnormal   Collection Time   09/29/11 11:21 AM      Component Value Range Comment   Glucose-Capillary 140 (*) 70 - 99 (mg/dL)    Comment 1 Notify RN     GLUCOSE, CAPILLARY     Status: Abnormal   Collection Time   09/29/11  4:56 PM      Component Value Range Comment   Glucose-Capillary 132 (*) 70 - 99 (mg/dL)   GLUCOSE, CAPILLARY     Status: Abnormal   Collection Time   09/29/11  7:51 PM      Component Value Range Comment   Glucose-Capillary 138 (*) 70 - 99 (mg/dL)    Comment 1 Notify RN     GLUCOSE, CAPILLARY      Status: Abnormal   Collection Time   09/30/11  8:14 AM      Component Value Range Comment   Glucose-Capillary 140 (*) 70 - 99 (mg/dL)    Recent Results (from the past 240 hour(s))  MRSA PCR SCREENING     Status: Normal   Collection Time   09/24/11  5:54 PM      Component Value Range Status Comment   MRSA by PCR NEGATIVE  NEGATIVE  Final      Hospital Course: This 35 year old lady was admitted with two-day history of dyspnea and cough which was nonproductive. Please see initial history and physical examination done by Dr. Rito Ehrlich. The patient was admitted and started on intravenous steroids. Unfortunately she did not did not have good IV access and had to have a PICC line  inserted. She was gradually transitioned onto oral steroids. She is also on oral antibiotics now. She feels that she is better and back to her baseline. Unfortunately, she started to smoke cigarettes about 3 weeks ago again. Chest x-ray did not reveal any pneumonia.  Discharge Exam: Blood pressure 107/64, pulse 88, temperature 97.2 F (36.2 C), temperature source Oral, resp. rate 18, height 5\' 6"  (1.676 m), weight 128.5 kg (283 lb 4.7 oz), last menstrual period 09/03/2011, SpO2 98.00%. She looks systemically well. She is obese. She is not toxic or septic. There is no increased work of breathing. Lung fields show bilateral wheezing but she is not tight. There are no crackles or any bronchial breathing. Heart sounds are present and normal without murmurs or gallop rhythm. She is alert and orientated without any focal neurological signs.  Disposition: Home. She'll be given a tapering dose of steroids. We will try and help her with medications as she has financial difficulties. She will need to followup with the health department.  Discharge Orders    Future Orders Please Complete By Expires   Diet - low sodium heart healthy      Increase activity slowly      Call MD for:  temperature >100.4            Signed: Annaliesa Blann C 09/30/2011, 11:03 AM

## 2011-09-30 NOTE — Progress Notes (Signed)
Pt d/c home medication assistance given through [facility] medication [facility].

## 2011-12-17 ENCOUNTER — Other Ambulatory Visit (HOSPITAL_COMMUNITY): Payer: Self-pay | Admitting: Urology

## 2011-12-17 DIAGNOSIS — R109 Unspecified abdominal pain: Secondary | ICD-10-CM

## 2011-12-18 ENCOUNTER — Inpatient Hospital Stay (HOSPITAL_COMMUNITY): Admission: RE | Admit: 2011-12-18 | Payer: Self-pay | Source: Ambulatory Visit

## 2012-06-20 IMAGING — CR DG CHEST 2V
2 series · 2 of 2 positions shown · non-contrast
Comparison: 08/06/2011.

CLINICAL DATA: Cough.  Bilateral flank pain.

CHEST - 2 VIEW

[view not recorded (1 of 2)]
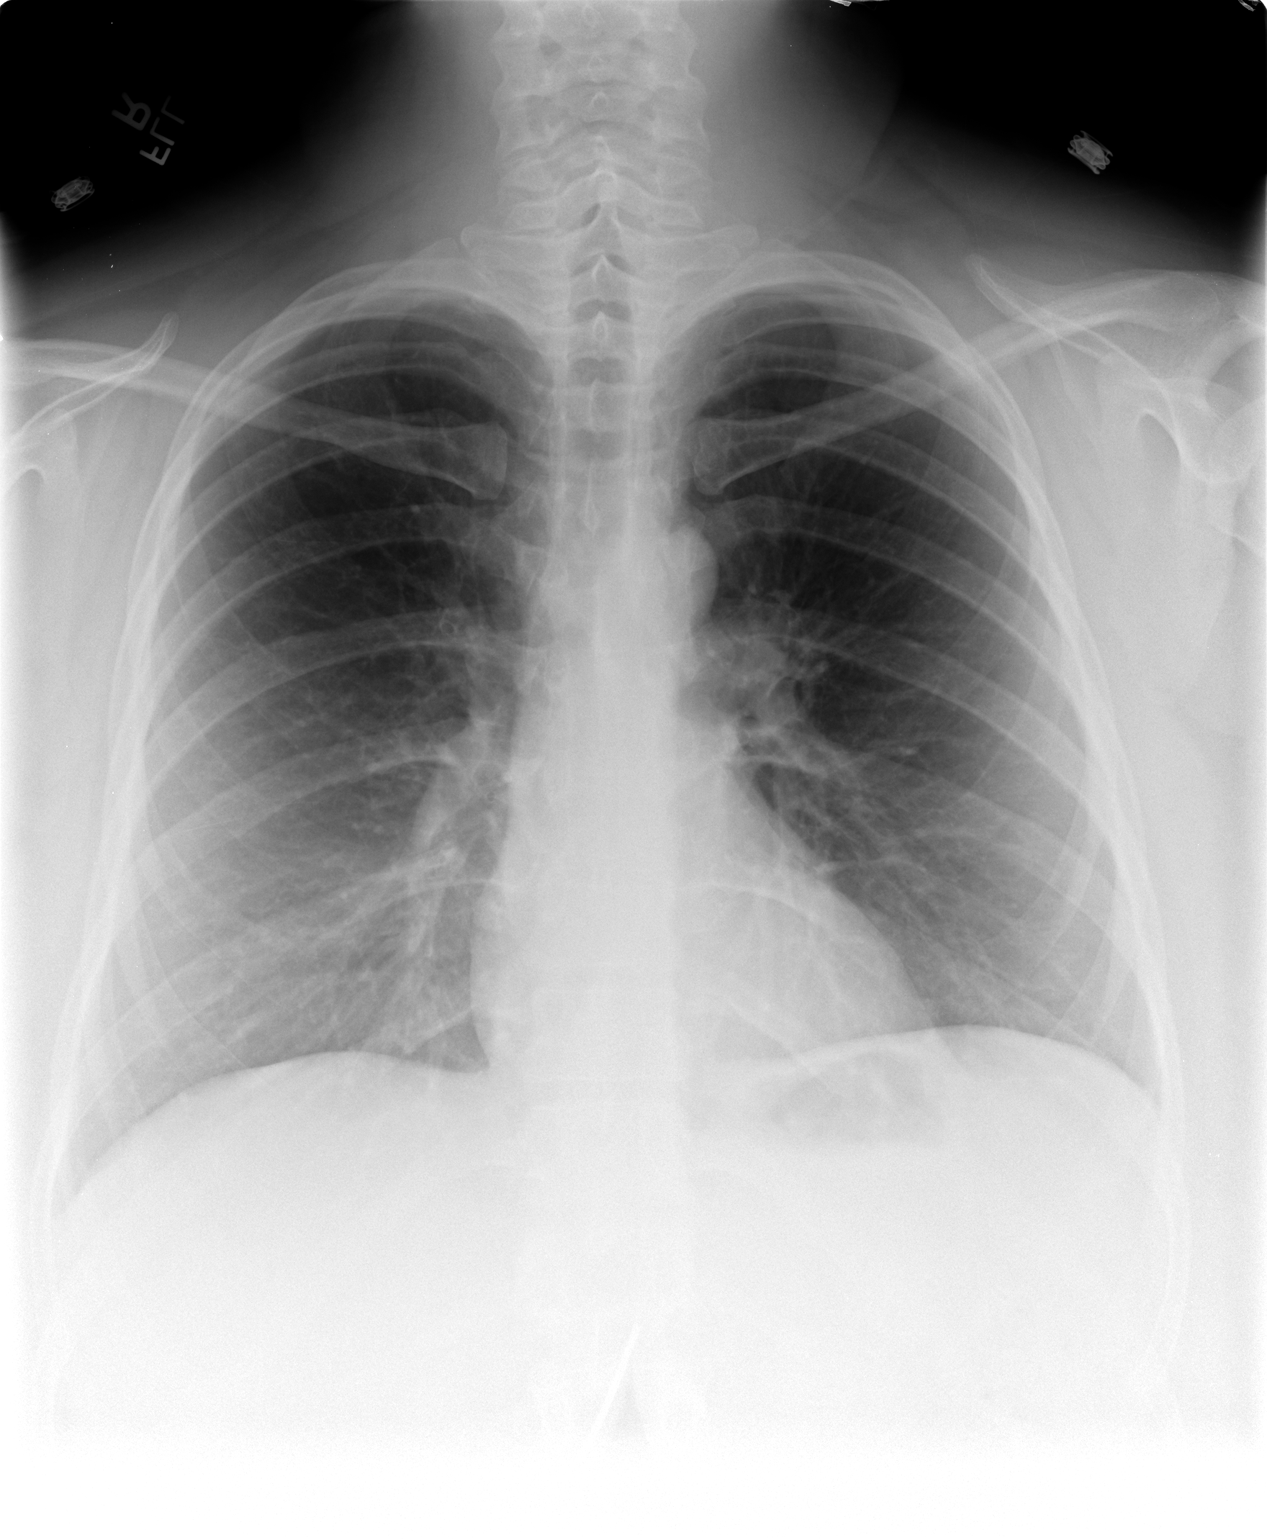

[view not recorded (2 of 2)]
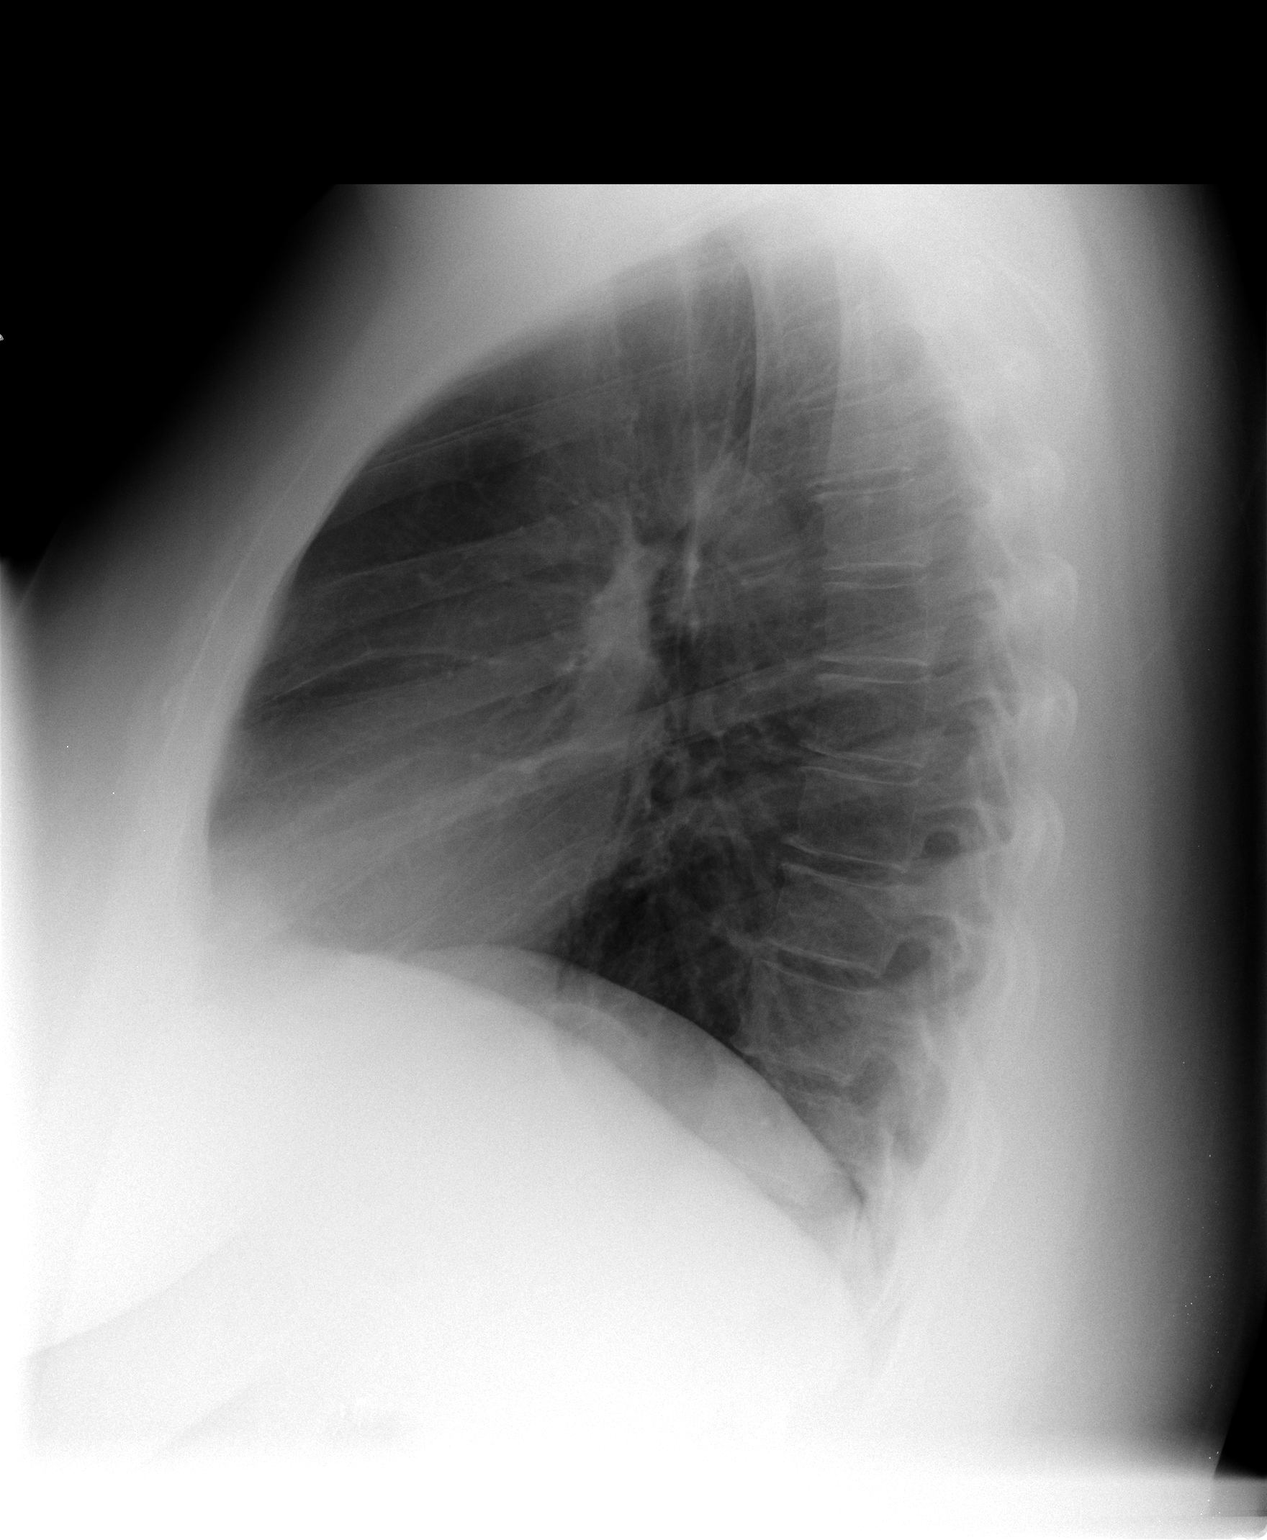

[2 of 2 positions shown; findings below may reference images not displayed]

FINDINGS: Lungs clear.  Cardiopericardial silhouette within normal
limits.  Trachea midline.  No airspace disease or effusion. Small
metallic density is present project over the midline of the upper
abdomen measuring 38 mm, not seen on the lateral view.  This may be
external to the patient.  This was not seen on prior examinations.
Cholecystectomy clips are noted on the lateral view.
IMPRESSION: No active cardiopulmonary disease. 38 mm metallic density in the
midline of the upper abdomen is probably external to the patient.

## 2014-06-14 ENCOUNTER — Emergency Department (HOSPITAL_COMMUNITY)
Admission: EM | Admit: 2014-06-14 | Discharge: 2014-06-14 | Disposition: A | Discharge status: Home / Self Care | Payer: Self-pay | Attending: Emergency Medicine | Admitting: Emergency Medicine

## 2014-06-14 ENCOUNTER — Encounter (HOSPITAL_COMMUNITY): Payer: Self-pay | Admitting: Emergency Medicine

## 2014-06-14 DIAGNOSIS — I10 Essential (primary) hypertension: Secondary | ICD-10-CM | POA: Insufficient documentation

## 2014-06-14 DIAGNOSIS — Z9851 Tubal ligation status: Secondary | ICD-10-CM | POA: Insufficient documentation

## 2014-06-14 DIAGNOSIS — F17200 Nicotine dependence, unspecified, uncomplicated: Secondary | ICD-10-CM | POA: Insufficient documentation

## 2014-06-14 DIAGNOSIS — J449 Chronic obstructive pulmonary disease, unspecified: Secondary | ICD-10-CM | POA: Insufficient documentation

## 2014-06-14 DIAGNOSIS — K5289 Other specified noninfective gastroenteritis and colitis: Secondary | ICD-10-CM | POA: Insufficient documentation

## 2014-06-14 DIAGNOSIS — K529 Noninfective gastroenteritis and colitis, unspecified: Secondary | ICD-10-CM

## 2014-06-14 DIAGNOSIS — Z87442 Personal history of urinary calculi: Secondary | ICD-10-CM | POA: Insufficient documentation

## 2014-06-14 DIAGNOSIS — J4489 Other specified chronic obstructive pulmonary disease: Secondary | ICD-10-CM | POA: Insufficient documentation

## 2014-06-14 DIAGNOSIS — Z79899 Other long term (current) drug therapy: Secondary | ICD-10-CM | POA: Insufficient documentation

## 2014-06-14 DIAGNOSIS — N390 Urinary tract infection, site not specified: Secondary | ICD-10-CM | POA: Insufficient documentation

## 2014-06-14 DIAGNOSIS — Z9089 Acquired absence of other organs: Secondary | ICD-10-CM | POA: Insufficient documentation

## 2014-06-14 DIAGNOSIS — Z3202 Encounter for pregnancy test, result negative: Secondary | ICD-10-CM | POA: Insufficient documentation

## 2014-06-14 LAB — URINALYSIS, ROUTINE W REFLEX MICROSCOPIC
BILIRUBIN URINE: NEGATIVE
Glucose, UA: NEGATIVE mg/dL
HGB URINE DIPSTICK: NEGATIVE
KETONES UR: NEGATIVE mg/dL
Leukocytes, UA: NEGATIVE
NITRITE: NEGATIVE
Protein, ur: 30 mg/dL — AB
Specific Gravity, Urine: 1.025 (ref 1.005–1.030)
UROBILINOGEN UA: 0.2 mg/dL (ref 0.0–1.0)
pH: 6 (ref 5.0–8.0)

## 2014-06-14 LAB — BASIC METABOLIC PANEL
ANION GAP: 11 (ref 5–15)
BUN: 8 mg/dL (ref 6–23)
CALCIUM: 8 mg/dL — AB (ref 8.4–10.5)
CHLORIDE: 101 meq/L (ref 96–112)
CO2: 27 mEq/L (ref 19–32)
CREATININE: 0.83 mg/dL (ref 0.50–1.10)
GFR, EST NON AFRICAN AMERICAN: 87 mL/min — AB (ref >90)
Glucose, Bld: 86 mg/dL (ref 70–99)
Potassium: 2.9 mEq/L — CL (ref 3.7–5.3)
Sodium: 139 mEq/L (ref 137–147)

## 2014-06-14 LAB — CBC WITH DIFFERENTIAL/PLATELET
BASOS ABS: 0 10*3/uL (ref 0.0–0.1)
BASOS PCT: 0 % (ref 0–1)
EOS ABS: 0 10*3/uL (ref 0.0–0.7)
EOS PCT: 0 % (ref 0–5)
HEMATOCRIT: 38.7 % (ref 36.0–46.0)
Hemoglobin: 12.9 g/dL (ref 12.0–15.0)
Lymphocytes Relative: 13 % (ref 12–46)
Lymphs Abs: 0.9 10*3/uL (ref 0.7–4.0)
MCH: 29.3 pg (ref 26.0–34.0)
MCHC: 33.3 g/dL (ref 30.0–36.0)
MCV: 87.8 fL (ref 78.0–100.0)
MONO ABS: 0.4 10*3/uL (ref 0.1–1.0)
MONOS PCT: 6 % (ref 3–12)
NEUTROS ABS: 5.4 10*3/uL (ref 1.7–7.7)
Neutrophils Relative %: 81 % — ABNORMAL HIGH (ref 43–77)
Platelets: 241 10*3/uL (ref 150–400)
RBC: 4.41 MIL/uL (ref 3.87–5.11)
RDW: 15 % (ref 11.5–15.5)
WBC: 6.7 10*3/uL (ref 4.0–10.5)

## 2014-06-14 LAB — URINE MICROSCOPIC-ADD ON

## 2014-06-14 LAB — PREGNANCY, URINE: Preg Test, Ur: NEGATIVE

## 2014-06-14 MED ORDER — TRAMADOL HCL 50 MG PO TABS: 50.0000 mg | ORAL_TABLET | Freq: Four times a day (QID) | ORAL | Status: DC | PRN

## 2014-06-14 MED ORDER — POTASSIUM CHLORIDE CRYS ER 20 MEQ PO TBCR
40.0000 meq | EXTENDED_RELEASE_TABLET | Freq: Once | ORAL | Status: AC
  Administered 2014-06-14: 40 meq via ORAL
  Filled 2014-06-14: qty 2

## 2014-06-14 MED ORDER — HYDROMORPHONE HCL PF 1 MG/ML IJ SOLN
1.0000 mg | Freq: Once | INTRAMUSCULAR | Status: AC
  Administered 2014-06-14: 1 mg via INTRAVENOUS
  Filled 2014-06-14: qty 1

## 2014-06-14 MED ORDER — POTASSIUM CHLORIDE CRYS ER 20 MEQ PO TBCR: 20.0000 meq | EXTENDED_RELEASE_TABLET | Freq: Every day | ORAL | Status: DC

## 2014-06-14 MED ORDER — PROMETHAZINE HCL 25 MG PO TABS: 25.0000 mg | ORAL_TABLET | Freq: Four times a day (QID) | ORAL | Status: DC | PRN

## 2014-06-14 MED ORDER — CEPHALEXIN 500 MG PO CAPS: 500.0000 mg | ORAL_CAPSULE | Freq: Four times a day (QID) | ORAL | Status: DC

## 2014-06-14 MED ORDER — ONDANSETRON 8 MG PO TBDP
ORAL_TABLET | ORAL | Status: AC
  Administered 2014-06-14: 8 mg via ORAL
  Filled 2014-06-14: qty 1

## 2014-06-14 MED ORDER — ONDANSETRON 8 MG PO TBDP
8.0000 mg | ORAL_TABLET | Freq: Once | ORAL | Status: AC
  Administered 2014-06-14: 8 mg via ORAL

## 2014-06-14 MED ORDER — ONDANSETRON HCL 4 MG/2ML IJ SOLN
4.0000 mg | Freq: Once | INTRAMUSCULAR | Status: AC
  Administered 2014-06-14: 4 mg via INTRAVENOUS
  Filled 2014-06-14: qty 2

## 2014-06-14 MED ORDER — DEXTROSE 5 % IV SOLN
1.0000 g | Freq: Once | INTRAVENOUS | Status: AC
  Administered 2014-06-14: 1 g via INTRAVENOUS
  Filled 2014-06-14: qty 10

## 2014-06-14 MED ORDER — SODIUM CHLORIDE 0.9 % IV BOLUS (SEPSIS)
1000.0000 mL | Freq: Once | INTRAVENOUS | Status: AC
  Administered 2014-06-14: 1000 mL via INTRAVENOUS

## 2014-06-14 NOTE — Discharge Instructions (Signed)
Drink plenty of fluids.  Follow up next week for rechceck

## 2014-06-14 NOTE — ED Provider Notes (Signed)
CSN: [id number]     Arrival date & time 06/14/14  1753 History   First MD Initiated Contact with Patient 06/14/14 1818     Chief Complaint  Patient presents with  . Emesis  . Flank Pain     (Consider location/radiation/quality/duration/timing/severity/associated sxs/prior Treatment) Patient is a 38 y.o. female presenting with vomiting and flank pain. The history is provided by the patient (the pt complains of vomiting).  Emesis Severity:  Moderate Timing:  Constant Quality:  Malodorous material Able to tolerate:  Liquids Progression:  Unchanged Chronicity:  New Recent urination:  Normal Context: not post-tussive   Relieved by:  Nothing Associated symptoms: no abdominal pain, no diarrhea and no headaches   Flank Pain Pertinent negatives include no chest pain, no abdominal pain and no headaches.    Past Medical History  Diagnosis Date  . Kidney stones   . COPD (chronic obstructive pulmonary disease)   . Asthma   . Hypertension    Past Surgical History  Procedure Laterality Date  . Cholecystectomy    . Tubal ligation    . Cystoscopy w/ retrogrades  08/04/2011    Procedure: CYSTOSCOPY WITH RETROGRADE PYELOGRAM;  Surgeon: Ky BarbanMohammad I Javaid;  Location: [facility];  Service: Urology;  Laterality: Right;  Right Retrograde   Family History  Problem Relation Age of Onset  . COPD Mother   . COPD Father   . Heart attack Father    History  Substance Use Topics  . Smoking status: Current Every Day Smoker -- 0.50 packs/day  . Smokeless tobacco: Not on file  . Alcohol Use: No   OB History   Grav Para Term Preterm Abortions TAB SAB Ect Mult Living   3 3 3       3      Review of Systems  Constitutional: Negative for appetite change and fatigue.  HENT: Negative for congestion, ear discharge and sinus pressure.   Eyes: Negative for discharge.  Respiratory: Negative for cough.   Cardiovascular: Negative for chest pain.  Gastrointestinal: Positive for vomiting. Negative for  abdominal pain and diarrhea.  Genitourinary: Positive for flank pain. Negative for frequency and hematuria.  Musculoskeletal: Negative for back pain.  Skin: Negative for rash.  Neurological: Negative for seizures and headaches.  Psychiatric/Behavioral: Negative for hallucinations.      Allergies  Hydrocodone; Morphine and related; and Vicodin  Home Medications   Prior to Admission medications   Medication Sig Start Date End Date Taking? Authorizing Provider  albuterol (PROVENTIL HFA;VENTOLIN HFA) 108 (90 BASE) MCG/ACT inhaler Inhale 2 puffs into the lungs every 6 (six) hours as needed. For asthma    Yes Historical Provider, MD  FLUoxetine (PROZAC) 10 MG capsule Take 1 capsule (10 mg total) by mouth daily. 09/30/11 06/14/14 Yes Nimish Normajean Glasgow Gosrani, MD  hydrOXYzine (VISTARIL) 25 MG capsule Take 25 mg by mouth 2 (two) times daily as needed for anxiety.   Yes Historical Provider, MD  albuterol (PROVENTIL) (2.5 MG/3ML) 0.083% nebulizer solution Take 2.5 mg by nebulization every 6 (six) hours as needed. For asthma     Historical Provider, MD  cephALEXin (KEFLEX) 500 MG capsule Take 1 capsule (500 mg total) by mouth 4 (four) times daily. 06/14/14   Benny LennertJoseph L Abdikadir Fohl, MD  potassium chloride SA (K-DUR,KLOR-CON) 20 MEQ tablet Take 1 tablet (20 mEq total) by mouth daily. 06/14/14   Benny LennertJoseph L Lluvia Gwynne, MD  promethazine (PHENERGAN) 25 MG tablet Take 1 tablet (25 mg total) by mouth every 6 (six) hours as needed for  nausea or vomiting. 06/14/14   Benny Lennert, MD  traMADol (ULTRAM) 50 MG tablet Take 1 tablet (50 mg total) by mouth every 6 (six) hours as needed. 06/14/14   Benny Lennert, MD   BP 120/52  Pulse 88  Temp(Src) 98.2 F (36.8 C) (Oral)  Resp 20  Ht 5\' 6"  (1.676 m)  Wt 280 lb (127.007 kg)  BMI 45.21 kg/m2  SpO2 94%  LMP 05/15/2014 Physical Exam  Constitutional: She is oriented to person, place, and time. She appears well-developed.  HENT:  Head: Normocephalic.  Eyes: Conjunctivae and EOM  are normal. No scleral icterus.  Neck: Neck supple. No thyromegaly present.  Cardiovascular: Normal rate and regular rhythm.  Exam reveals no gallop and no friction rub.   No murmur heard. Pulmonary/Chest: No stridor. She has no wheezes. She has no rales. She exhibits no tenderness.  Abdominal: She exhibits no distension. There is no tenderness. There is no rebound.  Musculoskeletal: Normal range of motion. She exhibits no edema.  Lymphadenopathy:    She has no cervical adenopathy.  Neurological: She is oriented to person, place, and time. She exhibits normal muscle tone. Coordination normal.  Skin: No rash noted. No erythema.  Psychiatric: She has a normal mood and affect. Her behavior is normal.    ED Course  Procedures (including critical care time) Labs Review Labs Reviewed  CBC WITH DIFFERENTIAL - Abnormal; Notable for the following:    Neutrophils Relative % 81 (*)    All other components within normal limits  BASIC METABOLIC PANEL - Abnormal; Notable for the following:    Potassium 2.9 (*)    Calcium 8.0 (*)    GFR calc non Af Amer 87 (*)    All other components within normal limits  URINALYSIS, ROUTINE W REFLEX MICROSCOPIC - Abnormal; Notable for the following:    Protein, ur 30 (*)    All other components within normal limits  URINE MICROSCOPIC-ADD ON - Abnormal; Notable for the following:    Squamous Epithelial / LPF MANY (*)    Bacteria, UA FEW (*)    All other components within normal limits  PREGNANCY, URINE    Imaging Review No results found.   EKG Interpretation None      MDM   Final diagnoses:  Gastroenteritis  UTI (lower urinary tract infection)        Benny Lennert, MD 06/14/14 2130

## 2014-06-14 NOTE — ED Notes (Signed)
Pt c/o n/v/d and r flank pain since yesterday morning.

## 2014-06-14 NOTE — ED Notes (Signed)
CRITICAL VALUE ALERT  Critical value received: Potassium 2.9  Date of notification:  06/14/14  Time of notification:  1917  Critical value read back: Yes  Nurse who received alert:  Alda LeaJennifer Enrico Eaddy, RN  MD notified:  Dr. Estell HarpinZammit  Time of notification:  [phone]

## 2014-06-14 NOTE — ED Notes (Signed)
MD at bedside. 

## 2014-10-01 ENCOUNTER — Encounter (HOSPITAL_COMMUNITY): Payer: Self-pay | Admitting: Emergency Medicine

## 2014-12-10 ENCOUNTER — Telehealth: Payer: Self-pay | Admitting: Family Medicine

## 2014-12-11 ENCOUNTER — Ambulatory Visit: Payer: Self-pay | Admitting: Family Medicine

## 2014-12-11 NOTE — Telephone Encounter (Signed)
Patient aware that we do not have any new patient appointments until February.

## 2015-02-12 ENCOUNTER — Emergency Department (HOSPITAL_COMMUNITY)
Admission: EM | Admit: 2015-02-12 | Discharge: 2015-02-12 | Disposition: A | Discharge status: Home / Self Care | Payer: 59 | Attending: Emergency Medicine | Admitting: Emergency Medicine

## 2015-02-12 ENCOUNTER — Encounter (HOSPITAL_COMMUNITY): Payer: Self-pay | Admitting: Emergency Medicine

## 2015-02-12 ENCOUNTER — Emergency Department (HOSPITAL_COMMUNITY): Payer: 59

## 2015-02-12 DIAGNOSIS — Z720 Tobacco use: Secondary | ICD-10-CM | POA: Insufficient documentation

## 2015-02-12 DIAGNOSIS — N200 Calculus of kidney: Secondary | ICD-10-CM

## 2015-02-12 DIAGNOSIS — J449 Chronic obstructive pulmonary disease, unspecified: Secondary | ICD-10-CM | POA: Diagnosis not present

## 2015-02-12 DIAGNOSIS — R109 Unspecified abdominal pain: Secondary | ICD-10-CM | POA: Diagnosis present

## 2015-02-12 DIAGNOSIS — Z3202 Encounter for pregnancy test, result negative: Secondary | ICD-10-CM | POA: Insufficient documentation

## 2015-02-12 DIAGNOSIS — Z9851 Tubal ligation status: Secondary | ICD-10-CM | POA: Diagnosis not present

## 2015-02-12 DIAGNOSIS — N390 Urinary tract infection, site not specified: Secondary | ICD-10-CM | POA: Diagnosis not present

## 2015-02-12 DIAGNOSIS — Z79899 Other long term (current) drug therapy: Secondary | ICD-10-CM | POA: Diagnosis not present

## 2015-02-12 DIAGNOSIS — A499 Bacterial infection, unspecified: Secondary | ICD-10-CM | POA: Insufficient documentation

## 2015-02-12 DIAGNOSIS — I10 Essential (primary) hypertension: Secondary | ICD-10-CM | POA: Diagnosis not present

## 2015-02-12 DIAGNOSIS — Z9049 Acquired absence of other specified parts of digestive tract: Secondary | ICD-10-CM | POA: Diagnosis not present

## 2015-02-12 DIAGNOSIS — Z792 Long term (current) use of antibiotics: Secondary | ICD-10-CM | POA: Diagnosis not present

## 2015-02-12 LAB — URINALYSIS, ROUTINE W REFLEX MICROSCOPIC
Glucose, UA: NEGATIVE mg/dL
NITRITE: NEGATIVE
Protein, ur: 100 mg/dL — AB
UROBILINOGEN UA: 0.2 mg/dL (ref 0.0–1.0)
pH: 6 (ref 5.0–8.0)

## 2015-02-12 LAB — PREGNANCY, URINE: PREG TEST UR: NEGATIVE

## 2015-02-12 LAB — URINE MICROSCOPIC-ADD ON

## 2015-02-12 MED ORDER — KETOROLAC TROMETHAMINE 30 MG/ML IJ SOLN
60.0000 mg | Freq: Once | INTRAMUSCULAR | Status: AC
  Administered 2015-02-12: 60 mg via INTRAMUSCULAR
  Filled 2015-02-12: qty 2

## 2015-02-12 MED ORDER — CEPHALEXIN 500 MG PO CAPS: 500.0000 mg | ORAL_CAPSULE | Freq: Four times a day (QID) | ORAL | Status: DC

## 2015-02-12 MED ORDER — LIDOCAINE HCL (PF) 1 % IJ SOLN
INTRAMUSCULAR | Status: AC
  Administered 2015-02-12: 2.1 mL
  Filled 2015-02-12: qty 5

## 2015-02-12 MED ORDER — OXYCODONE-ACETAMINOPHEN 5-325 MG PO TABS: 1.0000 | ORAL_TABLET | Freq: Four times a day (QID) | ORAL | Status: DC | PRN

## 2015-02-12 MED ORDER — OXYCODONE-ACETAMINOPHEN 5-325 MG PO TABS
2.0000 | ORAL_TABLET | Freq: Once | ORAL | Status: AC
  Administered 2015-02-12: 2 via ORAL
  Filled 2015-02-12: qty 2

## 2015-02-12 MED ORDER — FLUCONAZOLE 150 MG PO TABS: 150.0000 mg | ORAL_TABLET | Freq: Every day | ORAL | Status: DC

## 2015-02-12 MED ORDER — CEFTRIAXONE SODIUM 1 G IJ SOLR
1.0000 g | Freq: Once | INTRAMUSCULAR | Status: AC
  Administered 2015-02-12: 1 g via INTRAMUSCULAR
  Filled 2015-02-12: qty 10

## 2015-02-12 NOTE — ED Provider Notes (Signed)
CSN: [id number]     Arrival date & time 02/12/15  1706 History   First MD Initiated Contact with Patient 02/12/15 2031     Chief Complaint  Patient presents with  . Flank Pain     (Consider location/radiation/quality/duration/timing/severity/associated sxs/prior Treatment) HPI Comments: Patient is a 39 year old female with history of kidney stones. She presents for evaluation of severe right flank pain that started this morning. She has felt nauseated all day. She denies any fevers or chills. She denies any blood in her urine or dysuria.  Patient is a 39 y.o. female presenting with flank pain. The history is provided by the patient.  Flank Pain This is a new problem. Episode onset: This morning. The problem occurs constantly. The problem has been rapidly worsening. Associated symptoms include abdominal pain. Pertinent negatives include no chest pain. Nothing aggravates the symptoms. Nothing relieves the symptoms. Treatments tried: Ibuprofen. The treatment provided no relief.    Past Medical History  Diagnosis Date  . Kidney stones   . COPD (chronic obstructive pulmonary disease)   . Asthma   . Hypertension    Past Surgical History  Procedure Laterality Date  . Cholecystectomy    . Tubal ligation    . Cystoscopy w/ retrogrades  08/04/2011    Procedure: CYSTOSCOPY WITH RETROGRADE PYELOGRAM;  Surgeon: Ky BarbanMohammad I Javaid;  Location: [facility];  Service: Urology;  Laterality: Right;  Right Retrograde   Family History  Problem Relation Age of Onset  . COPD Mother   . COPD Father   . Heart attack Father    History  Substance Use Topics  . Smoking status: Current Every Day Smoker -- 0.50 packs/day  . Smokeless tobacco: Not on file  . Alcohol Use: No   OB History    Gravida Para Term Preterm AB TAB SAB Ectopic Multiple Living   3 3 3       3      Review of Systems  Cardiovascular: Negative for chest pain.  Gastrointestinal: Positive for abdominal pain.  Genitourinary: Positive for  flank pain.  All other systems reviewed and are negative.     Allergies  Hydrocodone; Morphine and related; and Vicodin  Home Medications   Prior to Admission medications   Medication Sig Start Date End Date Taking? Authorizing Provider  albuterol (PROVENTIL HFA;VENTOLIN HFA) 108 (90 BASE) MCG/ACT inhaler Inhale 2 puffs into the lungs every 6 (six) hours as needed. For asthma     Historical Provider, MD  albuterol (PROVENTIL) (2.5 MG/3ML) 0.083% nebulizer solution Take 2.5 mg by nebulization every 6 (six) hours as needed. For asthma     Historical Provider, MD  cephALEXin (KEFLEX) 500 MG capsule Take 1 capsule (500 mg total) by mouth 4 (four) times daily. 06/14/14   Bethann BerkshireJoseph Zammit, MD  FLUoxetine (PROZAC) 10 MG capsule Take 1 capsule (10 mg total) by mouth daily. 09/30/11 06/14/14  Nimish Normajean Glasgow Gosrani, MD  hydrOXYzine (VISTARIL) 25 MG capsule Take 25 mg by mouth 2 (two) times daily as needed for anxiety.    Historical Provider, MD  potassium chloride SA (K-DUR,KLOR-CON) 20 MEQ tablet Take 1 tablet (20 mEq total) by mouth daily. 06/14/14   Bethann BerkshireJoseph Zammit, MD  promethazine (PHENERGAN) 25 MG tablet Take 1 tablet (25 mg total) by mouth every 6 (six) hours as needed for nausea or vomiting. 06/14/14   Bethann BerkshireJoseph Zammit, MD  traMADol (ULTRAM) 50 MG tablet Take 1 tablet (50 mg total) by mouth every 6 (six) hours as needed. 06/14/14   Jomarie LongsJoseph  Zammit, MD   BP 128/74 mmHg  Pulse 93  Temp(Src) 98.6 F (37 C) (Oral)  Resp 16  Ht  (1.676 m)  Wt 276 lb (125.193 kg)  BMI 44.57 kg/m2  SpO2 100%  LMP 01/29/2015 Physical Exam  Constitutional: She is oriented to person, place, and time. She appears well-developed and well-nourished. No distress.  HENT:  Head: Normocephalic and atraumatic.  Neck: Normal range of motion. Neck supple.  Cardiovascular: Normal rate and regular rhythm.  Exam reveals no gallop and no friction rub.   No murmur heard. Pulmonary/Chest: Effort normal and breath sounds normal. No  respiratory distress. She has no wheezes.  Abdominal: Soft. Bowel sounds are normal. She exhibits no distension. There is tenderness. There is no rebound and no guarding.  There is tenderness to palpation in the right flank and right mid abdomen  Musculoskeletal: Normal range of motion.  Neurological: She is alert and oriented to person, place, and time.  Skin: Skin is warm and dry. She is not diaphoretic.  Nursing note and vitals reviewed.   ED Course  Procedures (including critical care time) Labs Review Labs Reviewed  URINALYSIS, ROUTINE W REFLEX MICROSCOPIC - Abnormal; Notable for the following:    APPearance HAZY (*)    Specific Gravity, Urine >1.030 (*)    Hgb urine dipstick LARGE (*)    Bilirubin Urine SMALL (*)    Ketones, ur TRACE (*)    Protein, ur 100 (*)    Leukocytes, UA SMALL (*)    All other components within normal limits  URINE MICROSCOPIC-ADD ON - Abnormal; Notable for the following:    Squamous Epithelial / LPF MANY (*)    Bacteria, UA FEW (*)    All other components within normal limits  PREGNANCY, URINE    Imaging Review No results found.   EKG Interpretation None      MDM   Final diagnoses:  None    Patient presents with right flank pain. She has a history of renal calculi. Her workup today reveals a urinary tract infection along with hematuria. Her CT scan does show multiple calculi within the renal parenchyma, however no stones that are causing obstruction or hydronephrosis. Her discomfort appears to be related to a urinary tract infection. She will be treated with IM Rocephin, Keflex, pain medication, and when necessary return.    Geoffery Lyons, MD 02/12/15 2149

## 2015-02-12 NOTE — Discharge Instructions (Signed)
Keflex as prescribed.  Percocet as prescribed as needed for pain.  Follow-up with urology if not improving in the next 2-3 days.   Urinary Tract Infection Urinary tract infections (UTIs) can develop anywhere along your urinary tract. Your urinary tract is your body's drainage system for removing wastes and extra water. Your urinary tract includes two kidneys, two ureters, a bladder, and a urethra. Your kidneys are a pair of bean-shaped organs. Each kidney is about the size of your fist. They are located below your ribs, one on each side of your spine. CAUSES Infections are caused by microbes, which are microscopic organisms, including fungi, viruses, and bacteria. These organisms are so small that they can only be seen through a microscope. Bacteria are the microbes that most commonly cause UTIs. SYMPTOMS  Symptoms of UTIs may vary by age and gender of the patient and by the location of the infection. Symptoms in young women typically include a frequent and intense urge to urinate and a painful, burning feeling in the bladder or urethra during urination. Older women and men are more likely to be tired, shaky, and weak and have muscle aches and abdominal pain. A fever may mean the infection is in your kidneys. Other symptoms of a kidney infection include pain in your back or sides below the ribs, nausea, and vomiting. DIAGNOSIS To diagnose a UTI, your caregiver will ask you about your symptoms. Your caregiver also will ask to provide a urine sample. The urine sample will be tested for bacteria and white blood cells. White blood cells are made by your body to help fight infection. TREATMENT  Typically, UTIs can be treated with medication. Because most UTIs are caused by a bacterial infection, they usually can be treated with the use of antibiotics. The choice of antibiotic and length of treatment depend on your symptoms and the type of bacteria causing your infection. HOME CARE INSTRUCTIONS  If you  were prescribed antibiotics, take them exactly as your caregiver instructs you. Finish the medication even if you feel better after you have only taken some of the medication.  Drink enough water and fluids to keep your urine clear or pale yellow.  Avoid caffeine, tea, and carbonated beverages. They tend to irritate your bladder.  Empty your bladder often. Avoid holding urine for long periods of time.  Empty your bladder before and after sexual intercourse.  After a bowel movement, women should cleanse from front to back. Use each tissue only once. SEEK MEDICAL CARE IF:   You have back pain.  You develop a fever.  Your symptoms do not begin to resolve within 3 days. SEEK IMMEDIATE MEDICAL CARE IF:   You have severe back pain or lower abdominal pain.  You develop chills.  You have nausea or vomiting.  You have continued burning or discomfort with urination. MAKE SURE YOU:   Understand these instructions.  Will watch your condition.  Will get help right away if you are not doing well or get worse. Document Released: 08/26/2005 Document Revised: 05/17/2012 Document Reviewed: 12/25/2011 [facility] Patient Information 2015 [place], [place]. This information is not intended to replace advice given to you by your health care provider. Make sure you discuss any questions you have with your health care provider.  Kidney Stones Kidney stones (urolithiasis) are deposits that form inside your kidneys. The intense pain is caused by the stone moving through the urinary tract. When the stone moves, the ureter goes into spasm around the stone. The stone is usually  passed in the urine.  CAUSES   A disorder that makes certain neck glands produce too much parathyroid hormone (primary hyperparathyroidism).  A buildup of uric acid crystals, similar to gout in your joints.  Narrowing (stricture) of the ureter.  A kidney obstruction present at birth (congenital obstruction).  Previous surgery  on the kidney or ureters.  Numerous kidney infections. SYMPTOMS   Feeling sick to your stomach (nauseous).  Throwing up (vomiting).  Blood in the urine (hematuria).  Pain that usually spreads (radiates) to the groin.  Frequency or urgency of urination. DIAGNOSIS   Taking a history and physical exam.  Blood or urine tests.  CT scan.  Occasionally, an examination of the inside of the urinary bladder (cystoscopy) is performed. TREATMENT   Observation.  Increasing your fluid intake.  Extracorporeal shock wave lithotripsy--This is a noninvasive procedure that uses shock waves to break up kidney stones.  Surgery may be needed if you have severe pain or persistent obstruction. There are various surgical procedures. Most of the procedures are performed with the use of small instruments. Only small incisions are needed to accommodate these instruments, so recovery time is minimized. The size, location, and chemical composition are all important variables that will determine the proper choice of action for you. Talk to your health care provider to better understand your situation so that you will minimize the risk of injury to yourself and your kidney.  HOME CARE INSTRUCTIONS   Drink enough water and fluids to keep your urine clear or pale yellow. This will help you to pass the stone or stone fragments.  Strain all urine through the provided strainer. Keep all particulate matter and stones for your health care provider to see. The stone causing the pain may be as small as a grain of salt. It is very important to use the strainer each and every time you pass your urine. The collection of your stone will allow your health care provider to analyze it and verify that a stone has actually passed. The stone analysis will often identify what you can do to reduce the incidence of recurrences.  Only take over-the-counter or prescription medicines for pain, discomfort, or fever as directed by your  health care provider.  Make a follow-up appointment with your health care provider as directed.  Get follow-up X-rays if required. The absence of pain does not always mean that the stone has passed. It may have only stopped moving. If the urine remains completely obstructed, it can cause loss of kidney function or even complete destruction of the kidney. It is your responsibility to make sure X-rays and follow-ups are completed. Ultrasounds of the kidney can show blockages and the status of the kidney. Ultrasounds are not associated with any radiation and can be performed easily in a matter of minutes. SEEK MEDICAL CARE IF:  You experience pain that is progressive and unresponsive to any pain medicine you have been prescribed. SEEK IMMEDIATE MEDICAL CARE IF:   Pain cannot be controlled with the prescribed medicine.  You have a fever or shaking chills.  The severity or intensity of pain increases over 18 hours and is not relieved by pain medicine.  You develop a new onset of abdominal pain.  You feel faint or pass out.  You are unable to urinate. MAKE SURE YOU:   Understand these instructions.  Will watch your condition.  Will get help right away if you are not doing well or get worse. Document Released: 11/16/2005 Document Revised: 07/19/2013  Document Reviewed: 04/19/2013 [facility] Patient Information 2015 [place], [place]. This information is not intended to replace advice given to you by your health care provider. Make sure you discuss any questions you have with your health care provider.

## 2015-02-12 NOTE — ED Notes (Signed)
Having pain to right side, rates pain 10.  Took ibuprofen this am with no relief.

## 2015-09-05 ENCOUNTER — Ambulatory Visit (INDEPENDENT_AMBULATORY_CARE_PROVIDER_SITE_OTHER): Payer: Managed Care, Other (non HMO) | Admitting: *Deleted

## 2015-09-05 DIAGNOSIS — Z23 Encounter for immunization: Secondary | ICD-10-CM

## 2015-11-12 ENCOUNTER — Encounter: Payer: Self-pay | Admitting: Family Medicine

## 2015-11-12 ENCOUNTER — Ambulatory Visit (INDEPENDENT_AMBULATORY_CARE_PROVIDER_SITE_OTHER): Payer: Managed Care, Other (non HMO) | Admitting: Family Medicine

## 2015-11-12 VITALS — BP 138/84 | HR 95 | Temp 97.3°F | Ht 66.0 in | Wt 286.8 lb

## 2015-11-12 DIAGNOSIS — Z720 Tobacco use: Secondary | ICD-10-CM

## 2015-11-12 DIAGNOSIS — J4540 Moderate persistent asthma, uncomplicated: Secondary | ICD-10-CM | POA: Insufficient documentation

## 2015-11-12 DIAGNOSIS — Z716 Tobacco abuse counseling: Secondary | ICD-10-CM | POA: Diagnosis not present

## 2015-11-12 DIAGNOSIS — J4541 Moderate persistent asthma with (acute) exacerbation: Secondary | ICD-10-CM | POA: Diagnosis not present

## 2015-11-12 MED ORDER — BUPROPION HCL ER (SR) 150 MG PO TB12: 150.0000 mg | ORAL_TABLET | Freq: Two times a day (BID) | ORAL | Status: DC

## 2015-11-12 MED ORDER — BECLOMETHASONE DIPROPIONATE 80 MCG/ACT IN AERS: 1.0000 | INHALATION_SPRAY | Freq: Two times a day (BID) | RESPIRATORY_TRACT | Status: DC

## 2015-11-12 NOTE — Assessment & Plan Note (Signed)
Patient has asthma versus COPD, she is still on steroids from the hospital and Levaquin from the hospital. She was also given Qvar to start from the hospital and albuterol. She is still having wheezing but is feeling a lot better than she was prior to going to the hospital. We will see her back in a week to see how she is doing. We'll give Wellbutrin for smoking cessation

## 2015-11-12 NOTE — Progress Notes (Signed)
BP 138/84 mmHg  Pulse 95  Temp(Src) 97.3 F (36.3 C) (Oral)  Ht  (1.676 m)  Wt 286 lb 12.8 oz (130.092 kg)  BMI 46.31 kg/m2  SpO2 97%  LMP 11/05/2015   Subjective:    Patient ID: [name], female    DOB: [birth year], 39 y.o.   MRN: [id number]  HPI: [name] is a 39 y.o. female presenting on 11/12/2015 for Hospital followup   HPI Shortness of breath and wheezing Patient is coming in today for a three-day hospital follow-up. She was in the hospital starting 5 days ago and was discharged 3 days ago for an acute asthma/COPD exacerbation. She admits to being a smoker of up to a pack and half per day for at least 20 years. She was out of insurance for a while and only had her rescue albuterol inhaler and was using that multiple times per day before she went into the hospital. During the hospital to start her on steroids and gave her oxygen and nebulized albuterol.  Relevant past medical, surgical, family and social history reviewed and updated as indicated. Interim medical history since our last visit reviewed. Allergies and medications reviewed and updated.  Review of Systems  Constitutional: Negative for fever and chills.  HENT: Positive for congestion, postnasal drip, rhinorrhea, sinus pressure and sore throat. Negative for ear discharge, ear pain and sneezing.   Eyes: Negative for pain, redness and visual disturbance.  Respiratory: Positive for cough and wheezing. Negative for chest tightness and shortness of breath.   Cardiovascular: Negative for chest pain and leg swelling.  Genitourinary: Negative for dysuria and difficulty urinating.  Musculoskeletal: Negative for back pain and gait problem.  Skin: Negative for rash.  Neurological: Negative for light-headedness and headaches.  Psychiatric/Behavioral: Negative for behavioral problems and agitation.  All other systems reviewed and are negative.   Per HPI unless specifically indicated above       Medication List       This list is accurate as of: 11/12/15 11:55 AM.  Always use your most recent med list.               albuterol 108 (90 BASE) MCG/ACT inhaler  Commonly known as:  PROVENTIL HFA;VENTOLIN HFA  Inhale 2 puffs into the lungs every 6 (six) hours as needed. For asthma     albuterol (2.5 MG/3ML) 0.083% nebulizer solution  Commonly known as:  PROVENTIL  Take 2.5 mg by nebulization every 6 (six) hours as needed. For asthma     beclomethasone 80 MCG/ACT inhaler  Commonly known as:  QVAR  Inhale 1 puff into the lungs 2 (two) times daily.     Dextromethorphan-Guaifenesin 10-200 MG/5ML Liqd  Take 5 mLs by mouth every 6 (six) hours.     docusate sodium 100 MG capsule  Commonly known as:  COLACE  Take 100 mg by mouth 2 (two) times daily.     ferrous gluconate 324 MG tablet  Commonly known as:  FERGON  Take 324 mg by mouth 2 (two) times daily with a meal.     fluconazole 150 MG tablet  Commonly known as:  DIFLUCAN  Take 1 tablet (150 mg total) by mouth daily.     FLUoxetine 10 MG capsule  Commonly known as:  PROZAC  Take 1 capsule (10 mg total) by mouth daily.     Hydrocodone-Chlorpheniramine 5-4 MG/5ML Soln  Take 5 mLs by mouth 2 (two) times daily as needed.     ibuprofen  200 MG tablet  Commonly known as:  ADVIL,MOTRIN  Take 600 mg by mouth every 6 (six) hours as needed for mild pain or moderate pain.     ipratropium-albuterol 0.5-2.5 (3) MG/3ML Soln  Commonly known as:  DUONEB  Take 3 mLs by nebulization every 4 (four) hours as needed.     levofloxacin 750 MG tablet  Commonly known as:  LEVAQUIN  Take 750 mg by mouth daily.     potassium chloride SA 20 MEQ tablet  Commonly known as:  K-DUR,KLOR-CON  Take 1 tablet (20 mEq total) by mouth daily.     promethazine 25 MG tablet  Commonly known as:  PHENERGAN  Take 1 tablet (25 mg total) by mouth every 6 (six) hours as needed for nausea or vomiting.     traMADol 50 MG tablet  Commonly known as:   ULTRAM  Take 1 tablet (50 mg total) by mouth every 6 (six) hours as needed.           Objective:    BP 138/84 mmHg  Pulse 95  Temp(Src) 97.3 F (36.3 C) (Oral)  Ht  (1.676 m)  Wt 286 lb 12.8 oz (130.092 kg)  BMI 46.31 kg/m2  SpO2 97%  LMP 11/05/2015  Wt Readings from Last 3 Encounters:  11/12/15 286 lb 12.8 oz (130.092 kg)  02/12/15 276 lb (125.193 kg)  06/14/14 280 lb (127.007 kg)    Physical Exam  Constitutional: She is oriented to person, place, and time. She appears well-developed and well-nourished. No distress.  HENT:  Right Ear: Tympanic membrane, external ear and ear canal normal.  Left Ear: Tympanic membrane, external ear and ear canal normal.  Nose: Mucosal edema and rhinorrhea present. No epistaxis. Right sinus exhibits no maxillary sinus tenderness and no frontal sinus tenderness. Left sinus exhibits no maxillary sinus tenderness and no frontal sinus tenderness.  Mouth/Throat: Uvula is midline and mucous membranes are normal. Posterior oropharyngeal edema and posterior oropharyngeal erythema present. No oropharyngeal exudate or tonsillar abscesses.  Eyes: Conjunctivae and EOM are normal.  Neck: Neck supple. No thyromegaly present.  Cardiovascular: Normal rate, regular rhythm, normal heart sounds and intact distal pulses.   No murmur heard. Pulmonary/Chest: Effort normal. No respiratory distress. She has wheezes. She has no rales.  Musculoskeletal: Normal range of motion. She exhibits no edema or tenderness.  Lymphadenopathy:    She has no cervical adenopathy.  Neurological: She is alert and oriented to person, place, and time. Coordination normal.  Skin: Skin is warm and dry. No rash noted. She is not diaphoretic.  Psychiatric: She has a normal mood and affect. Her behavior is normal.  Vitals reviewed.   Results for orders placed or performed during the hospital encounter of 02/12/15  Pregnancy, urine  Result Value Ref Range   Preg Test, Ur NEGATIVE  NEGATIVE  Urinalysis, Routine w reflex microscopic  Result Value Ref Range   Color, Urine YELLOW YELLOW   APPearance HAZY (A) CLEAR   Specific Gravity, Urine >1.030 (H) 1.005 - 1.030   pH 6.0 5.0 - 8.0   Glucose, UA NEGATIVE NEGATIVE mg/dL   Hgb urine dipstick LARGE (A) NEGATIVE   Bilirubin Urine SMALL (A) NEGATIVE   Ketones, ur TRACE (A) NEGATIVE mg/dL   Protein, ur 161 (A) NEGATIVE mg/dL   Urobilinogen, UA 0.2 0.0 - 1.0 mg/dL   Nitrite NEGATIVE NEGATIVE   Leukocytes, UA SMALL (A) NEGATIVE  Urine microscopic-add on  Result Value Ref Range   Squamous Epithelial / LPF  MANY (A) RARE   WBC, UA TOO NUMEROUS TO COUNT <3 WBC/hpf   RBC / HPF TOO NUMEROUS TO COUNT <3 RBC/hpf   Bacteria, UA FEW (A) RARE      Assessment & Plan:   Problem List Items Addressed This Visit      Respiratory   Asthma, moderate persistent - Primary    Patient has asthma versus COPD, she is still on steroids from the hospital and Levaquin from the hospital. She was also given Qvar to start from the hospital and albuterol. She is still having wheezing but is feeling a lot better than she was prior to going to the hospital. We will see her back in a week to see how she is doing. We'll give Wellbutrin for smoking cessation      Relevant Medications   ipratropium-albuterol (DUONEB) 0.5-2.5 (3) MG/3ML SOLN   beclomethasone (QVAR) 80 MCG/ACT inhaler   buPROPion (WELLBUTRIN SR) 150 MG 12 hr tablet    Other Visit Diagnoses    Encounter for smoking cessation counseling        Relevant Medications    buPROPion (WELLBUTRIN SR) 150 MG 12 hr tablet        Follow up plan: Return in about 1 week (around 11/19/2015), or if symptoms worsen or fail to improve, for Asthma follow up.  Counseling provided for all of the vaccine components No orders of the defined types were placed in this encounter.    Arville CareJoshua Dettinger, MD [facility] 11/12/2015, 11:55 AM

## 2016-01-17 ENCOUNTER — Encounter: Payer: Self-pay | Admitting: Family Medicine

## 2016-01-17 ENCOUNTER — Ambulatory Visit (INDEPENDENT_AMBULATORY_CARE_PROVIDER_SITE_OTHER): Payer: Managed Care, Other (non HMO) | Admitting: Family Medicine

## 2016-01-17 VITALS — BP 144/90 | HR 92 | Temp 97.9°F | Ht 66.0 in | Wt 269.2 lb

## 2016-01-17 DIAGNOSIS — J441 Chronic obstructive pulmonary disease with (acute) exacerbation: Secondary | ICD-10-CM | POA: Diagnosis not present

## 2016-01-17 MED ORDER — DOXYCYCLINE HYCLATE 100 MG PO TABS: 100.0000 mg | ORAL_TABLET | Freq: Two times a day (BID) | ORAL | Status: DC

## 2016-01-17 MED ORDER — ALBUTEROL SULFATE HFA 108 (90 BASE) MCG/ACT IN AERS: 2.0000 | INHALATION_SPRAY | Freq: Four times a day (QID) | RESPIRATORY_TRACT | Status: DC | PRN

## 2016-01-17 MED ORDER — PREDNISONE 20 MG PO TABS: ORAL_TABLET | ORAL | Status: DC

## 2016-01-17 MED ORDER — HYDROCODONE-HOMATROPINE 5-1.5 MG/5ML PO SYRP: 5.0000 mL | ORAL_SOLUTION | Freq: Four times a day (QID) | ORAL | Status: DC | PRN

## 2016-01-17 MED ORDER — HYDROCOD POLST-CPM POLST ER 10-8 MG/5ML PO SUER: 5.0000 mL | Freq: Three times a day (TID) | ORAL | Status: DC | PRN

## 2016-01-17 NOTE — Patient Instructions (Signed)
Great to meet you!  Chronic Obstructive Pulmonary Disease Chronic obstructive pulmonary disease (COPD) is a common lung condition in which airflow from the lungs is limited. COPD is a general term that can be used to describe many different lung problems that limit airflow, including both chronic bronchitis and emphysema. If you have COPD, your lung function will probably never return to normal, but there are measures you can take to improve lung function and make yourself feel better. CAUSES   Smoking (common).  Exposure to secondhand smoke.  Genetic problems.  Chronic inflammatory lung diseases or recurrent infections. SYMPTOMS  Shortness of breath, especially with physical activity.  Deep, persistent (chronic) cough with a large amount of thick mucus.  Wheezing.  Rapid breaths (tachypnea).  Gray or bluish discoloration (cyanosis) of the skin, especially in your fingers, toes, or lips.  Fatigue.  Weight loss.  Frequent infections or episodes when breathing symptoms become much worse (exacerbations).  Chest tightness. DIAGNOSIS Your health care provider will take a medical history and perform a physical examination to diagnose COPD. Additional tests for COPD may include:  Lung (pulmonary) function tests.  Chest X-ray.  CT scan.  Blood tests. TREATMENT  Treatment for COPD may include:  Inhaler and nebulizer medicines. These help manage the symptoms of COPD and make your breathing more comfortable.  Supplemental oxygen. Supplemental oxygen is only helpful if you have a low oxygen level in your blood.  Exercise and physical activity. These are beneficial for nearly all people with COPD.  Lung surgery or transplant.  Nutrition therapy to gain weight, if you are underweight.  Pulmonary rehabilitation. This may involve working with a team of health care providers and specialists, such as respiratory, occupational, and physical therapists. HOME CARE  INSTRUCTIONS  Take all medicines (inhaled or pills) as directed by your health care provider.  Avoid over-the-counter medicines or cough syrups that dry up your airway (such as antihistamines) and slow down the elimination of secretions unless instructed otherwise by your health care provider.  If you are a smoker, the most important thing that you can do is stop smoking. Continuing to smoke will cause further lung damage and breathing trouble. Ask your health care provider for help with quitting smoking. He or she can direct you to community resources or hospitals that provide support.  Avoid exposure to irritants such as smoke, chemicals, and fumes that aggravate your breathing.  Use oxygen therapy and pulmonary rehabilitation if directed by your health care provider. If you require home oxygen therapy, ask your health care provider whether you should purchase a pulse oximeter to measure your oxygen level at home.  Avoid contact with individuals who have a contagious illness.  Avoid extreme temperature and humidity changes.  Eat healthy foods. Eating smaller, more frequent meals and resting before meals may help you maintain your strength.  Stay active, but balance activity with periods of rest. Exercise and physical activity will help you maintain your ability to do things you want to do.  Preventing infection and hospitalization is very important when you have COPD. Make sure to receive all the vaccines your health care provider recommends, especially the pneumococcal and influenza vaccines. Ask your health care provider whether you need a pneumonia vaccine.  Learn and use relaxation techniques to manage stress.  Learn and use controlled breathing techniques as directed by your health care provider. Controlled breathing techniques include:  Pursed lip breathing. Start by breathing in (inhaling) through your nose for 1 second. Then, purse your  lips as if you were going to whistle and  breathe out (exhale) through the pursed lips for 2 seconds.  Diaphragmatic breathing. Start by putting one hand on your abdomen just above your waist. Inhale slowly through your nose. The hand on your abdomen should move out. Then purse your lips and exhale slowly. You should be able to feel the hand on your abdomen moving in as you exhale.  Learn and use controlled coughing to clear mucus from your lungs. Controlled coughing is a series of short, progressive coughs. The steps of controlled coughing are: 1. Lean your head slightly forward. 2. Breathe in deeply using diaphragmatic breathing. 3. Try to hold your breath for 3 seconds. 4. Keep your mouth slightly open while coughing twice. 5. Spit any mucus out into a tissue. 6. Rest and repeat the steps once or twice as needed. SEEK MEDICAL CARE IF:  You are coughing up more mucus than usual.  There is a change in the color or thickness of your mucus.  Your breathing is more labored than usual.  Your breathing is faster than usual. SEEK IMMEDIATE MEDICAL CARE IF:  You have shortness of breath while you are resting.  You have shortness of breath that prevents you from:  Being able to talk.  Performing your usual physical activities.  You have chest pain lasting longer than 5 minutes.  Your skin color is more cyanotic than usual.  You measure low oxygen saturations for longer than 5 minutes with a pulse oximeter. MAKE SURE YOU:  Understand these instructions.  Will watch your condition.  Will get help right away if you are not doing well or get worse.   This information is not intended to replace advice given to you by your health care provider. Make sure you discuss any questions you have with your health care provider.   Document Released: 08/26/2005 Document Revised: 12/07/2014 Document Reviewed: 07/13/2013 Elsevier Interactive Patient Education [employer].

## 2016-01-17 NOTE — Addendum Note (Signed)
Addended by: Elenora Gamma on: 01/17/2016 05:46 PM   Modules accepted: Orders, Medications

## 2016-01-17 NOTE — Progress Notes (Signed)
   HPI  Patient presents today Today with acute illness.  Patient explains that over the last 2 weeks she's had persistent cough, dyspnea, and feeling ill.  He states that she is tolerating foods and fluids easily. She has increased chest pain whenever she coughs or or takes a deep breath. She's out of albuterol needs a refill, her nebulizers helping a lot.  She was hospitalized in December, 2 months ago treated with Levaquin at that time  PMH: Smoking status noted ROS: Per HPI  Objective: BP 144/90 mmHg  Pulse 92  Temp(Src) 97.9 F (36.6 C) (Oral)  Ht  (1.676 m)  Wt 269 lb 3.2 oz (122.108 kg)  BMI 43.47 kg/m2  SpO2 98% Gen: NAD, alert, cooperative with exam HEENT: NCAT, tMs normal bilaterally, nares clear, oropharynx clear CV: RRR, good S1/S2, no murmur Resp: CTABL, no wheezes, non-labored, scattered wheeze Ext: No edema, warm Neuro: Alert and oriented, No gross deficits  Assessment and plan:  # COPD exacerbation Treat with prednisone and doxycycline Hycodan for nighttime cough- iscussed this will not be refilled routinely Supportive care, return to clinic for any concerns  Likely need to escalate from Qvar to a stronger controller medication Follow-up in 2 weeks   Murtis Sink, MD [facility] 01/17/2016, 5:39 PM     '

## 2016-03-19 ENCOUNTER — Encounter: Payer: Self-pay | Admitting: Family Medicine

## 2016-09-01 ENCOUNTER — Ambulatory Visit (INDEPENDENT_AMBULATORY_CARE_PROVIDER_SITE_OTHER): Payer: Managed Care, Other (non HMO)

## 2016-09-01 DIAGNOSIS — Z23 Encounter for immunization: Secondary | ICD-10-CM | POA: Diagnosis not present

## 2018-02-18 ENCOUNTER — Encounter (HOSPITAL_COMMUNITY): Payer: Self-pay | Admitting: *Deleted

## 2018-02-18 ENCOUNTER — Emergency Department (HOSPITAL_COMMUNITY)
Admission: EM | Admit: 2018-02-18 | Discharge: 2018-02-19 | Discharge status: Against Medical Advice | Payer: Self-pay | Attending: Emergency Medicine | Admitting: Emergency Medicine

## 2018-02-18 ENCOUNTER — Emergency Department (HOSPITAL_COMMUNITY): Payer: Self-pay

## 2018-02-18 ENCOUNTER — Encounter (HOSPITAL_COMMUNITY): Payer: Self-pay | Admitting: Emergency Medicine

## 2018-02-18 ENCOUNTER — Emergency Department (HOSPITAL_COMMUNITY)
Admission: EM | Admit: 2018-02-18 | Discharge: 2018-02-18 | Disposition: A | Discharge status: Home / Self Care | Payer: Self-pay | Attending: Emergency Medicine | Admitting: Emergency Medicine

## 2018-02-18 ENCOUNTER — Other Ambulatory Visit: Payer: Self-pay

## 2018-02-18 DIAGNOSIS — Z79899 Other long term (current) drug therapy: Secondary | ICD-10-CM | POA: Insufficient documentation

## 2018-02-18 DIAGNOSIS — N200 Calculus of kidney: Secondary | ICD-10-CM | POA: Insufficient documentation

## 2018-02-18 DIAGNOSIS — J449 Chronic obstructive pulmonary disease, unspecified: Secondary | ICD-10-CM | POA: Insufficient documentation

## 2018-02-18 DIAGNOSIS — N23 Unspecified renal colic: Secondary | ICD-10-CM | POA: Insufficient documentation

## 2018-02-18 DIAGNOSIS — I10 Essential (primary) hypertension: Secondary | ICD-10-CM | POA: Insufficient documentation

## 2018-02-18 DIAGNOSIS — J45909 Unspecified asthma, uncomplicated: Secondary | ICD-10-CM | POA: Insufficient documentation

## 2018-02-18 DIAGNOSIS — N390 Urinary tract infection, site not specified: Secondary | ICD-10-CM | POA: Insufficient documentation

## 2018-02-18 DIAGNOSIS — F17210 Nicotine dependence, cigarettes, uncomplicated: Secondary | ICD-10-CM | POA: Insufficient documentation

## 2018-02-18 DIAGNOSIS — F17200 Nicotine dependence, unspecified, uncomplicated: Secondary | ICD-10-CM | POA: Insufficient documentation

## 2018-02-18 DIAGNOSIS — R112 Nausea with vomiting, unspecified: Secondary | ICD-10-CM | POA: Insufficient documentation

## 2018-02-18 LAB — BASIC METABOLIC PANEL
ANION GAP: 9 (ref 5–15)
BUN: 11 mg/dL (ref 6–20)
CALCIUM: 8.6 mg/dL — AB (ref 8.9–10.3)
CO2: 24 mmol/L (ref 22–32)
Chloride: 105 mmol/L (ref 101–111)
Creatinine, Ser: 0.85 mg/dL (ref 0.44–1.00)
GFR calc Af Amer: >60 mL/min (ref >60)
GFR calc non Af Amer: >60 mL/min (ref >60)
Glucose, Bld: 98 mg/dL (ref 65–99)
Potassium: 4.2 mmol/L (ref 3.5–5.1)
SODIUM: 138 mmol/L (ref 135–145)

## 2018-02-18 LAB — CBC WITH DIFFERENTIAL/PLATELET
BASOS ABS: 0 10*3/uL (ref 0.0–0.1)
Basophils Relative: 0 %
Eosinophils Absolute: 0.1 10*3/uL (ref 0.0–0.7)
Eosinophils Relative: 1 %
HEMATOCRIT: 42.8 % (ref 36.0–46.0)
Hemoglobin: 13.9 g/dL (ref 12.0–15.0)
Lymphocytes Relative: 26 %
Lymphs Abs: 3 10*3/uL (ref 0.7–4.0)
MCH: 30 pg (ref 26.0–34.0)
MCHC: 32.5 g/dL (ref 30.0–36.0)
MCV: 92.4 fL (ref 78.0–100.0)
MONOS PCT: 6 %
Monocytes Absolute: 0.7 10*3/uL (ref 0.1–1.0)
NEUTROS ABS: 7.6 10*3/uL (ref 1.7–7.7)
Neutrophils Relative %: 67 %
Platelets: 273 10*3/uL (ref 150–400)
RBC: 4.63 MIL/uL (ref 3.87–5.11)
RDW: 13 % (ref 11.5–15.5)
WBC: 11.4 10*3/uL — ABNORMAL HIGH (ref 4.0–10.5)

## 2018-02-18 LAB — URINALYSIS, ROUTINE W REFLEX MICROSCOPIC
Bilirubin Urine: NEGATIVE
Glucose, UA: NEGATIVE mg/dL
Ketones, ur: NEGATIVE mg/dL
Nitrite: POSITIVE — AB
Protein, ur: 30 mg/dL — AB
SPECIFIC GRAVITY, URINE: 1.017 (ref 1.005–1.030)
pH: 5 (ref 5.0–8.0)

## 2018-02-18 MED ORDER — METOCLOPRAMIDE HCL 5 MG/ML IJ SOLN
5.0000 mg | Freq: Once | INTRAMUSCULAR | Status: AC
  Administered 2018-02-18: 5 mg via INTRAVENOUS
  Filled 2018-02-18: qty 2

## 2018-02-18 MED ORDER — TAMSULOSIN HCL 0.4 MG PO CAPS: 0.4000 mg | ORAL_CAPSULE | Freq: Every day | ORAL | 0 refills | Status: DC

## 2018-02-18 MED ORDER — KETOROLAC TROMETHAMINE 30 MG/ML IJ SOLN
30.0000 mg | Freq: Once | INTRAMUSCULAR | Status: AC
Start: 2018-02-18 — End: 2018-02-19
  Administered 2018-02-19: 30 mg via INTRAVENOUS
  Filled 2018-02-18: qty 1

## 2018-02-18 MED ORDER — HYDROMORPHONE HCL 1 MG/ML IJ SOLN
1.0000 mg | Freq: Once | INTRAMUSCULAR | Status: AC
  Administered 2018-02-19: 1 mg via INTRAVENOUS
  Filled 2018-02-18: qty 1

## 2018-02-18 MED ORDER — OXYCODONE-ACETAMINOPHEN 5-325 MG PO TABS: 1.0000 | ORAL_TABLET | Freq: Four times a day (QID) | ORAL | 0 refills | Status: DC | PRN

## 2018-02-18 MED ORDER — SODIUM CHLORIDE 0.9 % IV SOLN
1.0000 g | Freq: Once | INTRAVENOUS | Status: AC
  Administered 2018-02-18: 1 g via INTRAVENOUS
  Filled 2018-02-18: qty 10

## 2018-02-18 MED ORDER — CIPROFLOXACIN HCL 500 MG PO TABS: 500.0000 mg | ORAL_TABLET | Freq: Two times a day (BID) | ORAL | 0 refills | Status: DC

## 2018-02-18 MED ORDER — FLUCONAZOLE 150 MG PO TABS: 150.0000 mg | ORAL_TABLET | Freq: Once | ORAL | 0 refills | Status: AC

## 2018-02-18 MED ORDER — KETOROLAC TROMETHAMINE 30 MG/ML IJ SOLN
30.0000 mg | Freq: Once | INTRAMUSCULAR | Status: AC
  Administered 2018-02-18: 30 mg via INTRAVENOUS
  Filled 2018-02-18: qty 1

## 2018-02-18 MED ORDER — SODIUM CHLORIDE 0.9 % IV BOLUS (SEPSIS)
1000.0000 mL | Freq: Once | INTRAVENOUS | Status: AC
  Administered 2018-02-19: 1000 mL via INTRAVENOUS

## 2018-02-18 MED ORDER — PROMETHAZINE HCL 25 MG/ML IJ SOLN
25.0000 mg | Freq: Once | INTRAMUSCULAR | Status: AC
  Administered 2018-02-19: 25 mg via INTRAVENOUS
  Filled 2018-02-18: qty 1

## 2018-02-18 MED ORDER — METOCLOPRAMIDE HCL 10 MG PO TABS: 10.0000 mg | ORAL_TABLET | Freq: Four times a day (QID) | ORAL | 0 refills | Status: DC | PRN

## 2018-02-18 NOTE — ED Triage Notes (Signed)
Pt reports continued right flank and right lower abd. Pt was seen here earlier and was told she had 2 kidney stones. Pt reports continued pain and n/v.

## 2018-02-18 NOTE — ED Provider Notes (Signed)
[facility] EMERGENCY DEPARTMENT Provider Note   CSN: [id number] Arrival date & time: 02/18/18  4098     History   Chief Complaint Chief Complaint  Patient presents with  . Flank Pain    HPI Poppi Scantling Hanko is a 42 y.o. female.  HPI Complains of right sided flank pain radiating to right groin onset 6 AM today feels like kidney stone she has had in the past and nothing makes symptoms better or worse.  Associated symptoms include nausea no vomiting no fever.  No other associated symptoms.  No treatment prior to coming here Past Medical History:  Diagnosis Date  . Asthma   . COPD (chronic obstructive pulmonary disease) (HCC)   . Hypertension   . Kidney stones     Patient Active Problem List   Diagnosis Date Noted  . Asthma, moderate persistent 11/12/2015  . Leukocytosis 09/25/2011  . Anxiety 09/25/2011  . Acute exacerbation of COPD with asthma (HCC) 09/24/2011  . Acute bronchitis 09/23/2011  . Aspiration of gastric contents 08/04/2011  . COPD (chronic obstructive pulmonary disease) (HCC) 08/04/2011  . Kidney stones 08/04/2011    Past Surgical History:  Procedure Laterality Date  . ABDOMINAL HYSTERECTOMY    . CHOLECYSTECTOMY    . CYSTOSCOPY W/ RETROGRADES  08/04/2011   Procedure: CYSTOSCOPY WITH RETROGRADE PYELOGRAM;  Surgeon: Ky Barban;  Location: [facility];  Service: Urology;  Laterality: Right;  Right Retrograde  . LITHOTRIPSY    . TUBAL LIGATION      OB History    Gravida  3   Para  3   Term  3   Preterm      AB      Living  3     SAB      TAB      Ectopic      Multiple      Live Births               Home Medications    Prior to Admission medications   Medication Sig Start Date End Date Taking? Authorizing Provider  albuterol (PROVENTIL HFA;VENTOLIN HFA) 108 (90 Base) MCG/ACT inhaler Inhale 2 puffs into the lungs every 6 (six) hours as needed. For asthma 01/17/16  Yes Elenora Gamma, MD    Family History Family History    Problem Relation Age of Onset  . COPD Mother   . COPD Father   . Heart attack Father     Social History Social History   Tobacco Use  . Smoking status: Current Every Day Smoker    Packs/day: 0.50  Substance Use Topics  . Alcohol use: No  . Drug use: No     Allergies   Hydrocodone; Morphine and related; and Vicodin [hydrocodone-acetaminophen]   Review of Systems Review of Systems  Constitutional: Negative.   HENT: Negative.   Respiratory: Negative.   Cardiovascular: Negative.   Gastrointestinal: Negative.   Genitourinary: Positive for flank pain.  Skin: Negative.   Neurological: Negative.   Psychiatric/Behavioral: Negative.   All other systems reviewed and are negative.    Physical Exam Updated Vital Signs BP (!) 148/92 (BP Location: Left Arm)   Pulse 94   Temp 97.7 F (36.5 C) (Oral)   Resp 20   Ht 5\' 6"  (1.676 m)   Wt 118.8 kg (262 lb)   LMP 11/05/2015   SpO2 100%   BMI 42.29 kg/m   Physical Exam  Constitutional: She appears well-developed and well-nourished. She appears distressed.  Appears  uncomfortable writhing on the bed  HENT:  Head: Normocephalic and atraumatic.  Eyes: Pupils are equal, round, and reactive to light. Conjunctivae are normal.  Neck: Neck supple. No tracheal deviation present. No thyromegaly present.  Cardiovascular: Normal rate and regular rhythm.  No murmur heard. Pulmonary/Chest: Effort normal and breath sounds normal.  Abdominal: Soft. Bowel sounds are normal. She exhibits no distension. There is no tenderness.  Obese  Genitourinary:  Genitourinary Comments: No flank tenderness  Musculoskeletal: Normal range of motion. She exhibits no edema or tenderness.  Neurological: She is alert. Coordination normal.  Skin: Skin is warm and dry. No rash noted.  Psychiatric: She has a normal mood and affect.  Nursing note and vitals reviewed.    ED Treatments / Results  Labs (all labs ordered are listed, but only abnormal results  are displayed) Labs Reviewed  URINALYSIS, ROUTINE W REFLEX MICROSCOPIC - Abnormal; Notable for the following components:      Result Value   APPearance HAZY (*)    Hgb urine dipstick LARGE (*)    Protein, ur 30 (*)    Nitrite POSITIVE (*)    Leukocytes, UA SMALL (*)    Bacteria, UA RARE (*)    Squamous Epithelial / LPF 6-30 (*)    All other components within normal limits  BASIC METABOLIC PANEL - Abnormal; Notable for the following components:   Calcium 8.6 (*)    All other components within normal limits  CBC WITH DIFFERENTIAL/PLATELET - Abnormal; Notable for the following components:   WBC 11.4 (*)    All other components within normal limits    EKG  EKG Interpretation None       Radiology No results found.  Procedures Procedures (including critical care time)  Medications Ordered in ED Medications  ketorolac (TORADOL) 30 MG/ML injection 30 mg (30 mg Intravenous Given 02/18/18 1045)  metoCLOPramide (REGLAN) injection 5 mg (5 mg Intravenous Given 02/18/18 1045)     Initial Impression / Assessment and Plan / ED Course  I have reviewed the triage vital signs and the nursing notes.  Pertinent labs & imaging results that were available during my care of the patient were reviewed by me and considered in my medical decision making (see chart for details).     1 PM pain is improved after treatment intravenous Toradol.  Nausea has resolved after treatment with IV Reglan.  I discussed case with Dr. Liliane ShiWinter by telephone.  Since patient has had no vomiting chills or fever she can be discharged with antibiotic.  He suggests Cipro.  As well as prescriptions for Percocet, Flomax, Reglan.  She received Rocephin 1 g IV while here.  Urine sent for culture. [place] Controlled Substance reporting System queried  [facility]  Return cautions given for fever intractable vomiting or intractable pain . will also write prescription for Diflucan as preventive  for vaginitis at patient request Final Clinical Impressions(s) / ED Diagnoses  Dx#1 ureteral colic #2 urinary tract infection Final diagnoses:  None    ED Discharge Orders    None       Doug SouJacubowitz, Olinda Nola, MD 02/18/18 1334

## 2018-02-18 NOTE — ED Provider Notes (Signed)
[facility] EMERGENCY DEPARTMENT Provider Note   CSN: [id number] Arrival date & time: 02/18/18  2225     History   Chief Complaint Chief Complaint  Patient presents with  . Flank Pain    HPI [name] is a 42 y.o. female.  Patient returns with persistent nausea and vomiting since about 8:30 PM.  States she was seen earlier today for right-sided kidney stones with UTI.  She was feeling okay when she left but developed severe pain around 5:30 PM and took 1 Percocet.  She has not been able to keep anything else down including her antibiotics at home.  She has had chills but no fever.  Has right-sided flank and abdominal pain similar to this morning.  No chest pain or shortness of breath.  She still able to urinate with difficulty and pain.  The history is provided by the patient.  Flank Pain  Associated symptoms include abdominal pain. Pertinent negatives include no chest pain, no headaches and no shortness of breath.    Past Medical History:  Diagnosis Date  . Asthma   . COPD (chronic obstructive pulmonary disease) (HCC)   . Hypertension   . Kidney stones     Patient Active Problem List   Diagnosis Date Noted  . Asthma, moderate persistent 11/12/2015  . Leukocytosis 09/25/2011  . Anxiety 09/25/2011  . Acute exacerbation of COPD with asthma (HCC) 09/24/2011  . Acute bronchitis 09/23/2011  . Aspiration of gastric contents 08/04/2011  . COPD (chronic obstructive pulmonary disease) (HCC) 08/04/2011  . Kidney stones 08/04/2011    Past Surgical History:  Procedure Laterality Date  . ABDOMINAL HYSTERECTOMY    . CHOLECYSTECTOMY    . CYSTOSCOPY W/ RETROGRADES  08/04/2011   Procedure: CYSTOSCOPY WITH RETROGRADE PYELOGRAM;  Surgeon: Ky Barban;  Location: [facility];  Service: Urology;  Laterality: Right;  Right Retrograde  . LITHOTRIPSY    . TUBAL LIGATION       OB History    Gravida  3   Para  3   Term  3   Preterm      AB      Living  3     SAB     TAB      Ectopic      Multiple      Live Births               Home Medications    Prior to Admission medications   Medication Sig Start Date End Date Taking? Authorizing Provider  albuterol (PROVENTIL HFA;VENTOLIN HFA) 108 (90 Base) MCG/ACT inhaler Inhale 2 puffs into the lungs every 6 (six) hours as needed. For asthma 01/17/16   Elenora Gamma, MD  ciprofloxacin (CIPRO) 500 MG tablet Take 1 tablet (500 mg total) by mouth 2 (two) times daily. One po bid x 7 days 02/18/18   Doug Sou, MD  fluconazole (DIFLUCAN) 150 MG tablet Take 1 tablet (150 mg total) by mouth once for 1 dose. 02/18/18 02/18/18  Doug Sou, MD  metoCLOPramide (REGLAN) 10 MG tablet Take 1 tablet (10 mg total) by mouth every 6 (six) hours as needed for nausea (nausea/headache). 02/18/18   Doug Sou, MD  oxyCODONE-acetaminophen (PERCOCET) 5-325 MG tablet Take 1-2 tablets by mouth every 6 (six) hours as needed for severe pain. 02/18/18   Doug Sou, MD  tamsulosin (FLOMAX) 0.4 MG CAPS capsule Take 1 capsule (0.4 mg total) by mouth daily. 02/18/18   Doug Sou, MD    Family  History Family History  Problem Relation Age of Onset  . COPD Mother   . COPD Father   . Heart attack Father     Social History Social History   Tobacco Use  . Smoking status: Current Every Day Smoker    Packs/day: 0.50  . Smokeless tobacco: Never Used  Substance Use Topics  . Alcohol use: No  . Drug use: No     Allergies   Hydrocodone; Morphine and related; and Vicodin [hydrocodone-acetaminophen]   Review of Systems Review of Systems  Constitutional: Positive for activity change, appetite change and chills. Negative for fever.  HENT: Negative for congestion and rhinorrhea.   Eyes: Negative for visual disturbance.  Respiratory: Negative for cough, chest tightness and shortness of breath.   Cardiovascular: Negative for chest pain.  Gastrointestinal: Positive for abdominal pain, nausea and vomiting.    Genitourinary: Positive for dysuria, flank pain and hematuria.  Musculoskeletal: Positive for back pain. Negative for myalgias.  Neurological: Negative for dizziness, weakness and headaches.   all other systems are negative except as noted in the HPI and PMH.     Physical Exam Updated Vital Signs BP 124/79 (BP Location: Right Arm)   Pulse (!) 104   Temp 98.2 F (36.8 C) (Oral)   LMP 11/05/2015   SpO2 98%   Physical Exam  Constitutional: She is oriented to person, place, and time. She appears well-developed and well-nourished. She appears distressed.  Actively vomiting  HENT:  Head: Normocephalic and atraumatic.  Mouth/Throat: Oropharynx is clear and moist. No oropharyngeal exudate.  Eyes: Pupils are equal, round, and reactive to light. Conjunctivae and EOM are normal.  Neck: Normal range of motion. Neck supple.  No meningismus.  Cardiovascular: Normal rate, regular rhythm, normal heart sounds and intact distal pulses.  No murmur heard. Pulmonary/Chest: Effort normal and breath sounds normal. No respiratory distress.  Abdominal: Soft. There is tenderness. There is guarding. There is no rebound.  Right-sided abdominal pain with voluntary guarding.  Musculoskeletal: Normal range of motion. She exhibits tenderness. She exhibits no edema.  Right CVA tenderness  Neurological: She is alert and oriented to person, place, and time. No cranial nerve deficit. She exhibits normal muscle tone. Coordination normal.  No ataxia on finger to nose bilaterally. No pronator drift. 5/5 strength throughout. CN 2-12 intact.Equal grip strength. Sensation intact.   Skin: Skin is warm.  Psychiatric: She has a normal mood and affect. Her behavior is normal.  Nursing note and vitals reviewed.    ED Treatments / Results  Labs (all labs ordered are listed, but only abnormal results are displayed) Labs Reviewed  CBC WITH DIFFERENTIAL/PLATELET - Abnormal; Notable for the following components:       Result Value   WBC 17.3 (*)    Neutro Abs 14.9 (*)    Monocytes Absolute 1.1 (*)    All other components within normal limits  BASIC METABOLIC PANEL - Abnormal; Notable for the following components:   Glucose, Bld 129 (*)    Creatinine, Ser 1.16 (*)    GFR calc non Af Amer 57 (*)    All other components within normal limits  URINALYSIS, ROUTINE W REFLEX MICROSCOPIC - Abnormal; Notable for the following components:   APPearance HAZY (*)    Hgb urine dipstick MODERATE (*)    Leukocytes, UA SMALL (*)    Bacteria, UA RARE (*)    Squamous Epithelial / LPF 6-30 (*)    All other components within normal limits  URINE CULTURE  EKG None  Radiology Ct Renal Stone Study  Result Date: 02/18/2018 CLINICAL DATA:  Right flank pain with nausea EXAM: CT ABDOMEN AND PELVIS WITHOUT CONTRAST TECHNIQUE: Multidetector CT imaging of the abdomen and pelvis was performed following the standard protocol without oral or IV contrast. COMPARISON:  September 06, 2017 FINDINGS: Lower chest: There is bibasilar scarring anteriorly. Lung bases otherwise are clear. There is a focal hiatal hernia. Hepatobiliary: No focal liver lesions are evident on this noncontrast enhanced study. Gallbladder is absent. There is no biliary duct dilatation. Pancreas: There is no pancreatic mass or inflammatory focus. Spleen: No splenic lesions are evident. Adrenals/Urinary Tract: Adrenals bilaterally appear normal. There is no appreciable renal mass on either side. Right kidney is mildly edematous. There is severe hydronephrosis on the right. There is no appreciable hydronephrosis on the left. There is mild nephrocalcinosis bilaterally. In the posterior mid right kidney, there is a calculus measuring 1.3 x 0.9 cm. In the lower pole right kidney, there is a calculus measuring 9 x 9 mm with a nearby 2 mm calculus. There is a nearby 5 3 mm calculus as well as a nearby 3 x 2 mm calculus. On the left, there is a 4 x 2 mm calculus in the upper pole  region. There is a 2 mm calculus in the lower pole left kidney. There is a calculus in the proximal right ureter just beyond the ureteropelvic junction measuring 0.9 x 0.8 cm. Slightly inferior to this more proximal no other ureteral calculi are evident on either side. Urinary bladder is midline with wall thickness within normal limits. Calculus, there is a second calculus in the proximal right ureter measuring 1.0 x 0.4 cm. No other ureteral calculi are evident. Urinary bladder is midline with wall thickness within normal limits. Stomach/Bowel: There is no appreciable bowel wall or mesenteric thickening. No evident bowel obstruction. No free air or portal venous air. Vascular/Lymphatic: There is no abdominal aortic aneurysm. No vascular lesions are evident. There is no appreciable adenopathy in the abdomen or pelvis. Reproductive: Prostate and seminal vesicles are normal in size and contour. There is no evident pelvic mass. Other: Appendix appears normal. There is no ascites or abscess in the abdomen or pelvis. Musculoskeletal: There are no blastic or lytic bone lesions. There is no intramuscular or abdominal wall lesion evident. IMPRESSION: 1. Severe hydronephrosis on the right. There is a 0.9 x 0.8 cm calculus in the proximal right ureter just beyond the right ureteropelvic junction. Slightly inferior to this calculus, there is a second calculus in the proximal right ureter measuring 1.0 x 0.4 cm. Right kidney is mildly edematous. 2. Calculi in both kidneys, more on the right than on the left. Bilateral nephrocalcinosis. 3.  No bowel obstruction.  No abscess.  Appendix appears normal. 4.  Focal hiatal hernia. 5.  Gallbladder absent. Electronically Signed   By: Bretta Bang III M.D.   On: 02/18/2018 11:17    Procedures Procedures (including critical care time)  Medications Ordered in ED Medications  sodium chloride 0.9 % bolus 1,000 mL (has no administration in time range)  promethazine (PHENERGAN)  injection 25 mg (has no administration in time range)  HYDROmorphone (DILAUDID) injection 1 mg (has no administration in time range)  ketorolac (TORADOL) 30 MG/ML injection 30 mg (has no administration in time range)     Initial Impression / Assessment and Plan / ED Course  I have reviewed the triage vital signs and the nursing notes.  Pertinent labs & imaging results  that were available during my care of the patient were reviewed by me and considered in my medical decision making (see chart for details).    Patient with right-sided flank pain and known kidney stones.  Returns with nausea and vomiting and chills.  No fever. Imaging from earlier today showed 2 proximal large right-sided kidney stones.  Her urine was also positive for infection and culture was sent.  She was given antibiotics which she is not been able to take tonight.  Labs show worsening leukocytosis with slight elevation of creatinine.  Patient nausea has improved after IV fluids and antiemetics. She continues to be afebrile.   Urinalysis pending.  Culture sent from earlier.  Patient states she needs to leave because her ride home is sleepy and she wants to go home.  Discussed with patient that she would benefit from more IV fluids and IV antibiotics as she was not able to keep or medications down at home and continues to vomit here.  Discussed that if she is not able to tolerate medications by home she might need to be admitted to the hospital.  Patient states she is sleepy and wants to leave.  She appears to have capacity to make her own decisions and will leave AGAINST MEDICAL ADVICE.  She understands that she has 2 large kidney stones with urinary tract infection and persistent nausea and vomiting which may need IV antibiotics and possible surgical intervention for her kidney stone.  She states she will return if she gets worse.  Final Clinical Impressions(s) / ED Diagnoses   Final diagnoses:  Kidney stone  Ureteral  colic  Nausea and vomiting, intractability of vomiting not specified, unspecified vomiting type    ED Discharge Orders    None       Aryianna Earwood, Jeannett Senior, MD 02/19/18 [phone]

## 2018-02-18 NOTE — ED Triage Notes (Signed)
Patient c/o right flank pain that radiates into right lower abd. Per patient pain and nausea started this morning. Denies any vomiting, diarrhea, or fevers. Denies noting any blood in urine.  Patient does report frequency with oliguria. Hx of kidney stones.

## 2018-02-18 NOTE — Discharge Instructions (Signed)
Call [facility] today to schedule an appointment to be seen in the office next week.  Return to the emergency department if you develop fever, shaking chills, or if your pain or nausea is not well controlled with the medications prescribed

## 2018-02-19 ENCOUNTER — Emergency Department (HOSPITAL_COMMUNITY): Payer: Self-pay

## 2018-02-19 LAB — BASIC METABOLIC PANEL
ANION GAP: 10 (ref 5–15)
BUN: 16 mg/dL (ref 6–20)
CALCIUM: 9 mg/dL (ref 8.9–10.3)
CO2: 23 mmol/L (ref 22–32)
Chloride: 108 mmol/L (ref 101–111)
Creatinine, Ser: 1.16 mg/dL — ABNORMAL HIGH (ref 0.44–1.00)
GFR calc Af Amer: >60 mL/min (ref >60)
GFR, EST NON AFRICAN AMERICAN: 57 mL/min — AB (ref >60)
GLUCOSE: 129 mg/dL — AB (ref 65–99)
Potassium: 4.4 mmol/L (ref 3.5–5.1)
SODIUM: 141 mmol/L (ref 135–145)

## 2018-02-19 LAB — CBC WITH DIFFERENTIAL/PLATELET
BASOS ABS: 0 10*3/uL (ref 0.0–0.1)
Basophils Relative: 0 %
EOS ABS: 0 10*3/uL (ref 0.0–0.7)
EOS PCT: 0 %
HCT: 41.3 % (ref 36.0–46.0)
Hemoglobin: 13.6 g/dL (ref 12.0–15.0)
LYMPHS PCT: 7 %
Lymphs Abs: 1.2 10*3/uL (ref 0.7–4.0)
MCH: 30.3 pg (ref 26.0–34.0)
MCHC: 32.9 g/dL (ref 30.0–36.0)
MCV: 92 fL (ref 78.0–100.0)
MONO ABS: 1.1 10*3/uL — AB (ref 0.1–1.0)
Monocytes Relative: 6 %
Neutro Abs: 14.9 10*3/uL — ABNORMAL HIGH (ref 1.7–7.7)
Neutrophils Relative %: 87 %
PLATELETS: 228 10*3/uL (ref 150–400)
RBC: 4.49 MIL/uL (ref 3.87–5.11)
RDW: 12.9 % (ref 11.5–15.5)
WBC: 17.3 10*3/uL — AB (ref 4.0–10.5)

## 2018-02-19 LAB — URINALYSIS, ROUTINE W REFLEX MICROSCOPIC
Bilirubin Urine: NEGATIVE
Glucose, UA: NEGATIVE mg/dL
Ketones, ur: NEGATIVE mg/dL
Nitrite: NEGATIVE
PH: 7 (ref 5.0–8.0)
Protein, ur: NEGATIVE mg/dL
SPECIFIC GRAVITY, URINE: 1.024 (ref 1.005–1.030)

## 2018-02-19 LAB — URINE CULTURE
Culture: NO GROWTH
Special Requests: NORMAL

## 2018-02-19 MED ORDER — SODIUM CHLORIDE 0.9 % IV SOLN: 1.0000 g | Freq: Once | INTRAVENOUS | Status: DC

## 2018-02-19 MED ORDER — SODIUM CHLORIDE 0.9 % IV BOLUS (SEPSIS): 1000.0000 mL | Freq: Once | INTRAVENOUS | Status: DC

## 2018-02-19 NOTE — ED Notes (Signed)
This RN went into this pt's room to advise her that she would need iv antibiotics and another bag of fluid. Pt stated she had to leave because her ride was tired. EDP Rancour made aware.

## 2018-02-19 NOTE — ED Notes (Signed)
Pt continued to refuse to stay and signed out AMA.

## 2018-02-19 NOTE — ED Notes (Signed)
Pt refused rectal temp.

## 2018-02-19 NOTE — ED Notes (Signed)
Pt sleeping. 

## 2018-02-20 LAB — URINE CULTURE: Culture: NO GROWTH

## 2018-03-14 ENCOUNTER — Other Ambulatory Visit: Payer: Self-pay | Admitting: Urology

## 2018-04-11 NOTE — Patient Instructions (Addendum)
Mechille L Mino  04/11/2018   Your procedure is scheduled on: 04/15/2018 Friday  Report to [facility] Main  Entrance   Report to admitting at    0530 AM    Call this number if you have problems the morning of surgery [phone]    Remember: Do not eat food or drink liquids :After Midnight.     Take these medicines the morning of surgery with A SIP OF WATER: use Inhalers as usual and bring, Percocet if needed                                You may not have any metal on your body including hair pins and              piercings  Do not wear jewelry, make-up, lotions, powders or perfumes, deodorant             Do not wear nail polish.  Do not shave  48 hours prior to surgery.                 Do not bring valuables to the hospital. [facility] IS NOT             RESPONSIBLE   FOR VALUABLES.  Contacts, dentures or bridgework may not be worn into surgery.  Leave suitcase in the car. After surgery it may be brought to your room.                     Please read over the following fact sheets you were given: _____________________________________________________________________             [facility] - Preparing for Surgery Before surgery, you can play an important role.  Because skin is not sterile, your skin needs to be as free of germs as possible.  You can reduce the number of germs on your skin by washing with CHG (chlorahexidine gluconate) soap before surgery.  CHG is an antiseptic cleaner which kills germs and bonds with the skin to continue killing germs even after washing. Please DO NOT use if you have an allergy to CHG or antibacterial soaps.  If your skin becomes reddened/irritated stop using the CHG and inform your nurse when you arrive at Short Stay. Do not shave (including legs and underarms) for at least 48 hours prior to the first CHG shower.  You may shave your face/neck. Please follow these instructions carefully:  1.  Shower with CHG Soap  the night before surgery and the  morning of Surgery.  2.  If you choose to wash your hair, wash your hair first as usual with your  normal  shampoo.  3.  After you shampoo, rinse your hair and body thoroughly to remove the  shampoo.                           4.  Use CHG as you would any other liquid soap.  You can apply chg directly  to the skin and wash                       Gently with a scrungie or clean washcloth.  5.  Apply the CHG Soap to your body ONLY FROM THE NECK DOWN.   Do not use  on face/ open                           Wound or open sores. Avoid contact with eyes, ears mouth and genitals (private parts).                       Wash face,  Genitals (private parts) with your normal soap.             6.  Wash thoroughly, paying special attention to the area where your surgery  will be performed.  7.  Thoroughly rinse your body with warm water from the neck down.  8.  DO NOT shower/wash with your normal soap after using and rinsing off  the CHG Soap.                9.  Pat yourself dry with a clean towel.            10.  Wear clean pajamas.            11.  Place clean sheets on your bed the night of your first shower and do not  sleep with pets. Day of Surgery : Do not apply any lotions/deodorants the morning of surgery.  Please wear clean clothes to the hospital/surgery center.  FAILURE TO FOLLOW THESE INSTRUCTIONS MAY RESULT IN THE CANCELLATION OF YOUR SURGERY PATIENT SIGNATURE_________________________________  NURSE SIGNATURE__________________________________  ________________________________________________________________________

## 2018-04-12 ENCOUNTER — Encounter (HOSPITAL_COMMUNITY): Payer: Self-pay

## 2018-04-12 ENCOUNTER — Encounter (HOSPITAL_COMMUNITY)
Admission: RE | Admit: 2018-04-12 | Discharge: 2018-04-12 | Disposition: A | Discharge status: Home / Self Care | Payer: Self-pay | Source: Ambulatory Visit | Attending: Urology | Admitting: Urology

## 2018-04-12 ENCOUNTER — Other Ambulatory Visit: Payer: Self-pay

## 2018-04-12 DIAGNOSIS — Z01810 Encounter for preprocedural cardiovascular examination: Secondary | ICD-10-CM | POA: Insufficient documentation

## 2018-04-12 DIAGNOSIS — N200 Calculus of kidney: Secondary | ICD-10-CM | POA: Insufficient documentation

## 2018-04-12 DIAGNOSIS — Z01812 Encounter for preprocedural laboratory examination: Secondary | ICD-10-CM | POA: Insufficient documentation

## 2018-04-12 DIAGNOSIS — I10 Essential (primary) hypertension: Secondary | ICD-10-CM | POA: Insufficient documentation

## 2018-04-12 LAB — BASIC METABOLIC PANEL
Anion gap: 10 (ref 5–15)
BUN: 14 mg/dL (ref 6–20)
CHLORIDE: 107 mmol/L (ref 101–111)
CO2: 22 mmol/L (ref 22–32)
Calcium: 8.8 mg/dL — ABNORMAL LOW (ref 8.9–10.3)
Creatinine, Ser: 1.35 mg/dL — ABNORMAL HIGH (ref 0.44–1.00)
GFR calc Af Amer: 55 mL/min — ABNORMAL LOW (ref >60)
GFR, EST NON AFRICAN AMERICAN: 47 mL/min — AB (ref >60)
GLUCOSE: 89 mg/dL (ref 65–99)
POTASSIUM: 4.1 mmol/L (ref 3.5–5.1)
Sodium: 139 mmol/L (ref 135–145)

## 2018-04-12 LAB — CBC
HEMATOCRIT: 38.3 % (ref 36.0–46.0)
Hemoglobin: 12.6 g/dL (ref 12.0–15.0)
MCH: 29.8 pg (ref 26.0–34.0)
MCHC: 32.9 g/dL (ref 30.0–36.0)
MCV: 90.5 fL (ref 78.0–100.0)
Platelets: 342 10*3/uL (ref 150–400)
RBC: 4.23 MIL/uL (ref 3.87–5.11)
RDW: 13.1 % (ref 11.5–15.5)
WBC: 11.9 10*3/uL — ABNORMAL HIGH (ref 4.0–10.5)

## 2018-04-14 MED ORDER — CEFAZOLIN SODIUM 10 G IJ SOLR
3.0000 g | INTRAMUSCULAR | Status: AC
  Administered 2018-04-15 (×2): 3 g via INTRAVENOUS
  Filled 2018-04-14 (×2): qty 3

## 2018-04-14 NOTE — Anesthesia Preprocedure Evaluation (Addendum)
Anesthesia Evaluation  Patient identified by MRN, date of birth, ID band Patient awake    Reviewed: Allergy & Precautions, NPO status , Patient's Chart, lab work & pertinent test results  Airway Mallampati: II  TM Distance: >3 FB Neck ROM: Full    Dental  (+) Dental Advisory Given   Pulmonary asthma , COPD, Current Smoker,    breath sounds clear to auscultation       Cardiovascular hypertension,  Rhythm:Regular Rate:Normal     Neuro/Psych negative neurological ROS     GI/Hepatic negative GI ROS, Neg liver ROS,   Endo/Other  Morbid obesity  Renal/GU Renal disease     Musculoskeletal   Abdominal   Peds  Hematology negative hematology ROS (+)   Anesthesia Other Findings   Reproductive/Obstetrics                            Lab Results  Component Value Date   WBC 11.9 (H) 04/12/2018   HGB 12.6 04/12/2018   HCT 38.3 04/12/2018   MCV 90.5 04/12/2018   PLT 342 04/12/2018   Lab Results  Component Value Date   CREATININE 1.35 (H) 04/12/2018   BUN 14 04/12/2018   NA 139 04/12/2018   K 4.1 04/12/2018   CL 107 04/12/2018   CO2 22 04/12/2018    Anesthesia Physical Anesthesia Plan  ASA: III  Anesthesia Plan: General   Post-op Pain Management:    Induction: Intravenous  PONV Risk Score and Plan: 2 and Ondansetron, Dexamethasone and Treatment may vary due to age or medical condition  Airway Management Planned: Oral ETT  Additional Equipment:   Intra-op Plan:   Post-operative Plan: Extubation in OR  Informed Consent: I have reviewed the patients History and Physical, chart, labs and discussed the procedure including the risks, benefits and alternatives for the proposed anesthesia with the patient or authorized representative who has indicated his/her understanding and acceptance.   Dental advisory given  Plan Discussed with: CRNA  Anesthesia Plan Comments:         Anesthesia Quick Evaluation

## 2018-04-15 ENCOUNTER — Ambulatory Visit (HOSPITAL_COMMUNITY): Payer: Self-pay

## 2018-04-15 ENCOUNTER — Inpatient Hospital Stay (HOSPITAL_COMMUNITY)
Admission: RE | Admit: 2018-04-15 | Discharge: 2018-04-16 | DRG: 660 | Disposition: A | Discharge status: Home / Self Care | Payer: Self-pay | Source: Ambulatory Visit | Attending: Urology | Admitting: Urology

## 2018-04-15 ENCOUNTER — Ambulatory Visit (HOSPITAL_COMMUNITY): Payer: Self-pay | Admitting: Anesthesiology

## 2018-04-15 ENCOUNTER — Encounter (HOSPITAL_COMMUNITY)
Admission: RE | Disposition: A | Discharge status: Home / Self Care | Payer: Self-pay | Source: Ambulatory Visit | Attending: Urology

## 2018-04-15 ENCOUNTER — Other Ambulatory Visit: Payer: Self-pay

## 2018-04-15 ENCOUNTER — Encounter (HOSPITAL_COMMUNITY): Payer: Self-pay

## 2018-04-15 DIAGNOSIS — N202 Calculus of kidney with calculus of ureter: Principal | ICD-10-CM | POA: Diagnosis present

## 2018-04-15 DIAGNOSIS — Z79899 Other long term (current) drug therapy: Secondary | ICD-10-CM

## 2018-04-15 DIAGNOSIS — I10 Essential (primary) hypertension: Secondary | ICD-10-CM | POA: Diagnosis present

## 2018-04-15 DIAGNOSIS — F17210 Nicotine dependence, cigarettes, uncomplicated: Secondary | ICD-10-CM | POA: Diagnosis present

## 2018-04-15 DIAGNOSIS — Z7951 Long term (current) use of inhaled steroids: Secondary | ICD-10-CM

## 2018-04-15 DIAGNOSIS — Z6841 Body Mass Index (BMI) 40.0 and over, adult: Secondary | ICD-10-CM

## 2018-04-15 DIAGNOSIS — N200 Calculus of kidney: Secondary | ICD-10-CM

## 2018-04-15 DIAGNOSIS — J449 Chronic obstructive pulmonary disease, unspecified: Secondary | ICD-10-CM | POA: Diagnosis present

## 2018-04-15 HISTORY — PX: HOLMIUM LASER APPLICATION: SHX5852

## 2018-04-15 HISTORY — PX: NEPHROLITHOTOMY: SHX5134

## 2018-04-15 LAB — HEMOGLOBIN AND HEMATOCRIT, BLOOD
HCT: 35.9 % — ABNORMAL LOW (ref 36.0–46.0)
HEMOGLOBIN: 11.9 g/dL — AB (ref 12.0–15.0)

## 2018-04-15 LAB — BASIC METABOLIC PANEL
ANION GAP: 7 (ref 5–15)
BUN: 11 mg/dL (ref 6–20)
CALCIUM: 8.1 mg/dL — AB (ref 8.9–10.3)
CO2: 24 mmol/L (ref 22–32)
Chloride: 108 mmol/L (ref 101–111)
Creatinine, Ser: 1.03 mg/dL — ABNORMAL HIGH (ref 0.44–1.00)
GFR calc Af Amer: >60 mL/min (ref >60)
Glucose, Bld: 146 mg/dL — ABNORMAL HIGH (ref 65–99)
Potassium: 4.2 mmol/L (ref 3.5–5.1)
SODIUM: 139 mmol/L (ref 135–145)

## 2018-04-15 SURGERY — NEPHROLITHOTOMY PERCUTANEOUS
Anesthesia: General | Laterality: Right

## 2018-04-15 MED ORDER — MIDAZOLAM HCL 5 MG/5ML IJ SOLN
INTRAMUSCULAR | Status: DC | PRN
  Administered 2018-04-15: 2 mg via INTRAVENOUS

## 2018-04-15 MED ORDER — DOCUSATE SODIUM 100 MG PO CAPS
100.0000 mg | ORAL_CAPSULE | Freq: Two times a day (BID) | ORAL | Status: DC
  Administered 2018-04-15 – 2018-04-16 (×3): 100 mg via ORAL
  Filled 2018-04-15 (×3): qty 1

## 2018-04-15 MED ORDER — BUPIVACAINE-EPINEPHRINE (PF) 0.5% -1:200000 IJ SOLN
INTRAMUSCULAR | Status: AC
  Filled 2018-04-15: qty 30

## 2018-04-15 MED ORDER — ONDANSETRON HCL 4 MG/2ML IJ SOLN
INTRAMUSCULAR | Status: DC | PRN
  Administered 2018-04-15: 4 mg via INTRAVENOUS

## 2018-04-15 MED ORDER — FENTANYL CITRATE (PF) 100 MCG/2ML IJ SOLN
INTRAMUSCULAR | Status: DC | PRN
  Administered 2018-04-15: 100 ug via INTRAVENOUS
  Administered 2018-04-15 (×2): 50 ug via INTRAVENOUS
  Administered 2018-04-15: 100 ug via INTRAVENOUS
  Administered 2018-04-15 (×2): 50 ug via INTRAVENOUS

## 2018-04-15 MED ORDER — DEXAMETHASONE SODIUM PHOSPHATE 10 MG/ML IJ SOLN
INTRAMUSCULAR | Status: DC | PRN
  Administered 2018-04-15: 10 mg via INTRAVENOUS

## 2018-04-15 MED ORDER — ONDANSETRON HCL 4 MG/2ML IJ SOLN
INTRAMUSCULAR | Status: AC
Start: 2018-04-15 — End: 2018-04-16
  Filled 2018-04-15: qty 2

## 2018-04-15 MED ORDER — HYDROMORPHONE HCL 1 MG/ML IJ SOLN
INTRAMUSCULAR | Status: AC
  Filled 2018-04-15: qty 1

## 2018-04-15 MED ORDER — SODIUM CHLORIDE 0.9 % IR SOLN
Status: DC | PRN
  Administered 2018-04-15: 18000 mL

## 2018-04-15 MED ORDER — OXYCODONE HCL 5 MG PO CAPS
5.0000 mg | ORAL_CAPSULE | ORAL | 0 refills | Status: DC | PRN
Start: 2018-04-15 — End: 2018-12-19

## 2018-04-15 MED ORDER — HYDROMORPHONE HCL 1 MG/ML IJ SOLN
0.2500 mg | INTRAMUSCULAR | Status: DC | PRN
  Administered 2018-04-15: 0.5 mg via INTRAVENOUS
  Administered 2018-04-15 (×2): 0.25 mg via INTRAVENOUS

## 2018-04-15 MED ORDER — FENTANYL CITRATE (PF) 100 MCG/2ML IJ SOLN
INTRAMUSCULAR | Status: AC
  Filled 2018-04-15: qty 2

## 2018-04-15 MED ORDER — GENTAMICIN SULFATE 40 MG/ML IJ SOLN
5.0000 mg/kg | Freq: Once | INTRAVENOUS | Status: AC
  Administered 2018-04-15: 410 mg via INTRAVENOUS
  Filled 2018-04-15: qty 10.25

## 2018-04-15 MED ORDER — LIDOCAINE 2% (20 MG/ML) 5 ML SYRINGE
INTRAMUSCULAR | Status: DC | PRN
  Administered 2018-04-15: 100 mg via INTRAVENOUS

## 2018-04-15 MED ORDER — ROCURONIUM BROMIDE 10 MG/ML (PF) SYRINGE
PREFILLED_SYRINGE | INTRAVENOUS | Status: AC
Start: 2018-04-15 — End: ?
  Filled 2018-04-15: qty 5

## 2018-04-15 MED ORDER — LIDOCAINE 2% (20 MG/ML) 5 ML SYRINGE
INTRAMUSCULAR | Status: AC
  Filled 2018-04-15: qty 5

## 2018-04-15 MED ORDER — LACTATED RINGERS IV SOLN
INTRAVENOUS | Status: DC | PRN
  Administered 2018-04-15: 07:00:00 via INTRAVENOUS

## 2018-04-15 MED ORDER — SUCCINYLCHOLINE CHLORIDE 200 MG/10ML IV SOSY
PREFILLED_SYRINGE | INTRAVENOUS | Status: DC | PRN
  Administered 2018-04-15: 140 mg via INTRAVENOUS

## 2018-04-15 MED ORDER — SODIUM CHLORIDE 0.9 % IV SOLN
INTRAVENOUS | Status: DC
  Administered 2018-04-15: 16:00:00 via INTRAVENOUS

## 2018-04-15 MED ORDER — PROPOFOL 10 MG/ML IV BOLUS
INTRAVENOUS | Status: DC | PRN
  Administered 2018-04-15: 250 mg via INTRAVENOUS

## 2018-04-15 MED ORDER — SUGAMMADEX SODIUM 500 MG/5ML IV SOLN
INTRAVENOUS | Status: DC | PRN
  Administered 2018-04-15: 300 mg via INTRAVENOUS

## 2018-04-15 MED ORDER — DEXAMETHASONE SODIUM PHOSPHATE 10 MG/ML IJ SOLN
INTRAMUSCULAR | Status: AC
  Filled 2018-04-15: qty 1

## 2018-04-15 MED ORDER — ONDANSETRON HCL 4 MG/2ML IJ SOLN
INTRAMUSCULAR | Status: AC
  Filled 2018-04-15: qty 2

## 2018-04-15 MED ORDER — BUPIVACAINE-EPINEPHRINE 0.5% -1:200000 IJ SOLN
INTRAMUSCULAR | Status: DC | PRN
  Administered 2018-04-15: 30 mL

## 2018-04-15 MED ORDER — ACETAMINOPHEN 10 MG/ML IV SOLN
1000.0000 mg | Freq: Four times a day (QID) | INTRAVENOUS | Status: AC
  Administered 2018-04-15 – 2018-04-16 (×4): 1000 mg via INTRAVENOUS
  Filled 2018-04-15 (×5): qty 100

## 2018-04-15 MED ORDER — AMOXICILLIN-POT CLAVULANATE 875-125 MG PO TABS: 1.0000 | ORAL_TABLET | Freq: Two times a day (BID) | ORAL | 0 refills | Status: AC

## 2018-04-15 MED ORDER — LACTATED RINGERS IV SOLN
INTRAVENOUS | Status: DC | PRN
  Administered 2018-04-15 (×2): via INTRAVENOUS

## 2018-04-15 MED ORDER — ROCURONIUM BROMIDE 10 MG/ML (PF) SYRINGE
PREFILLED_SYRINGE | INTRAVENOUS | Status: DC | PRN
  Administered 2018-04-15 (×2): 10 mg via INTRAVENOUS
  Administered 2018-04-15: 50 mg via INTRAVENOUS
  Administered 2018-04-15: 10 mg via INTRAVENOUS
  Administered 2018-04-15: 20 mg via INTRAVENOUS

## 2018-04-15 MED ORDER — IOHEXOL 300 MG/ML  SOLN
INTRAMUSCULAR | Status: DC | PRN
  Administered 2018-04-15: 200 mL

## 2018-04-15 MED ORDER — GENTAMICIN SULFATE 40 MG/ML IJ SOLN
5.0000 mg/kg | INTRAVENOUS | Status: DC
  Administered 2018-04-16: 410 mg via INTRAVENOUS
  Filled 2018-04-15: qty 10.25

## 2018-04-15 MED ORDER — SUCCINYLCHOLINE CHLORIDE 200 MG/10ML IV SOSY
PREFILLED_SYRINGE | INTRAVENOUS | Status: AC
  Filled 2018-04-15: qty 10

## 2018-04-15 MED ORDER — ONDANSETRON HCL 4 MG/2ML IJ SOLN: 4.0000 mg | INTRAMUSCULAR | Status: DC | PRN

## 2018-04-15 MED ORDER — GENTAMICIN SULFATE 40 MG/ML IJ SOLN: 2.0000 mg/kg | Freq: Once | INTRAMUSCULAR | Status: DC

## 2018-04-15 MED ORDER — PROPOFOL 10 MG/ML IV BOLUS
INTRAVENOUS | Status: AC
Start: 2018-04-15 — End: ?
  Filled 2018-04-15: qty 40

## 2018-04-15 MED ORDER — MIDAZOLAM HCL 2 MG/2ML IJ SOLN
INTRAMUSCULAR | Status: AC
  Filled 2018-04-15: qty 2

## 2018-04-15 MED ORDER — ONDANSETRON HCL 4 MG/2ML IJ SOLN
4.0000 mg | Freq: Once | INTRAMUSCULAR | Status: AC | PRN
  Administered 2018-04-15: 4 mg via INTRAVENOUS

## 2018-04-15 MED ORDER — TRAMADOL HCL 50 MG PO TABS
50.0000 mg | ORAL_TABLET | Freq: Four times a day (QID) | ORAL | Status: DC | PRN
  Administered 2018-04-15 – 2018-04-16 (×3): 50 mg via ORAL
  Filled 2018-04-15 (×3): qty 1

## 2018-04-15 MED ORDER — PROMETHAZINE HCL 25 MG/ML IJ SOLN
INTRAMUSCULAR | Status: AC
  Filled 2018-04-15: qty 1

## 2018-04-15 SURGICAL SUPPLY — 53 items
BAG URINE DRAINAGE (UROLOGICAL SUPPLIES) ×3 IMPLANT
BASKET STONE NCOMPASS (UROLOGICAL SUPPLIES) IMPLANT
BASKET ZERO TIP NITINOL 2.4FR (BASKET) ×3 IMPLANT
BENZOIN TINCTURE PRP APPL 2/3 (GAUZE/BANDAGES/DRESSINGS) ×3 IMPLANT
BLADE SURG 15 STRL LF DISP TIS (BLADE) ×2 IMPLANT
BLADE SURG 15 STRL SS (BLADE) ×1
CATH AINSWORTH 30CC 24FR (CATHETERS) ×3 IMPLANT
CATH FOLEY 2WAY SLVR  5CC 16FR (CATHETERS) ×1
CATH FOLEY 2WAY SLVR 5CC 16FR (CATHETERS) ×2 IMPLANT
CATH IMAGER II 65CM (CATHETERS) ×3 IMPLANT
CATH URET 5FR 28IN OPEN ENDED (CATHETERS) ×3 IMPLANT
CATH URET DUAL LUMEN 6-10FR 50 (CATHETERS) ×3 IMPLANT
CATH X-FORCE N30 NEPHROSTOMY (TUBING) ×3 IMPLANT
CHLORAPREP W/TINT 26ML (MISCELLANEOUS) ×3 IMPLANT
COVER SURGICAL LIGHT HANDLE (MISCELLANEOUS) ×3 IMPLANT
DRAPE C-ARM 42X120 X-RAY (DRAPES) ×3 IMPLANT
DRAPE LINGEMAN PERC (DRAPES) ×3 IMPLANT
DRSG PAD ABDOMINAL 8X10 ST (GAUZE/BANDAGES/DRESSINGS) IMPLANT
DRSG TEGADERM 4X4.75 (GAUZE/BANDAGES/DRESSINGS) ×3 IMPLANT
DRSG TEGADERM 8X12 (GAUZE/BANDAGES/DRESSINGS) IMPLANT
FIBER LASER FLEXIVA 1000 (UROLOGICAL SUPPLIES) IMPLANT
FIBER LASER FLEXIVA 365 (UROLOGICAL SUPPLIES) IMPLANT
FIBER LASER FLEXIVA 550 (UROLOGICAL SUPPLIES) IMPLANT
FIBER LASER TRAC TIP (UROLOGICAL SUPPLIES) ×3 IMPLANT
GAUZE SPONGE 4X4 12PLY STRL (GAUZE/BANDAGES/DRESSINGS) ×3 IMPLANT
GLOVE BIOGEL M STRL SZ7.5 (GLOVE) ×6 IMPLANT
GOWN STRL REUS W/TWL XL LVL3 (GOWN DISPOSABLE) ×6 IMPLANT
GUIDEWIRE AMPLAZ .035X145 (WIRE) ×6 IMPLANT
GUIDEWIRE ANG ZIPWIRE 038X150 (WIRE) ×3 IMPLANT
GUIDEWIRE STR DUAL SENSOR (WIRE) ×3 IMPLANT
HOLDER NEEDLE AMPLATZ W/INSERT (MISCELLANEOUS) IMPLANT
IV SET EXTENSION CATH 6 NF (IV SETS) ×3 IMPLANT
KIT BASIN OR (CUSTOM PROCEDURE TRAY) ×3 IMPLANT
KIT PROBE TRILOGY 3.9X350 (MISCELLANEOUS) ×3 IMPLANT
MANIFOLD NEPTUNE II (INSTRUMENTS) ×3 IMPLANT
NEEDLE SPNL 20GX3.5 QUINCKE YW (NEEDLE) ×3 IMPLANT
NEEDLE TROCAR 18X15 ECHO (NEEDLE) IMPLANT
NEEDLE TROCAR 18X20 (NEEDLE) ×3 IMPLANT
NS IRRIG 1000ML POUR BTL (IV SOLUTION) ×3 IMPLANT
PACK CYSTO (CUSTOM PROCEDURE TRAY) ×3 IMPLANT
PROBE LITHOCLAST ULTRA 3.8X403 (UROLOGICAL SUPPLIES) IMPLANT
PROBE PNEUMATIC 1.0MMX570MM (UROLOGICAL SUPPLIES) IMPLANT
SHEATH ACCESS URETERAL 24CM (SHEATH) ×3 IMPLANT
SHEATH PEELAWAY SET 9 (SHEATH) IMPLANT
SPONGE LAP 4X18 RFD (DISPOSABLE) ×3 IMPLANT
STENT CONTOUR 7FRX24 (STENTS) ×3 IMPLANT
STONE CATCHER W/TUBE ADAPTER (UROLOGICAL SUPPLIES) ×3 IMPLANT
SUT ETHILON 2 0 PS N (SUTURE) ×3 IMPLANT
SYR 10ML LL (SYRINGE) ×3 IMPLANT
SYR 20CC LL (SYRINGE) ×6 IMPLANT
SYR 50ML LL SCALE MARK (SYRINGE) ×3 IMPLANT
TOWEL OR 17X26 10 PK STRL BLUE (TOWEL DISPOSABLE) ×3 IMPLANT
TUBING CONNECTING 10 (TUBING) ×6 IMPLANT

## 2018-04-15 NOTE — Anesthesia Postprocedure Evaluation (Signed)
Anesthesia Post Note  Patient: [name]  Procedure(s) Performed: RIGHT NEPHROLITHOTOMY PERCUTANEOUS WITH ACCESS (Right ) HOLMIUM LASER APPLICATION (N/A )     Patient location during evaluation: PACU Anesthesia Type: General Level of consciousness: awake and alert Pain management: pain level controlled Vital Signs Assessment: post-procedure vital signs reviewed and stable Respiratory status: spontaneous breathing, nonlabored ventilation, respiratory function stable and patient connected to nasal cannula oxygen Cardiovascular status: blood pressure returned to baseline and stable Postop Assessment: no apparent nausea or vomiting Anesthetic complications: no    Last Vitals:  Vitals:   04/15/18 1319 04/15/18 1334  BP: 138/82 (!) 146/97  Pulse: 83 89  Resp: 14 16  Temp: (!) 36.3 C 36.6 C  SpO2: 97% 97%    Last Pain:  Vitals:   04/15/18 1319  TempSrc:   PainSc: 0-No pain                 Kennieth Rad

## 2018-04-15 NOTE — Anesthesia Procedure Notes (Signed)
Procedure Name: Intubation Date/Time: 04/15/2018 7:35 AM Performed by: Lind Covert, CRNA Pre-anesthesia Checklist: Patient identified, Emergency Drugs available, Suction available, Patient being monitored and Timeout performed Patient Re-evaluated:Patient Re-evaluated prior to induction Oxygen Delivery Method: Circle system utilized Preoxygenation: Pre-oxygenation with 100% oxygen Induction Type: IV induction Laryngoscope Size: Mac and 4 Grade View: Grade I Tube type: Oral Tube size: 7.5 mm Number of attempts: 1 Airway Equipment and Method: Stylet Placement Confirmation: ETT inserted through vocal cords under direct vision,  positive ETCO2 and breath sounds checked- equal and bilateral Secured at: 22 cm Tube secured with: Tape Dental Injury: Teeth and Oropharynx as per pre-operative assessment

## 2018-04-15 NOTE — Op Note (Addendum)
Preoperative Diagnosis:   Right renal stone greater than 2 cm   Postoperative Diagnosis:   Right renal stone greater than 2 cm   Procedure(s) Performed:   1. Right percutaneous nephrostolithotomy for stone burden greater than 2 cm 2. Right percutaneous renal access to establish nephrostomy tract 3. Cystoscopy with Right ureteral catheterization and retrograde pyelogram 4. Right ureteroscopy with laser lithotripsy 4. Antegrade nephrostogram with nephrostomy tube placement 5. Simple Foley catheter placement 6. Intraoperative fluoroscopy with interpretation less than 1 hour   Teaching Surgeon: Berniece Salines  , M.D.   Resident Surgeon:  Sydnee Levans, MD   Assistants:  None listed   Anesthesia:  General via endotracheal tube.     IV Fluids:  See Anesthesia record.   Estimated Blood Loss:  100 mL's.   Cultures:  Stone for culture   Drains: 85fr x 24cm JJ ureteral stent without dangler   Specimens:  Right renal stone for chemical analysis    Complications:  None.   Indications for Surgery:  [name] is a 42 y.o. female with a history of nephrolithiasis.  The patient was evaluated and noted with multiple right stones with a total stone diameter of > 2 cm.  The patient presents today for percutaneous treatment of Right kidney stone. The risks and benefits of the procedure were discussed with the patient who wishes to proceed.   Operative Findings:    - Successful percutaneous access was obtained to the posterior interpolar pole calix  - All stone was treated, no residual stone noted on pyeloscopy - Successful placement of 81fr x 24cm JJ stent, antegrade  - PCN removed at end of case - tubeless. Draining urine from PCN, prior to removal, was clear  Radiologic Interpretation of Retrograde Pyelogram and Anterograde Nephrostogram: - Retrograde pyelogram demonstrated dilated right collecting system with large filling defect in lower pole.  - Completion Antegrade  nephrostogram post-treatment demonstrated no contrast extravasation.    Procedure:  The patient was correctly identified in the preoperative holding area where written informed consent as well as potential risks and complications were reviewed. The patient was brought back to the operative suite where a preinduction timeout was performed. Once correct information was verified, general anesthesia was induced via endotracheal tube.  The patient was then gently repositioned into the prone split leg position, paying careful attention to pad all pressure points and affixed the patient to the bed at multiple points of contact.  She was then prepped and draped in the usual sterile fashion and given appropriate perioperative procedural antibiotics.  Sequential compression devices were placed for VTE prophylaxis.  A second timeout was then performed.   At the beginning of the case, flexible cystoscope was performed per urethra with copious lubrication and normal saline irrigation running.  The urethra and bladder appeared grossly normal.  Turning our attention to the Right ureteral orifice, we gently cannulated the orifice with a sensor wire. The sensor wire would not advance past the distal ureter. We switched to flexible ureteroscopy and found an impacted stone in the distal right ureter. The stone was laser lithotripsied until we could see the proximal ureter. At that point a sensor wire was passed into the renal pelvis.   The ureteroscope was removed and a 5 [place] open-ended catheter was advanced over the wire into the renal pelvis confirmed by fluoroscopy.  The wire was removed.  A 16 French Foley catheter was placed per urethra with return of urine with 10 mL's of water in  the balloon.   Next, we performed a retrograde pyelogram, which demonstrated findings  as above.  We elected to obtain interpolar pole access and did so using an 18-gauge diamond-tipped Chiba needle using a bullseye technique and fluoroscopic  guidance.  After placement of the access needle, a Glidewire was advanced into the kidney and manipulated down the patient's ureter with the assistance of the Kumpe catheter. This wire was subsequently switched to a superstiff wire. Next, using a combination of catheters and dilators, we placed a second safety wire and then developed our percutaneous tract with the advancement of a 30 French x 20 cm Nephromax balloon dilator.  After this, a 30 French sheath was advanced to the edge of the distal calyx on fluoroscopy.     We then performed rigid nephroscopy with the lithotrite. Immediately upon entrance into the collecting system, we encountered the stone and we then proceeded to treat the stone with lithotripsy. After reaching the limit of the stone capable of being treated with the rigid scope, we then switched to flexible nephroscopy and removed additional stone using the laser and basket. We then switched to flexible ureteroscopy and visualized the entire ureter to the bladder in an antegrade fashion. There were numerous small stones in the distal ureter, all of which were removed by basket extraction. This was assisted by the use of a 12/14 x 26 ureteral access sheath. llAt the conclusion of the procedure, we repeated flexible nephroscopy in the kidney as well and withdrew our sheath over our rigid nephroscope to the edge of renal parenchyma which did not  reveal any residual stone fragments.   At this point, we elected to leave a stent. Using the nephroscope, we back loaded a wire and passed a 85fr x24cm JJ ureteral stent antegrade, under visual and fluoroscopic guidance. A curl was noted in the renal pelvis and bladder. We passed a 24 French [place] tip nephrostomy tube into the renal pelvis. The sheath was removed. A completion antegrade nephrostogram demonstrated complete filling of the entire renal collecting system with minimal contrast extravasation from the access tract and satisfactory placement of  her nephrostomy tube in the renal pelvis. The tube was draining clear urine therefore we opted to remove it and perform a tubeless procedure. The nephrostomy tube was removed. No bleeding was encountered. Vertical mattress nylon sutures closed the flank incision.   We then administered 10 mL's of 1% Lidocaine solution locally around the flank incision.   The site was then dressed in the usual gauze and tape dressing and the patient was carefully returned to supine position.  At this point, the patient was extubated and taken to the recovery area in stable fashion.

## 2018-04-15 NOTE — H&P (Addendum)
Urology H+P Note   Admitting Attending: Marlou Porch  Chief Complaint:  Right Nephrolithiasis  HPI: [name] is a 42 y.o. female with hx of COPD, HTN and Nephrolithiasis.   She was seen in the ED on 02/18/18 for flank pain.  CT renal stone showed mutltiple right renal calculi. Total stone volume > 2 cm  She was seen in clinic and options discussed, she wished to proceed with PCNL    Past Medical History: Past Medical History:  Diagnosis Date  . Asthma   . COPD (chronic obstructive pulmonary disease) (HCC)   . Hypertension   . Kidney stones     Past Surgical History:  Past Surgical History:  Procedure Laterality Date  . ABDOMINAL HYSTERECTOMY    . CHOLECYSTECTOMY    . CYSTOSCOPY W/ RETROGRADES  08/04/2011   Procedure: CYSTOSCOPY WITH RETROGRADE PYELOGRAM;  Surgeon: Ky Barban;  Location: [facility];  Service: Urology;  Laterality: Right;  Right Retrograde  . LITHOTRIPSY    . PERCUTANEOUS NEPHROLITHOTRIPSY  2012   both sides kidney   . TUBAL LIGATION      Medication: Current Facility-Administered Medications  Medication Dose Route Frequency Provider Last Rate Last Dose  . ceFAZolin (ANCEF) 3 g in dextrose 5 % 50 mL IVPB  3 g Intravenous 30 min Pre-Op Crist Fat, MD       Facility-Administered Medications Ordered in Other Encounters  Medication Dose Route Frequency Provider Last Rate Last Dose  . lactated ringers infusion    Continuous PRN Doran Clay, CRNA        Allergies: Allergies  Allergen Reactions  . Hydrocodone Nausea And Vomiting  . Morphine And Related Hives    Welps  . Vicodin [Hydrocodone-Acetaminophen] Nausea And Vomiting    Social History: Social History   Tobacco Use  . Smoking status: Current Every Day Smoker    Packs/day: 0.50  . Smokeless tobacco: Never Used  Substance Use Topics  . Alcohol use: No  . Drug use: No    Family History Family History  Problem Relation Age of Onset  . COPD Mother   . COPD Father   .  Heart attack Father     Review of Systems 10 systems were reviewed and are negative except as noted specifically in the HPI.  Objective   Vital signs in last 24 hours: BP 139/86   Pulse 88   Temp 98.1 F (36.7 C) (Oral)   Resp 18   Ht  (1.651 m)   Wt 121.5 kg (267 lb 12.8 oz)   LMP 11/05/2015   SpO2 97%   BMI 44.56 kg/m   Physical Exam General: NAD, A&O, resting, appropriate HEENT: [place]/AT, EOMI, MMM Pulmonary: Normal work of breathing Cardiovascular: HDS, adequate peripheral perfusion Abdomen: Soft, NTTP, nondistended, . GU: , No right CVA tenderness Extremities: warm and well perfused Neuro: Appropriate, no focal neurological deficits  Most Recent Labs: Lab Results  Component Value Date   WBC 11.9 (H) 04/12/2018   HGB 12.6 04/12/2018   HCT 38.3 04/12/2018   PLT 342 04/12/2018    Lab Results  Component Value Date   NA 139 04/12/2018   K 4.1 04/12/2018   CL 107 04/12/2018   CO2 22 04/12/2018   BUN 14 04/12/2018   CREATININE 1.35 (H) 04/12/2018   CALCIUM 8.8 (L) 04/12/2018   MG 1.7 08/14/2008    Lab Results  Component Value Date   APTT 29 06/01/2007     IMAGING: No results found.  ------  Assessment:  42 y.o. female with > 2 cm right stone burden. Options for treatment discussed. She has opted for right PCNL w/ access. Risk (bleeding, infection, damage to associated structures, residual stone) discussed in detail.    Plan: - NPO - Ancef OCTOR - Proceed with R PCNL w/ access  - Consent signed and reviewed  - Right side marked

## 2018-04-15 NOTE — Transfer of Care (Signed)
Immediate Anesthesia Transfer of Care Note  Patient: [name]  Procedure(s) Performed: RIGHT NEPHROLITHOTOMY PERCUTANEOUS WITH ACCESS (Right ) HOLMIUM LASER APPLICATION (N/A )  Patient Location: PACU  Anesthesia Type:General  Level of Consciousness: sedated  Airway & Oxygen Therapy: Patient Spontanous Breathing and Patient connected to face mask oxygen  Post-op Assessment: Report given to RN and Post -op Vital signs reviewed and stable  Post vital signs: Reviewed and stable  Last Vitals:  Vitals Value Taken Time  BP    Temp    Pulse    Resp 20 04/15/2018 11:46 AM  SpO2    Vitals shown include unvalidated device data.  Last Pain:  Vitals:   04/15/18 0642  TempSrc:   PainSc: 7       Patients Stated Pain Goal: 4 (04/15/18 5409)  Complications: No apparent anesthesia complications

## 2018-04-16 ENCOUNTER — Inpatient Hospital Stay (HOSPITAL_COMMUNITY): Payer: Self-pay

## 2018-04-16 LAB — BASIC METABOLIC PANEL
ANION GAP: 9 (ref 5–15)
BUN: 13 mg/dL (ref 6–20)
CALCIUM: 8.5 mg/dL — AB (ref 8.9–10.3)
CO2: 23 mmol/L (ref 22–32)
Chloride: 107 mmol/L (ref 101–111)
Creatinine, Ser: 1.05 mg/dL — ABNORMAL HIGH (ref 0.44–1.00)
Glucose, Bld: 138 mg/dL — ABNORMAL HIGH (ref 65–99)
POTASSIUM: 4.2 mmol/L (ref 3.5–5.1)
Sodium: 139 mmol/L (ref 135–145)

## 2018-04-16 LAB — CBC
HEMATOCRIT: 32.5 % — AB (ref 36.0–46.0)
HEMOGLOBIN: 10.6 g/dL — AB (ref 12.0–15.0)
MCH: 29.9 pg (ref 26.0–34.0)
MCHC: 32.6 g/dL (ref 30.0–36.0)
MCV: 91.5 fL (ref 78.0–100.0)
Platelets: 301 10*3/uL (ref 150–400)
RBC: 3.55 MIL/uL — AB (ref 3.87–5.11)
RDW: 13.2 % (ref 11.5–15.5)
WBC: 19.6 10*3/uL — ABNORMAL HIGH (ref 4.0–10.5)

## 2018-04-16 LAB — HIV ANTIBODY (ROUTINE TESTING W REFLEX): HIV Screen 4th Generation wRfx: NONREACTIVE

## 2018-04-16 NOTE — Discharge Summary (Signed)
Date of admission: 04/15/2018  Date of discharge: 04/16/2018  Admission diagnosis: right renal and ureteral stones  Discharge diagnosis: same  Secondary diagnoses:  Patient Active Problem List   Diagnosis Date Noted  . Stone, kidney 04/15/2018  . Asthma, moderate persistent 11/12/2015  . Leukocytosis 09/25/2011  . Anxiety 09/25/2011  . Acute exacerbation of COPD with asthma ([place]) 09/24/2011  . Acute bronchitis 09/23/2011  . Aspiration of gastric contents 08/04/2011  . COPD (chronic obstructive pulmonary disease) ([place]) 08/04/2011  . Kidney stones 08/04/2011    Procedures performed: Procedure(s): RIGHT NEPHROLITHOTOMY PERCUTANEOUS WITH ACCESS HOLMIUM LASER APPLICATION  History and Physical: For full details, please see admission history and physical. Briefly, [name] is a 42 y.o. year old patient with obstructing right ureteral stones and large right renal stones.   Hospital Course: Patient tolerated the procedure well.  She was then transferred to the floor after an uneventful PACU stay.  Her hospital course was uncomplicated.  On POD#1 she had met discharge criteria: was eating a regular diet, was up and ambulating independently,  pain was well controlled, was voiding without a catheter, and was ready to for discharge.  CT post-op showed a small 26m stone in the right lower pole, but no other stones or abnormalities of the GU tract.  Plan to remove stent in office in 2 weeks.  F/u has been requested.   Laboratory values:  Recent Labs    04/15/18 1414 04/16/18 0509  WBC  --  19.6*  HGB 11.9* 10.6*  HCT 35.9* 32.5*   Recent Labs    04/15/18 1414 04/16/18 0509  NA 139 139  K 4.2 4.2  CL 108 107  CO2 24 23  GLUCOSE 146* 138*  BUN 11 13  CREATININE 1.03* 1.05*  CALCIUM 8.1* 8.5*   No results for input(s): LABPT, INR in the last 72 hours. No results for input(s): LABURIN in the last 72 hours. Results for orders placed or performed during the hospital  encounter of 02/18/18  Urine Culture     Status: None   Collection Time: 02/19/18  2:42 AM  Result Value Ref Range Status   Specimen Description   Final    URINE, CLEAN CATCH [facility] [address] [place] [address]   Special Requests   Final    NONE [facility]formed at [facility] [address] [place] [address]   Culture   Final    NO GROWTH [facility] at [facility] [id number], [place] [address]   Report Status 02/20/2018 FINAL  Final    Disposition: Home  Discharge instruction: The patient was instructed to be ambulatory but told to refrain from heavy lifting, strenuous activity, or driving.   Discharge medications:  Allergies as of 04/16/2018      Reactions   Hydrocodone Nausea And Vomiting   Morphine And Related Hives   Welps   Vicodin [hydrocodone-acetaminophen] Nausea And Vomiting      Medication List    TAKE these medications   albuterol 108 (90 Base) MCG/ACT inhaler Commonly known as:  PROVENTIL HFA;VENTOLIN HFA Inhale 2 puffs into the lungs every 6 (six) hours as needed. For asthma   amoxicillin-clavulanate 875-125 MG tablet Commonly known as:  AUGMENTIN Take 1 tablet by mouth 2 (two) times daily for 5 days.   BACTRIM DS 800-160 MG tablet Generic drug:  sulfamethoxazole-trimethoprim Take 1 tablet by mouth See admin instructions. Take 1 week before procedure  twice daily   metoCLOPramide 10 MG tablet Commonly known as:  REGLAN Take 1 tablet (10 mg total) by mouth every 6 (six) hours as needed for nausea (nausea/headache).   oxycodone 5 MG capsule Commonly known as:  OXY-IR Take 1-2 capsules (5-10 mg total) by mouth every 4 (four) hours as needed.   oxyCODONE-acetaminophen 5-325 MG tablet Commonly known as:  PERCOCET Take 1-2 tablets by mouth every 6 (six) hours as needed for severe pain.       Followup:  Follow-up Information    Alla Feeling, MD On 04/28/2018.   Specialty:  General  Surgery Why:  Stent removal Contact information: [place] [address] [phone]

## 2018-04-16 NOTE — Care Management Note (Signed)
Case Management Note  Patient Details  Name: [name] MRN: [id number] Date of Birth: October 15, 1976  Subjective/Objective:   right renal and ureteral stones                   Action/Plan: Pt reported follow up at [facility]. She will call to arrange appt.   Expected Discharge Date:  04/16/18               Expected Discharge Plan:  Home/Self Care  In-House Referral:  NA  Discharge planning Services  CM Consult, [facility]igent [facility]  Post Acute Care Choice:  NA Choice offered to:  NA  DME Arranged:  N/A DME Agency:  NA  HH Arranged:  NA HH Agency:  NA  Status of Service:  Completed, signed off  If discussed at Long Length of Stay Meetings, dates discussed:    Additional Comments:  Elliot Cousin, RN 04/16/2018, 1:54 PM

## 2018-04-16 NOTE — Discharge Instructions (Signed)
Discharge instructions following PCNL  Call your doctor for: Fevers greater than 100.5 Severe nausea or vomiting Increasing pain not controlled by pain medication Increasing redness or drainage from incisions Decreased urine output or a catheter is no longer draining  The number for questions is [phone].  Activity: Gradually increase activity with short frequent walks, 3-4 times a day.  Avoid strenuous activities, like sports, lawn-mowing, or heavy lifting (more than 10-15 pounds).  Wear loose, comfortable clothing that pull or kink the tube or tubes.  Do not drive while taking pain medication, or until your doctor permitts it.  Bathing and dressing changes: You should not shower for 48 hours after surgery.  Do not soak your back in a bathtub.  Diet: It is extremely important to drink plenty of fluids after surgery, especially water.  You may resume your regular diet, unless otherwise instructed.  Medications: May take Tylenol (acetaminophen) or ibuprofen (Advil, Motrin) as directed over-the-counter. Take any prescriptions as directed.  Follow-up appointments: Follow-up appointment will be scheduled with Dr. Al Corpus in 10-14 days for hospital check and stent removal.

## 2018-04-16 NOTE — Progress Notes (Signed)
Urology Progress Note   1 Day Post-Op  Subjective: NAEON.   Objective: Vital signs in last 24 hours: Temp:  [97.4 F (36.3 C)-98 F (36.7 C)] 98 F (36.7 C) (05/18 0521) Pulse Rate:  [71-102] 81 (05/18 0521) Resp:  [14-23] 17 (05/18 0521) BP: (131-166)/(68-98) 157/88 (05/18 0521) SpO2:  [96 %-100 %] 99 % (05/18 0521)  Intake/Output from previous day: 05/17 0701 - 05/18 0700 In: 4335 [P.O.:120; I.V.:3815; IV Piggyback:400] Out: 2650 [Urine:2400; Emesis/NG output:250] Intake/Output this shift: No intake/output data recorded.  Physical Exam:  General: Alert and oriented CV: RRR Lungs: Clear Abdomen: Soft, appropriately tender. Flank incision bandaged GU: Foley in place draining clear yellow urine  Ext: NT, No erythema  Lab Results: Recent Labs    04/15/18 1414 04/16/18 0509  HGB 11.9* 10.6*  HCT 35.9* 32.5*   BMET Recent Labs    04/15/18 1414 04/16/18 0509  NA 139 139  K 4.2 4.2  CL 108 107  CO2 24 23  GLUCOSE 146* 138*  BUN 11 13  CREATININE 1.03* 1.05*  CALCIUM 8.1* 8.5*     Studies/Results: Dg C-arm 1-60 Min-no Report  Result Date: 04/15/2018 Fluoroscopy was utilized by the requesting physician.  No radiographic interpretation.    Assessment/Plan:  42 y.o. female s/p tubeless R PCNL w/ stent.  Overall doing well post-op.   - Regular diet - medlock  - DC foley  - CT this morning to evaluate stone burden - If doing well, will send home this afternoon   Dispo: likely today   LOS: 1 day   Sydnee Levans, MD 04/16/2018, 7:50 AM

## 2018-04-16 NOTE — Progress Notes (Signed)
Reviewed discharge information with patient and boyfriend. Answered all questions. Patient/BF able to teach back medications and reasons to contact MD/911. Patient verbalizes importance of PCP follow up appointment and has resources for [employer].  Earnest Conroy. Clelia Croft, RN

## 2018-04-19 LAB — STONE ANALYSIS
CA OXALATE, MONOHYDR.: 45 %
CA PHOS CRY STONE QL IR: 20 %
Ca Hydrogen Phos.: 25 %
Ca Oxalate,Dihydrate: 10 %
Stone Weight KSTONE: 110.4 mg

## 2018-08-26 ENCOUNTER — Other Ambulatory Visit: Payer: Self-pay

## 2018-08-26 ENCOUNTER — Encounter (HOSPITAL_COMMUNITY): Payer: Self-pay | Admitting: Emergency Medicine

## 2018-08-26 ENCOUNTER — Emergency Department (HOSPITAL_COMMUNITY): Payer: Self-pay

## 2018-08-26 ENCOUNTER — Emergency Department (HOSPITAL_COMMUNITY)
Admission: EM | Admit: 2018-08-26 | Discharge: 2018-08-26 | Disposition: A | Discharge status: Home / Self Care | Payer: Self-pay | Attending: Emergency Medicine | Admitting: Emergency Medicine

## 2018-08-26 DIAGNOSIS — N200 Calculus of kidney: Secondary | ICD-10-CM | POA: Insufficient documentation

## 2018-08-26 DIAGNOSIS — J449 Chronic obstructive pulmonary disease, unspecified: Secondary | ICD-10-CM | POA: Insufficient documentation

## 2018-08-26 DIAGNOSIS — F17200 Nicotine dependence, unspecified, uncomplicated: Secondary | ICD-10-CM | POA: Insufficient documentation

## 2018-08-26 LAB — BASIC METABOLIC PANEL
Anion gap: 7 (ref 5–15)
BUN: 10 mg/dL (ref 6–20)
CALCIUM: 8.3 mg/dL — AB (ref 8.9–10.3)
CHLORIDE: 106 mmol/L (ref 98–111)
CO2: 24 mmol/L (ref 22–32)
Creatinine, Ser: 0.77 mg/dL (ref 0.44–1.00)
GFR calc Af Amer: >60 mL/min (ref >60)
Glucose, Bld: 93 mg/dL (ref 70–99)
Potassium: 3.8 mmol/L (ref 3.5–5.1)
SODIUM: 137 mmol/L (ref 135–145)

## 2018-08-26 LAB — URINALYSIS, ROUTINE W REFLEX MICROSCOPIC
Bacteria, UA: NONE SEEN
Bilirubin Urine: NEGATIVE
GLUCOSE, UA: NEGATIVE mg/dL
KETONES UR: NEGATIVE mg/dL
Leukocytes, UA: NEGATIVE
NITRITE: NEGATIVE
PH: 5 (ref 5.0–8.0)
Protein, ur: NEGATIVE mg/dL
Specific Gravity, Urine: 1.019 (ref 1.005–1.030)

## 2018-08-26 LAB — CBC
HEMATOCRIT: 41 % (ref 36.0–46.0)
HEMOGLOBIN: 13.6 g/dL (ref 12.0–15.0)
MCH: 30.7 pg (ref 26.0–34.0)
MCHC: 33.2 g/dL (ref 30.0–36.0)
MCV: 92.6 fL (ref 78.0–100.0)
Platelets: 249 10*3/uL (ref 150–400)
RBC: 4.43 MIL/uL (ref 3.87–5.11)
RDW: 13.1 % (ref 11.5–15.5)
WBC: 9.7 10*3/uL (ref 4.0–10.5)

## 2018-08-26 MED ORDER — KETOROLAC TROMETHAMINE 30 MG/ML IJ SOLN
30.0000 mg | Freq: Once | INTRAMUSCULAR | Status: AC
  Administered 2018-08-26: 30 mg via INTRAVENOUS
  Filled 2018-08-26: qty 1

## 2018-08-26 MED ORDER — FENTANYL CITRATE (PF) 100 MCG/2ML IJ SOLN
50.0000 ug | Freq: Once | INTRAMUSCULAR | Status: AC
Start: 2018-08-26 — End: 2018-08-26
  Administered 2018-08-26: 50 ug via INTRAVENOUS
  Filled 2018-08-26: qty 2

## 2018-08-26 MED ORDER — ONDANSETRON HCL 4 MG/2ML IJ SOLN
4.0000 mg | Freq: Once | INTRAMUSCULAR | Status: AC
  Administered 2018-08-26: 4 mg via INTRAVENOUS
  Filled 2018-08-26: qty 2

## 2018-08-26 MED ORDER — TAMSULOSIN HCL 0.4 MG PO CAPS: 0.4000 mg | ORAL_CAPSULE | Freq: Every day | ORAL | 0 refills | Status: DC

## 2018-08-26 MED ORDER — FENTANYL CITRATE (PF) 100 MCG/2ML IJ SOLN
50.0000 ug | Freq: Once | INTRAMUSCULAR | Status: AC
  Administered 2018-08-26: 50 ug via INTRAVENOUS
  Filled 2018-08-26: qty 2

## 2018-08-26 MED ORDER — OXYCODONE-ACETAMINOPHEN 5-325 MG PO TABS: 2.0000 | ORAL_TABLET | ORAL | 0 refills | Status: DC | PRN

## 2018-08-26 MED ORDER — PROMETHAZINE HCL 25 MG PO TABS: 25.0000 mg | ORAL_TABLET | Freq: Four times a day (QID) | ORAL | 0 refills | Status: DC | PRN

## 2018-08-26 NOTE — ED Provider Notes (Signed)
[facility] EMERGENCY DEPARTMENT Provider Note   CSN: [id number] Arrival date & time: 08/26/18  0845     History   Chief Complaint Chief Complaint  Patient presents with  . Flank Pain    HPI [name] is a 42 y.o. female.  The history is provided by the patient and medical records. No language interpreter was used.  Flank Pain      42 year old female with history of kidney stones, anxiety, presenting complaining of flank pain.  Patient report acute onset of right flank pain approximately 2 hours ago while she was at work.  She is unable to describe the pain but states that it is intense, nonradiating with associated nausea.  Nothing seems to make it better or worse.  Pain felt very similar to prior kidney stones which she had stenting and was hospitalized for.  She denies any associated fever or chills, no lightheadedness or dizziness, has not noticed any blood in the urine no vaginal bleeding or vaginal discharge.  She denies any recent strenuous activities or injury.  No specific treatment tried prior to arrival.  Past Medical History:  Diagnosis Date  . Asthma   . COPD (chronic obstructive pulmonary disease) (HCC)   . Hypertension   . Kidney stones     Patient Active Problem List   Diagnosis Date Noted  . Stone, kidney 04/15/2018  . Asthma, moderate persistent 11/12/2015  . Leukocytosis 09/25/2011  . Anxiety 09/25/2011  . Acute exacerbation of COPD with asthma (HCC) 09/24/2011  . Acute bronchitis 09/23/2011  . Aspiration of gastric contents 08/04/2011  . COPD (chronic obstructive pulmonary disease) (HCC) 08/04/2011  . Kidney stones 08/04/2011    Past Surgical History:  Procedure Laterality Date  . ABDOMINAL HYSTERECTOMY    . CHOLECYSTECTOMY    . CYSTOSCOPY W/ RETROGRADES  08/04/2011   Procedure: CYSTOSCOPY WITH RETROGRADE PYELOGRAM;  Surgeon: Ky Barban;  Location: [facility];  Service: Urology;  Laterality: Right;  Right Retrograde  . HOLMIUM LASER  APPLICATION N/A 04/15/2018   Procedure: HOLMIUM LASER APPLICATION;  Surgeon: Crist Fat, MD;  Location: [facility];  Service: Urology;  Laterality: N/A;  . LITHOTRIPSY    . NEPHROLITHOTOMY Right 04/15/2018   Procedure: RIGHT NEPHROLITHOTOMY PERCUTANEOUS WITH ACCESS;  Surgeon: Crist Fat, MD;  Location: [facility];  Service: Urology;  Laterality: Right;  . PERCUTANEOUS NEPHROLITHOTRIPSY  2012   both sides kidney   . TUBAL LIGATION       OB History    Gravida  3   Para  3   Term  3   Preterm      AB      Living  3     SAB      TAB      Ectopic      Multiple      Live Births               Home Medications    Prior to Admission medications   Medication Sig Start Date End Date Taking? Authorizing Provider  albuterol (PROVENTIL HFA;VENTOLIN HFA) 108 (90 Base) MCG/ACT inhaler Inhale 2 puffs into the lungs every 6 (six) hours as needed. For asthma 01/17/16   Elenora Gamma, MD  metoCLOPramide (REGLAN) 10 MG tablet Take 1 tablet (10 mg total) by mouth every 6 (six) hours as needed for nausea (nausea/headache). 02/18/18   Doug Sou, MD  oxycodone (OXY-IR) 5 MG capsule Take 1-2 capsules (5-10 mg total) by mouth every 4 (  four) hours as needed. 04/15/18   Narang, Holley Dexter, MD  oxyCODONE-acetaminophen (PERCOCET) 5-325 MG tablet Take 1-2 tablets by mouth every 6 (six) hours as needed for severe pain. 02/18/18   Doug Sou, MD  sulfamethoxazole-trimethoprim (BACTRIM DS) 800-160 MG tablet Take 1 tablet by mouth See admin instructions. Take 1 week before procedure twice daily    [provider]    Family History Family History  Problem Relation Age of Onset  . COPD Mother   . COPD Father   . Heart attack Father     Social History Social History   Tobacco Use  . Smoking status: Current Every Day Smoker    Packs/day: 0.50  . Smokeless tobacco: Never Used  Substance Use Topics  . Alcohol use: No  . Drug use: No     Allergies     Hydrocodone; Morphine and related; and Vicodin [hydrocodone-acetaminophen]   Review of Systems Review of Systems  Genitourinary: Positive for flank pain.  All other systems reviewed and are negative.    Physical Exam Updated Vital Signs BP 129/78 (BP Location: Left Wrist)   Pulse 64   Temp 97.8 F (36.6 C) (Oral)   Resp 17   Ht 5\' 6"  (1.676 m)   Wt 117.9 kg   LMP 11/05/2015   SpO2 98%   BMI 41.97 kg/m   Physical Exam  Constitutional: She appears well-developed and well-nourished. No distress.  Obese female appears uncomfortable  HENT:  Head: Atraumatic.  Eyes: Conjunctivae are normal.  Neck: Neck supple.  Cardiovascular: Normal rate and regular rhythm.  Pulmonary/Chest: Effort normal and breath sounds normal.  Abdominal: Soft. Bowel sounds are normal. There is tenderness (Tenderness to right flank on percussion.).  Neurological: She is alert.  Skin: No rash noted.  Psychiatric: She has a normal mood and affect.  Nursing note and vitals reviewed.    ED Treatments / Results  Labs (all labs ordered are listed, but only abnormal results are displayed) Labs Reviewed  URINALYSIS, ROUTINE W REFLEX MICROSCOPIC - Abnormal; Notable for the following components:      Result Value   APPearance CLOUDY (*)    Hgb urine dipstick SMALL (*)    All other components within normal limits  BASIC METABOLIC PANEL - Abnormal; Notable for the following components:   Calcium 8.3 (*)    All other components within normal limits  CBC    EKG None  Radiology US Renal  Result Date: 08/26/2018 CLINICAL DATA:  Right flank pain for 1 hour EXAM: RENAL / URINARY TRACT ULTRASOUND COMPLETE COMPARISON:  04/16/2018 CT FINDINGS: Right Kidney: Length: 13.0 cm. Severe right hydronephrosis. 12 mm stone noted in the lower pole of the right kidney. Normal echotexture. No focal renal mass. Left Kidney: Length: 12.5 cm. Echogenicity within normal limits. No mass or hydronephrosis visualized. Bladder:  Appears normal for degree of bladder distention. IMPRESSION: Severe right hydronephrosis Right lower pole nephrolithiasis. Electronically Signed   By: Charlett Nose M.D.   On: 08/26/2018 10:12    Procedures Procedures (including critical care time)  Medications Ordered in ED Medications  ketorolac (TORADOL) 30 MG/ML injection 30 mg (30 mg Intravenous Given 08/26/18 0930)  ondansetron (ZOFRAN) injection 4 mg (4 mg Intravenous Given 08/26/18 0930)  fentaNYL (SUBLIMAZE) injection 50 mcg (50 mcg Intravenous Given 08/26/18 1006)  fentaNYL (SUBLIMAZE) injection 50 mcg (50 mcg Intravenous Given 08/26/18 1127)     Initial Impression / Assessment and Plan / ED Course  I have reviewed the triage vital  signs and the nursing notes.  Pertinent labs & imaging results that were available during my care of the patient were reviewed by me and considered in my medical decision making (see chart for details).     BP 129/78 (BP Location: Left Wrist)   Pulse 64   Temp 97.8 F (36.6 C) (Oral)   Resp 17   Ht 5\' 6"  (1.676 m)   Wt 117.9 kg   LMP 11/05/2015   SpO2 98%   BMI 41.97 kg/m    Final Clinical Impressions(s) / ED Diagnoses   Final diagnoses:  Kidney stone on right side    ED Discharge Orders         Ordered    oxyCODONE-acetaminophen (PERCOCET) 5-325 MG tablet  Every 4 hours PRN     08/26/18 1157    promethazine (PHENERGAN) 25 MG tablet  Every 6 hours PRN     08/26/18 1157    tamsulosin (FLOMAX) 0.4 MG CAPS capsule  Daily     08/26/18 1157         9:30 AM Patient with history of kidney stone here with right flank pain.  Previous CT scan shows a small 1 mm nonobstructive kidney stone in the right kidney after post stenting.  I suspect her current pain is related to kidney stone pain.  Will provide pain medication, antinausea medication, and will obtain renal ultrasound for further evaluation.  UA ordered.  11:09 AM UA with cloudy appearance and small amount of hemoglobin on urine  dipstick but no evidence of infection.  Labs are reassuring, normal renal function.  Renal ultrasound obtained showing severe right hydronephrosis as well as a right lower pole nephrolithiasis measuring approximately 12 mm.  11:15 AM Appreciate consultation from Urologist Dr. Alvester Morin who recommend pt f/u with [facility] next week for further care.  He recommend if sxs worsen over the weekend then patient should go to [facility] ER for further management.  In order to decrease risk of narcotic abuse. Pt's record were checked using the [place] Controlled Substance database.     Fayrene Helper, PA-C 08/26/18 1200    Donnetta Hutching, MD 08/27/18 [phone]

## 2018-08-26 NOTE — Discharge Instructions (Signed)
You have been diagnosed with having a 12mm kidney stone on the right side.  Please call and follow up with urologist next week for further care.  If your condition worsen this weekend, please go to [facility] ER for further care.

## 2018-08-26 NOTE — ED Triage Notes (Signed)
Rt flank pain x 1 hour.  Hx of kidney stones

## 2018-12-12 ENCOUNTER — Other Ambulatory Visit: Payer: Self-pay | Admitting: Urology

## 2018-12-23 ENCOUNTER — Encounter (HOSPITAL_COMMUNITY): Payer: Self-pay

## 2018-12-23 NOTE — Patient Instructions (Signed)
[name]  12/23/2018   Your procedure is scheduled on: 12-29-18  Report to [facility] Main  Entrance   Report to admitting at                            0530   AM    Call this number if you have problems the morning of surgery [phone]    Remember: Do not eat food or drink liquids :After Midnight.  BRUSH YOUR TEETH MORNING OF SURGERY AND RINSE YOUR MOUTH OUT, NO CHEWING GUM CANDY OR MINTS.     Take these medicines the morning of surgery with A SIP OF WATER: inhaler and bring with you                                You may not have any metal on your body including hair pins and              piercings  Do not wear jewelry, make-up, lotions, powders or perfumes, deodorant             Do not wear nail polish.  Do not shave  48 hours prior to surgery.         Do not bring valuables to the hospital. [facility] IS NOT             RESPONSIBLE   FOR VALUABLES.  Contacts, dentures or bridgework may not be worn into surgery.  Leave suitcase in the car. After surgery it may be brought to your room.               Please read over the following fact sheets you were given: _____________________________________________________________________             [facility] - Preparing for Surgery Before surgery, you can play an important role.  Because skin is not sterile, your skin needs to be as free of germs as possible.  You can reduce the number of germs on your skin by washing with CHG (chlorahexidine gluconate) soap before surgery.  CHG is an antiseptic cleaner which kills germs and bonds with the skin to continue killing germs even after washing. Please DO NOT use if you have an allergy to CHG or antibacterial soaps.  If your skin becomes reddened/irritated stop using the CHG and inform your nurse when you arrive at Short Stay. Do not shave (including legs and underarms) for at least 48 hours prior to the first CHG shower.  You may shave your  face/neck. Please follow these instructions carefully:  1.  Shower with CHG Soap the night before surgery and the  morning of Surgery.  2.  If you choose to wash your hair, wash your hair first as usual with your  normal  shampoo.  3.  After you shampoo, rinse your hair and body thoroughly to remove the  shampoo.                           4.  Use CHG as you would any other liquid soap.  You can apply chg directly  to the skin and wash                       Gently with  a scrungie or clean washcloth.  5.  Apply the CHG Soap to your body ONLY FROM THE NECK DOWN.   Do not use on face/ open                           Wound or open sores. Avoid contact with eyes, ears mouth and genitals (private parts).                       Wash face,  Genitals (private parts) with your normal soap.             6.  Wash thoroughly, paying special attention to the area where your surgery  will be performed.  7.  Thoroughly rinse your body with warm water from the neck down.  8.  DO NOT shower/wash with your normal soap after using and rinsing off  the CHG Soap.                9.  Pat yourself dry with a clean towel.            10.  Wear clean pajamas.            11.  Place clean sheets on your bed the night of your first shower and do not  sleep with pets. Day of Surgery : Do not apply any lotions/deodorants the morning of surgery.  Please wear clean clothes to the hospital/surgery center.  FAILURE TO FOLLOW THESE INSTRUCTIONS MAY RESULT IN THE CANCELLATION OF YOUR SURGERY PATIENT SIGNATURE_________________________________  NURSE SIGNATURE__________________________________  ________________________________________________________________________

## 2018-12-23 NOTE — Progress Notes (Signed)
ekg 04-12-18 epic

## 2018-12-27 ENCOUNTER — Other Ambulatory Visit: Payer: Self-pay

## 2018-12-27 ENCOUNTER — Encounter (HOSPITAL_COMMUNITY): Payer: Self-pay

## 2018-12-27 ENCOUNTER — Encounter (HOSPITAL_COMMUNITY)
Admission: RE | Admit: 2018-12-27 | Discharge: 2018-12-27 | Disposition: A | Discharge status: Home / Self Care | Payer: Self-pay | Source: Ambulatory Visit | Attending: Urology | Admitting: Urology

## 2018-12-27 DIAGNOSIS — Z01812 Encounter for preprocedural laboratory examination: Secondary | ICD-10-CM | POA: Insufficient documentation

## 2018-12-27 DIAGNOSIS — N200 Calculus of kidney: Secondary | ICD-10-CM | POA: Insufficient documentation

## 2018-12-27 HISTORY — DX: Pneumonia, unspecified organism: J18.9

## 2018-12-27 HISTORY — DX: Depression, unspecified: F32.A

## 2018-12-27 HISTORY — DX: Other specified postprocedural states: Z98.890

## 2018-12-27 HISTORY — DX: Personal history of urinary calculi: Z87.442

## 2018-12-27 HISTORY — DX: Major depressive disorder, single episode, unspecified: F32.9

## 2018-12-27 HISTORY — DX: Gastro-esophageal reflux disease without esophagitis: K21.9

## 2018-12-27 HISTORY — DX: Nausea with vomiting, unspecified: R11.2

## 2018-12-27 LAB — BASIC METABOLIC PANEL
Anion gap: 8 (ref 5–15)
BUN: 15 mg/dL (ref 6–20)
CHLORIDE: 106 mmol/L (ref 98–111)
CO2: 24 mmol/L (ref 22–32)
CREATININE: 0.92 mg/dL (ref 0.44–1.00)
Calcium: 8.8 mg/dL — ABNORMAL LOW (ref 8.9–10.3)
GFR calc Af Amer: >60 mL/min (ref >60)
GFR calc non Af Amer: >60 mL/min (ref >60)
Glucose, Bld: 93 mg/dL (ref 70–99)
Potassium: 3.8 mmol/L (ref 3.5–5.1)
Sodium: 138 mmol/L (ref 135–145)

## 2018-12-27 LAB — CBC
HCT: 45.4 % (ref 36.0–46.0)
Hemoglobin: 14.3 g/dL (ref 12.0–15.0)
MCH: 30.6 pg (ref 26.0–34.0)
MCHC: 31.5 g/dL (ref 30.0–36.0)
MCV: 97 fL (ref 80.0–100.0)
Platelets: 242 10*3/uL (ref 150–400)
RBC: 4.68 MIL/uL (ref 3.87–5.11)
RDW: 14 % (ref 11.5–15.5)
WBC: 14.5 10*3/uL — ABNORMAL HIGH (ref 4.0–10.5)
nRBC: 0 % (ref 0.0–0.2)

## 2018-12-27 LAB — ABO/RH: ABO/RH(D): O POS

## 2018-12-27 NOTE — Progress Notes (Signed)
PCP:  None per pt.  CARDIOLOGIST: none  INFO IN Epic:  Wbc 14.5   INFO ON CHART:   Hx copd (mild per pt.)  BLOOD THINNERS AND LAST DOSES: NONE ____________________________________  PATIENT SYMPTOMS AT TIME OF PREOP:   Recent double ear infection finished z pack on 12-25-18 and prednisone. Also urine culture pending

## 2018-12-28 LAB — URINE CULTURE: Culture: NO GROWTH

## 2018-12-28 MED ORDER — DEXTROSE 5 % IV SOLN
3.0000 g | INTRAVENOUS | Status: AC
  Administered 2018-12-29: 3 g via INTRAVENOUS
  Filled 2018-12-28: qty 3

## 2018-12-28 NOTE — H&P (Signed)
Urology History and Physical  Admitting Attending: Berniece Salines, MD  Chief Complaint: nephrolithiasis  HPI: [name] is a 43 y.o. female with history of nephrolithiasis s/p R PCNL 03/2018 who was recently evaluated for right flank pain, found to have ~2cm right lower pole stone burden.  Today, patient denies fevers/chills, nausea/vomiting, hematuria, dysuria or concern for UTI.   Past Medical History: Past Medical History:  Diagnosis Date  . Asthma   . COPD (chronic obstructive pulmonary disease) (HCC)    sees PCP  . Depression   . GERD (gastroesophageal reflux disease)   . History of kidney stones   . Hypertension    pt. denies  never taken meds  . Pneumonia   . PONV (postoperative nausea and vomiting)     Past Surgical History:  Past Surgical History:  Procedure Laterality Date  . ABDOMINAL HYSTERECTOMY    . CHOLECYSTECTOMY    . CYSTOSCOPY W/ RETROGRADES  08/04/2011   Procedure: CYSTOSCOPY WITH RETROGRADE PYELOGRAM;  Surgeon: Ky Barban;  Location: [facility];  Service: Urology;  Laterality: Right;  Right Retrograde  . HOLMIUM LASER APPLICATION N/A 04/15/2018   Procedure: HOLMIUM LASER APPLICATION;  Surgeon: Crist Fat, MD;  Location: [facility];  Service: Urology;  Laterality: N/A;  . LITHOTRIPSY    . NEPHROLITHOTOMY Right 04/15/2018   Procedure: RIGHT NEPHROLITHOTOMY PERCUTANEOUS WITH ACCESS;  Surgeon: Crist Fat, MD;  Location: [facility];  Service: Urology;  Laterality: Right;  . PERCUTANEOUS NEPHROLITHOTRIPSY  2012   both sides kidney   . TUBAL LIGATION      Medication: Current Facility-Administered Medications  Medication Dose Route Frequency Provider Last Rate Last Dose  . [START ON 12/29/2018] ceFAZolin (ANCEF) 3 g in dextrose 5 % 50 mL IVPB  3 g Intravenous 30 min Pre-Op Crist Fat, MD       Current Outpatient Medications  Medication Sig Dispense Refill  . albuterol (PROVENTIL HFA;VENTOLIN HFA) 108 (90 Base) MCG/ACT inhaler  Inhale 2 puffs into the lungs every 6 (six) hours as needed. For asthma (Patient taking differently: Inhale 2 puffs into the lungs every 6 (six) hours as needed for wheezing or shortness of breath. ) 2 Inhaler 0  . HYDROcodone-homatropine (HYDROMET) 5-1.5 MG/5ML syrup Take 10 mLs by mouth every 6 (six) hours as needed for cough.     . predniSONE (DELTASONE) 20 MG tablet Take 40 mg by mouth daily with breakfast.      Allergies: Allergies  Allergen Reactions  . Hydrocodone Nausea And Vomiting  . Morphine And Related Hives and Other (See Comments)    [place]  . Vicodin [Hydrocodone-Acetaminophen] Nausea And Vomiting    Social History: Social History   Tobacco Use  . Smoking status: Current Every Day Smoker    Packs/day: 0.50  . Smokeless tobacco: Never Used  Substance Use Topics  . Alcohol use: No  . Drug use: No    Family History Family History  Problem Relation Age of Onset  . COPD Mother   . COPD Father   . Heart attack Father     Review of Systems 10 systems were reviewed and are negative except as noted specifically in the HPI.  Objective   Vital signs in last 24 hours: LMP 11/05/2015   Physical Exam General: NAD, A&O, resting, appropriate HEENT: [place]/AT, EOMI, MMM Pulmonary: Normal work of breathing Cardiovascular: HDS, adequate peripheral perfusion Abdomen: Soft, NTTP, nondistended Extremities: warm and well perfused Neuro: Appropriate, no focal neurological deficits  Most Recent Labs:  Lab Results  Component Value Date   WBC 14.5 (H) 12/27/2018   HGB 14.3 12/27/2018   HCT 45.4 12/27/2018   PLT 242 12/27/2018    Lab Results  Component Value Date   NA 138 12/27/2018   K 3.8 12/27/2018   CL 106 12/27/2018   CO2 24 12/27/2018   BUN 15 12/27/2018   CREATININE 0.92 12/27/2018   CALCIUM 8.8 (L) 12/27/2018   MG 1.7 08/14/2008    Lab Results  Component Value Date   APTT 29 06/01/2007     IMAGING: No results found.  ------  Assessment:  43  y.o. female with history of nephrolithiasis who presents today for right PCNL with access. After risks and benefits of the procedure were discussed in detail, she agrees to proceed with the procedure.   Plan: - OR as planned for right PCNL with access

## 2018-12-29 ENCOUNTER — Ambulatory Visit (HOSPITAL_COMMUNITY): Payer: Self-pay | Admitting: Certified Registered"

## 2018-12-29 ENCOUNTER — Observation Stay (HOSPITAL_COMMUNITY): Payer: Self-pay

## 2018-12-29 ENCOUNTER — Observation Stay (HOSPITAL_COMMUNITY)
Admission: RE | Admit: 2018-12-29 | Discharge: 2018-12-30 | Disposition: A | Discharge status: Home / Self Care | Payer: Self-pay | Attending: Urology | Admitting: Urology

## 2018-12-29 ENCOUNTER — Encounter (HOSPITAL_COMMUNITY)
Admission: RE | Disposition: A | Discharge status: Home / Self Care | Payer: Self-pay | Source: Home / Self Care | Attending: Urology

## 2018-12-29 ENCOUNTER — Other Ambulatory Visit: Payer: Self-pay

## 2018-12-29 ENCOUNTER — Ambulatory Visit (HOSPITAL_COMMUNITY): Payer: Self-pay | Admitting: Physician Assistant

## 2018-12-29 ENCOUNTER — Encounter (HOSPITAL_COMMUNITY): Payer: Self-pay | Admitting: *Deleted

## 2018-12-29 DIAGNOSIS — Z825 Family history of asthma and other chronic lower respiratory diseases: Secondary | ICD-10-CM | POA: Insufficient documentation

## 2018-12-29 DIAGNOSIS — Z23 Encounter for immunization: Secondary | ICD-10-CM | POA: Insufficient documentation

## 2018-12-29 DIAGNOSIS — Z9049 Acquired absence of other specified parts of digestive tract: Secondary | ICD-10-CM | POA: Insufficient documentation

## 2018-12-29 DIAGNOSIS — N200 Calculus of kidney: Principal | ICD-10-CM | POA: Insufficient documentation

## 2018-12-29 DIAGNOSIS — K219 Gastro-esophageal reflux disease without esophagitis: Secondary | ICD-10-CM | POA: Insufficient documentation

## 2018-12-29 DIAGNOSIS — J449 Chronic obstructive pulmonary disease, unspecified: Secondary | ICD-10-CM | POA: Insufficient documentation

## 2018-12-29 DIAGNOSIS — F17210 Nicotine dependence, cigarettes, uncomplicated: Secondary | ICD-10-CM | POA: Insufficient documentation

## 2018-12-29 DIAGNOSIS — Z885 Allergy status to narcotic agent status: Secondary | ICD-10-CM | POA: Insufficient documentation

## 2018-12-29 DIAGNOSIS — Z8249 Family history of ischemic heart disease and other diseases of the circulatory system: Secondary | ICD-10-CM | POA: Insufficient documentation

## 2018-12-29 DIAGNOSIS — I10 Essential (primary) hypertension: Secondary | ICD-10-CM | POA: Insufficient documentation

## 2018-12-29 DIAGNOSIS — Z79899 Other long term (current) drug therapy: Secondary | ICD-10-CM | POA: Insufficient documentation

## 2018-12-29 DIAGNOSIS — Z90710 Acquired absence of both cervix and uterus: Secondary | ICD-10-CM | POA: Insufficient documentation

## 2018-12-29 HISTORY — PX: NEPHROLITHOTOMY: SHX5134

## 2018-12-29 LAB — BASIC METABOLIC PANEL
ANION GAP: 7 (ref 5–15)
BUN: 11 mg/dL (ref 6–20)
CO2: 23 mmol/L (ref 22–32)
Calcium: 8.1 mg/dL — ABNORMAL LOW (ref 8.9–10.3)
Chloride: 106 mmol/L (ref 98–111)
Creatinine, Ser: 1 mg/dL (ref 0.44–1.00)
GFR calc Af Amer: >60 mL/min (ref >60)
GFR calc non Af Amer: >60 mL/min (ref >60)
Glucose, Bld: 148 mg/dL — ABNORMAL HIGH (ref 70–99)
Potassium: 4.3 mmol/L (ref 3.5–5.1)
Sodium: 136 mmol/L (ref 135–145)

## 2018-12-29 LAB — TYPE AND SCREEN
ABO/RH(D): O POS
Antibody Screen: NEGATIVE

## 2018-12-29 LAB — HEMOGLOBIN AND HEMATOCRIT, BLOOD
HCT: 44.5 % (ref 36.0–46.0)
Hemoglobin: 13.5 g/dL (ref 12.0–15.0)

## 2018-12-29 SURGERY — NEPHROLITHOTOMY PERCUTANEOUS
Anesthesia: General | Laterality: Right

## 2018-12-29 MED ORDER — OXYBUTYNIN CHLORIDE 5 MG PO TABS: 5.0000 mg | ORAL_TABLET | Freq: Three times a day (TID) | ORAL | Status: DC | PRN

## 2018-12-29 MED ORDER — CEFAZOLIN SODIUM-DEXTROSE 1-4 GM/50ML-% IV SOLN
1.0000 g | Freq: Three times a day (TID) | INTRAVENOUS | Status: AC
  Administered 2018-12-29 – 2018-12-30 (×3): 1 g via INTRAVENOUS
  Filled 2018-12-29 (×3): qty 50

## 2018-12-29 MED ORDER — ONDANSETRON HCL 4 MG/2ML IJ SOLN
INTRAMUSCULAR | Status: DC | PRN
  Administered 2018-12-29: 4 mg via INTRAVENOUS

## 2018-12-29 MED ORDER — FENTANYL CITRATE (PF) 100 MCG/2ML IJ SOLN: 25.0000 ug | INTRAMUSCULAR | Status: DC | PRN

## 2018-12-29 MED ORDER — FENTANYL CITRATE (PF) 100 MCG/2ML IJ SOLN
INTRAMUSCULAR | Status: DC | PRN
  Administered 2018-12-29: 50 ug via INTRAVENOUS
  Administered 2018-12-29: 100 ug via INTRAVENOUS
  Administered 2018-12-29 (×2): 50 ug via INTRAVENOUS

## 2018-12-29 MED ORDER — ACETAMINOPHEN 500 MG PO TABS: 1000.0000 mg | ORAL_TABLET | Freq: Four times a day (QID) | ORAL | Status: DC | PRN

## 2018-12-29 MED ORDER — INFLUENZA VAC SPLIT QUAD 0.5 ML IM SUSY
0.5000 mL | PREFILLED_SYRINGE | INTRAMUSCULAR | Status: AC
  Administered 2018-12-30: 0.5 mL via INTRAMUSCULAR
  Filled 2018-12-29: qty 0.5

## 2018-12-29 MED ORDER — SODIUM CHLORIDE 0.9 % IR SOLN
Status: DC | PRN
  Administered 2018-12-29: 18000 mL

## 2018-12-29 MED ORDER — ROCURONIUM BROMIDE 100 MG/10ML IV SOLN
INTRAVENOUS | Status: DC | PRN
  Administered 2018-12-29: 70 mg via INTRAVENOUS
  Administered 2018-12-29 (×2): 10 mg via INTRAVENOUS

## 2018-12-29 MED ORDER — DEXTROSE-NACL 5-0.45 % IV SOLN
INTRAVENOUS | Status: DC
  Administered 2018-12-29 – 2018-12-30 (×4): via INTRAVENOUS

## 2018-12-29 MED ORDER — HYDROMORPHONE HCL 1 MG/ML IJ SOLN
0.2500 mg | INTRAMUSCULAR | Status: DC | PRN
  Administered 2018-12-29: 0.5 mg via INTRAVENOUS

## 2018-12-29 MED ORDER — BUPIVACAINE-EPINEPHRINE 0.5% -1:200000 IJ SOLN
INTRAMUSCULAR | Status: DC | PRN
  Administered 2018-12-29: 20 mL

## 2018-12-29 MED ORDER — BUPIVACAINE-EPINEPHRINE (PF) 0.5% -1:200000 IJ SOLN
INTRAMUSCULAR | Status: AC
  Filled 2018-12-29: qty 30

## 2018-12-29 MED ORDER — MIDAZOLAM HCL 5 MG/5ML IJ SOLN
INTRAMUSCULAR | Status: DC | PRN
  Administered 2018-12-29: 2 mg via INTRAVENOUS

## 2018-12-29 MED ORDER — PROPOFOL 10 MG/ML IV BOLUS
INTRAVENOUS | Status: DC | PRN
  Administered 2018-12-29: 50 mg via INTRAVENOUS
  Administered 2018-12-29: 100 mg via INTRAVENOUS
  Administered 2018-12-29: 200 mg via INTRAVENOUS

## 2018-12-29 MED ORDER — DOCUSATE SODIUM 100 MG PO CAPS
100.0000 mg | ORAL_CAPSULE | Freq: Two times a day (BID) | ORAL | Status: DC
  Administered 2018-12-29 – 2018-12-30 (×2): 100 mg via ORAL
  Filled 2018-12-29 (×2): qty 1

## 2018-12-29 MED ORDER — PROPOFOL 500 MG/50ML IV EMUL
INTRAVENOUS | Status: DC | PRN
  Administered 2018-12-29: 15 ug/kg/min via INTRAVENOUS

## 2018-12-29 MED ORDER — MIDAZOLAM HCL 2 MG/2ML IJ SOLN
INTRAMUSCULAR | Status: AC
  Filled 2018-12-29: qty 2

## 2018-12-29 MED ORDER — 0.9 % SODIUM CHLORIDE (POUR BTL) OPTIME
TOPICAL | Status: DC | PRN
  Administered 2018-12-29: 5000 mL

## 2018-12-29 MED ORDER — LACTATED RINGERS IV SOLN
INTRAVENOUS | Status: DC
  Administered 2018-12-29 (×2): via INTRAVENOUS

## 2018-12-29 MED ORDER — SUGAMMADEX SODIUM 200 MG/2ML IV SOLN
INTRAVENOUS | Status: DC | PRN
  Administered 2018-12-29: 300 mg via INTRAVENOUS

## 2018-12-29 MED ORDER — FENTANYL CITRATE (PF) 250 MCG/5ML IJ SOLN
INTRAMUSCULAR | Status: AC
  Filled 2018-12-29: qty 5

## 2018-12-29 MED ORDER — ACETAMINOPHEN 10 MG/ML IV SOLN: 1000.0000 mg | Freq: Once | INTRAVENOUS | Status: DC | PRN

## 2018-12-29 MED ORDER — DIPHENHYDRAMINE HCL 50 MG/ML IJ SOLN
INTRAMUSCULAR | Status: DC | PRN
  Administered 2018-12-29: 12.5 mg via INTRAVENOUS

## 2018-12-29 MED ORDER — ALBUTEROL SULFATE (2.5 MG/3ML) 0.083% IN NEBU: 3.0000 mL | INHALATION_SOLUTION | Freq: Four times a day (QID) | RESPIRATORY_TRACT | Status: DC | PRN

## 2018-12-29 MED ORDER — PROMETHAZINE HCL 25 MG/ML IJ SOLN: 6.2500 mg | INTRAMUSCULAR | Status: DC | PRN

## 2018-12-29 MED ORDER — HYDROMORPHONE HCL 1 MG/ML IJ SOLN
INTRAMUSCULAR | Status: AC
  Filled 2018-12-29: qty 1

## 2018-12-29 MED ORDER — ALBUTEROL SULFATE HFA 108 (90 BASE) MCG/ACT IN AERS
INHALATION_SPRAY | RESPIRATORY_TRACT | Status: DC | PRN
  Administered 2018-12-29: 4 via RESPIRATORY_TRACT

## 2018-12-29 MED ORDER — HYDROMORPHONE HCL 2 MG PO TABS
2.0000 mg | ORAL_TABLET | ORAL | Status: DC | PRN
  Administered 2018-12-29 – 2018-12-30 (×4): 2 mg via ORAL
  Filled 2018-12-29 (×4): qty 1

## 2018-12-29 MED ORDER — LIDOCAINE HCL (CARDIAC) PF 100 MG/5ML IV SOSY
PREFILLED_SYRINGE | INTRAVENOUS | Status: DC | PRN
  Administered 2018-12-29 (×3): 20 mg via INTRAVENOUS
  Administered 2018-12-29: 60 mg via INTRAVENOUS

## 2018-12-29 MED ORDER — IOHEXOL 300 MG/ML  SOLN
INTRAMUSCULAR | Status: DC | PRN
  Administered 2018-12-29: 30 mL

## 2018-12-29 MED ORDER — PROPOFOL 10 MG/ML IV BOLUS
INTRAVENOUS | Status: AC
  Filled 2018-12-29: qty 40

## 2018-12-29 MED ORDER — DEXAMETHASONE SODIUM PHOSPHATE 10 MG/ML IJ SOLN
INTRAMUSCULAR | Status: DC | PRN
  Administered 2018-12-29: 10 mg via INTRAVENOUS

## 2018-12-29 MED ORDER — ONDANSETRON HCL 4 MG/2ML IJ SOLN: 4.0000 mg | INTRAMUSCULAR | Status: DC | PRN

## 2018-12-29 MED ORDER — CALCIUM CARBONATE ANTACID 500 MG PO CHEW
1.0000 | CHEWABLE_TABLET | Freq: Every day | ORAL | Status: DC | PRN
  Administered 2018-12-29: 200 mg via ORAL
  Filled 2018-12-29: qty 1

## 2018-12-29 SURGICAL SUPPLY — 51 items
BAG URINE DRAINAGE (UROLOGICAL SUPPLIES) ×2 IMPLANT
BASKET STONE 1.7 NGAGE (UROLOGICAL SUPPLIES) IMPLANT
BASKET STONE NCOMPASS (UROLOGICAL SUPPLIES) IMPLANT
BASKET ZERO TIP NITINOL 2.4FR (BASKET) ×2 IMPLANT
BENZOIN TINCTURE PRP APPL 2/3 (GAUZE/BANDAGES/DRESSINGS) ×2 IMPLANT
BLADE SURG 15 STRL LF DISP TIS (BLADE) ×1 IMPLANT
BLADE SURG 15 STRL SS (BLADE) ×1
CATH AINSWORTH 30CC 24FR (CATHETERS) ×2 IMPLANT
CATH FOLEY 2WAY SLVR  5CC 16FR (CATHETERS)
CATH FOLEY 2WAY SLVR 5CC 16FR (CATHETERS) IMPLANT
CATH IMAGER II 65CM (CATHETERS) ×2 IMPLANT
CATH URET 5FR 28IN OPEN ENDED (CATHETERS) ×2 IMPLANT
CATH URET DUAL LUMEN 6-10FR 50 (CATHETERS) ×2 IMPLANT
CATH X-FORCE N30 NEPHROSTOMY (TUBING) ×2 IMPLANT
CHLORAPREP W/TINT 26ML (MISCELLANEOUS) ×2 IMPLANT
COVER WAND RF STERILE (DRAPES) IMPLANT
DRAPE C-ARM 42X120 X-RAY (DRAPES) ×2 IMPLANT
DRAPE LINGEMAN PERC (DRAPES) ×2 IMPLANT
DRSG PAD ABDOMINAL 8X10 ST (GAUZE/BANDAGES/DRESSINGS) IMPLANT
DRSG TEGADERM 4X4.75 (GAUZE/BANDAGES/DRESSINGS) ×4 IMPLANT
DRSG TEGADERM 8X12 (GAUZE/BANDAGES/DRESSINGS) IMPLANT
FIBER LASER FLEXIVA 365 (UROLOGICAL SUPPLIES) IMPLANT
FIBER LASER TRAC TIP (UROLOGICAL SUPPLIES) IMPLANT
GAUZE SPONGE 4X4 12PLY STRL (GAUZE/BANDAGES/DRESSINGS) ×2 IMPLANT
GLOVE BIOGEL M STRL SZ7.5 (GLOVE) ×2 IMPLANT
GOWN STRL REUS W/TWL XL LVL3 (GOWN DISPOSABLE) ×2 IMPLANT
GUIDEWIRE AMPLAZ .035X145 (WIRE) ×4 IMPLANT
GUIDEWIRE ANG ZIPWIRE 038X150 (WIRE) IMPLANT
GUIDEWIRE STR DUAL SENSOR (WIRE) ×4 IMPLANT
HOLDER NEEDLE AMPLATZ W/INSERT (MISCELLANEOUS) ×2 IMPLANT
IV SET EXTENSION CATH 6 NF (IV SETS) ×2 IMPLANT
KIT BASIN OR (CUSTOM PROCEDURE TRAY) ×2 IMPLANT
KIT PROBE 340X3.4XDISP GRN (MISCELLANEOUS) ×1 IMPLANT
KIT PROBE TRILOGY 3.4X340 (MISCELLANEOUS) ×1
MANIFOLD NEPTUNE II (INSTRUMENTS) ×2 IMPLANT
NEEDLE SPNL 20GX3.5 QUINCKE YW (NEEDLE) ×2 IMPLANT
NEEDLE TROCAR 18X15 ECHO (NEEDLE) IMPLANT
NEEDLE TROCAR 18X20 (NEEDLE) IMPLANT
NS IRRIG 1000ML POUR BTL (IV SOLUTION) ×2 IMPLANT
PACK CYSTO (CUSTOM PROCEDURE TRAY) ×2 IMPLANT
PAD ABD 8X10 STRL (GAUZE/BANDAGES/DRESSINGS) IMPLANT
SHEATH PEELAWAY SET 9 (SHEATH) IMPLANT
SPONGE LAP 4X18 RFD (DISPOSABLE) ×2 IMPLANT
STENT CONTOUR 6FRX26X.038 (STENTS) ×2 IMPLANT
SUT ETHILON 3 0 PS 1 (SUTURE) ×2 IMPLANT
SYR 10ML LL (SYRINGE) ×2 IMPLANT
SYR 20CC LL (SYRINGE) ×4 IMPLANT
SYR 50ML LL SCALE MARK (SYRINGE) ×2 IMPLANT
TOWEL OR 17X26 10 PK STRL BLUE (TOWEL DISPOSABLE) ×2 IMPLANT
TRAY FOLEY CATH 16FR SILVER (SET/KITS/TRAYS/PACK) ×2 IMPLANT
TUBING CONNECTING 10 (TUBING) ×4 IMPLANT

## 2018-12-29 NOTE — Anesthesia Procedure Notes (Signed)
Procedure Name: Intubation Date/Time: 12/29/2018 7:50 AM Performed by: Wilder Glade, CRNA Pre-anesthesia Checklist: Patient identified, Emergency Drugs available, Suction available, Patient being monitored and Timeout performed Patient Re-evaluated:Patient Re-evaluated prior to induction Oxygen Delivery Method: Circle system utilized Preoxygenation: Pre-oxygenation with 100% oxygen Induction Type: IV induction Ventilation: Mask ventilation without difficulty Laryngoscope Size: Miller and 2 Grade View: Grade I Tube type: Oral Tube size: 7.5 mm Number of attempts: 1 (brief atraumatic ) Airway Equipment and Method: Stylet Placement Confirmation: ETT inserted through vocal cords under direct vision,  positive ETCO2 and breath sounds checked- equal and bilateral Secured at: 23 cm Tube secured with: Tape Dental Injury: Teeth and Oropharynx as per pre-operative assessment

## 2018-12-29 NOTE — Op Note (Addendum)
Preoperative Diagnosis:   Right renal greater than 2 cm   Postoperative Diagnosis:   Right renal stone greater than 2 cm   Procedure(s) Performed:   1. Right percutaneous nephrostolithotomy for stone burden greater than 2 cm 2. Right percutaneous renal access to establish nephrostomy tract 3. Cystourethroscopy 4. Right ureteral catheterization and retrograde pyelogram 5. Antegrade nephrostogram with nephrostomy tube placement 6. Simple Foley catheter placement 7. Intraoperative fluoroscopy with interpretation less than 1 hour   Teaching Surgeon: Berniece Salines, MD   Resident Surgeon:  Case Lucretia Roers, MD   Assistants:  None listed   Anesthesia:  General via endotracheal tube.     IV Fluids:  See Anesthesia record.   Estimated Blood Loss:  200 mL   Cultures:  None   Drains: 47fr x 26cm JJ ureteral stent without dangler   Specimens:  Right renal stone   Complications:  None.   Indications for Surgery:  [name] is a 43 y.o. female with a history of nephrolithiasis.  The patient was evaluated and noted with large right renal stone burden with a total stone diameter of > 2 cm.  The patient presents today for percutaneous treatment of Right kidney stone. The risks and benefits of the procedure were discussed with the patient who wishes to proceed.   Operative Findings:    - Successful percutaneous access was obtained to the posterior lower pole calix  - All stone was treated, no residual stone noted on pyeloscopy - Successful placement of 90fr x 26cm JJ stent, antegrade  - PCN removed at end of case - tubeless. Draining urine from PCN, prior to removal, was clear pink  Radiologic Interpretation of Retrograde Pyelogram and Anterograde Nephrostogram: - Retrograde pyelogram demonstrated mildly dilated right collecting system with large filling defect in lower pole.  - Completion antegrade nephrostogram post-treatment demonstrated no contrast extravasation or filling  defect.     Procedure:  The patient was correctly identified in the preoperative holding area where written informed consent as well as potential risks and complications were reviewed. The patient was brought back to the operative suite where a preinduction timeout was performed. Once correct information was verified, general anesthesia was induced via endotracheal tube.  The patient was then gently repositioned into the prone split leg position, paying careful attention to pad all pressure points and affixed the patient to the bed at multiple points of contact.  She was then prepped and draped in the usual sterile fashion and given appropriate perioperative procedural antibiotics.  Sequential compression devices were placed for VTE prophylaxis.  A second timeout was then performed.   At the beginning of the case, flexible cystoscope was performed per urethra with copious lubrication and normal saline irrigation running.  The urethra and bladder appeared grossly normal.  Turning our attention to the Right ureteral orifice, we gently cannulated the orifice with a sensor wire which was passed into the renal pelvis. A 5 French open-ended catheter was advanced over the wire into the renal pelvis confirmed by fluoroscopy. A flexible ureteroscope was advanced over the sensor wire to the level of the renal pelvis under fluoroscopic guidance. The wire was removed.   Next, we performed a retrograde pyelogram, which demonstrated findings as above.  We elected to obtain posterior lower pole access and did so using an 18-gauge diamond-tipped Chiba needle using a bullseye technique and and direct visual guidance with a flexible ureteroscope.  After placement of the access needle, a sensor wire was advanced into the kidney  and manipulated down the patient's ureter with the assistance of the Kumpe catheter and flexible ureteroscope. The flexible ureteroscope was removed and a 16 French Foley catheter was placed per urethra  with return of urine with 10 mL's of water in the balloon. The sensor wire was subsequently switched to a superstiff wire. Next, using a combination of catheters and dilators, we placed a second safety wire and then developed our percutaneous tract with the advancement of a 30 French x 20 cm Nephromax balloon dilator.  After this, a 30 French sheath was advanced to the edge of the distal calyx on fluoroscopy.     We then performed rigid nephroscopy with the lithotrite. Immediately upon entrance into the collecting system, we encountered the stone and we then proceeded to treat the stone with lithotripsy. We then switched to flexible nephroscopy and after thorough analysis of the entire collecting system, there was no additional stone burden. We then switched to flexible ureteroscopy and visualized the entire ureter to the bladder in an antegrade fashion. At the conclusion of the procedure, we repeated flexible nephroscopy in the kidney as well and withdrew our sheath over our rigid nephroscope to the edge of renal parenchyma which did not reveal any residual stone fragments.   At this point, we elected to leave a stent. Using the nephroscope, we back loaded a wire and passed a 6Fr x 26cm JJ ureteral stent antegrade, under visual and fluoroscopic guidance. A curl was noted in the renal pelvis and bladder. We passed a 24 French Councill tip nephrostomy tube into the renal pelvis. The sheath was removed. A completion antegrade nephrostogram demonstrated complete filling of the entire renal collecting system with minimal contrast extravasation from the access tract and satisfactory placement of her nephrostomy tube in the renal pelvis. The tube was draining clear urine therefore we opted to remove it and perform a tubeless procedure. The nephrostomy tube was removed. No bleeding was encountered. A figure of eight nylon suture closed the flank incision. We then administered 10 mL's of 1% Lidocaine solution locally  around the flank incision. The site was then dressed in the usual gauze and tape dressing and the patient was carefully returned to supine position.  At this point, the patient was extubated and taken to the recovery area in stable fashion.

## 2018-12-29 NOTE — Transfer of Care (Signed)
Immediate Anesthesia Transfer of Care Note  Patient: [name]  Procedure(s) Performed: NEPHROLITHOTOMY PERCUTANEOUS (Right )  Patient Location: PACU  Anesthesia Type:General  Level of Consciousness: patient cooperative and lethargic  Airway & Oxygen Therapy: Patient Spontanous Breathing and Patient connected to face mask oxygen  Post-op Assessment: Report given to RN and Post -op Vital signs reviewed and stable  Post vital signs: Reviewed and stable  Last Vitals:  Vitals Value Taken Time  BP 151/92 12/29/2018 11:15 AM  Temp    Pulse 93 12/29/2018 11:17 AM  Resp 21 12/29/2018 11:17 AM  SpO2 100 % 12/29/2018 11:17 AM  Vitals shown include unvalidated device data.  Last Pain:  Vitals:   12/29/18 0545  TempSrc: Oral         Complications: No apparent anesthesia complicationsable to be aroused easily to verbal stimulation

## 2018-12-29 NOTE — Anesthesia Preprocedure Evaluation (Signed)
Anesthesia Evaluation  Patient identified by MRN, date of birth, ID band Patient awake    Reviewed: Allergy & Precautions, NPO status , Patient's Chart, lab work & pertinent test results  History of Anesthesia Complications (+) PONV  Airway Mallampati: II  TM Distance: >3 FB Neck ROM: Full    Dental no notable dental hx.    Pulmonary COPD, Current Smoker,    Pulmonary exam normal breath sounds clear to auscultation       Cardiovascular hypertension, Normal cardiovascular exam Rhythm:Regular Rate:Normal     Neuro/Psych negative neurological ROS  negative psych ROS   GI/Hepatic Neg liver ROS, GERD  ,  Endo/Other  negative endocrine ROS  Renal/GU negative Renal ROS  negative genitourinary   Musculoskeletal negative musculoskeletal ROS (+)   Abdominal   Peds negative pediatric ROS (+)  Hematology negative hematology ROS (+)   Anesthesia Other Findings   Reproductive/Obstetrics negative OB ROS                             Anesthesia Physical Anesthesia Plan  ASA: III  Anesthesia Plan: General   Post-op Pain Management:    Induction: Intravenous  PONV Risk Score and Plan: 4 or greater and Ondansetron, Dexamethasone, Treatment may vary due to age or medical condition and Scopolamine patch - Pre-op  Airway Management Planned: Oral ETT  Additional Equipment:   Intra-op Plan:   Post-operative Plan: Extubation in OR  Informed Consent: I have reviewed the patients History and Physical, chart, labs and discussed the procedure including the risks, benefits and alternatives for the proposed anesthesia with the patient or authorized representative who has indicated his/her understanding and acceptance.     Dental advisory given  Plan Discussed with: CRNA and Surgeon  Anesthesia Plan Comments:         Anesthesia Quick Evaluation

## 2018-12-30 ENCOUNTER — Encounter (HOSPITAL_COMMUNITY): Payer: Self-pay | Admitting: Urology

## 2018-12-30 LAB — BASIC METABOLIC PANEL
Anion gap: 5 (ref 5–15)
BUN: 10 mg/dL (ref 6–20)
CO2: 23 mmol/L (ref 22–32)
Calcium: 8.1 mg/dL — ABNORMAL LOW (ref 8.9–10.3)
Chloride: 108 mmol/L (ref 98–111)
Creatinine, Ser: 0.84 mg/dL (ref 0.44–1.00)
GFR calc Af Amer: >60 mL/min (ref >60)
GFR calc non Af Amer: >60 mL/min (ref >60)
Glucose, Bld: 155 mg/dL — ABNORMAL HIGH (ref 70–99)
Potassium: 4.3 mmol/L (ref 3.5–5.1)
Sodium: 136 mmol/L (ref 135–145)

## 2018-12-30 LAB — CBC
HEMATOCRIT: 38.6 % (ref 36.0–46.0)
Hemoglobin: 12.1 g/dL (ref 12.0–15.0)
MCH: 30.9 pg (ref 26.0–34.0)
MCHC: 31.3 g/dL (ref 30.0–36.0)
MCV: 98.7 fL (ref 80.0–100.0)
Platelets: 184 10*3/uL (ref 150–400)
RBC: 3.91 MIL/uL (ref 3.87–5.11)
RDW: 13.5 % (ref 11.5–15.5)
WBC: 19.7 10*3/uL — ABNORMAL HIGH (ref 4.0–10.5)
nRBC: 0 % (ref 0.0–0.2)

## 2018-12-30 MED ORDER — HYDROCODONE-ACETAMINOPHEN 5-325 MG PO TABS: 2.0000 | ORAL_TABLET | Freq: Four times a day (QID) | ORAL | 0 refills | Status: DC | PRN

## 2018-12-30 MED ORDER — CIPROFLOXACIN HCL 250 MG PO TABS: 250.0000 mg | ORAL_TABLET | Freq: Two times a day (BID) | ORAL | 0 refills | Status: AC

## 2018-12-30 NOTE — Plan of Care (Signed)
Pt has a good appetite and is tolerating regular food well. Pt calm and cooperative this shift. Urine OP is good and prescribed pain meds are effective.

## 2018-12-30 NOTE — Anesthesia Postprocedure Evaluation (Signed)
Anesthesia Post Note  Patient: [name]  Procedure(s) Performed: NEPHROLITHOTOMY PERCUTANEOUS (Right )     Patient location during evaluation: PACU Anesthesia Type: General Level of consciousness: awake and alert Pain management: pain level controlled Vital Signs Assessment: post-procedure vital signs reviewed and stable Respiratory status: spontaneous breathing, nonlabored ventilation, respiratory function stable and patient connected to nasal cannula oxygen Cardiovascular status: blood pressure returned to baseline and stable Postop Assessment: no apparent nausea or vomiting Anesthetic complications: no    Last Vitals:  Vitals:   12/30/18 0159 12/30/18 0601  BP: 140/69 (!) 137/58  Pulse: 83 74  Resp: 14 16  Temp: 36.7 C (!) 36.2 C  SpO2: 96% 97%    Last Pain:  Vitals:   12/30/18 1000  TempSrc:   PainSc: 5                  Naomi Fitton S

## 2018-12-30 NOTE — Discharge Summary (Signed)
[facility] Discharge Summary  Admit date: 12/29/2018  Discharge date and time: 12/30/18   Discharge to: Home  Discharge Service: Urology  Discharge Attending Physician:  Berniece Salines, MD  Discharge  Diagnoses: <principal problem not specified>  Secondary Diagnosis: Active Problems:   Nephrolithiasis   OR Procedures: Procedure(s): NEPHROLITHOTOMY PERCUTANEOUS 12/29/2018   Ancillary Procedures: None   Discharge Day Services: The patient was seen and examined by the Urology team both in the morning and immediately prior to discharge.  Vital signs and laboratory values were stable and within normal limits.  The physical exam was benign and unchanged and all surgical wounds were examined.  Discharge instructions were explained and all questions answered.  Subjective  No acute events overnight. Pain Controlled. No fever or chills.  Objective Patient Vitals for the past 8 hrs:  BP Temp Temp src Pulse Resp SpO2  12/30/18 0601 (!) 137/58 (!) 97.2 F (36.2 C) Oral 74 16 97 %  12/30/18 0159 140/69 98.1 F (36.7 C) Oral 83 14 96 %   No intake/output data recorded.  General Appearance:        No acute distress Lungs:                       Normal work of breathing on room air Heart:                                Regular rate and rhythm Abdomen:                         Soft, non-tender, non-distended Extremities:                      Warm and well perfused   Hospital Course:  The patient underwent right PCNL on 12/29/2018.  The patient tolerated the procedure well, was extubated in the OR, and afterwards was taken to the PACU for routine post-surgical care. When stable the patient was transferred to the floor.   The patient did well postoperatively.  The patient's diet was slowly advanced and at the time of discharge was tolerating a regular diet.  The patient was discharged home 1 Day Post-Op, at which point was tolerating a regular solid diet, was able to void spontaneously,  have adequate pain control with P.O. pain medication, and could ambulate without difficulty. The patient will follow up with [place] for post op check on 2/13 for stent/suture removal. She will need metabolic workup for her kidney stone disease.  Condition at Discharge: Improved  Discharge Medications:  Allergies as of 12/30/2018      Reactions   Hydrocodone Nausea And Vomiting   Morphine And Related Hives, Other (See Comments)   Welps   Vicodin [hydrocodone-acetaminophen] Nausea And Vomiting      Medication List    TAKE these medications   albuterol 108 (90 Base) MCG/ACT inhaler Commonly known as:  PROVENTIL HFA;VENTOLIN HFA Inhale 2 puffs into the lungs every 6 (six) hours as needed. For asthma What changed:    reasons to take this  additional instructions   ciprofloxacin 250 MG tablet Commonly known as:  CIPRO Take 1 tablet (250 mg total) by mouth 2 (two) times daily for 3 days. Begin the day prior to stent removal.   HYDROcodone-acetaminophen 5-325 MG tablet Commonly known as:  NORCO Take 2 tablets by mouth every 6 (six) hours as  needed for moderate pain.   HYDROMET 5-1.5 MG/5ML syrup Generic drug:  HYDROcodone-homatropine Take 10 mLs by mouth every 6 (six) hours as needed for cough.   predniSONE 20 MG tablet Commonly known as:  DELTASONE Take 40 mg by mouth daily with breakfast.

## 2019-10-09 ENCOUNTER — Encounter (HOSPITAL_COMMUNITY): Payer: Self-pay | Admitting: *Deleted

## 2019-10-09 ENCOUNTER — Other Ambulatory Visit: Payer: Self-pay

## 2019-10-09 ENCOUNTER — Emergency Department (HOSPITAL_COMMUNITY)
Admission: EM | Admit: 2019-10-09 | Discharge: 2019-10-10 | Disposition: A | Discharge status: Home / Self Care | Payer: Commercial Managed Care - PPO | Attending: Emergency Medicine | Admitting: Emergency Medicine

## 2019-10-09 DIAGNOSIS — J449 Chronic obstructive pulmonary disease, unspecified: Secondary | ICD-10-CM | POA: Diagnosis not present

## 2019-10-09 DIAGNOSIS — I10 Essential (primary) hypertension: Secondary | ICD-10-CM | POA: Diagnosis not present

## 2019-10-09 DIAGNOSIS — N201 Calculus of ureter: Secondary | ICD-10-CM | POA: Diagnosis not present

## 2019-10-09 DIAGNOSIS — N3001 Acute cystitis with hematuria: Secondary | ICD-10-CM

## 2019-10-09 DIAGNOSIS — N3091 Cystitis, unspecified with hematuria: Secondary | ICD-10-CM | POA: Diagnosis not present

## 2019-10-09 DIAGNOSIS — F17290 Nicotine dependence, other tobacco product, uncomplicated: Secondary | ICD-10-CM | POA: Diagnosis not present

## 2019-10-09 DIAGNOSIS — N1330 Unspecified hydronephrosis: Secondary | ICD-10-CM | POA: Diagnosis not present

## 2019-10-09 DIAGNOSIS — R1030 Lower abdominal pain, unspecified: Secondary | ICD-10-CM | POA: Diagnosis present

## 2019-10-09 DIAGNOSIS — N132 Hydronephrosis with renal and ureteral calculous obstruction: Secondary | ICD-10-CM

## 2019-10-09 DIAGNOSIS — J4540 Moderate persistent asthma, uncomplicated: Secondary | ICD-10-CM | POA: Insufficient documentation

## 2019-10-09 DIAGNOSIS — R109 Unspecified abdominal pain: Secondary | ICD-10-CM

## 2019-10-09 NOTE — ED Triage Notes (Signed)
Pt c/o right flank pain that radiates around to her right abdomen; pt states the pain started around 9pm tonight but she has been having pressure and states she has had urgency today; pt has a hx of kidney stones

## 2019-10-10 ENCOUNTER — Emergency Department (HOSPITAL_COMMUNITY): Payer: Commercial Managed Care - PPO

## 2019-10-10 LAB — URINALYSIS, ROUTINE W REFLEX MICROSCOPIC
Bilirubin Urine: NEGATIVE
Glucose, UA: NEGATIVE mg/dL
Ketones, ur: NEGATIVE mg/dL
Nitrite: NEGATIVE
Protein, ur: 30 mg/dL — AB
RBC / HPF: >50 RBC/hpf — ABNORMAL HIGH (ref 0–5)
Specific Gravity, Urine: 1.024 (ref 1.005–1.030)
WBC, UA: >50 WBC/hpf — ABNORMAL HIGH (ref 0–5)
pH: 5 (ref 5.0–8.0)

## 2019-10-10 LAB — CBC WITH DIFFERENTIAL/PLATELET
Abs Immature Granulocytes: 0.05 10*3/uL (ref 0.00–0.07)
Basophils Absolute: 0.1 10*3/uL (ref 0.0–0.1)
Basophils Relative: 0 %
Eosinophils Absolute: 0.2 10*3/uL (ref 0.0–0.5)
Eosinophils Relative: 1 %
HCT: 41.2 % (ref 36.0–46.0)
Hemoglobin: 13.5 g/dL (ref 12.0–15.0)
Immature Granulocytes: 0 %
Lymphocytes Relative: 26 %
Lymphs Abs: 3.1 10*3/uL (ref 0.7–4.0)
MCH: 30.8 pg (ref 26.0–34.0)
MCHC: 32.8 g/dL (ref 30.0–36.0)
MCV: 93.8 fL (ref 80.0–100.0)
Monocytes Absolute: 0.8 10*3/uL (ref 0.1–1.0)
Monocytes Relative: 6 %
Neutro Abs: 7.9 10*3/uL — ABNORMAL HIGH (ref 1.7–7.7)
Neutrophils Relative %: 67 %
Platelets: 250 10*3/uL (ref 150–400)
RBC: 4.39 MIL/uL (ref 3.87–5.11)
RDW: 13.2 % (ref 11.5–15.5)
WBC: 12 10*3/uL — ABNORMAL HIGH (ref 4.0–10.5)
nRBC: 0 % (ref 0.0–0.2)

## 2019-10-10 LAB — BASIC METABOLIC PANEL
Anion gap: 7 (ref 5–15)
BUN: 17 mg/dL (ref 6–20)
CO2: 23 mmol/L (ref 22–32)
Calcium: 8.8 mg/dL — ABNORMAL LOW (ref 8.9–10.3)
Chloride: 107 mmol/L (ref 98–111)
Creatinine, Ser: 0.96 mg/dL (ref 0.44–1.00)
GFR calc Af Amer: >60 mL/min (ref >60)
GFR calc non Af Amer: >60 mL/min (ref >60)
Glucose, Bld: 125 mg/dL — ABNORMAL HIGH (ref 70–99)
Potassium: 3.9 mmol/L (ref 3.5–5.1)
Sodium: 137 mmol/L (ref 135–145)

## 2019-10-10 MED ORDER — ONDANSETRON HCL 4 MG/2ML IJ SOLN
4.0000 mg | Freq: Once | INTRAMUSCULAR | Status: AC
  Administered 2019-10-10: 4 mg via INTRAVENOUS
  Filled 2019-10-10: qty 2

## 2019-10-10 MED ORDER — OXYCODONE-ACETAMINOPHEN 5-325 MG PO TABS: 1.0000 | ORAL_TABLET | Freq: Three times a day (TID) | ORAL | 0 refills | Status: DC | PRN

## 2019-10-10 MED ORDER — KETOROLAC TROMETHAMINE 30 MG/ML IJ SOLN
30.0000 mg | Freq: Once | INTRAMUSCULAR | Status: AC
  Administered 2019-10-10: 30 mg via INTRAVENOUS
  Filled 2019-10-10: qty 1

## 2019-10-10 MED ORDER — SODIUM CHLORIDE 0.9 % IV BOLUS (SEPSIS)
1000.0000 mL | Freq: Once | INTRAVENOUS | Status: AC
  Administered 2019-10-10: 1000 mL via INTRAVENOUS

## 2019-10-10 MED ORDER — SODIUM CHLORIDE 0.9 % IV SOLN
1.0000 g | Freq: Once | INTRAVENOUS | Status: AC
  Administered 2019-10-10: 1 g via INTRAVENOUS
  Filled 2019-10-10: qty 10

## 2019-10-10 MED ORDER — CEPHALEXIN 500 MG PO CAPS: 500.0000 mg | ORAL_CAPSULE | Freq: Three times a day (TID) | ORAL | 0 refills | Status: DC

## 2019-10-10 MED ORDER — FENTANYL CITRATE (PF) 100 MCG/2ML IJ SOLN
100.0000 ug | Freq: Once | INTRAMUSCULAR | Status: AC
  Administered 2019-10-10: 100 ug via INTRAVENOUS
  Filled 2019-10-10: qty 2

## 2019-10-10 NOTE — ED Provider Notes (Signed)
[facility] EMERGENCY DEPARTMENT Provider Note   CSN: [id number] Arrival date & time: 10/09/19  2250     History   Chief Complaint Chief Complaint  Patient presents with   Flank Pain    HPI [name] is a 43 y.o. female.     The history is provided by the patient.  Flank Pain This is a new problem. The current episode started 3 to 5 hours ago. The problem occurs constantly. The problem has been rapidly worsening. Associated symptoms include abdominal pain. Pertinent negatives include no chest pain and no shortness of breath. Nothing aggravates the symptoms. Nothing relieves the symptoms.  Patient with history of COPD, GERD, kidney stones presents with flank pain.  She reports sudden onset of flank pain several hours ago.  It wraps around to her abdomen No fevers but she is having nausea vomiting.  No chest pain, No cough  Past Medical History:  Diagnosis Date   Asthma    COPD (chronic obstructive pulmonary disease) (HCC)    sees PCP   Depression    GERD (gastroesophageal reflux disease)    History of kidney stones    Hypertension    pt. denies  never taken meds   Pneumonia    PONV (postoperative nausea and vomiting)     Patient Active Problem List   Diagnosis Date Noted   Nephrolithiasis 12/29/2018   Stone, kidney 04/15/2018   Asthma, moderate persistent 11/12/2015   Leukocytosis 09/25/2011   Anxiety 09/25/2011   Acute exacerbation of COPD with asthma (HCC) 09/24/2011   Acute bronchitis 09/23/2011   Aspiration of gastric contents 08/04/2011   COPD (chronic obstructive pulmonary disease) (HCC) 08/04/2011   Kidney stones 08/04/2011    Past Surgical History:  Procedure Laterality Date   ABDOMINAL HYSTERECTOMY     CHOLECYSTECTOMY     CYSTOSCOPY W/ RETROGRADES  08/04/2011   Procedure: CYSTOSCOPY WITH RETROGRADE PYELOGRAM;  Surgeon: Ky Barban;  Location: [facility];  Service: Urology;  Laterality: Right;  Right Retrograde    HOLMIUM LASER APPLICATION N/A 04/15/2018   Procedure: HOLMIUM LASER APPLICATION;  Surgeon: Crist Fat, MD;  Location: [facility];  Service: Urology;  Laterality: N/A;   LITHOTRIPSY     NEPHROLITHOTOMY Right 04/15/2018   Procedure: RIGHT NEPHROLITHOTOMY PERCUTANEOUS WITH ACCESS;  Surgeon: Crist Fat, MD;  Location: [facility];  Service: Urology;  Laterality: Right;   NEPHROLITHOTOMY Right 12/29/2018   Procedure: NEPHROLITHOTOMY PERCUTANEOUS;  Surgeon: Crist Fat, MD;  Location: [facility];  Service: Urology;  Laterality: Right;   PERCUTANEOUS NEPHROLITHOTRIPSY  2012   both sides kidney    TUBAL LIGATION       OB History    Gravida  3   Para  3   Term  3   Preterm      AB      Living  3     SAB      TAB      Ectopic      Multiple      Live Births               Home Medications    Prior to Admission medications   Medication Sig Start Date End Date Taking? Authorizing Provider  albuterol (PROVENTIL HFA;VENTOLIN HFA) 108 (90 Base) MCG/ACT inhaler Inhale 2 puffs into the lungs every 6 (six) hours as needed. For asthma Patient taking differently: Inhale 2 puffs into the lungs every 6 (six) hours as needed for wheezing or shortness of  breath.  01/17/16   Timmothy Euler, MD    Family History Family History  Problem Relation Age of Onset   COPD Mother    COPD Father    Heart attack Father     Social History Social History   Tobacco Use   Smoking status: Current Every Day Smoker    Packs/day: 0.50   Smokeless tobacco: Never Used  Substance Use Topics   Alcohol use: No   Drug use: No     Allergies   Hydrocodone, Morphine and related, and Vicodin [hydrocodone-acetaminophen]   Review of Systems Review of Systems  Constitutional: Negative for fever.  Respiratory: Negative for shortness of breath.   Cardiovascular: Negative for chest pain.  Gastrointestinal: Positive for abdominal pain.  Genitourinary: Positive for flank pain  and urgency.  All other systems reviewed and are negative.    Physical Exam Updated Vital Signs BP (!) 126/95 (BP Location: Right Arm)    Pulse 90    Temp 97.9 F (36.6 C) (Oral)    Resp 18    Ht 1.651 m (5\' 5" )    Wt 106.1 kg    LMP 11/05/2015    SpO2 98%    BMI 38.94 kg/m   Physical Exam CONSTITUTIONAL: Well developed/well nourished, uncomfortable appearing HEAD: Normocephalic/atraumatic EYES: EOMI/PERRL ENMT: Mucous membranes moist NECK: supple no meningeal signs SPINE/BACK:entire spine nontender CV: S1/S2 noted, no murmurs/rubs/gallops noted LUNGS: Lungs are clear to auscultation bilaterally, no apparent distress ABDOMEN: soft, nontender, no rebound or guarding, bowel sounds noted throughout abdomen GU: Right cva tenderness NEURO: Pt is awake/alert/appropriate, moves all extremitiesx4.  No facial droop.   EXTREMITIES: pulses normal/equal, full ROM SKIN: warm, color normal PSYCH: no abnormalities of mood noted, alert and oriented to situation   ED Treatments / Results  Labs (all labs ordered are listed, but only abnormal results are displayed) Labs Reviewed  URINALYSIS, ROUTINE W REFLEX MICROSCOPIC - Abnormal; Notable for the following components:      Result Value   APPearance CLOUDY (*)    Hgb urine dipstick MODERATE (*)    Protein, ur 30 (*)    Leukocytes,Ua MODERATE (*)    RBC / HPF >50 (*)    WBC, UA >50 (*)    Bacteria, UA RARE (*)    All other components within normal limits  CBC WITH DIFFERENTIAL/PLATELET - Abnormal; Notable for the following components:   WBC 12.0 (*)    Neutro Abs 7.9 (*)    All other components within normal limits  BASIC METABOLIC PANEL - Abnormal; Notable for the following components:   Glucose, Bld 125 (*)    Calcium 8.8 (*)    All other components within normal limits  URINE CULTURE    EKG None  Radiology Dg Abdomen 1 View  Result Date: 10/10/2019 CLINICAL DATA:  Pain EXAM: ABDOMEN - 1 VIEW COMPARISON:  None. FINDINGS: The  bowel gas pattern is normal. Moderate amount of right colonic stool. Surgical clips seen in the right upper quadrant. No radio-opaque calculi or other significant radiographic abnormality are seen. IMPRESSION: Nonobstructive bowel gas pattern. Electronically Signed   By: Prudencio Pair M.D.   On: 10/10/2019 00:48   Ct Renal Stone Study  Result Date: 10/10/2019 CLINICAL DATA:  Right flank pain EXAM: CT ABDOMEN AND PELVIS WITHOUT CONTRAST TECHNIQUE: Multidetector CT imaging of the abdomen and pelvis was performed following the standard protocol without IV contrast. COMPARISON:  Most recent prior Apr 16, 2018 FINDINGS: Lower chest: The visualized heart size  within normal limits. No pericardial fluid/thickening. There is a moderate paraesophageal hernia. The visualized portions of the lungs are clear. Hepatobiliary: Although limited due to the lack of intravenous contrast, normal in appearance without gross focal abnormality. The patient is status post cholecystectomy. No biliary ductal dilation. Pancreas:  Unremarkable.  No surrounding inflammatory changes. Spleen: Normal in size. Although limited due to the lack of intravenous contrast, normal in appearance. Adrenals/Urinary Tract: Both adrenal glands appear normal. Again noted is bilateral nephrocalcinosis. There is new severe right pelvicaliectasis and ureterectasis down to the level of the distal ureter where there is a 7 mm calculus. There is significant right perinephric and periureteral stranding. Within the left kidney there several renal calculi, the largest within the lower pole measuring 8 mm. There is mild proximal periureteral stranding. No distal left ureteral calculi are seen. No bladder calculi are noted. Stomach/Bowel: The stomach, small bowel, and colon are normal in appearance. No inflammatory changes or obstructive findings. appendix is normal. Vascular/Lymphatic: There are no enlarged abdominal or pelvic lymph nodes. No significant gross  vascular findings are present. Reproductive: Other: No evidence of abdominal wall mass or hernia. Musculoskeletal: No acute or significant osseous findings. IMPRESSION: 1. Obstructing 7 mm right distal ureteral calculus causing severe right hydronephrosis with perinephric/ureteral inflammatory changes. 2. Nonobstructing left renal calculi the largest measuring 8 mm in the lower pole with mild proximal inflammatory changes which could be due to ureteritis. 3. Findings suggestive of bilateral nephrocalcinosis. Electronically Signed   By: Jonna Clark M.D.   On: 10/10/2019 01:35    Procedures Procedures   Medications Ordered in ED Medications  fentaNYL (SUBLIMAZE) injection 100 mcg (100 mcg Intravenous Given 10/10/19 0022)  ondansetron (ZOFRAN) injection 4 mg (4 mg Intravenous Given 10/10/19 0022)  ketorolac (TORADOL) 30 MG/ML injection 30 mg (30 mg Intravenous Given 10/10/19 0022)  sodium chloride 0.9 % bolus 1,000 mL (1,000 mLs Intravenous New Bag/Given 10/10/19 0022)  cefTRIAXone (ROCEPHIN) 1 g in sodium chloride 0.9 % 100 mL IVPB (1 g Intravenous New Bag/Given 10/10/19 0115)  fentaNYL (SUBLIMAZE) injection 100 mcg (100 mcg Intravenous Given 10/10/19 0116)  ondansetron (ZOFRAN) injection 4 mg (4 mg Intravenous Given 10/10/19 0116)     Initial Impression / Assessment and Plan / ED Course  I have reviewed the triage vital signs and the nursing notes.  Pertinent labs & imaging results that were available during my care of the patient were reviewed by me and considered in my medical decision making (see chart for details).        12:09 AM Patient with history of kidney stones required urological seizure 4.  Will treat pain and reassess.  Also start with KUB x-ray to identify any stones and will initially defer CT imaging 1:08 AM Patient reports continued pain and nausea. Urine does have evidence of infection.  X-ray is unrevealing.  Will need to proceed with CT imaging to evaluate for a  stone that did not appear on x-ray. 2:18 AM CT imaging reveals distal ureteral stone approximately 7 mm. However patient is feeling improved.  She is in no acute distress.  She is afebrile. She feels comfortable for discharge home.  She will likely require procedure as the stone may not pass.  She will call urology later today.  Urine culture has been sent.  She has been started on antibiotics.  She request Percocet for pain control We discussed return precautions Final Clinical Impressions(s) / ED Diagnoses   Final diagnoses:  Flank pain  Ureteral  stone with hydronephrosis  Acute cystitis with hematuria    ED Discharge Orders         Ordered    cephALEXin (KEFLEX) 500 MG capsule  3 times daily     10/10/19 0203    oxyCODONE-acetaminophen (PERCOCET) 5-325 MG tablet  Every 8 hours PRN     10/10/19 0206           Zadie RhineWickline, Kerri Kovacik, MD 10/10/19 [phone]

## 2019-10-11 LAB — URINE CULTURE: Culture: NO GROWTH

## 2019-10-12 ENCOUNTER — Other Ambulatory Visit: Payer: Self-pay

## 2019-10-12 ENCOUNTER — Emergency Department (HOSPITAL_COMMUNITY): Payer: Commercial Managed Care - PPO

## 2019-10-12 ENCOUNTER — Encounter (HOSPITAL_COMMUNITY): Payer: Self-pay

## 2019-10-12 ENCOUNTER — Emergency Department (HOSPITAL_COMMUNITY)
Admission: EM | Admit: 2019-10-12 | Discharge: 2019-10-12 | Disposition: A | Discharge status: Home / Self Care | Payer: Commercial Managed Care - PPO | Attending: Emergency Medicine | Admitting: Emergency Medicine

## 2019-10-12 DIAGNOSIS — R109 Unspecified abdominal pain: Secondary | ICD-10-CM

## 2019-10-12 DIAGNOSIS — N200 Calculus of kidney: Secondary | ICD-10-CM | POA: Insufficient documentation

## 2019-10-12 DIAGNOSIS — I10 Essential (primary) hypertension: Secondary | ICD-10-CM | POA: Insufficient documentation

## 2019-10-12 DIAGNOSIS — J449 Chronic obstructive pulmonary disease, unspecified: Secondary | ICD-10-CM | POA: Diagnosis not present

## 2019-10-12 DIAGNOSIS — Z20828 Contact with and (suspected) exposure to other viral communicable diseases: Secondary | ICD-10-CM | POA: Diagnosis not present

## 2019-10-12 DIAGNOSIS — Z79899 Other long term (current) drug therapy: Secondary | ICD-10-CM | POA: Insufficient documentation

## 2019-10-12 LAB — BASIC METABOLIC PANEL
Anion gap: 8 (ref 5–15)
BUN: 13 mg/dL (ref 6–20)
CO2: 26 mmol/L (ref 22–32)
Calcium: 8.4 mg/dL — ABNORMAL LOW (ref 8.9–10.3)
Chloride: 106 mmol/L (ref 98–111)
Creatinine, Ser: 0.89 mg/dL (ref 0.44–1.00)
GFR calc Af Amer: >60 mL/min (ref >60)
GFR calc non Af Amer: >60 mL/min (ref >60)
Glucose, Bld: 155 mg/dL — ABNORMAL HIGH (ref 70–99)
Potassium: 3.6 mmol/L (ref 3.5–5.1)
Sodium: 140 mmol/L (ref 135–145)

## 2019-10-12 LAB — CBC
HCT: 41.2 % (ref 36.0–46.0)
Hemoglobin: 13.1 g/dL (ref 12.0–15.0)
MCH: 30.6 pg (ref 26.0–34.0)
MCHC: 31.8 g/dL (ref 30.0–36.0)
MCV: 96.3 fL (ref 80.0–100.0)
Platelets: 239 10*3/uL (ref 150–400)
RBC: 4.28 MIL/uL (ref 3.87–5.11)
RDW: 13 % (ref 11.5–15.5)
WBC: 7.7 10*3/uL (ref 4.0–10.5)
nRBC: 0 % (ref 0.0–0.2)

## 2019-10-12 LAB — SARS CORONAVIRUS 2 (TAT 6-24 HRS): SARS Coronavirus 2: NEGATIVE

## 2019-10-12 LAB — URINALYSIS, ROUTINE W REFLEX MICROSCOPIC
Bilirubin Urine: NEGATIVE
Glucose, UA: NEGATIVE mg/dL
Ketones, ur: NEGATIVE mg/dL
Nitrite: NEGATIVE
Protein, ur: NEGATIVE mg/dL
Specific Gravity, Urine: 1.012 (ref 1.005–1.030)
pH: 6 (ref 5.0–8.0)

## 2019-10-12 LAB — POC URINE PREG, ED: Preg Test, Ur: NEGATIVE

## 2019-10-12 MED ORDER — KETOROLAC TROMETHAMINE 10 MG PO TABS: 10.0000 mg | ORAL_TABLET | Freq: Four times a day (QID) | ORAL | 0 refills | Status: DC | PRN

## 2019-10-12 MED ORDER — KETOROLAC TROMETHAMINE 30 MG/ML IJ SOLN
30.0000 mg | Freq: Once | INTRAMUSCULAR | Status: AC
  Administered 2019-10-12: 30 mg via INTRAVENOUS
  Filled 2019-10-12: qty 1

## 2019-10-12 MED ORDER — DIAZEPAM 5 MG PO TABS: 5.0000 mg | ORAL_TABLET | Freq: Two times a day (BID) | ORAL | 0 refills | Status: DC

## 2019-10-12 MED ORDER — SODIUM CHLORIDE 0.9 % IV SOLN
INTRAVENOUS | Status: DC
  Administered 2019-10-12: 09:00:00 via INTRAVENOUS

## 2019-10-12 MED ORDER — ONDANSETRON 4 MG PO TBDP: 4.0000 mg | ORAL_TABLET | Freq: Three times a day (TID) | ORAL | 0 refills | Status: DC | PRN

## 2019-10-12 MED ORDER — FENTANYL CITRATE (PF) 100 MCG/2ML IJ SOLN
100.0000 ug | Freq: Once | INTRAMUSCULAR | Status: AC
  Administered 2019-10-12: 100 ug via INTRAVENOUS
  Filled 2019-10-12: qty 2

## 2019-10-12 MED ORDER — ONDANSETRON HCL 4 MG/2ML IJ SOLN
4.0000 mg | Freq: Once | INTRAMUSCULAR | Status: AC
  Administered 2019-10-12: 4 mg via INTRAVENOUS
  Filled 2019-10-12: qty 2

## 2019-10-12 MED ORDER — SODIUM CHLORIDE 0.9 % IV BOLUS
1000.0000 mL | Freq: Once | INTRAVENOUS | Status: AC
  Administered 2019-10-12: 1000 mL via INTRAVENOUS

## 2019-10-12 MED ORDER — HYDROMORPHONE HCL 1 MG/ML IJ SOLN
1.0000 mg | Freq: Once | INTRAMUSCULAR | Status: AC
  Administered 2019-10-12: 1 mg via INTRAVENOUS
  Filled 2019-10-12: qty 1

## 2019-10-12 MED ORDER — FENTANYL CITRATE (PF) 100 MCG/2ML IJ SOLN
50.0000 ug | Freq: Once | INTRAMUSCULAR | Status: AC
  Administered 2019-10-12: 50 ug via INTRAVENOUS
  Filled 2019-10-12: qty 2

## 2019-10-12 NOTE — ED Provider Notes (Signed)
[facility] EMERGENCY DEPARTMENT Provider Note   CSN: [id number] Arrival date & time: 10/12/19  0705     History   Chief Complaint Chief Complaint  Patient presents with  . Flank Pain    HPI [name] is a 43 y.o. female.     Pt presents to the ED today with right sided flank pain.  Pt was here on 11/10 and was diagnosed with a 7 mm kidney stone on the right.  She opted to go home with outpatient f/u with urology.  Pt has called the urology office and they were trying to work her in, but this appointment has not yet been scheduled.  Pt said her pain has come back and so has nausea.  She has not been able to keep anything down.     Past Medical History:  Diagnosis Date  . Asthma   . COPD (chronic obstructive pulmonary disease) ([place])    sees PCP  . Depression   . GERD (gastroesophageal reflux disease)   . History of kidney stones   . Hypertension    pt. denies  never taken meds  . Pneumonia   . PONV (postoperative nausea and vomiting)     Patient Active Problem List   Diagnosis Date Noted  . Nephrolithiasis 12/29/2018  . Stone, kidney 04/15/2018  . Asthma, moderate persistent 11/12/2015  . Leukocytosis 09/25/2011  . Anxiety 09/25/2011  . Acute exacerbation of COPD with asthma ([place]) 09/24/2011  . Acute bronchitis 09/23/2011  . Aspiration of gastric contents 08/04/2011  . COPD (chronic obstructive pulmonary disease) ([place]) 08/04/2011  . Kidney stones 08/04/2011    Past Surgical History:  Procedure Laterality Date  . ABDOMINAL HYSTERECTOMY    . CHOLECYSTECTOMY    . CYSTOSCOPY W/ RETROGRADES  08/04/2011   Procedure: CYSTOSCOPY WITH RETROGRADE PYELOGRAM;  Surgeon: Marissa Nestle;  Location: [facility];  Service: Urology;  Laterality: Right;  Right Retrograde  . HOLMIUM LASER APPLICATION N/A 9/92/4268   Procedure: HOLMIUM LASER APPLICATION;  Surgeon: Ardis Hughs, MD;  Location: [facility];  Service: Urology;  Laterality: N/A;  . LITHOTRIPSY    .  NEPHROLITHOTOMY Right 04/15/2018   Procedure: RIGHT NEPHROLITHOTOMY PERCUTANEOUS WITH ACCESS;  Surgeon: Ardis Hughs, MD;  Location: [facility];  Service: Urology;  Laterality: Right;  . NEPHROLITHOTOMY Right 12/29/2018   Procedure: NEPHROLITHOTOMY PERCUTANEOUS;  Surgeon: Ardis Hughs, MD;  Location: [facility];  Service: Urology;  Laterality: Right;  . PERCUTANEOUS NEPHROLITHOTRIPSY  2012   both sides kidney   . TUBAL LIGATION       OB History    Gravida  3   Para  3   Term  3   Preterm      AB      Living  3     SAB      TAB      Ectopic      Multiple      Live Births               Home Medications    Prior to Admission medications   Medication Sig Start Date End Date Taking? Authorizing Provider  albuterol (PROVENTIL HFA;VENTOLIN HFA) 108 (90 Base) MCG/ACT inhaler Inhale 2 puffs into the lungs every 6 (six) hours as needed. For asthma Patient taking differently: Inhale 2 puffs into the lungs every 6 (six) hours as needed for wheezing or shortness of breath.  01/17/16  Yes Timmothy Euler, MD  cephALEXin (KEFLEX) 500 MG capsule  Take 1 capsule (500 mg total) by mouth 3 (three) times daily. 10/10/19  Yes Zadie RhineWickline, Donald, MD  oxyCODONE-acetaminophen (PERCOCET) 5-325 MG tablet Take 1 tablet by mouth every 8 (eight) hours as needed. 10/10/19  Yes Zadie RhineWickline, Donald, MD  diazepam (VALIUM) 5 MG tablet Take 1 tablet (5 mg total) by mouth 2 (two) times daily. 10/12/19   Jacalyn LefevreHaviland, Samarie Pinder, MD  ketorolac (TORADOL) 10 MG tablet Take 1 tablet (10 mg total) by mouth every 6 (six) hours as needed. 10/12/19   Jacalyn LefevreHaviland, Tyeler Goedken, MD  ondansetron (ZOFRAN ODT) 4 MG disintegrating tablet Take 1 tablet (4 mg total) by mouth every 8 (eight) hours as needed. 10/12/19   Jacalyn LefevreHaviland, Nimra Puccinelli, MD    Family History Family History  Problem Relation Age of Onset  . COPD Mother   . COPD Father   . Heart attack Father     Social History Social History   Tobacco Use  . Smoking status:  Current Every Day Smoker    Packs/day: 0.50  . Smokeless tobacco: Never Used  Substance Use Topics  . Alcohol use: No  . Drug use: No     Allergies   Hydrocodone, Morphine and related, and Vicodin [hydrocodone-acetaminophen]   Review of Systems Review of Systems  Genitourinary: Positive for flank pain.  All other systems reviewed and are negative.    Physical Exam Updated Vital Signs BP 127/74   Pulse 76   Temp 98.3 F (36.8 C) (Oral)   Resp 12   Wt 106 kg   LMP 11/05/2015   SpO2 98%   BMI 38.89 kg/m   Physical Exam Vitals signs and nursing note reviewed.  Constitutional:      Appearance: Normal appearance.  HENT:     Head: Normocephalic and atraumatic.     Right Ear: External ear normal.     Left Ear: External ear normal.     Nose: Nose normal.     Mouth/Throat:     Mouth: Mucous membranes are moist.     Pharynx: Oropharynx is clear.  Eyes:     Extraocular Movements: Extraocular movements intact.     Conjunctiva/sclera: Conjunctivae normal.     Pupils: Pupils are equal, round, and reactive to light.  Neck:     Musculoskeletal: Normal range of motion and neck supple.  Cardiovascular:     Rate and Rhythm: Normal rate and regular rhythm.     Pulses: Normal pulses.     Heart sounds: Normal heart sounds.  Pulmonary:     Effort: Pulmonary effort is normal.     Breath sounds: Normal breath sounds.  Abdominal:     General: Abdomen is flat. Bowel sounds are normal.     Palpations: Abdomen is soft.  Musculoskeletal: Normal range of motion.  Skin:    General: Skin is warm.     Capillary Refill: Capillary refill takes less than 2 seconds.  Neurological:     General: No focal deficit present.     Mental Status: She is alert and oriented to person, place, and time.  Psychiatric:        Mood and Affect: Mood normal.        Behavior: Behavior normal.        Thought Content: Thought content normal.        Judgment: Judgment normal.      ED Treatments /  Results  Labs (all labs ordered are listed, but only abnormal results are displayed) Labs Reviewed  BASIC METABOLIC PANEL - Abnormal; Notable  for the following components:      Result Value   Glucose, Bld 155 (*)    Calcium 8.4 (*)    All other components within normal limits  URINALYSIS, ROUTINE W REFLEX MICROSCOPIC - Abnormal; Notable for the following components:   Hgb urine dipstick MODERATE (*)    Leukocytes,Ua TRACE (*)    Bacteria, UA RARE (*)    All other components within normal limits  SARS CORONAVIRUS 2 (TAT 6-24 HRS)  CBC  POC URINE PREG, ED    EKG EKG Interpretation  Date/Time:  Thursday October 12 2019 09:12:18 EST Ventricular Rate:  78 PR Interval:    QRS Duration: 100 QT Interval:  388 QTC Calculation: 442 R Axis:   62 Text Interpretation: Sinus rhythm Low voltage, precordial leads No significant change since last tracing Confirmed by Jacalyn Lefevre [phone]) on 10/12/2019 9:33:01 AM   Radiology US Renal  Result Date: 10/12/2019 CLINICAL DATA:  Right-sided flank pain for 4 days. History of kidney stones. EXAM: RENAL / URINARY TRACT ULTRASOUND COMPLETE COMPARISON:  CT, 10/10/2019 FINDINGS: Right Kidney: Renal measurements: 13.6 x 6.5 x 7.7 cm = volume: 352.2 mL. Normal parenchymal echogenicity. Moderate hydronephrosis. No mass. No stone visualized. Left Kidney: Renal measurements: 12.5 x 6.1 x 7.0 cm = volume: 278.2 mL. Normal parenchymal echogenicity. Cortical thinning. Mild hydronephrosis. 16 mm shadowing stone, lower pole. No masses. Bladder: Appears normal for degree of bladder distention. Other: None. IMPRESSION: 1. Moderate right and mild left hydronephrosis. Mild left renal cortical thinning. 2. 16 mm nonobstructing lower pole left intrarenal stone. 3. Findings are similar to the recent prior CT scan. Distal right ureteral calculi noted on the prior CT are not visualized sonographically. Electronically Signed   By: Amie Portland M.D.   On: 10/12/2019 08:54     Procedures Procedures (including critical care time)  Medications Ordered in ED Medications  sodium chloride 0.9 % bolus 1,000 mL (0 mLs Intravenous Stopped 10/12/19 0904)    And  0.9 %  sodium chloride infusion ( Intravenous New Bag/Given 10/12/19 0919)  HYDROmorphone (DILAUDID) injection 1 mg (has no administration in time range)  ondansetron (ZOFRAN) injection 4 mg (4 mg Intravenous Given 10/12/19 0753)  fentaNYL (SUBLIMAZE) injection 50 mcg (50 mcg Intravenous Given 10/12/19 0752)  fentaNYL (SUBLIMAZE) injection 100 mcg (100 mcg Intravenous Given 10/12/19 0858)  ketorolac (TORADOL) 30 MG/ML injection 30 mg (30 mg Intravenous Given 10/12/19 1041)  fentaNYL (SUBLIMAZE) injection 100 mcg (100 mcg Intravenous Given 10/12/19 1146)  HYDROmorphone (DILAUDID) injection 1 mg (1 mg Intravenous Given 10/12/19 1241)     Initial Impression / Assessment and Plan / ED Course  I have reviewed the triage vital signs and the nursing notes.  Pertinent labs & imaging results that were available during my care of the patient were reviewed by me and considered in my medical decision making (see chart for details).   Pt has had several doses of pain meds and pain is finally manageable.  Pt is nervous about going home, but urology does not feel she needs admission.    Pt d/w Dr. Mena Goes (urology) who recommended outpatient follow up.  I told him she was told there were no appointments, but he said there were and for her to call when she gets home for an appointment.  Pt is stable for d/c.  Return if worse.  Final Clinical Impressions(s) / ED Diagnoses   Final diagnoses:  Flank pain  Kidney stone    ED Discharge Orders  Ordered    ketorolac (TORADOL) 10 MG tablet  Every 6 hours PRN     10/12/19 1327    diazepam (VALIUM) 5 MG tablet  2 times daily     10/12/19 1328    ondansetron (ZOFRAN ODT) 4 MG disintegrating tablet  Every 8 hours PRN     10/12/19 1328           Jacalyn Lefevre, MD 10/12/19 1329

## 2019-10-12 NOTE — ED Triage Notes (Signed)
Pt reports being seen Monday night for kidney stones. Pt reports right sided flank pain . Placed on abt and given oxycodone. Pt is now vomiting and pain meds not helping

## 2019-12-13 ENCOUNTER — Encounter (HOSPITAL_COMMUNITY): Payer: Self-pay

## 2019-12-13 ENCOUNTER — Other Ambulatory Visit: Payer: Self-pay

## 2019-12-13 ENCOUNTER — Emergency Department (HOSPITAL_COMMUNITY)
Admission: EM | Admit: 2019-12-13 | Discharge: 2019-12-13 | Disposition: A | Discharge status: Home / Self Care | Payer: Commercial Managed Care - PPO | Attending: Emergency Medicine | Admitting: Emergency Medicine

## 2019-12-13 DIAGNOSIS — J449 Chronic obstructive pulmonary disease, unspecified: Secondary | ICD-10-CM | POA: Diagnosis not present

## 2019-12-13 DIAGNOSIS — J069 Acute upper respiratory infection, unspecified: Secondary | ICD-10-CM | POA: Insufficient documentation

## 2019-12-13 DIAGNOSIS — Z79899 Other long term (current) drug therapy: Secondary | ICD-10-CM | POA: Diagnosis not present

## 2019-12-13 DIAGNOSIS — Z20822 Contact with and (suspected) exposure to covid-19: Secondary | ICD-10-CM | POA: Insufficient documentation

## 2019-12-13 DIAGNOSIS — F17210 Nicotine dependence, cigarettes, uncomplicated: Secondary | ICD-10-CM | POA: Insufficient documentation

## 2019-12-13 DIAGNOSIS — I10 Essential (primary) hypertension: Secondary | ICD-10-CM | POA: Diagnosis not present

## 2019-12-13 DIAGNOSIS — R0981 Nasal congestion: Secondary | ICD-10-CM | POA: Diagnosis present

## 2019-12-13 LAB — SARS CORONAVIRUS 2 (TAT 6-24 HRS): SARS Coronavirus 2: NEGATIVE

## 2019-12-13 MED ORDER — ALBUTEROL SULFATE HFA 108 (90 BASE) MCG/ACT IN AERS: 2.0000 | INHALATION_SPRAY | RESPIRATORY_TRACT | 0 refills | Status: DC | PRN

## 2019-12-13 MED ORDER — HYDROCOD POLST-CPM POLST ER 10-8 MG/5ML PO SUER: 5.0000 mL | Freq: Two times a day (BID) | ORAL | 0 refills | Status: DC

## 2019-12-13 NOTE — ED Provider Notes (Signed)
[facility] EMERGENCY DEPARTMENT Provider Note   CSN: [id number] Arrival date & time: 12/13/19  6606     History Chief Complaint  Patient presents with  . Nasal Congestion    [name] is a 44 y.o. female.  Patient to ED with symptoms of nasal congestion, scratchy throat, cough that is productive. She denies fever, body aches, nausea, vomiting or diarrhea. She reports her workmate tested positive for COVID 2 days ago and she became concerned for infection when symptoms started yesterday.   The history is provided by the patient. No language interpreter was used.       Past Medical History:  Diagnosis Date  . Asthma   . COPD (chronic obstructive pulmonary disease) ([place])    sees PCP  . Depression   . GERD (gastroesophageal reflux disease)   . History of kidney stones   . Hypertension    pt. denies  never taken meds  . Pneumonia   . PONV (postoperative nausea and vomiting)     Patient Active Problem List   Diagnosis Date Noted  . Nephrolithiasis 12/29/2018  . Stone, kidney 04/15/2018  . Asthma, moderate persistent 11/12/2015  . Leukocytosis 09/25/2011  . Anxiety 09/25/2011  . Acute exacerbation of COPD with asthma ([place]) 09/24/2011  . Acute bronchitis 09/23/2011  . Aspiration of gastric contents 08/04/2011  . COPD (chronic obstructive pulmonary disease) ([place]) 08/04/2011  . Kidney stones 08/04/2011    Past Surgical History:  Procedure Laterality Date  . ABDOMINAL HYSTERECTOMY    . CHOLECYSTECTOMY    . CYSTOSCOPY W/ RETROGRADES  08/04/2011   Procedure: CYSTOSCOPY WITH RETROGRADE PYELOGRAM;  Surgeon: Marissa Nestle;  Location: [facility];  Service: Urology;  Laterality: Right;  Right Retrograde  . HOLMIUM LASER APPLICATION N/A 01/28/6009   Procedure: HOLMIUM LASER APPLICATION;  Surgeon: Ardis Hughs, MD;  Location: [facility];  Service: Urology;  Laterality: N/A;  . LITHOTRIPSY    . NEPHROLITHOTOMY Right 04/15/2018   Procedure: RIGHT NEPHROLITHOTOMY  PERCUTANEOUS WITH ACCESS;  Surgeon: Ardis Hughs, MD;  Location: [facility];  Service: Urology;  Laterality: Right;  . NEPHROLITHOTOMY Right 12/29/2018   Procedure: NEPHROLITHOTOMY PERCUTANEOUS;  Surgeon: Ardis Hughs, MD;  Location: [facility];  Service: Urology;  Laterality: Right;  . PERCUTANEOUS NEPHROLITHOTRIPSY  2012   both sides kidney   . TUBAL LIGATION       OB History    Gravida  3   Para  3   Term  3   Preterm      AB      Living  3     SAB      TAB      Ectopic      Multiple      Live Births              Family History  Problem Relation Age of Onset  . COPD Mother   . COPD Father   . Heart attack Father     Social History   Tobacco Use  . Smoking status: Current Every Day Smoker    Packs/day: 0.50  . Smokeless tobacco: Never Used  Substance Use Topics  . Alcohol use: No  . Drug use: No    Home Medications Prior to Admission medications   Medication Sig Start Date End Date Taking? Authorizing Provider  albuterol (PROVENTIL HFA;VENTOLIN HFA) 108 (90 Base) MCG/ACT inhaler Inhale 2 puffs into the lungs every 6 (six) hours as needed. For asthma Patient taking differently:  Inhale 2 puffs into the lungs every 6 (six) hours as needed for wheezing or shortness of breath.  01/17/16   Elenora Gamma, MD  cephALEXin (KEFLEX) 500 MG capsule Take 1 capsule (500 mg total) by mouth 3 (three) times daily. 10/10/19   Zadie Rhine, MD  diazepam (VALIUM) 5 MG tablet Take 1 tablet (5 mg total) by mouth 2 (two) times daily. 10/12/19   Jacalyn Lefevre, MD  ketorolac (TORADOL) 10 MG tablet Take 1 tablet (10 mg total) by mouth every 6 (six) hours as needed. 10/12/19   Jacalyn Lefevre, MD  ondansetron (ZOFRAN ODT) 4 MG disintegrating tablet Take 1 tablet (4 mg total) by mouth every 8 (eight) hours as needed. 10/12/19   Jacalyn Lefevre, MD  oxyCODONE-acetaminophen (PERCOCET) 5-325 MG tablet Take 1 tablet by mouth every 8 (eight) hours as needed. 10/10/19    Zadie Rhine, MD    Allergies    Hydrocodone, Morphine and related, and Vicodin [hydrocodone-acetaminophen]  Review of Systems   Review of Systems  Constitutional: Negative for chills and fever.  HENT: Positive for congestion and sore throat. Negative for trouble swallowing.   Respiratory: Positive for cough and chest tightness. Negative for shortness of breath.   Cardiovascular: Negative.  Negative for chest pain.  Gastrointestinal: Negative.  Negative for abdominal pain, nausea and vomiting.  Musculoskeletal: Negative.  Negative for myalgias.  Skin: Negative.   Neurological: Negative.  Negative for headaches.    Physical Exam Updated Vital Signs BP (!) 141/62 (BP Location: Right Arm)   Pulse (!) 103   Temp 98.1 F (36.7 C) (Oral)   Resp 20   Ht 5\' 6"  (1.676 m)   Wt 115.2 kg   LMP 11/05/2015   SpO2 98%   BMI 41.00 kg/m   Physical Exam  ED Results / Procedures / Treatments   Labs (all labs ordered are listed, but only abnormal results are displayed) Labs Reviewed - No data to display  EKG None  Radiology No results found.  Procedures Procedures (including critical care time)  Medications Ordered in ED Medications - No data to display  ED Course  I have reviewed the triage vital signs and the nursing notes.  Pertinent labs & imaging results that were available during my care of the patient were reviewed by me and considered in my medical decision making (see chart for details).    MDM Rules/Calculators/A&P                      Patient to ED with symptoms nasal congestion, cough, sore throat without fever or body aches. Known direct contact with COVID positive co-worker.   She is very well appearing. No SOB, hypoxia. She has a history of COPD, continuous smoker. Will collect COVID for send out.   She is low on her inhaler - will provide Rx for refill. Will also provide Tussionex for control of cough.   Final Clinical Impression(s) / ED  Diagnoses Final diagnoses:  None   1. URI 2. COVID exposure  Rx / DC Orders ED Discharge Orders    None       14/05/2015 12/13/19 1035    12/15/19, MD 12/15/19 1551

## 2019-12-13 NOTE — ED Triage Notes (Signed)
Pt reports was around a coworker that was positive for covid 2 days ago.  Pt c/o cough and nasal congestion starting this morning.

## 2019-12-13 NOTE — Discharge Instructions (Signed)
Your COVID test results will be done in 6-24 hours. Access MyChart for these results.   If positive you will need to be out of work for 10 days, returning to work if you have had no fever in the last 3 days of the 10.

## 2020-06-12 ENCOUNTER — Ambulatory Visit (INDEPENDENT_AMBULATORY_CARE_PROVIDER_SITE_OTHER): Payer: Commercial Managed Care - PPO | Admitting: Otolaryngology

## 2021-04-08 ENCOUNTER — Encounter: Payer: Self-pay | Admitting: Internal Medicine

## 2021-06-10 ENCOUNTER — Ambulatory Visit: Payer: Medicaid Other | Admitting: Gastroenterology

## 2021-06-11 ENCOUNTER — Ambulatory Visit: Payer: Commercial Managed Care - PPO | Admitting: Gastroenterology

## 2021-06-11 ENCOUNTER — Ambulatory Visit: Payer: Medicaid Other | Admitting: Internal Medicine

## 2021-06-13 ENCOUNTER — Ambulatory Visit: Payer: Self-pay | Admitting: Urology

## 2021-06-19 ENCOUNTER — Encounter: Payer: Self-pay | Admitting: Internal Medicine

## 2021-06-19 ENCOUNTER — Ambulatory Visit: Payer: Medicaid Other | Admitting: Internal Medicine

## 2021-06-30 ENCOUNTER — Ambulatory Visit: Payer: Medicaid Other | Admitting: Urology

## 2021-06-30 ENCOUNTER — Telehealth: Payer: Self-pay

## 2021-06-30 ENCOUNTER — Encounter: Payer: Self-pay | Admitting: Urology

## 2021-06-30 ENCOUNTER — Ambulatory Visit (INDEPENDENT_AMBULATORY_CARE_PROVIDER_SITE_OTHER): Payer: Medicaid Other | Admitting: Urology

## 2021-06-30 ENCOUNTER — Other Ambulatory Visit: Payer: Self-pay

## 2021-06-30 VITALS — BP 162/98 | HR 96

## 2021-06-30 DIAGNOSIS — N201 Calculus of ureter: Secondary | ICD-10-CM

## 2021-06-30 DIAGNOSIS — N29 Other disorders of kidney and ureter in diseases classified elsewhere: Secondary | ICD-10-CM

## 2021-06-30 DIAGNOSIS — N200 Calculus of kidney: Secondary | ICD-10-CM | POA: Diagnosis not present

## 2021-06-30 LAB — URINALYSIS, ROUTINE W REFLEX MICROSCOPIC
Bilirubin, UA: NEGATIVE
Glucose, UA: NEGATIVE
Ketones, UA: NEGATIVE
Nitrite, UA: NEGATIVE
Protein,UA: NEGATIVE
Specific Gravity, UA: 1.015 (ref 1.005–1.030)
Urobilinogen, Ur: 0.2 mg/dL (ref 0.2–1.0)
pH, UA: 7 (ref 5.0–7.5)

## 2021-06-30 LAB — MICROSCOPIC EXAMINATION
Epithelial Cells (non renal): >10 /hpf — AB (ref 0–10)
Renal Epithel, UA: NONE SEEN /hpf

## 2021-06-30 MED ORDER — OXYCODONE-ACETAMINOPHEN 5-325 MG PO TABS: 1.0000 | ORAL_TABLET | ORAL | 0 refills | Status: DC | PRN

## 2021-06-30 NOTE — Addendum Note (Signed)
Addended by: Jerilee Field R on: 06/30/2021 04:40 PM   Modules accepted: Orders

## 2021-06-30 NOTE — Progress Notes (Signed)
Urological Symptom Review  Patient is experiencing the following symptoms: Frequent urination Get up at night to urinate   Review of Systems  Gastrointestinal (upper)  : Nausea  Gastrointestinal (lower) : Negative for lower GI symptoms  Constitutional : Negative for symptoms  Skin: Negative for skin symptoms  Eyes: Negative for eye symptoms  Ear/Nose/Throat : Negative for Ear/Nose/Throat symptoms  Hematologic/Lymphatic: Negative for Hematologic/Lymphatic symptoms  Cardiovascular : Negative for cardiovascular symptoms  Respiratory : Negative for respiratory symptoms  Endocrine: Excessive thirst  Musculoskeletal: Negative for musculoskeletal symptoms  Neurological: Negative for neurological symptoms  Psychologic: Negative for psychiatric symptoms

## 2021-06-30 NOTE — H&P (View-Only) (Signed)
06/30/2021 1:27 PM   [name] 21-Sep-1976 [id number]  Referring provider: Tylene Fantasia., PA-C [address] [place],  [place] [address]  Chief complaint: Ureteral stones  HPI:  Renal and ureteral stones - patient is a 45 year old female with a history of stones.  She had left flank pain 05/31/2021 at [facility] which revealed 3 stones in the left ureter from proximal to mid ureter of 9 mm, 13 mm and 7 mm. There was upstream hydronephrosis.  Nephrocalcinosis was similar in both kidneys with small stones. She hasn't seen a stone pass but hasn't had a lot of pain but requested some pain meds. She has frequency. She takes tamsulosin. She drinks a lot of water.   She passed a 7 mm right distal stone seen on CT 07/20. Urine today with pH 7, no rbc, few bacteria.   She sees Dr. Marlou Porch at [facility] and had right PCNL 01/20. Her 04/19 PTH (36) and Ca (9.0) were normal. She's seen [facility] and saw Dr. Jerre Simon.   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 She stopped Dr. Collier Flowers - its been one year. Eating healthier and lost weight.   Today, seen for the above.    PMH: Past Medical History:  Diagnosis Date   Asthma    COPD (chronic obstructive pulmonary disease) (HCC)    sees PCP   Depression    GERD (gastroesophageal reflux disease)    History of kidney stones    Hypertension    pt. denies  never taken meds   Pneumonia    PONV (postoperative nausea and vomiting)     Surgical History: Past Surgical History:  Procedure Laterality Date   ABDOMINAL HYSTERECTOMY     CHOLECYSTECTOMY     CYSTOSCOPY W/ RETROGRADES  08/04/2011   Procedure: CYSTOSCOPY WITH RETROGRADE PYELOGRAM;  Surgeon: Ky Barban;  Location: [facility];  Service: Urology;  Laterality: Right;  Right Retrograde   HOLMIUM LASER APPLICATION N/A 04/15/2018   Procedure: HOLMIUM LASER APPLICATION;  Surgeon: Crist Fat, MD;  Location: [facility];  Service: Urology;  Laterality: N/A;   LITHOTRIPSY      NEPHROLITHOTOMY Right 04/15/2018   Procedure: RIGHT NEPHROLITHOTOMY PERCUTANEOUS WITH ACCESS;  Surgeon: Crist Fat, MD;  Location: [facility];  Service: Urology;  Laterality: Right;   NEPHROLITHOTOMY Right 12/29/2018   Procedure: NEPHROLITHOTOMY PERCUTANEOUS;  Surgeon: Crist Fat, MD;  Location: [facility];  Service: Urology;  Laterality: Right;   PERCUTANEOUS NEPHROLITHOTRIPSY  2012   both sides kidney    TUBAL LIGATION      Home Medications:  Allergies as of 06/30/2021       Reactions   Hydrocodone Nausea And Vomiting   Morphine And Related Hives, Other (See Comments)   Welps   Vicodin [hydrocodone-acetaminophen] Nausea And Vomiting        Medication List        Accurate as of June 30, 2021  1:27 PM. If you have any questions, ask your nurse or doctor.          STOP taking these medications    chlorpheniramine-HYDROcodone 10-8 MG/5ML Suer Commonly known as: Tussionex Pennkinetic ER Stopped by: Jerilee Field, MD   diazepam 5 MG tablet Commonly known as: VALIUM Stopped by: Jerilee Field, MD   ketorolac 10 MG tablet Commonly known as: TORADOL Stopped by: Jerilee Field, MD       TAKE these medications    albuterol 108 (90 Base) MCG/ACT inhaler Commonly  known as: VENTOLIN HFA Inhale 2 puffs into the lungs every 4 (four) hours as needed for wheezing or shortness of breath.   cephALEXin 500 MG capsule Commonly known as: KEFLEX Take 1 capsule (500 mg total) by mouth 3 (three) times daily.   ondansetron 4 MG disintegrating tablet Commonly known as: Zofran ODT Take 1 tablet (4 mg total) by mouth every 8 (eight) hours as needed.   oxyCODONE-acetaminophen 5-325 MG tablet Commonly known as: Percocet Take 1 tablet by mouth every 8 (eight) hours as needed.        Allergies:  Allergies  Allergen Reactions   Hydrocodone Nausea And Vomiting   Morphine And Related Hives and Other (See Comments)    Welps   Vicodin [Hydrocodone-Acetaminophen]  Nausea And Vomiting    Family History: Family History  Problem Relation Age of Onset   COPD Mother    COPD Father    Heart attack Father     Social History:  reports that she has been smoking. She has been smoking an average of .5 packs per day. She has never used smokeless tobacco. She reports that she does not drink alcohol and does not use drugs.   Physical Exam: LMP 11/05/2015   Constitutional:  Alert and oriented, No acute distress. HEENT: [place] AT, moist mucus membranes.  Trachea midline, no masses. Cardiovascular: No clubbing, cyanosis, or edema. Respiratory: Normal respiratory effort, no increased work of breathing. GI: Abdomen is soft, nontender, nondistended, no abdominal masses GU: No CVA tenderness Skin: No rashes, bruises or suspicious lesions. Neurologic: Grossly intact, no focal deficits, moving all 4 extremities. Psychiatric: Normal mood and affect.  Laboratory Data: Lab Results  Component Value Date   WBC 7.7 10/12/2019   HGB 13.1 10/12/2019   HCT 41.2 10/12/2019   MCV 96.3 10/12/2019   PLT 239 10/12/2019    Lab Results  Component Value Date   CREATININE 0.89 10/12/2019    No results found for: PSA  No results found for: TESTOSTERONE  Lab Results  Component Value Date   HGBA1C  01/20/2008    5.6 (NOTE)   The ADA recommends the following therapeutic goals for glycemic   control related to Hgb A1C measurement:   Goal of Therapy:   < 7.0% Hgb A1C   Action Suggested:  > 8.0% Hgb A1C   Ref:  Diabetes Care, 22, Suppl. 1, 1999    Urinalysis    Component Value Date/Time   COLORURINE YELLOW 10/12/2019 0727   APPEARANCEUR CLEAR 10/12/2019 0727   LABSPEC 1.012 10/12/2019 0727   PHURINE 6.0 10/12/2019 0727   GLUCOSEU NEGATIVE 10/12/2019 0727   HGBUR MODERATE (A) 10/12/2019 0727   BILIRUBINUR NEGATIVE 10/12/2019 0727   KETONESUR NEGATIVE 10/12/2019 0727   PROTEINUR NEGATIVE 10/12/2019 0727   UROBILINOGEN 0.2 02/12/2015 1932   NITRITE NEGATIVE  10/12/2019 0727   LEUKOCYTESUR TRACE (A) 10/12/2019 0727    Lab Results  Component Value Date   BACTERIA RARE (A) 10/12/2019    Pertinent Imaging: CT Results for orders placed during the hospital encounter of 10/09/19  DG Abdomen 1 View  Narrative CLINICAL DATA:  Pain  EXAM: ABDOMEN - 1 VIEW  COMPARISON:  None.  FINDINGS: The bowel gas pattern is normal. Moderate amount of right colonic stool. Surgical clips seen in the right upper quadrant. No radio-opaque calculi or other significant radiographic abnormality are seen.  IMPRESSION: Nonobstructive bowel gas pattern.   Electronically Signed By: Jonna Clark M.D. On: 10/10/2019 00:48  No results found for  this or any previous visit.  No results found for this or any previous visit.  No results found for this or any previous visit.  Results for orders placed during the hospital encounter of 10/12/19  US Renal  Narrative CLINICAL DATA:  Right-sided flank pain for 4 days. History of kidney stones.  EXAM: RENAL / URINARY TRACT ULTRASOUND COMPLETE  COMPARISON:  CT, 10/10/2019  FINDINGS: Right Kidney:  Renal measurements: 13.6 x 6.5 x 7.7 cm = volume: 352.2 mL. Normal parenchymal echogenicity. Moderate hydronephrosis. No mass. No stone visualized.  Left Kidney:  Renal measurements: 12.5 x 6.1 x 7.0 cm = volume: 278.2 mL. Normal parenchymal echogenicity. Cortical thinning. Mild hydronephrosis. 16 mm shadowing stone, lower pole. No masses.  Bladder:  Appears normal for degree of bladder distention.  Other:  None.  IMPRESSION: 1. Moderate right and mild left hydronephrosis. Mild left renal cortical thinning. 2. 16 mm nonobstructing lower pole left intrarenal stone. 3. Findings are similar to the recent prior CT scan. Distal right ureteral calculi noted on the prior CT are not visualized sonographically.   Electronically Signed By: Amie Portland M.D. On: 10/12/2019 08:54  No results found  for this or any previous visit.  No results found for this or any previous visit.  Results for orders placed during the hospital encounter of 10/09/19  CT Renal Stone Study  Narrative CLINICAL DATA:  Right flank pain  EXAM: CT ABDOMEN AND PELVIS WITHOUT CONTRAST  TECHNIQUE: Multidetector CT imaging of the abdomen and pelvis was performed following the standard protocol without IV contrast.  COMPARISON:  Most recent prior Apr 16, 2018  FINDINGS: Lower chest: The visualized heart size within normal limits. No pericardial fluid/thickening.  There is a moderate paraesophageal hernia.  The visualized portions of the lungs are clear.  Hepatobiliary: Although limited due to the lack of intravenous contrast, normal in appearance without gross focal abnormality. The patient is status post cholecystectomy. No biliary ductal dilation.  Pancreas:  Unremarkable.  No surrounding inflammatory changes.  Spleen: Normal in size. Although limited due to the lack of intravenous contrast, normal in appearance.  Adrenals/Urinary Tract: Both adrenal glands appear normal. Again noted is bilateral nephrocalcinosis. There is new severe right pelvicaliectasis and ureterectasis down to the level of the distal ureter where there is a 7 mm calculus. There is significant right perinephric and periureteral stranding. Within the left kidney there several renal calculi, the largest within the lower pole measuring 8 mm. There is mild proximal periureteral stranding. No distal left ureteral calculi are seen. No bladder calculi are noted.  Stomach/Bowel: The stomach, small bowel, and colon are normal in appearance. No inflammatory changes or obstructive findings. appendix is normal.  Vascular/Lymphatic: There are no enlarged abdominal or pelvic lymph nodes. No significant gross vascular findings are present.  Reproductive:  Other: No evidence of abdominal wall mass or hernia.  Musculoskeletal: No  acute or significant osseous findings.  IMPRESSION: 1. Obstructing 7 mm right distal ureteral calculus causing severe right hydronephrosis with perinephric/ureteral inflammatory changes. 2. Nonobstructing left renal calculi the largest measuring 8 mm in the lower pole with mild proximal inflammatory changes which could be due to ureteritis. 3. Findings suggestive of bilateral nephrocalcinosis.   Electronically Signed By: Jonna Clark M.D. On: 10/10/2019 01:35  Dr Marlou Porch [facility] chart and labs reviewed.   Assessment & Plan:    Left ureteral stone - will check a renal US and KUB. If hydro/stone remains discussed the nature r/b/a to cystoscopy, left ureteroscopy,  laser lithotripsy and stent placement. She reports due to her insurance she couldn't follow-up with Dr. Herrick at [facility].    Nephrocalcinosis/renal stones - as above and discussed diet changes to prevent stones.    Mariska Daffin, MD  [facility]  [address] [place],  [address] [phone]   

## 2021-06-30 NOTE — Telephone Encounter (Signed)
Patient asking for pain medication.

## 2021-06-30 NOTE — Progress Notes (Signed)
06/30/2021 1:27 PM   [name] 21-Sep-1976 [id number]  Referring provider: Tylene Fantasia., PA-C [address] [place],  [place] [address]  Chief complaint: Ureteral stones  HPI:  Renal and ureteral stones - patient is a 45 year old female with a history of stones.  She had left flank pain 05/31/2021 at [facility] which revealed 3 stones in the left ureter from proximal to mid ureter of 9 mm, 13 mm and 7 mm. There was upstream hydronephrosis.  Nephrocalcinosis was similar in both kidneys with small stones. She hasn't seen a stone pass but hasn't had a lot of pain but requested some pain meds. She has frequency. She takes tamsulosin. She drinks a lot of water.   She passed a 7 mm right distal stone seen on CT 07/20. Urine today with pH 7, no rbc, few bacteria.   She sees Dr. Marlou Porch at [facility] and had right PCNL 01/20. Her 04/19 PTH (36) and Ca (9.0) were normal. She's seen [facility] and saw Dr. Jerre Simon.   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 She stopped Dr. Collier Flowers - its been one year. Eating healthier and lost weight.   Today, seen for the above.    PMH: Past Medical History:  Diagnosis Date   Asthma    COPD (chronic obstructive pulmonary disease) (HCC)    sees PCP   Depression    GERD (gastroesophageal reflux disease)    History of kidney stones    Hypertension    pt. denies  never taken meds   Pneumonia    PONV (postoperative nausea and vomiting)     Surgical History: Past Surgical History:  Procedure Laterality Date   ABDOMINAL HYSTERECTOMY     CHOLECYSTECTOMY     CYSTOSCOPY W/ RETROGRADES  08/04/2011   Procedure: CYSTOSCOPY WITH RETROGRADE PYELOGRAM;  Surgeon: Ky Barban;  Location: [facility];  Service: Urology;  Laterality: Right;  Right Retrograde   HOLMIUM LASER APPLICATION N/A 04/15/2018   Procedure: HOLMIUM LASER APPLICATION;  Surgeon: Crist Fat, MD;  Location: [facility];  Service: Urology;  Laterality: N/A;   LITHOTRIPSY      NEPHROLITHOTOMY Right 04/15/2018   Procedure: RIGHT NEPHROLITHOTOMY PERCUTANEOUS WITH ACCESS;  Surgeon: Crist Fat, MD;  Location: [facility];  Service: Urology;  Laterality: Right;   NEPHROLITHOTOMY Right 12/29/2018   Procedure: NEPHROLITHOTOMY PERCUTANEOUS;  Surgeon: Crist Fat, MD;  Location: [facility];  Service: Urology;  Laterality: Right;   PERCUTANEOUS NEPHROLITHOTRIPSY  2012   both sides kidney    TUBAL LIGATION      Home Medications:  Allergies as of 06/30/2021       Reactions   Hydrocodone Nausea And Vomiting   Morphine And Related Hives, Other (See Comments)   Welps   Vicodin [hydrocodone-acetaminophen] Nausea And Vomiting        Medication List        Accurate as of June 30, 2021  1:27 PM. If you have any questions, ask your nurse or doctor.          STOP taking these medications    chlorpheniramine-HYDROcodone 10-8 MG/5ML Suer Commonly known as: Tussionex Pennkinetic ER Stopped by: Jerilee Field, MD   diazepam 5 MG tablet Commonly known as: VALIUM Stopped by: Jerilee Field, MD   ketorolac 10 MG tablet Commonly known as: TORADOL Stopped by: Jerilee Field, MD       TAKE these medications    albuterol 108 (90 Base) MCG/ACT inhaler Commonly  known as: VENTOLIN HFA Inhale 2 puffs into the lungs every 4 (four) hours as needed for wheezing or shortness of breath.   cephALEXin 500 MG capsule Commonly known as: KEFLEX Take 1 capsule (500 mg total) by mouth 3 (three) times daily.   ondansetron 4 MG disintegrating tablet Commonly known as: Zofran ODT Take 1 tablet (4 mg total) by mouth every 8 (eight) hours as needed.   oxyCODONE-acetaminophen 5-325 MG tablet Commonly known as: Percocet Take 1 tablet by mouth every 8 (eight) hours as needed.        Allergies:  Allergies  Allergen Reactions   Hydrocodone Nausea And Vomiting   Morphine And Related Hives and Other (See Comments)    Welps   Vicodin [Hydrocodone-Acetaminophen]  Nausea And Vomiting    Family History: Family History  Problem Relation Age of Onset   COPD Mother    COPD Father    Heart attack Father     Social History:  reports that she has been smoking. She has been smoking an average of .5 packs per day. She has never used smokeless tobacco. She reports that she does not drink alcohol and does not use drugs.   Physical Exam: LMP 11/05/2015   Constitutional:  Alert and oriented, No acute distress. HEENT: [place] AT, moist mucus membranes.  Trachea midline, no masses. Cardiovascular: No clubbing, cyanosis, or edema. Respiratory: Normal respiratory effort, no increased work of breathing. GI: Abdomen is soft, nontender, nondistended, no abdominal masses GU: No CVA tenderness Skin: No rashes, bruises or suspicious lesions. Neurologic: Grossly intact, no focal deficits, moving all 4 extremities. Psychiatric: Normal mood and affect.  Laboratory Data: Lab Results  Component Value Date   WBC 7.7 10/12/2019   HGB 13.1 10/12/2019   HCT 41.2 10/12/2019   MCV 96.3 10/12/2019   PLT 239 10/12/2019    Lab Results  Component Value Date   CREATININE 0.89 10/12/2019    No results found for: PSA  No results found for: TESTOSTERONE  Lab Results  Component Value Date   HGBA1C  01/20/2008    5.6 (NOTE)   The ADA recommends the following therapeutic goals for glycemic   control related to Hgb A1C measurement:   Goal of Therapy:   < 7.0% Hgb A1C   Action Suggested:  > 8.0% Hgb A1C   Ref:  Diabetes Care, 22, Suppl. 1, 1999    Urinalysis    Component Value Date/Time   COLORURINE YELLOW 10/12/2019 0727   APPEARANCEUR CLEAR 10/12/2019 0727   LABSPEC 1.012 10/12/2019 0727   PHURINE 6.0 10/12/2019 0727   GLUCOSEU NEGATIVE 10/12/2019 0727   HGBUR MODERATE (A) 10/12/2019 0727   BILIRUBINUR NEGATIVE 10/12/2019 0727   KETONESUR NEGATIVE 10/12/2019 0727   PROTEINUR NEGATIVE 10/12/2019 0727   UROBILINOGEN 0.2 02/12/2015 1932   NITRITE NEGATIVE  10/12/2019 0727   LEUKOCYTESUR TRACE (A) 10/12/2019 0727    Lab Results  Component Value Date   BACTERIA RARE (A) 10/12/2019    Pertinent Imaging: CT Results for orders placed during the hospital encounter of 10/09/19  DG Abdomen 1 View  Narrative CLINICAL DATA:  Pain  EXAM: ABDOMEN - 1 VIEW  COMPARISON:  None.  FINDINGS: The bowel gas pattern is normal. Moderate amount of right colonic stool. Surgical clips seen in the right upper quadrant. No radio-opaque calculi or other significant radiographic abnormality are seen.  IMPRESSION: Nonobstructive bowel gas pattern.   Electronically Signed By: Jonna Clark M.D. On: 10/10/2019 00:48  No results found for  this or any previous visit.  No results found for this or any previous visit.  No results found for this or any previous visit.  Results for orders placed during the hospital encounter of 10/12/19  US Renal  Narrative CLINICAL DATA:  Right-sided flank pain for 4 days. History of kidney stones.  EXAM: RENAL / URINARY TRACT ULTRASOUND COMPLETE  COMPARISON:  CT, 10/10/2019  FINDINGS: Right Kidney:  Renal measurements: 13.6 x 6.5 x 7.7 cm = volume: 352.2 mL. Normal parenchymal echogenicity. Moderate hydronephrosis. No mass. No stone visualized.  Left Kidney:  Renal measurements: 12.5 x 6.1 x 7.0 cm = volume: 278.2 mL. Normal parenchymal echogenicity. Cortical thinning. Mild hydronephrosis. 16 mm shadowing stone, lower pole. No masses.  Bladder:  Appears normal for degree of bladder distention.  Other:  None.  IMPRESSION: 1. Moderate right and mild left hydronephrosis. Mild left renal cortical thinning. 2. 16 mm nonobstructing lower pole left intrarenal stone. 3. Findings are similar to the recent prior CT scan. Distal right ureteral calculi noted on the prior CT are not visualized sonographically.   Electronically Signed By: Amie Portland M.D. On: 10/12/2019 08:54  No results found  for this or any previous visit.  No results found for this or any previous visit.  Results for orders placed during the hospital encounter of 10/09/19  CT Renal Stone Study  Narrative CLINICAL DATA:  Right flank pain  EXAM: CT ABDOMEN AND PELVIS WITHOUT CONTRAST  TECHNIQUE: Multidetector CT imaging of the abdomen and pelvis was performed following the standard protocol without IV contrast.  COMPARISON:  Most recent prior Apr 16, 2018  FINDINGS: Lower chest: The visualized heart size within normal limits. No pericardial fluid/thickening.  There is a moderate paraesophageal hernia.  The visualized portions of the lungs are clear.  Hepatobiliary: Although limited due to the lack of intravenous contrast, normal in appearance without gross focal abnormality. The patient is status post cholecystectomy. No biliary ductal dilation.  Pancreas:  Unremarkable.  No surrounding inflammatory changes.  Spleen: Normal in size. Although limited due to the lack of intravenous contrast, normal in appearance.  Adrenals/Urinary Tract: Both adrenal glands appear normal. Again noted is bilateral nephrocalcinosis. There is new severe right pelvicaliectasis and ureterectasis down to the level of the distal ureter where there is a 7 mm calculus. There is significant right perinephric and periureteral stranding. Within the left kidney there several renal calculi, the largest within the lower pole measuring 8 mm. There is mild proximal periureteral stranding. No distal left ureteral calculi are seen. No bladder calculi are noted.  Stomach/Bowel: The stomach, small bowel, and colon are normal in appearance. No inflammatory changes or obstructive findings. appendix is normal.  Vascular/Lymphatic: There are no enlarged abdominal or pelvic lymph nodes. No significant gross vascular findings are present.  Reproductive:  Other: No evidence of abdominal wall mass or hernia.  Musculoskeletal: No  acute or significant osseous findings.  IMPRESSION: 1. Obstructing 7 mm right distal ureteral calculus causing severe right hydronephrosis with perinephric/ureteral inflammatory changes. 2. Nonobstructing left renal calculi the largest measuring 8 mm in the lower pole with mild proximal inflammatory changes which could be due to ureteritis. 3. Findings suggestive of bilateral nephrocalcinosis.   Electronically Signed By: Jonna Clark M.D. On: 10/10/2019 01:35  Dr Marlou Porch [facility] chart and labs reviewed.   Assessment & Plan:    Left ureteral stone - will check a renal US and KUB. If hydro/stone remains discussed the nature r/b/a to cystoscopy, left ureteroscopy,  laser lithotripsy and stent placement. She reports due to her insurance she couldn't follow-up with Dr. Marlou PorchHerrick at [facility].    Nephrocalcinosis/renal stones - as above and discussed diet changes to prevent stones.    Jerilee FieldMatthew Oneil Behney, MD  [facility]  [address] [place], [place] [address] [phone]

## 2021-06-30 NOTE — Telephone Encounter (Signed)
Patient checked with pharmacy for pain medication. Nothing was called in.  Please advise.  Thanks, Rosey Bath

## 2021-07-02 ENCOUNTER — Other Ambulatory Visit: Payer: Self-pay

## 2021-07-02 ENCOUNTER — Ambulatory Visit (HOSPITAL_COMMUNITY)
Admission: RE | Admit: 2021-07-02 | Discharge: 2021-07-02 | Disposition: A | Discharge status: Home / Self Care | Payer: 59 | Source: Ambulatory Visit | Attending: Urology | Admitting: Urology

## 2021-07-02 DIAGNOSIS — N201 Calculus of ureter: Secondary | ICD-10-CM | POA: Insufficient documentation

## 2021-07-02 LAB — URINE CULTURE

## 2021-07-07 ENCOUNTER — Telehealth: Payer: Self-pay

## 2021-07-07 DIAGNOSIS — N201 Calculus of ureter: Secondary | ICD-10-CM

## 2021-07-07 MED ORDER — CEPHALEXIN 500 MG PO CAPS: 500.0000 mg | ORAL_CAPSULE | Freq: Two times a day (BID) | ORAL | 0 refills | Status: DC

## 2021-07-07 NOTE — Telephone Encounter (Signed)
-----   Message from Jerilee Field, MD sent at 07/04/2021  9:27 AM EDT ----- Looks like her stones are visible over the left sacrum and her renal US shows left hydro. Please go ahead and 1) schedule with Dr. Ronne Binning for left ureteroscopy with the green sheet I left; 2) send in cephalexin 500 mg po BID, #10 to start three days before the procedure. Thank you!    ----- Message ----- From: Ferdinand Lango, RN Sent: 07/04/2021   9:14 AM EDT To: Jerilee Field, MD  Please review

## 2021-07-07 NOTE — Telephone Encounter (Signed)
I called and discussed with patient Dr. Mena Goes findings  Surgery date  07/24/2021 confirmed with patient. Insurance PA pending.  Medication sent to pharmacy- pt aware.

## 2021-07-14 ENCOUNTER — Telehealth: Payer: Self-pay | Admitting: Urology

## 2021-07-14 NOTE — Telephone Encounter (Signed)
Returned patient call- pt reports generalize swelling to extremities and face. Denies any difficulty breathing. Encouraged patient to reach out to her PCP or nearest ER. Pt voiced understanding.

## 2021-07-14 NOTE — Telephone Encounter (Signed)
Pt called this morning and stated that her hands, feet and face are swollen. She wasn't sure what she needed to do. Please call patient when you can.

## 2021-07-17 NOTE — Patient Instructions (Signed)
[name]  07/17/2021     @PREFPERIOPPHARMACY @   Your procedure is scheduled on  07/24/2021.   [facility] to [facility] at  1130  A.M.   Call this number if you have problems the morning of surgery:  [phone]   Remember:  Do not eat or drink after midnight.      Take these medicines the morning of surgery with A SIP OF WATER                           oxycodone(if needed).     Do not wear jewelry, make-up or nail polish.  Do not wear lotions, powders, or perfumes, or deodorant.  Do not shave 48 hours prior to surgery.  Men may shave face and neck.  Do not bring valuables to the hospital.  [facility] belongings or valuables.  Contacts, dentures or bridgework may not be worn into surgery.  Leave your suitcase in the car.  After surgery it may be brought to your room.  For patients admitted to the hospital, discharge time will be determined by your treatment team.  Patients discharged the day of surgery will not be allowed to drive home and must have someone with them for 24 hours.    Special instructions:   DO NOT smoke tobacco or vape for 24 hours before your procedure.  Please read over the following fact sheets that you were given. Coughing and Deep Breathing, Surgical Site Infection Prevention, Anesthesia Post-op Instructions, and Care and Recovery After Surgery      Ureteral Stent Implantation, Care After This sheet gives you information about how to care for yourself after your procedure. Your health care provider may also give you more specific instructions. If you have problems or questions, contact your health careprovider. What can I expect after the procedure? After the procedure, it is common to have: Nausea. Mild pain when you urinate. You may feel this pain in your lower back or lower abdomen. The pain should stop within a few minutes after you urinate. This may last for up to 1 week. A small amount of blood in  your urine for several days. Follow these instructions at home: Medicines Take over-the-counter and prescription medicines only as told by your health care provider. If you were prescribed an antibiotic medicine, take it as told by your health care provider. Do not stop taking the antibiotic even if you start to feel better. Do not drive for 24 hours if you were given a sedative during your procedure. Ask your health care provider if the medicine prescribed to you requires you to avoid driving or using heavy machinery. Activity Rest as told by your health care provider. Avoid sitting for a long time without moving. Get up to take short walks every 1-2 hours. This is important to improve blood flow and breathing. Ask for help if you feel weak or unsteady. Return to your normal activities as told by your health care provider. Ask your health care provider what activities are safe for you. General instructions  Watch for any blood in your urine. Call your health care provider if the amount of blood in your urine increases. If you have a catheter: Follow instructions from your health care provider about taking care of your catheter and collection bag. Do not take baths, swim, or use a hot tub until your health care provider approves. Ask  your health care provider if you may take showers. You may only be allowed to take sponge baths. Drink enough fluid to keep your urine pale yellow. Do not use any products that contain nicotine or tobacco, such as cigarettes, e-cigarettes, and chewing tobacco. These can delay healing after surgery. If you need help quitting, ask your health care provider. Keep all follow-up visits as told by your health care provider. This is important.  Contact a health care provider if: You have pain that gets worse or does not get better with medicine, especially pain when you urinate. You have difficulty urinating. You feel nauseous or you vomit repeatedly during a period of  more than 2 days after the procedure. Get help right away if: Your urine is dark red or has blood clots in it. You are leaking urine (have incontinence). The end of the stent comes out of your urethra. You cannot urinate. You have sudden, sharp, or severe pain in your abdomen or lower back. You have a fever. You have swelling or pain in your legs. You have difficulty breathing. Summary After the procedure, it is common to have mild pain when you urinate that goes away within a few minutes after you urinate. This may last for up to 1 week. Watch for any blood in your urine. Call your health care provider if the amount of blood in your urine increases. Take over-the-counter and prescription medicines only as told by your health care provider. Drink enough fluid to keep your urine pale yellow. This information is not intended to replace advice given to you by your health care provider. Make sure you discuss any questions you have with your healthcare provider. Document Revised: 08/23/2018 Document Reviewed: 08/24/2018 Elsevier Patient Education  2022 Else[employer] Inc.  General Anesthesia, Adult, Care After This sheet gives you information about how to care for yourself after your procedure. Your health care provider may also give you more specific instructions. If you have problems or questions, contact your health careprovider. What can I expect after the procedure? After the procedure, the following side effects are common: Pain or discomfort at the IV site. Nausea. Vomiting. Sore throat. Trouble concentrating. Feeling cold or chills. Feeling weak or tired. Sleepiness and fatigue. Soreness and body aches. These side effects can affect parts of the body that were not involved in surgery. Follow these instructions at home: For the time period you were told by your health care provider:  Rest. Do not participate in activities where you could fall or become injured. Do not drive or use  machinery. Do not drink alcohol. Do not take sleeping pills or medicines that cause drowsiness. Do not make important decisions or sign legal documents. Do not take care of children on your own.  Eating and drinking Follow any instructions from your health care provider about eating or drinking restrictions. When you feel hungry, start by eating small amounts of foods that are soft and easy to digest (bland), such as toast. Gradually return to your regular diet. Drink enough fluid to keep your urine pale yellow. If you vomit, rehydrate by drinking water, juice, or clear broth. General instructions If you have sleep apnea, surgery and certain medicines can increase your risk for breathing problems. Follow instructions from your health care provider about wearing your sleep device: Anytime you are sleeping, including during daytime naps. While taking prescription pain medicines, sleeping medicines, or medicines that make you drowsy. Have a responsible adult stay with you for the time you are  told. It is important to have someone help care for you until you are awake and alert. Return to your normal activities as told by your health care provider. Ask your health care provider what activities are safe for you. Take over-the-counter and prescription medicines only as told by your health care provider. If you smoke, do not smoke without supervision. Keep all follow-up visits as told by your health care provider. This is important. Contact a health care provider if: You have nausea or vomiting that does not get better with medicine. You cannot eat or drink without vomiting. You have pain that does not get better with medicine. You are unable to pass urine. You develop a skin rash. You have a fever. You have redness around your IV site that gets worse. Get help right away if: You have difficulty breathing. You have chest pain. You have blood in your urine or stool, or you vomit  blood. Summary After the procedure, it is common to have a sore throat or nausea. It is also common to feel tired. Have a responsible adult stay with you for the time you are told. It is important to have someone help care for you until you are awake and alert. When you feel hungry, start by eating small amounts of foods that are soft and easy to digest (bland), such as toast. Gradually return to your regular diet. Drink enough fluid to keep your urine pale yellow. Return to your normal activities as told by your health care provider. Ask your health care provider what activities are safe for you. This information is not intended to replace advice given to you by your health care provider. Make sure you discuss any questions you have with your healthcare provider. Document Revised: 08/01/2020 Document Reviewed: 02/29/2020 Elsevier Patient Education  2022 Else[employer] Inc. How to Use Chlorhexidine for Bathing Chlorhexidine gluconate (CHG) is a germ-killing (antiseptic) solution that is used to clean the skin. It can get rid of the bacteria that normally live on the skin and can keep them away for about 24 hours. To clean your skin with CHG, you may be given: A CHG solution to use in the shower or as part of a sponge bath. A prepackaged cloth that contains CHG. Cleaning your skin with CHG may help lower the risk for infection: While you are staying in the intensive care unit of the hospital. If you have a vascular access, such as a central line, to provide short-term or long-term access to your veins. If you have a catheter to drain urine from your bladder. If you are on a ventilator. A ventilator is a machine that helps you breathe by moving air in and out of your lungs. After surgery. What are the risks? Risks of using CHG include: A skin reaction. Hearing loss, if CHG gets in your ears. Eye injury, if CHG gets in your eyes and is not rinsed out. The CHG product catching fire. Make sure that  you avoid smoking and flames after applying CHG to your skin. Do not use CHG: If you have a chlorhexidine allergy or have previously reacted to chlorhexidine. On babies younger than 74 months of age. How to use CHG solution Use CHG only as told by your health care provider, and follow the instructions on the label. Use the full amount of CHG as directed. Usually, this is one bottle. During a shower Follow these steps when using CHG solution during a shower (unless your health care provider gives you different  instructions): Start the shower. Use your normal soap and shampoo to wash your face and hair. Turn off the shower or move out of the shower stream. Pour the CHG onto a clean washcloth. Do not use any type of brush or rough-edged sponge. Starting at your neck, lather your body down to your toes. Make sure you follow these instructions: If you will be having surgery, pay special attention to the part of your body where you will be having surgery. Scrub this area for at least 1 minute. Do not use CHG on your head or face. If the solution gets into your ears or eyes, rinse them well with water. Avoid your genital area. Avoid any areas of skin that have broken skin, cuts, or scrapes. Scrub your back and under your arms. Make sure to wash skin folds. Let the lather sit on your skin for 1-2 minutes or as long as told by your health care provider. Thoroughly rinse your entire body in the shower. Make sure that all body creases and crevices are rinsed well. Dry off with a clean towel. Do not put any substances on your body afterward--such as powder, lotion, or perfume--unless you are told to do so by your health care provider. Only use lotions that are recommended by the manufacturer. Put on clean clothes or pajamas. If it is the night before your surgery, sleep in clean sheets.  During a sponge bath Follow these steps when using CHG solution during a sponge bath (unless your health care  provider gives you different instructions): Use your normal soap and shampoo to wash your face and hair. Pour the CHG onto a clean washcloth. Starting at your neck, lather your body down to your toes. Make sure you follow these instructions: If you will be having surgery, pay special attention to the part of your body where you will be having surgery. Scrub this area for at least 1 minute. Do not use CHG on your head or face. If the solution gets into your ears or eyes, rinse them well with water. Avoid your genital area. Avoid any areas of skin that have broken skin, cuts, or scrapes. Scrub your back and under your arms. Make sure to wash skin folds. Let the lather sit on your skin for 1-2 minutes or as long as told by your health care provider. Using a different clean, wet washcloth, thoroughly rinse your entire body. Make sure that all body creases and crevices are rinsed well. Dry off with a clean towel. Do not put any substances on your body afterward--such as powder, lotion, or perfume--unless you are told to do so by your health care provider. Only use lotions that are recommended by the manufacturer. Put on clean clothes or pajamas. If it is the night before your surgery, sleep in clean sheets. How to use CHG prepackaged cloths Only use CHG cloths as told by your health care provider, and follow the instructions on the label. Use the CHG cloth on clean, dry skin. Do not use the CHG cloth on your head or face unless your health care provider tells you to. When washing with the CHG cloth: Avoid your genital area. Avoid any areas of skin that have broken skin, cuts, or scrapes. Before surgery Follow these steps when using a CHG cloth to clean before surgery (unless your health care provider gives you different instructions): Using the CHG cloth, vigorously scrub the part of your body where you will be having surgery. Scrub using a back-and-forth motion  for 3 minutes. The area on your body  should be completely wet with CHG when you are done scrubbing. Do not rinse. Discard the cloth and let the area air-dry. Do not put any substances on the area afterward, such as powder, lotion, or perfume. Put on clean clothes or pajamas. If it is the night before your surgery, sleep in clean sheets.  For general bathing Follow these steps when using CHG cloths for general bathing (unless your health care provider gives you different instructions). Use a separate CHG cloth for each area of your body. Make sure you wash between any folds of skin and between your fingers and toes. Wash your body in the following order, switching to a new cloth after each step: The front of your neck, shoulders, and chest. Both of your arms, under your arms, and your hands. Your stomach and groin area, avoiding the genitals. Your right leg and foot. Your left leg and foot. The back of your neck, your back, and your buttocks. Do not rinse. Discard the cloth and let the area air-dry. Do not put any substances on your body afterward--such as powder, lotion, or perfume--unless you are told to do so by your health care provider. Only use lotions that are recommended by the manufacturer. Put on clean clothes or pajamas. Contact a health care provider if: Your skin gets irritated after scrubbing. You have questions about using your solution or cloth. Get help right away if: Your eyes become very red or swollen. Your eyes itch badly. Your skin itches badly and is red or swollen. Your hearing changes. You have trouble seeing. You have swelling or tingling in your mouth or throat. You have trouble breathing. You swallow any chlorhexidine. Summary Chlorhexidine gluconate (CHG) is a germ-killing (antiseptic) solution that is used to clean the skin. Cleaning your skin with CHG may help to lower your risk for infection. You may be given CHG to use for bathing. It may be in a bottle or in a prepackaged cloth to use on  your skin. Carefully follow your health care provider's instructions and the instructions on the product label. Do not use CHG if you have a chlorhexidine allergy. Contact your health care provider if your skin gets irritated after scrubbing. This information is not intended to replace advice given to you by your health care provider. Make sure you discuss any questions you have with your healthcare provider. Document Revised: 03/29/2020 Document Reviewed: 05/03/2020 Elsevier Patient Education  2022 [employer].

## 2021-07-21 ENCOUNTER — Encounter (HOSPITAL_COMMUNITY): Payer: Self-pay

## 2021-07-21 ENCOUNTER — Other Ambulatory Visit: Payer: Self-pay

## 2021-07-21 ENCOUNTER — Encounter (HOSPITAL_COMMUNITY)
Admission: RE | Admit: 2021-07-21 | Discharge: 2021-07-21 | Disposition: A | Discharge status: Home / Self Care | Payer: 59 | Source: Ambulatory Visit | Attending: Urology | Admitting: Urology

## 2021-07-21 DIAGNOSIS — Z01810 Encounter for preprocedural cardiovascular examination: Secondary | ICD-10-CM | POA: Diagnosis present

## 2021-07-24 ENCOUNTER — Other Ambulatory Visit: Payer: Self-pay

## 2021-07-24 ENCOUNTER — Ambulatory Visit (HOSPITAL_COMMUNITY)
Admission: RE | Admit: 2021-07-24 | Discharge: 2021-07-24 | Disposition: A | Discharge status: Home / Self Care | Payer: 59 | Attending: Urology | Admitting: Urology

## 2021-07-24 ENCOUNTER — Ambulatory Visit (HOSPITAL_COMMUNITY): Payer: 59 | Admitting: Anesthesiology

## 2021-07-24 ENCOUNTER — Encounter (HOSPITAL_COMMUNITY): Payer: Self-pay | Admitting: Urology

## 2021-07-24 ENCOUNTER — Ambulatory Visit (HOSPITAL_COMMUNITY): Payer: 59

## 2021-07-24 ENCOUNTER — Encounter (HOSPITAL_COMMUNITY)
Admission: RE | Disposition: A | Discharge status: Home / Self Care | Payer: Self-pay | Source: Home / Self Care | Attending: Urology

## 2021-07-24 DIAGNOSIS — Z8249 Family history of ischemic heart disease and other diseases of the circulatory system: Secondary | ICD-10-CM | POA: Insufficient documentation

## 2021-07-24 DIAGNOSIS — N132 Hydronephrosis with renal and ureteral calculous obstruction: Secondary | ICD-10-CM | POA: Diagnosis not present

## 2021-07-24 DIAGNOSIS — Z87442 Personal history of urinary calculi: Secondary | ICD-10-CM | POA: Diagnosis not present

## 2021-07-24 DIAGNOSIS — Z87891 Personal history of nicotine dependence: Secondary | ICD-10-CM | POA: Diagnosis not present

## 2021-07-24 DIAGNOSIS — Z885 Allergy status to narcotic agent status: Secondary | ICD-10-CM | POA: Diagnosis not present

## 2021-07-24 DIAGNOSIS — Z825 Family history of asthma and other chronic lower respiratory diseases: Secondary | ICD-10-CM | POA: Insufficient documentation

## 2021-07-24 DIAGNOSIS — N201 Calculus of ureter: Secondary | ICD-10-CM | POA: Diagnosis not present

## 2021-07-24 HISTORY — PX: CYSTOSCOPY WITH RETROGRADE PYELOGRAM, URETEROSCOPY AND STENT PLACEMENT: SHX5789

## 2021-07-24 HISTORY — PX: HOLMIUM LASER APPLICATION: SHX5852

## 2021-07-24 SURGERY — CYSTOURETEROSCOPY, WITH RETROGRADE PYELOGRAM AND STENT INSERTION
Anesthesia: General | Site: Ureter | Laterality: Left

## 2021-07-24 MED ORDER — FENTANYL CITRATE (PF) 100 MCG/2ML IJ SOLN
INTRAMUSCULAR | Status: AC
  Filled 2021-07-24: qty 2

## 2021-07-24 MED ORDER — ONDANSETRON 4 MG PO TBDP: 4.0000 mg | ORAL_TABLET | Freq: Three times a day (TID) | ORAL | 0 refills | Status: DC | PRN

## 2021-07-24 MED ORDER — ORAL CARE MOUTH RINSE: 15.0000 mL | Freq: Once | OROMUCOSAL | Status: AC

## 2021-07-24 MED ORDER — ONDANSETRON HCL 4 MG/2ML IJ SOLN
INTRAMUSCULAR | Status: AC
  Filled 2021-07-24: qty 2

## 2021-07-24 MED ORDER — DEXAMETHASONE SODIUM PHOSPHATE 10 MG/ML IJ SOLN
INTRAMUSCULAR | Status: AC
  Filled 2021-07-24: qty 1

## 2021-07-24 MED ORDER — PROPOFOL 10 MG/ML IV BOLUS
INTRAVENOUS | Status: DC | PRN
  Administered 2021-07-24: 200 mg via INTRAVENOUS

## 2021-07-24 MED ORDER — LIDOCAINE HCL (PF) 2 % IJ SOLN
INTRAMUSCULAR | Status: AC
  Filled 2021-07-24: qty 5

## 2021-07-24 MED ORDER — OXYCODONE-ACETAMINOPHEN 5-325 MG PO TABS: 1.0000 | ORAL_TABLET | ORAL | 0 refills | Status: DC | PRN

## 2021-07-24 MED ORDER — MIDAZOLAM HCL 2 MG/2ML IJ SOLN
INTRAMUSCULAR | Status: AC
  Filled 2021-07-24: qty 2

## 2021-07-24 MED ORDER — PROPOFOL 10 MG/ML IV BOLUS
INTRAVENOUS | Status: AC
  Filled 2021-07-24: qty 20

## 2021-07-24 MED ORDER — DIATRIZOATE MEGLUMINE 30 % UR SOLN
URETHRAL | Status: DC | PRN
  Administered 2021-07-24: 8 mL via URETHRAL

## 2021-07-24 MED ORDER — LACTATED RINGERS IV SOLN: INTRAVENOUS | Status: DC

## 2021-07-24 MED ORDER — DEXAMETHASONE SODIUM PHOSPHATE 4 MG/ML IJ SOLN
INTRAMUSCULAR | Status: DC | PRN
  Administered 2021-07-24: 10 mg via INTRAVENOUS

## 2021-07-24 MED ORDER — MEPERIDINE HCL 50 MG/ML IJ SOLN
6.2500 mg | INTRAMUSCULAR | Status: DC | PRN
  Administered 2021-07-24 (×2): 12.5 mg via INTRAVENOUS
  Filled 2021-07-24: qty 1

## 2021-07-24 MED ORDER — ONDANSETRON HCL 4 MG/2ML IJ SOLN
INTRAMUSCULAR | Status: DC | PRN
  Administered 2021-07-24: 4 mg via INTRAVENOUS

## 2021-07-24 MED ORDER — ONDANSETRON HCL 4 MG/2ML IJ SOLN
4.0000 mg | Freq: Once | INTRAMUSCULAR | Status: AC | PRN
  Administered 2021-07-24: 4 mg via INTRAVENOUS
  Filled 2021-07-24: qty 2

## 2021-07-24 MED ORDER — SODIUM CHLORIDE 0.9 % IR SOLN
Status: DC | PRN
  Administered 2021-07-24 (×2): 3000 mL

## 2021-07-24 MED ORDER — LIDOCAINE HCL (CARDIAC) PF 100 MG/5ML IV SOSY
PREFILLED_SYRINGE | INTRAVENOUS | Status: DC | PRN
  Administered 2021-07-24: 100 mg via INTRAVENOUS

## 2021-07-24 MED ORDER — FENTANYL CITRATE (PF) 100 MCG/2ML IJ SOLN
INTRAMUSCULAR | Status: DC | PRN
  Administered 2021-07-24: 100 ug via INTRAVENOUS
  Administered 2021-07-24 (×4): 50 ug via INTRAVENOUS

## 2021-07-24 MED ORDER — CHLORHEXIDINE GLUCONATE 0.12 % MT SOLN
15.0000 mL | Freq: Once | OROMUCOSAL | Status: AC
  Administered 2021-07-24: 15 mL via OROMUCOSAL
  Filled 2021-07-24: qty 15

## 2021-07-24 MED ORDER — WATER FOR IRRIGATION, STERILE IR SOLN
Status: DC | PRN
  Administered 2021-07-24: 500 mL

## 2021-07-24 MED ORDER — SUCCINYLCHOLINE CHLORIDE 200 MG/10ML IV SOSY
PREFILLED_SYRINGE | INTRAVENOUS | Status: AC
  Filled 2021-07-24: qty 10

## 2021-07-24 MED ORDER — CEFAZOLIN IN SODIUM CHLORIDE 3-0.9 GM/100ML-% IV SOLN
3.0000 g | INTRAVENOUS | Status: AC
  Administered 2021-07-24: 3 g via INTRAVENOUS
  Filled 2021-07-24: qty 100

## 2021-07-24 MED ORDER — MIDAZOLAM HCL 5 MG/5ML IJ SOLN
INTRAMUSCULAR | Status: DC | PRN
  Administered 2021-07-24: 2 mg via INTRAVENOUS

## 2021-07-24 MED ORDER — FENTANYL CITRATE PF 50 MCG/ML IJ SOSY: 25.0000 ug | PREFILLED_SYRINGE | INTRAMUSCULAR | Status: DC | PRN

## 2021-07-24 SURGICAL SUPPLY — 25 items
BAG DRAIN URO TABLE W/ADPT NS (BAG) ×2 IMPLANT
BAG DRN 8 ADPR NS SKTRN CSTL (BAG) ×1
BAG HAMPER (MISCELLANEOUS) ×2 IMPLANT
CATH INTERMIT  6FR 70CM (CATHETERS) ×2 IMPLANT
CLOTH BEACON ORANGE TIMEOUT ST (SAFETY) ×2 IMPLANT
DECANTER SPIKE VIAL GLASS SM (MISCELLANEOUS) ×2 IMPLANT
EXTRACTOR STONE NITINOL NGAGE (UROLOGICAL SUPPLIES) ×2 IMPLANT
GLOVE SURG POLYISO LF SZ8 (GLOVE) ×2 IMPLANT
GLOVE SURG UNDER POLY LF SZ7 (GLOVE) ×4 IMPLANT
GOWN STRL REUS W/TWL LRG LVL3 (GOWN DISPOSABLE) ×2 IMPLANT
GOWN STRL REUS W/TWL XL LVL3 (GOWN DISPOSABLE) ×2 IMPLANT
GUIDEWIRE STR DUAL SENSOR (WIRE) ×2 IMPLANT
GUIDEWIRE STR ZIPWIRE 035X150 (MISCELLANEOUS) ×2 IMPLANT
IV NS IRRIG 3000ML ARTHROMATIC (IV SOLUTION) ×4 IMPLANT
KIT TURNOVER CYSTO (KITS) ×2 IMPLANT
MANIFOLD NEPTUNE II (INSTRUMENTS) ×2 IMPLANT
PACK CYSTO (CUSTOM PROCEDURE TRAY) ×2 IMPLANT
PAD ARMBOARD 7.5X6 YLW CONV (MISCELLANEOUS) ×2 IMPLANT
STENT URET 6FRX26 CONTOUR (STENTS) ×2 IMPLANT
SYR 10ML LL (SYRINGE) ×2 IMPLANT
SYR CONTROL 10ML LL (SYRINGE) ×2 IMPLANT
TOWEL OR 17X26 4PK STRL BLUE (TOWEL DISPOSABLE) ×2 IMPLANT
TRACTIP FLEXIVA PULS ID 200XHI (Laser) ×1 IMPLANT
TRACTIP FLEXIVA PULSE ID 200 (Laser) ×2
WATER STERILE IRR 500ML POUR (IV SOLUTION) ×2 IMPLANT

## 2021-07-24 NOTE — Transfer of Care (Signed)
Immediate Anesthesia Transfer of Care Note  Patient: [name]  Procedure(s) Performed: CYSTOSCOPY WITH RETROGRADE PYELOGRAM, URETEROSCOPY AND STENT PLACEMENT (Left: Ureter) HOLMIUM LASER APPLICATION (Left: Ureter)  Patient Location: PACU  Anesthesia Type:General  Level of Consciousness: drowsy  Airway & Oxygen Therapy: Patient Spontanous Breathing and Patient connected to nasal cannula oxygen  Post-op Assessment: Report given to RN and Post -op Vital signs reviewed and stable  Post vital signs: Reviewed and stable  Last Vitals:  Vitals Value Taken Time  BP 146/82 07/24/21 1254  Temp    Pulse 99 07/24/21 1255  Resp 17 07/24/21 1255  SpO2 98 % 07/24/21 1255  Vitals shown include unvalidated device data.  Last Pain:  Vitals:   07/24/21 1027  TempSrc: Oral  PainSc: 0-No pain         Complications: No notable events documented.

## 2021-07-24 NOTE — Anesthesia Procedure Notes (Signed)
Procedure Name: Intubation Date/Time: 07/24/2021 11:49 AM Performed by: Junious Silk, CRNA Pre-anesthesia Checklist: Patient identified, Emergency Drugs available, Suction available, Patient being monitored and Timeout performed Patient Re-evaluated:Patient Re-evaluated prior to induction Oxygen Delivery Method: Circle system utilized Preoxygenation: Pre-oxygenation with 100% oxygen Induction Type: IV induction Laryngoscope Size: Miller and 2 Grade View: Grade I Tube type: Oral Tube size: 7.0 mm Number of attempts: 1 Airway Equipment and Method: Stylet Placement Confirmation: ETT inserted through vocal cords under direct vision, positive ETCO2, CO2 detector and breath sounds checked- equal and bilateral Secured at: 22 cm Tube secured with: Tape Dental Injury: Teeth and Oropharynx as per pre-operative assessment

## 2021-07-24 NOTE — Anesthesia Preprocedure Evaluation (Signed)
Anesthesia Evaluation  Patient identified by MRN, date of birth, ID band Patient awake    Reviewed: Allergy & Precautions, NPO status , Patient's Chart, lab work & pertinent test results  History of Anesthesia Complications (+) PONV and history of anesthetic complications  Airway Mallampati: II  TM Distance: >3 FB Neck ROM: Full    Dental  (+) Dental Advisory Given, Teeth Intact   Pulmonary asthma , pneumonia, COPD,  COPD inhaler, former smoker,    Pulmonary exam normal breath sounds clear to auscultation       Cardiovascular Exercise Tolerance: Good hypertension, Pt. on medications Normal cardiovascular exam Rhythm:Regular Rate:Normal     Neuro/Psych    GI/Hepatic GERD  Medicated and Controlled,  Endo/Other  Morbid obesity  Renal/GU Renal disease     Musculoskeletal negative musculoskeletal ROS (+)   Abdominal   Peds  Hematology negative hematology ROS (+)   Anesthesia Other Findings   Reproductive/Obstetrics negative OB ROS                             Anesthesia Physical Anesthesia Plan  ASA: 3  Anesthesia Plan: General   Post-op Pain Management:    Induction: Intravenous  PONV Risk Score and Plan: 4 or greater and Ondansetron, Dexamethasone and Midazolam  Airway Management Planned: Oral ETT  Additional Equipment:   Intra-op Plan:   Post-operative Plan: Extubation in OR  Informed Consent: I have reviewed the patients History and Physical, chart, labs and discussed the procedure including the risks, benefits and alternatives for the proposed anesthesia with the patient or authorized representative who has indicated his/her understanding and acceptance.       Plan Discussed with: CRNA and Surgeon  Anesthesia Plan Comments:         Anesthesia Quick Evaluation

## 2021-07-24 NOTE — Interval H&P Note (Signed)
History and Physical Interval Note:  07/24/2021 11:19 AM  [name]  has presented today for surgery, with the diagnosis of left ureteral stone/left hydronephrosis.  The various methods of treatment have been discussed with the patient and family. After consideration of risks, benefits and other options for treatment, the patient has consented to  Procedure(s): CYSTOSCOPY WITH RETROGRADE PYELOGRAM, URETEROSCOPY AND STENT PLACEMENT (Left) HOLMIUM LASER APPLICATION (Left) as a surgical intervention.  The patient's history has been reviewed, patient examined, no change in status, stable for surgery.  I have reviewed the patient's chart and labs.  Questions were answered to the patient's satisfaction.     [name]

## 2021-07-24 NOTE — Anesthesia Postprocedure Evaluation (Signed)
Anesthesia Post Note  Patient: [name]  Procedure(s) Performed: CYSTOSCOPY WITH RETROGRADE PYELOGRAM, URETEROSCOPY AND STENT PLACEMENT (Left: Ureter) HOLMIUM LASER APPLICATION (Left: Ureter)  Patient location during evaluation: PACU Anesthesia Type: General Level of consciousness: awake and alert and oriented Pain management: pain level controlled Vital Signs Assessment: post-procedure vital signs reviewed and stable Respiratory status: spontaneous breathing and respiratory function stable Cardiovascular status: blood pressure returned to baseline and stable Postop Assessment: no apparent nausea or vomiting Anesthetic complications: no   No notable events documented.   Last Vitals:  Vitals:   07/24/21 1400 07/24/21 1407  BP: 133/72 (!) 156/85  Pulse: 97 84  Resp: 14 16  Temp:  (!) 36.2 C  SpO2: 96% 96%    Last Pain:  Vitals:   07/24/21 1407  TempSrc: Oral  PainSc: 8                  Dula Havlik C Valor Quaintance

## 2021-07-24 NOTE — Op Note (Signed)
.  Preoperative diagnosis: Left ureteral stone  Postoperative diagnosis: Same  Procedure: 1 cystoscopy 2. Left retrograde pyelography 3.  Intraoperative fluoroscopy, under one hour, with interpretation 4.  Left ureteroscopic stone manipulation with laser lithotripsy 5.  Left 6 x 26 JJ stent placement  Attending: Cleda Mccreedy  Anesthesia: General  Estimated blood loss: None  Drains: Left 6 x 26 JJ ureteral stent without tether  Specimens: stone for analysis  Antibiotics: ancef  Findings: multiple left proximal impacted ureteral stone. Moderate hydronephrosis. No masses/lesions in the bladder. Ureteral orifices in normal anatomic location.  Indications: Patient is a 45 year old female with a history of left ureteral stones who has failed medical expulsive therapy.  After discussing treatment options, she decided proceed with left ureteroscopic stone manipulation.  Procedure in detail: The patient was brought to the operating room and a brief timeout was done to ensure correct patient, correct procedure, correct site.  General anesthesia was administered patient was placed in dorsal lithotomy position.  Her genitalia was then prepped and draped in usual sterile fashion.  A rigid 22 French cystoscope was passed in the urethra and the bladder.  Bladder was inspected free masses or lesions.  the ureteral orifices were in the normal orthotopic locations.  a 6 french ureteral catheter was then instilled into the left ureteral orifice.  a gentle retrograde was obtained and findings noted above.  we then placed a zip wire through the ureteral catheter and advanced up to the renal pelvis.  we then removed the cystoscope and cannulated the left ureteral orifice with a semirigid ureteroscope.  We encountered multiple impacted ureteral calculi in the proximal ureter. using a 242 nm laser fiber and fragmented the stone into smaller pieces.  the pieces were then removed with a Ngage basket.  Once all  stone fragments were removed we then placed a 6 x 26 double-j ureteral stent over the original zip wire. We then removed the wire and good coil was noted in the the renal pelvis under fluoroscopy and the bladder under direct vision.    the stone fragments were then removed from the bladder and sent for analysis.   the bladder was then drained and this concluded the procedure which was well tolerated by patient.  Complications: None  Condition: Stable, extubated, transferred to PACU  Plan: Patient is to be discharged home as to follow-up in one week for stent removal.

## 2021-07-25 ENCOUNTER — Encounter (HOSPITAL_COMMUNITY): Payer: Self-pay | Admitting: Urology

## 2021-07-29 LAB — CALCULI, WITH PHOTOGRAPH (CLINICAL LAB)
Calcium Oxalate Dihydrate: 20 %
Calcium Oxalate Monohydrate: 40 %
Hydroxyapatite: 40 %
Weight Calculi: 105 mg

## 2021-07-31 ENCOUNTER — Telehealth: Payer: Self-pay

## 2021-07-31 NOTE — Telephone Encounter (Signed)
Patient called complaining of severe pain and discomfort in her vagina. Patient states she can not control her urine flow and she is seeing blood. Patient informed that discomfort and pain especially while urinating can be expected. Pain, pressure or a burning sensation during urination or when you move can be expected as well. Bladder muscle spasms even leakage of urine can occur. Patient states that she has had several stent placements and she has never had this kind of pain and feels something is not right. Patient informed if her pain is unbearable to go to ER for eval. Patient voiced understanding.

## 2021-08-06 ENCOUNTER — Other Ambulatory Visit: Payer: Self-pay

## 2021-08-06 ENCOUNTER — Ambulatory Visit (INDEPENDENT_AMBULATORY_CARE_PROVIDER_SITE_OTHER): Payer: 59 | Admitting: Urology

## 2021-08-06 VITALS — BP 133/80 | HR 85

## 2021-08-06 DIAGNOSIS — N201 Calculus of ureter: Secondary | ICD-10-CM | POA: Diagnosis not present

## 2021-08-06 LAB — URINALYSIS, ROUTINE W REFLEX MICROSCOPIC
Bilirubin, UA: NEGATIVE
Glucose, UA: NEGATIVE
Ketones, UA: NEGATIVE
Nitrite, UA: NEGATIVE
Specific Gravity, UA: 1.02 (ref 1.005–1.030)
Urobilinogen, Ur: 0.2 mg/dL (ref 0.2–1.0)
pH, UA: 7 (ref 5.0–7.5)

## 2021-08-06 LAB — MICROSCOPIC EXAMINATION
Epithelial Cells (non renal): >10 /hpf — AB (ref 0–10)
Renal Epithel, UA: NONE SEEN /hpf
WBC, UA: >30 /hpf — AB (ref 0–5)

## 2021-08-06 MED ORDER — POTASSIUM CITRATE ER 15 MEQ (1620 MG) PO TBCR: 1.0000 | EXTENDED_RELEASE_TABLET | Freq: Two times a day (BID) | ORAL | 11 refills | Status: DC

## 2021-08-06 MED ORDER — CIPROFLOXACIN HCL 500 MG PO TABS
500.0000 mg | ORAL_TABLET | Freq: Once | ORAL | Status: AC
  Administered 2021-08-06: 500 mg via ORAL

## 2021-08-06 NOTE — Progress Notes (Signed)
   08/06/21  CC: nephrolithiasis   HPI: Ms Rathgeber is a 45yo here for left ureteral stent removal Blood pressure 133/80, pulse 85, last menstrual period 11/05/2015. NED. A&Ox3.   No respiratory distress   Abd soft, NT, ND Normal external genitalia with patent urethral meatus  Cystoscopy Procedure Note  Patient identification was confirmed, informed consent was obtained, and patient was prepped using Betadine solution.  Lidocaine jelly was administered per urethral meatus.    Procedure: - Flexible cystoscope introduced, without any difficulty.   - Thorough search of the bladder revealed:    normal urethral meatus    normal urothelium    no stones    no ulcers     no tumors    no urethral polyps    no trabeculation  - Ureteral orifices were normal in position and appearance. Using a grasper the left ureteral stent was removed intact  Post-Procedure: - Patient tolerated the procedure well  Assessment/ Plan: We will start UrocitK BID -RTC 1 month with a renal US   No follow-ups on file.  [name], MD

## 2021-08-06 NOTE — Progress Notes (Signed)
Urological Symptom Review  Patient is experiencing the following symptoms: Frequent urination Burning/pain with urination Get up at night to urinate Blood in urine Kidney stones   Review of Systems  Gastrointestinal (upper)  : Negative for upper GI symptoms  Gastrointestinal (lower) : Negative for lower GI symptoms  Constitutional : Negative for symptoms  Skin: Negative for skin symptoms  Eyes: Blurred vision  Ear/Nose/Throat : Negative for Ear/Nose/Throat symptoms  Hematologic/Lymphatic: Negative for Hematologic/Lymphatic symptoms  Cardiovascular : Negative for cardiovascular symptoms  Respiratory : Negative for respiratory symptoms  Endocrine: Excessive thirst  Musculoskeletal: Negative for musculoskeletal symptoms  Neurological: Negative for neurological symptoms  Psychologic: Negative for psychiatric symptoms

## 2021-08-06 NOTE — Patient Instructions (Signed)
Dietary Guidelines to Help Prevent Kidney Stones Kidney stones are deposits of minerals and salts that form inside your kidneys. Your risk of developing kidney stones may be greater depending on your diet, your lifestyle, the medicines you take, and whether you have certain medical conditions. Most people can lower their chances of developing kidney stones by following the instructions below. Your dietitian may give you more specific instructions depending on your overall health and the type of kidney stones you tend to develop. What are tips for following this plan? Reading food labels  Choose foods with "no salt added" or "low-salt" labels. Limit your salt (sodium) intake to less than 1,500 mg a day. Choose foods with calcium for each meal and snack. Try to eat about 300 mg of calcium at each meal. Foods that contain 200-500 mg of calcium a serving include: 8 oz (237 mL) of milk, calcium-fortifiednon-dairy milk, and calcium-fortifiedfruit juice. Calcium-fortified means that calcium has been added to these drinks. 8 oz (237 mL) of kefir, yogurt, and soy yogurt. 4 oz (114 g) of tofu. 1 oz (28 g) of cheese. 1 cup (150 g) of dried figs. 1 cup (91 g) of cooked broccoli. One 3 oz (85 g) can of sardines or mackerel. Most people need 1,000-1,500 mg of calcium a day. Talk to your dietitian about how much calcium is recommended for you. Shopping Buy plenty of fresh fruits and vegetables. Most people do not need to avoid fruits and vegetables, even if these foods contain nutrients that may contribute to kidney stones. When shopping for convenience foods, choose: Whole pieces of fruit. Pre-made salads with dressing on the side. Low-fat fruit and yogurt smoothies. Avoid buying frozen meals or prepared deli foods. These can be high in sodium. Look for foods with live cultures, such as yogurt and kefir. Choose high-fiber grains, such as whole-wheat breads, oat bran, and wheat cereals. Cooking Do not add  salt to food when cooking. Place a salt shaker on the table and allow each person to add his or her own salt to taste. Use vegetable protein, such as beans, textured vegetable protein (TVP), or tofu, instead of meat in pasta, casseroles, and soups. Meal planning Eat less salt, if told by your dietitian. To do this: Avoid eating processed or pre-made food. Avoid eating fast food. Eat less animal protein, including cheese, meat, poultry, or fish, if told by your dietitian. To do this: Limit the number of times you have meat, poultry, fish, or cheese each week. Eat a diet free of meat at least 2 days a week. Eat only one serving each day of meat, poultry, fish, or seafood. When you prepare animal protein, cut pieces into small portion sizes. For most meat and fish, one serving is about the size of the palm of your hand. Eat at least five servings of fresh fruits and vegetables each day. To do this: Keep fruits and vegetables on hand for snacks. Eat one piece of fruit or a handful of berries with breakfast. Have a salad and fruit at lunch. Have two kinds of vegetables at dinner. Limit foods that are high in a substance called oxalate. These include: Spinach (cooked), rhubarb, beets, sweet potatoes, and Swiss chard. Peanuts. Potato chips, french fries, and baked potatoes with skin on. Nuts and nut products. Chocolate. If you regularly take a diuretic medicine, make sure to eat at least 1 or 2 servings of fruits or vegetables that are high in potassium each day. These include: Avocado. Banana. Orange, prune,   carrot, or tomato juice. Baked potato. Cabbage. Beans and split peas. Lifestyle  Drink enough fluid to keep your urine pale yellow. This is the most important thing you can do. Spread your fluid intake throughout the day. If you drink alcohol: Limit how much you use to: 0-1 drink a day for women who are not pregnant. 0-2 drinks a day for men. Be aware of how much alcohol is in your  drink. In the [place] one drink equals one 12 oz bottle of beer (355 mL), one 5 oz glass of wine (148 mL), or one 1 oz glass of hard liquor (44 mL). Lose weight if told by your health care provider. Work with your dietitian to find an eating plan and weight loss strategies that work best for you. General information Talk to your health care provider and dietitian about taking daily supplements. You may be told the following depending on your health and the cause of your kidney stones: Not to take supplements with vitamin C. To take a calcium supplement. To take a daily probiotic supplement. To take other supplements such as magnesium, fish oil, or vitamin B6. Take over-the-counter and prescription medicines only as told by your health care provider. These include supplements. What foods should I limit? Limit your intake of the following foods, or eat them as told by your dietitian. Vegetables Spinach. Rhubarb. Beets. Canned vegetables. Pickles. Olives. Baked potatoes with skin. Grains Wheat bran. Baked goods. Salted crackers. Cereals high in sugar. Meats and other proteins Nuts. Nut butters. Large portions of meat, poultry, or fish. Salted, precooked, or cured meats, such as sausages, meat loaves, and hot dogs. Dairy Cheese. Beverages Regular soft drinks. Regular vegetable juice. Seasonings and condiments Seasoning blends with salt. Salad dressings. Soy sauce. Ketchup. Barbecue sauce. Other foods Canned soups. Canned pasta sauce. Casseroles. Pizza. Lasagna. Frozen meals. Potato chips. French fries. The items listed above may not be a complete list of foods and beverages you should limit. Contact a dietitian for more information. What foods should I avoid? Talk to your dietitian about specific foods you should avoid based on the type of kidney stones you have and your overall health. Fruits Grapefruit. The item listed above may not be a complete list of foods and beverages you should  avoid. Contact a dietitian for more information. Summary Kidney stones are deposits of minerals and salts that form inside your kidneys. You can lower your risk of kidney stones by making changes to your diet. The most important thing you can do is drink enough fluid. Drink enough fluid to keep your urine pale yellow. Talk to your dietitian about how much calcium you should have each day, and eat less salt and animal protein as told by your dietitian. This information is not intended to replace advice given to you by your health care provider. Make sure you discuss any questions you have with your health care provider. Document Revised: 11/09/2019 Document Reviewed: 11/09/2019 Elsevier Patient Education  2022 Elsevier Inc.  

## 2021-08-18 ENCOUNTER — Ambulatory Visit: Payer: Medicaid Other | Admitting: Urology

## 2021-08-22 ENCOUNTER — Other Ambulatory Visit: Payer: Self-pay

## 2021-08-22 ENCOUNTER — Other Ambulatory Visit: Payer: 59

## 2021-08-22 DIAGNOSIS — N201 Calculus of ureter: Secondary | ICD-10-CM

## 2021-08-23 LAB — BASIC METABOLIC PANEL
BUN/Creatinine Ratio: 12 (ref 9–23)
BUN: 9 mg/dL (ref 6–24)
CO2: 21 mmol/L (ref 20–29)
Calcium: 9 mg/dL (ref 8.7–10.2)
Chloride: 103 mmol/L (ref 96–106)
Creatinine, Ser: 0.76 mg/dL (ref 0.57–1.00)
Glucose: 124 mg/dL — ABNORMAL HIGH (ref 65–99)
Potassium: 4 mmol/L (ref 3.5–5.2)
Sodium: 138 mmol/L (ref 134–144)
eGFR: 97 mL/min/{1.73_m2} (ref >59)

## 2021-08-28 ENCOUNTER — Ambulatory Visit (HOSPITAL_COMMUNITY)
Admission: RE | Admit: 2021-08-28 | Discharge: 2021-08-28 | Disposition: A | Discharge status: Home / Self Care | Payer: 59 | Source: Ambulatory Visit | Attending: Urology | Admitting: Urology

## 2021-08-28 ENCOUNTER — Other Ambulatory Visit: Payer: Self-pay

## 2021-08-28 DIAGNOSIS — N201 Calculus of ureter: Secondary | ICD-10-CM | POA: Insufficient documentation

## 2021-09-12 ENCOUNTER — Encounter: Payer: Self-pay | Admitting: Urology

## 2021-09-12 ENCOUNTER — Ambulatory Visit (INDEPENDENT_AMBULATORY_CARE_PROVIDER_SITE_OTHER): Payer: 59 | Admitting: Urology

## 2021-09-12 ENCOUNTER — Other Ambulatory Visit: Payer: Self-pay

## 2021-09-12 VITALS — BP 149/85 | HR 91 | Temp 98.1°F

## 2021-09-12 DIAGNOSIS — N29 Other disorders of kidney and ureter in diseases classified elsewhere: Secondary | ICD-10-CM

## 2021-09-12 DIAGNOSIS — N201 Calculus of ureter: Secondary | ICD-10-CM | POA: Diagnosis not present

## 2021-09-12 DIAGNOSIS — N200 Calculus of kidney: Secondary | ICD-10-CM | POA: Diagnosis not present

## 2021-09-12 LAB — URINALYSIS, ROUTINE W REFLEX MICROSCOPIC
Bilirubin, UA: NEGATIVE
Glucose, UA: NEGATIVE
Ketones, UA: NEGATIVE
Leukocytes,UA: NEGATIVE
Nitrite, UA: NEGATIVE
Protein,UA: NEGATIVE
RBC, UA: NEGATIVE
Specific Gravity, UA: 1.02 (ref 1.005–1.030)
Urobilinogen, Ur: 0.2 mg/dL (ref 0.2–1.0)
pH, UA: 7 (ref 5.0–7.5)

## 2021-09-12 MED ORDER — CYCLOBENZAPRINE HCL 5 MG PO TABS: 5.0000 mg | ORAL_TABLET | Freq: Two times a day (BID) | ORAL | 1 refills | Status: DC

## 2021-09-12 NOTE — Progress Notes (Signed)
09/12/2021 11:26 AM   [name] 15-May-1976 [id number]  Referring provider: Tylene Fantasia., PA-C [address] [place],  [place] [address]  nephrolithiasis   HPI: [name] is a 45yo here for followup for nephrolithiasis, No stone events since last visit. No flank pain. Renal US shows mild left hydronephrosis and a 35mm right renal calculus. She has intermittent right back pain which is worse when standing from a sitting position. No other complaints today. UA normal   PMH: Past Medical History:  Diagnosis Date   Asthma    COPD (chronic obstructive pulmonary disease) (HCC)    sees PCP   Depression    GERD (gastroesophageal reflux disease)    History of kidney stones    Hypertension    pt. denies  never taken meds   Pneumonia    PONV (postoperative nausea and vomiting)     Surgical History: Past Surgical History:  Procedure Laterality Date   ABDOMINAL HYSTERECTOMY     CHOLECYSTECTOMY     CYSTOSCOPY W/ RETROGRADES  08/04/2011   Procedure: CYSTOSCOPY WITH RETROGRADE PYELOGRAM;  Surgeon: Ky Barban;  Location: [facility];  Service: Urology;  Laterality: Right;  Right Retrograde   CYSTOSCOPY WITH RETROGRADE PYELOGRAM, URETEROSCOPY AND STENT PLACEMENT Left 07/24/2021   Procedure: CYSTOSCOPY WITH RETROGRADE PYELOGRAM, URETEROSCOPY AND STENT PLACEMENT;  Surgeon: Malen Gauze, MD;  Location: [facility];  Service: Urology;  Laterality: Left;   HOLMIUM LASER APPLICATION N/A 04/15/2018   Procedure: HOLMIUM LASER APPLICATION;  Surgeon: Crist Fat, MD;  Location: [facility];  Service: Urology;  Laterality: N/A;   HOLMIUM LASER APPLICATION Left 07/24/2021   Procedure: HOLMIUM LASER APPLICATION;  Surgeon: Malen Gauze, MD;  Location: [facility];  Service: Urology;  Laterality: Left;   LITHOTRIPSY     NEPHROLITHOTOMY Right 04/15/2018   Procedure: RIGHT NEPHROLITHOTOMY PERCUTANEOUS WITH ACCESS;  Surgeon: Crist Fat, MD;  Location: [facility];  Service:  Urology;  Laterality: Right;   NEPHROLITHOTOMY Right 12/29/2018   Procedure: NEPHROLITHOTOMY PERCUTANEOUS;  Surgeon: Crist Fat, MD;  Location: [facility];  Service: Urology;  Laterality: Right;   PERCUTANEOUS NEPHROLITHOTRIPSY  2012   both sides kidney    TUBAL LIGATION      Home Medications:  Allergies as of 09/12/2021       Reactions   Hydrocodone Nausea And Vomiting   Morphine And Related Hives, Other (See Comments)   Welps   Vicodin [hydrocodone-acetaminophen] Nausea And Vomiting        Medication List        Accurate as of September 12, 2021 11:26 AM. If you have any questions, ask your nurse or doctor.          albuterol 108 (90 Base) MCG/ACT inhaler Commonly known as: VENTOLIN HFA Inhale 2 puffs into the lungs every 4 (four) hours as needed for wheezing or shortness of breath.   B-12 PO Take 1 tablet by mouth daily.   cephALEXin 500 MG capsule Commonly known as: Keflex Take 1 capsule (500 mg total) by mouth in the morning and at bedtime. Please start medication 3 days prior to your surgery date.   cholecalciferol 25 MCG (1000 UNIT) tablet Commonly known as: VITAMIN D3 Take 1,000 Units by mouth daily.   Fish Oil 1000 MG Caps Take 1,000 mg by mouth in the morning and at bedtime.   ibuprofen 800 MG tablet Commonly known as: ADVIL Take 800 mg by mouth every 8 (eight) hours as needed  for moderate pain.   multivitamin with minerals Tabs tablet Take 1 tablet by mouth daily.   ondansetron 4 MG disintegrating tablet Commonly known as: Zofran ODT Take 1 tablet (4 mg total) by mouth every 8 (eight) hours as needed.   oxyCODONE-acetaminophen 5-325 MG tablet Commonly known as: PERCOCET/ROXICET Take 1 tablet by mouth every 4 (four) hours as needed for severe pain.   Potassium Citrate 15 MEQ (1620 MG) Tbcr Take 1 tablet by mouth in the morning and at bedtime.        Allergies:  Allergies  Allergen Reactions   Hydrocodone Nausea And Vomiting    Morphine And Related Hives and Other (See Comments)    Welps   Vicodin [Hydrocodone-Acetaminophen] Nausea And Vomiting    Family History: Family History  Problem Relation Age of Onset   COPD Mother    COPD Father    Heart attack Father     Social History:  reports that she quit smoking about 7 months ago. Her smoking use included cigarettes. She has a 9.00 pack-year smoking history. She has never used smokeless tobacco. She reports that she does not drink alcohol and does not use drugs.  ROS: All other review of systems were reviewed and are negative except what is noted above in HPI  Physical Exam: BP (!) 149/85   Pulse 91   Temp 98.1 F (36.7 C)   LMP 11/05/2015   Constitutional:  Alert and oriented, No acute distress. HEENT: [place] AT, moist mucus membranes.  Trachea midline, no masses. Cardiovascular: No clubbing, cyanosis, or edema. Respiratory: Normal respiratory effort, no increased work of breathing. GI: Abdomen is soft, nontender, nondistended, no abdominal masses GU: No CVA tenderness.  Lymph: No cervical or inguinal lymphadenopathy. Skin: No rashes, bruises or suspicious lesions. Neurologic: Grossly intact, no focal deficits, moving all 4 extremities. Psychiatric: Normal mood and affect.  Laboratory Data: Lab Results  Component Value Date   WBC 7.7 10/12/2019   HGB 13.1 10/12/2019   HCT 41.2 10/12/2019   MCV 96.3 10/12/2019   PLT 239 10/12/2019    Lab Results  Component Value Date   CREATININE 0.76 08/22/2021    No results found for: PSA  No results found for: TESTOSTERONE  Lab Results  Component Value Date   HGBA1C  01/20/2008    5.6 (NOTE)   The ADA recommends the following therapeutic goals for glycemic   control related to Hgb A1C measurement:   Goal of Therapy:   < 7.0% Hgb A1C   Action Suggested:  > 8.0% Hgb A1C   Ref:  Diabetes Care, 22, Suppl. 1, 1999    Urinalysis    Component Value Date/Time   COLORURINE YELLOW 10/12/2019 0727    APPEARANCEUR Clear 08/06/2021 1318   LABSPEC 1.012 10/12/2019 0727   PHURINE 6.0 10/12/2019 0727   GLUCOSEU Negative 08/06/2021 1318   HGBUR MODERATE (A) 10/12/2019 0727   BILIRUBINUR Negative 08/06/2021 1318   KETONESUR NEGATIVE 10/12/2019 0727   PROTEINUR 2+ (A) 08/06/2021 1318   PROTEINUR NEGATIVE 10/12/2019 0727   UROBILINOGEN 0.2 02/12/2015 1932   NITRITE Negative 08/06/2021 1318   NITRITE NEGATIVE 10/12/2019 0727   LEUKOCYTESUR 2+ (A) 08/06/2021 1318   LEUKOCYTESUR TRACE (A) 10/12/2019 0727    Lab Results  Component Value Date   LABMICR See below: 08/06/2021   WBCUA >30 (A) 08/06/2021   LABEPIT >10 (A) 08/06/2021   BACTERIA Few 08/06/2021    Pertinent Imaging: Renal US 08/28/2021: Images reviewed and discussed with the patient  Results for orders placed during the hospital encounter of 07/02/21  DG Abd 1 View  Narrative CLINICAL DATA:  Left-sided flank pain.  EXAM: ABDOMEN - 1 VIEW  COMPARISON:  Ultrasound renal 07/02/2021, CT renal 10/10/2019  FINDINGS: Right upper quadrant surgical clips. The bowel gas pattern is normal. No radio-opaque calculi or other significant radiographic abnormality are seen.  IMPRESSION: Negative radiograph.  These results and ultrasound renal results from same day will be called to the ordering clinician or representative by the Radiologist Assistant, and communication documented in the PACS or [employer].   Electronically Signed By: Tish Frederickson M.D. On: 07/03/2021 21:41  No results found for this or any previous visit.  No results found for this or any previous visit.  No results found for this or any previous visit.  Results for orders placed during the hospital encounter of 08/28/21  Ultrasound renal complete  Narrative CLINICAL DATA:  Nephrolithiasis  EXAM: RENAL / URINARY TRACT ULTRASOUND COMPLETE  COMPARISON:  07/02/2021  FINDINGS: Right Kidney:  Renal measurements: 13.0 x 5.8 by 6.4 cm =  volume: 255.4 mL. Echogenicity within normal limits. 9 mm nonobstructing calculus lower pole. No hydronephrosis.  Left Kidney:  Renal measurements: 12.0 x 5.5 by 5.8 cm = volume: 200.8 mL. Echogenicity within normal limits. There is mild residual hydronephrosis though marked improvement since prior study. No left-sided calculi.  Bladder:  Appears normal for degree of bladder distention.  Other:  None.  IMPRESSION: 1. Mild residual left-sided hydronephrosis, with marked improvement since prior study. 2. 9 mm nonobstructing right renal calculus.   Electronically Signed By: Sharlet Salina M.D. On: 08/29/2021 21:53  No results found for this or any previous visit.  No results found for this or any previous visit.  Results for orders placed during the hospital encounter of 10/09/19  CT Renal Stone Study  Narrative CLINICAL DATA:  Right flank pain  EXAM: CT ABDOMEN AND PELVIS WITHOUT CONTRAST  TECHNIQUE: Multidetector CT imaging of the abdomen and pelvis was performed following the standard protocol without IV contrast.  COMPARISON:  Most recent prior Apr 16, 2018  FINDINGS: Lower chest: The visualized heart size within normal limits. No pericardial fluid/thickening.  There is a moderate paraesophageal hernia.  The visualized portions of the lungs are clear.  Hepatobiliary: Although limited due to the lack of intravenous contrast, normal in appearance without gross focal abnormality. The patient is status post cholecystectomy. No biliary ductal dilation.  Pancreas:  Unremarkable.  No surrounding inflammatory changes.  Spleen: Normal in size. Although limited due to the lack of intravenous contrast, normal in appearance.  Adrenals/Urinary Tract: Both adrenal glands appear normal. Again noted is bilateral nephrocalcinosis. There is new severe right pelvicaliectasis and ureterectasis down to the level of the distal ureter where there is a 7 mm calculus.  There is significant right perinephric and periureteral stranding. Within the left kidney there several renal calculi, the largest within the lower pole measuring 8 mm. There is mild proximal periureteral stranding. No distal left ureteral calculi are seen. No bladder calculi are noted.  Stomach/Bowel: The stomach, small bowel, and colon are normal in appearance. No inflammatory changes or obstructive findings. appendix is normal.  Vascular/Lymphatic: There are no enlarged abdominal or pelvic lymph nodes. No significant gross vascular findings are present.  Reproductive:  Other: No evidence of abdominal wall mass or hernia.  Musculoskeletal: No acute or significant osseous findings.  IMPRESSION: 1. Obstructing 7 mm right distal ureteral calculus causing severe right hydronephrosis with  perinephric/ureteral inflammatory changes. 2. Nonobstructing left renal calculi the largest measuring 8 mm in the lower pole with mild proximal inflammatory changes which could be due to ureteritis. 3. Findings suggestive of bilateral nephrocalcinosis.   Electronically Signed By: Jonna Clark M.D. On: 10/10/2019 01:35   Assessment & Plan:    1. Kidney stones -RTC 3 months with renal US - Urinalysis, Routine w reflex microscopic - Ultrasound renal complete; Future     No follow-ups on file.  [name], MD  [facility] [place]

## 2021-09-12 NOTE — Progress Notes (Signed)

## 2021-09-12 NOTE — Patient Instructions (Signed)
Dietary Guidelines to Help Prevent Kidney Stones Kidney stones are deposits of minerals and salts that form inside your kidneys. Your risk of developing kidney stones may be greater depending on your diet, your lifestyle, the medicines you take, and whether you have certain medical conditions. Most people can lower their chances of developing kidney stones by following the instructions below. Your dietitian may give you more specific instructions depending on your overall health and the type of kidney stones you tend to develop. What are tips for following this plan? Reading food labels  Choose foods with "no salt added" or "low-salt" labels. Limit your salt (sodium) intake to less than 1,500 mg a day. Choose foods with calcium for each meal and snack. Try to eat about 300 mg of calcium at each meal. Foods that contain 200-500 mg of calcium a serving include: 8 oz (237 mL) of milk, calcium-fortifiednon-dairy milk, and calcium-fortifiedfruit juice. Calcium-fortified means that calcium has been added to these drinks. 8 oz (237 mL) of kefir, yogurt, and soy yogurt. 4 oz (114 g) of tofu. 1 oz (28 g) of cheese. 1 cup (150 g) of dried figs. 1 cup (91 g) of cooked broccoli. One 3 oz (85 g) can of sardines or mackerel. Most people need 1,000-1,500 mg of calcium a day. Talk to your dietitian about how much calcium is recommended for you. Shopping Buy plenty of fresh fruits and vegetables. Most people do not need to avoid fruits and vegetables, even if these foods contain nutrients that may contribute to kidney stones. When shopping for convenience foods, choose: Whole pieces of fruit. Pre-made salads with dressing on the side. Low-fat fruit and yogurt smoothies. Avoid buying frozen meals or prepared deli foods. These can be high in sodium. Look for foods with live cultures, such as yogurt and kefir. Choose high-fiber grains, such as whole-wheat breads, oat bran, and wheat cereals. Cooking Do not add  salt to food when cooking. Place a salt shaker on the table and allow each person to add his or her own salt to taste. Use vegetable protein, such as beans, textured vegetable protein (TVP), or tofu, instead of meat in pasta, casseroles, and soups. Meal planning Eat less salt, if told by your dietitian. To do this: Avoid eating processed or pre-made food. Avoid eating fast food. Eat less animal protein, including cheese, meat, poultry, or fish, if told by your dietitian. To do this: Limit the number of times you have meat, poultry, fish, or cheese each week. Eat a diet free of meat at least 2 days a week. Eat only one serving each day of meat, poultry, fish, or seafood. When you prepare animal protein, cut pieces into small portion sizes. For most meat and fish, one serving is about the size of the palm of your hand. Eat at least five servings of fresh fruits and vegetables each day. To do this: Keep fruits and vegetables on hand for snacks. Eat one piece of fruit or a handful of berries with breakfast. Have a salad and fruit at lunch. Have two kinds of vegetables at dinner. Limit foods that are high in a substance called oxalate. These include: Spinach (cooked), rhubarb, beets, sweet potatoes, and Swiss chard. Peanuts. Potato chips, french fries, and baked potatoes with skin on. Nuts and nut products. Chocolate. If you regularly take a diuretic medicine, make sure to eat at least 1 or 2 servings of fruits or vegetables that are high in potassium each day. These include: Avocado. Banana. Orange, prune,   carrot, or tomato juice. Baked potato. Cabbage. Beans and split peas. Lifestyle  Drink enough fluid to keep your urine pale yellow. This is the most important thing you can do. Spread your fluid intake throughout the day. If you drink alcohol: Limit how much you use to: 0-1 drink a day for women who are not pregnant. 0-2 drinks a day for men. Be aware of how much alcohol is in your  drink. In the [place] one drink equals one 12 oz bottle of beer (355 mL), one 5 oz glass of wine (148 mL), or one 1 oz glass of hard liquor (44 mL). Lose weight if told by your health care provider. Work with your dietitian to find an eating plan and weight loss strategies that work best for you. General information Talk to your health care provider and dietitian about taking daily supplements. You may be told the following depending on your health and the cause of your kidney stones: Not to take supplements with vitamin C. To take a calcium supplement. To take a daily probiotic supplement. To take other supplements such as magnesium, fish oil, or vitamin B6. Take over-the-counter and prescription medicines only as told by your health care provider. These include supplements. What foods should I limit? Limit your intake of the following foods, or eat them as told by your dietitian. Vegetables Spinach. Rhubarb. Beets. Canned vegetables. Pickles. Olives. Baked potatoes with skin. Grains Wheat bran. Baked goods. Salted crackers. Cereals high in sugar. Meats and other proteins Nuts. Nut butters. Large portions of meat, poultry, or fish. Salted, precooked, or cured meats, such as sausages, meat loaves, and hot dogs. Dairy Cheese. Beverages Regular soft drinks. Regular vegetable juice. Seasonings and condiments Seasoning blends with salt. Salad dressings. Soy sauce. Ketchup. Barbecue sauce. Other foods Canned soups. Canned pasta sauce. Casseroles. Pizza. Lasagna. Frozen meals. Potato chips. French fries. The items listed above may not be a complete list of foods and beverages you should limit. Contact a dietitian for more information. What foods should I avoid? Talk to your dietitian about specific foods you should avoid based on the type of kidney stones you have and your overall health. Fruits Grapefruit. The item listed above may not be a complete list of foods and beverages you should  avoid. Contact a dietitian for more information. Summary Kidney stones are deposits of minerals and salts that form inside your kidneys. You can lower your risk of kidney stones by making changes to your diet. The most important thing you can do is drink enough fluid. Drink enough fluid to keep your urine pale yellow. Talk to your dietitian about how much calcium you should have each day, and eat less salt and animal protein as told by your dietitian. This information is not intended to replace advice given to you by your health care provider. Make sure you discuss any questions you have with your health care provider. Document Revised: 11/09/2019 Document Reviewed: 11/09/2019 Elsevier Patient Education  2022 Elsevier Inc.  

## 2021-10-16 ENCOUNTER — Other Ambulatory Visit: Payer: Self-pay | Admitting: Urology

## 2021-11-07 ENCOUNTER — Ambulatory Visit (HOSPITAL_COMMUNITY): Payer: 59

## 2021-11-11 ENCOUNTER — Ambulatory Visit (HOSPITAL_COMMUNITY)
Admission: RE | Admit: 2021-11-11 | Discharge: 2021-11-11 | Disposition: A | Discharge status: Home / Self Care | Payer: 59 | Source: Ambulatory Visit | Attending: Urology | Admitting: Urology

## 2021-11-11 ENCOUNTER — Other Ambulatory Visit: Payer: Self-pay

## 2021-11-11 DIAGNOSIS — N200 Calculus of kidney: Secondary | ICD-10-CM | POA: Diagnosis not present

## 2021-12-12 ENCOUNTER — Ambulatory Visit: Payer: 59 | Admitting: Urology

## 2021-12-19 ENCOUNTER — Ambulatory Visit: Payer: 59 | Admitting: Urology

## 2021-12-19 DIAGNOSIS — N201 Calculus of ureter: Secondary | ICD-10-CM

## 2021-12-19 DIAGNOSIS — N200 Calculus of kidney: Secondary | ICD-10-CM

## 2022-01-14 ENCOUNTER — Ambulatory Visit: Payer: 59 | Admitting: Urology

## 2022-01-23 ENCOUNTER — Ambulatory Visit: Payer: Self-pay | Admitting: Nurse Practitioner

## 2022-02-06 ENCOUNTER — Ambulatory Visit (INDEPENDENT_AMBULATORY_CARE_PROVIDER_SITE_OTHER): Payer: 59 | Admitting: Nurse Practitioner

## 2022-02-06 ENCOUNTER — Encounter: Payer: Self-pay | Admitting: Nurse Practitioner

## 2022-02-06 VITALS — BP 124/80 | HR 91 | Temp 98.8°F | Ht 66.0 in | Wt 311.0 lb

## 2022-02-06 DIAGNOSIS — F419 Anxiety disorder, unspecified: Secondary | ICD-10-CM

## 2022-02-06 DIAGNOSIS — Z0000 Encounter for general adult medical examination without abnormal findings: Secondary | ICD-10-CM | POA: Insufficient documentation

## 2022-02-06 DIAGNOSIS — F339 Major depressive disorder, recurrent, unspecified: Secondary | ICD-10-CM

## 2022-02-06 DIAGNOSIS — R5383 Other fatigue: Secondary | ICD-10-CM | POA: Diagnosis not present

## 2022-02-06 DIAGNOSIS — F321 Major depressive disorder, single episode, moderate: Secondary | ICD-10-CM

## 2022-02-06 LAB — LIPID PANEL

## 2022-02-06 NOTE — Assessment & Plan Note (Signed)
Patient is new to practice establishing care.  Completed education on disease management and health maintenance.  Patient will schedule a physical and Pap in the future. ?

## 2022-02-06 NOTE — Assessment & Plan Note (Signed)
Depression not well controlled.  Patient attributes worsening symptoms to weight gain, history of bipolar.  Was taking Zyprexa but discontinued due to cost of medication.  Completed PHQ-9.  Education provided to patient and we discussed different options of medication and possible psychiatric referral for evaluation. ?

## 2022-02-06 NOTE — Assessment & Plan Note (Signed)
Completed CBC CMP to reassess because of fatigue.  Results pending ?

## 2022-02-06 NOTE — Assessment & Plan Note (Signed)
Low calorie diet recommendation, weight loss, increase hydration, labs completed CBC CMP lipid panel TSH. ?

## 2022-02-06 NOTE — Progress Notes (Signed)
New Patient Note  RE: Derenda MisBrandy L Majkowski MRN: [id number] DOB: 01-05-1976 Date of Office Visit: 02/06/2022  Chief Complaint: New Patient (Initial Visit)  History of Present Illness: Patient is a 46 year old female who presents to clinic to establish care with complaints of fatigue, obesity and prediabetes. Labs completed to rule out diabetes, cause of fatigue, and weight gain.  No fever, nausea, headache or other symptoms associated with current complaint.  Fatigue: Patient complains of fatigue. Symptoms began several weeks ago. Sentinal symptom the patient feels fatigue began with: significant change in weight. Symptoms of her fatigue have been feelings of depression and general malaise. Patient describes the following psychologic symptoms: depression.  Patient denies fever, symptoms of arthritis, and GI blood loss. Symptoms have gradually worsened. Severity has been been. Previous visits for this problem: none.    Ear Pain: Patient presents with right ear pain.  Symptoms include right ear pain. Symptoms began a few weeks ago and are unchanged since that time. Patient denies  none . Ear history:  no  previous ear infections.    Patient is reporting moderate obesity with weight changes and increasing in the last few weeks.  Patient reports eating right and drinking water but cannot explain why she continues to put on weight.   Depression: Patient complains of depression. She complains of depressed mood, difficulty concentrating, and hopelessness. Onset was approximately a few years ago, unchanged since that time.  She denies current suicidal and homicidal plan or intent.   Family history significant for no psychiatric illness.Possible organic causes contributing are: none.  Risk factors: previous episode of depression Previous treatment includes  Zyprexa  and  Zyprexa . She complains of the following side effects from the treatment: none Patient not currently taking medication due to cost.    Flowsheet Row [facility]  PHQ-9 Total Score 12       GAD 7 : Generalized Anxiety Score 02/06/2022 02/06/2022  Nervous, Anxious, on Edge - 1  Control/stop worrying - 2  Worry too much - different things - 2  Trouble relaxing - 1  Restless - 0  Easily annoyed or irritable - 3  Afraid - awful might happen - 0  Total GAD 7 Score - 9  Anxiety Difficulty Not difficult at all Not difficult at all       Assessment and Plan: Gearldine BienenstockBrandy is a 46 y.o. female with: Depression, major, single episode, moderate (HCC) Depression not well controlled.  Patient attributes worsening symptoms to weight gain, history of bipolar.  Was taking Zyprexa but discontinued due to cost of medication.  Completed PHQ-9.  Education provided to patient and we discussed different options of medication and possible psychiatric referral for evaluation.  Morbid obesity (HCC) Low calorie diet recommendation, weight loss, increase hydration, labs completed CBC CMP lipid panel TSH.  Other fatigue Completed CBC CMP to reassess because of fatigue.  Results pending  Encounter for medical examination to establish care Patient is new to practice establishing care.  Completed education on disease management and health maintenance.  Patient will schedule a physical and Pap in the future.  Return in 3 months (on 05/09/2022).   Diagnostics:   Past Medical History: Patient Active Problem List   Diagnosis Date Noted   Encounter for medical examination to establish care 02/06/2022   Other fatigue 02/06/2022   Morbid obesity (HCC) 02/06/2022   Depression, major, single episode, moderate (HCC) 02/06/2022   Nephrolithiasis 12/29/2018   Stone, kidney  04/15/2018   Asthma, moderate persistent 11/12/2015   Leukocytosis 09/25/2011   Anxiety 09/25/2011   Acute exacerbation of COPD with asthma (HCC) 09/24/2011   Acute bronchitis 09/23/2011   Aspiration of gastric contents  08/04/2011   COPD (chronic obstructive pulmonary disease) (HCC) 08/04/2011   Kidney stones 08/04/2011   Past Medical History:  Diagnosis Date   Asthma    COPD (chronic obstructive pulmonary disease) (HCC)    sees PCP   Depression    GERD (gastroesophageal reflux disease)    History of kidney stones    Hypertension    pt. denies  never taken meds   Pneumonia    PONV (postoperative nausea and vomiting)    Past Surgical History: Past Surgical History:  Procedure Laterality Date   ABDOMINAL HYSTERECTOMY     CHOLECYSTECTOMY     CYSTOSCOPY W/ RETROGRADES  08/04/2011   Procedure: CYSTOSCOPY WITH RETROGRADE PYELOGRAM;  Surgeon: Ky Barban;  Location: [facility];  Service: Urology;  Laterality: Right;  Right Retrograde   CYSTOSCOPY WITH RETROGRADE PYELOGRAM, URETEROSCOPY AND STENT PLACEMENT Left 07/24/2021   Procedure: CYSTOSCOPY WITH RETROGRADE PYELOGRAM, URETEROSCOPY AND STENT PLACEMENT;  Surgeon: Malen Gauze, MD;  Location: [facility];  Service: Urology;  Laterality: Left;   HOLMIUM LASER APPLICATION N/A 04/15/2018   Procedure: HOLMIUM LASER APPLICATION;  Surgeon: Crist Fat, MD;  Location: [facility];  Service: Urology;  Laterality: N/A;   HOLMIUM LASER APPLICATION Left 07/24/2021   Procedure: HOLMIUM LASER APPLICATION;  Surgeon: Malen Gauze, MD;  Location: [facility];  Service: Urology;  Laterality: Left;   LITHOTRIPSY     NEPHROLITHOTOMY Right 04/15/2018   Procedure: RIGHT NEPHROLITHOTOMY PERCUTANEOUS WITH ACCESS;  Surgeon: Crist Fat, MD;  Location: [facility];  Service: Urology;  Laterality: Right;   NEPHROLITHOTOMY Right 12/29/2018   Procedure: NEPHROLITHOTOMY PERCUTANEOUS;  Surgeon: Crist Fat, MD;  Location: [facility];  Service: Urology;  Laterality: Right;   PERCUTANEOUS NEPHROLITHOTRIPSY  2012   both sides kidney    TUBAL LIGATION     Medication List:  Current Outpatient Medications  Medication Sig Dispense Refill   albuterol (VENTOLIN HFA) 108 (90  Base) MCG/ACT inhaler Inhale 2 puffs into the lungs every 4 (four) hours as needed for wheezing or shortness of breath. 8 g 0   cholecalciferol (VITAMIN D3) 25 MCG (1000 UNIT) tablet Take 1,000 Units by mouth daily.     Cyanocobalamin (B-12 PO) Take 1 tablet by mouth daily.     cyclobenzaprine (FLEXERIL) 5 MG tablet TAKE ONE TABLET BY MOUTH IN THE MORNING AND AT BEDTIME 30 tablet 1   ibuprofen (ADVIL) 800 MG tablet Take 800 mg by mouth every 8 (eight) hours as needed for moderate pain.     Multiple Vitamin (MULTIVITAMIN WITH MINERALS) TABS tablet Take 1 tablet by mouth daily.     Omega-3 Fatty Acids (FISH OIL) 1000 MG CAPS Take 1,000 mg by mouth in the morning and at bedtime.     Potassium Citrate 15 MEQ (1620 MG) TBCR Take 1 tablet by mouth in the morning and at bedtime. 60 tablet 11   cephALEXin (KEFLEX) 500 MG capsule Take 1 capsule (500 mg total) by mouth in the morning and at bedtime. Please start medication 3 days prior to your surgery date. (Patient not taking: Reported on 02/06/2022) 20 capsule 0   cetirizine (ZYRTEC) 10 MG tablet Take 10 mg by mouth daily as needed.     ondansetron (ZOFRAN ODT) 4 MG disintegrating tablet Take 1  tablet (4 mg total) by mouth every 8 (eight) hours as needed. (Patient not taking: Reported on 02/06/2022) 30 tablet 0   oxyCODONE-acetaminophen (PERCOCET/ROXICET) 5-325 MG tablet Take 1 tablet by mouth every 4 (four) hours as needed for severe pain. (Patient not taking: Reported on 02/06/2022) 30 tablet 0   No current facility-administered medications for this visit.   Allergies: Allergies  Allergen Reactions   Hydrocodone Nausea And Vomiting   Morphine And Related Hives and Other (See Comments)    Welps   Vicodin [Hydrocodone-Acetaminophen] Nausea And Vomiting   Social History: Social History   Socioeconomic History   Marital status: Single    Spouse name: Not on file   Number of children: Not on file   Years of education: Not on file   Highest education  level: Not on file  Occupational History   Not on file  Tobacco Use   Smoking status: Former    Packs/day: 0.50    Years: 18.00    Pack years: 9.00    Types: Cigarettes    Quit date: 01/21/2021    Years since quitting: 1.0   Smokeless tobacco: Never  Vaping Use   Vaping Use: Never used  Substance and Sexual Activity   Alcohol use: No   Drug use: No   Sexual activity: Yes    Birth control/protection: Surgical  Other Topics Concern   Not on file  Social History Narrative   Not on file   Social Determinants of Health   Financial Resource Strain: Not on file  Food Insecurity: Not on file  Transportation Needs: Not on file  Physical Activity: Not on file  Stress: Not on file  Social Connections: Not on file       Family History: Family History  Problem Relation Age of Onset   COPD Mother    COPD Father    Heart attack Father          Review of Systems  Constitutional: Negative.   HENT:  Positive for ear pain. Negative for ear discharge.   Eyes: Negative.   Respiratory: Negative.    Cardiovascular: Negative.   Gastrointestinal: Negative.   Musculoskeletal: Negative.   Skin: Negative.  Negative for rash.  Psychiatric/Behavioral:  The patient is nervous/anxious.   All other systems reviewed and are negative. Objective: BP 124/80    Pulse 91    Temp 98.8 F (37.1 C)    Ht  (1.676 m)    Wt (!) 311 lb (141.1 kg)    LMP 11/05/2015    SpO2 97%    BMI 50.20 kg/m  Body mass index is 50.2 kg/m. Physical Exam Vitals and nursing note reviewed.  Constitutional:      Appearance: She is obese.  HENT:     Right Ear: External ear normal. There is impacted cerumen.     Left Ear: External ear normal.     Nose: Nose normal. No congestion.     Mouth/Throat:     Mouth: Mucous membranes are moist.     Pharynx: Oropharynx is clear.  Eyes:     Conjunctiva/sclera: Conjunctivae normal.  Cardiovascular:     Rate and Rhythm: Normal rate.  Pulmonary:     Breath sounds:  Normal breath sounds.  Skin:    General: Skin is warm.     Findings: No rash.  Neurological:     General: No focal deficit present.     Mental Status: She is alert and oriented to person, place, and time.  Psychiatric:        Attention and Perception: Attention and perception normal.        Mood and Affect: Mood is anxious and depressed.        Speech: Speech normal.        Behavior: Behavior normal. Behavior is cooperative.        Thought Content: Thought content does not include suicidal ideation. Thought content does not include suicidal plan.   The plan was reviewed with the patient/family, and all questions/concerned were addressed.  It was my pleasure to see Rudie today and participate in her care. Please feel free to contact me with any questions or concerns.  Sincerely,   Lynnell Chad NP [facility]

## 2022-02-06 NOTE — Patient Instructions (Signed)
Calorie Counting for Weight Loss °Calories are units of energy. Your body needs a certain number of calories from food to keep going throughout the day. When you eat or drink more calories than your body needs, your body stores the extra calories mostly as fat. When you eat or drink fewer calories than your body needs, your body burns fat to get the energy it needs. °Calorie counting means keeping track of how many calories you eat and drink each day. Calorie counting can be helpful if you need to lose weight. If you eat fewer calories than your body needs, you should lose weight. Ask your health care provider what a healthy weight is for you. °For calorie counting to work, you will need to eat the right number of calories each day to lose a healthy amount of weight per week. A dietitian can help you figure out how many calories you need in a day and will suggest ways to reach your calorie goal. °A healthy amount of weight to lose each week is usually 1-2 lb (0.5-0.9 kg). This usually means that your daily calorie intake should be reduced by 500-750 calories. °Eating 1,200-1,500 calories a day can help most women lose weight. °Eating 1,500-1,800 calories a day can help most men lose weight. °What do I need to know about calorie counting? °Work with your health care provider or dietitian to determine how many calories you should get each day. To meet your daily calorie goal, you will need to: °Find out how many calories are in each food that you would like to eat. Try to do this before you eat. °Decide how much of the food you plan to eat. °Keep a food log. Do this by writing down what you ate and how many calories it had. °To successfully lose weight, it is important to balance calorie counting with a healthy lifestyle that includes regular activity. °Where do I find calorie information? °The number of calories in a food can be found on a Nutrition Facts label. If a food does not have a Nutrition Facts label, try to  look up the calories online or ask your dietitian for help. °Remember that calories are listed per serving. If you choose to have more than one serving of a food, you will have to multiply the calories per serving by the number of servings you plan to eat. For example, the label on a package of bread might say that a serving size is 1 slice and that there are 90 calories in a serving. If you eat 1 slice, you will have eaten 90 calories. If you eat 2 slices, you will have eaten 180 calories. °How do I keep a food log? °After each time that you eat, record the following in your food log as soon as possible: °What you ate. Be sure to include toppings, sauces, and other extras on the food. °How much you ate. This can be measured in cups, ounces, or number of items. °How many calories were in each food and drink. °The total number of calories in the food you ate. °Keep your food log near you, such as in a pocket-sized notebook or on an app or website on your mobile phone. Some programs will calculate calories for you and show you how many calories you have left to meet your daily goal. °What are some portion-control tips? °Know how many calories are in a serving. This will help you know how many servings you can have of a certain food. °  Use a measuring cup to measure serving sizes. You could also try weighing out portions on a kitchen scale. With time, you will be able to estimate serving sizes for some foods. °Take time to put servings of different foods on your favorite plates or in your favorite bowls and cups so you know what a serving looks like. °Try not to eat straight from a food's packaging, such as from a bag or box. Eating straight from the package makes it hard to see how much you are eating and can lead to overeating. Put the amount you would like to eat in a cup or on a plate to make sure you are eating the right portion. °Use smaller plates, glasses, and bowls for smaller portions and to prevent  overeating. °Try not to multitask. For example, avoid watching TV or using your computer while eating. If it is time to eat, sit down at a table and enjoy your food. This will help you recognize when you are full. It will also help you be more mindful of what and how much you are eating. °What are tips for following this plan? °Reading food labels °Check the calorie count compared with the serving size. The serving size may be smaller than what you are used to eating. °Check the source of the calories. Try to choose foods that are high in protein, fiber, and vitamins, and low in saturated fat, trans fat, and sodium. °Shopping °Read nutrition labels while you shop. This will help you make healthy decisions about which foods to buy. °Pay attention to nutrition labels for low-fat or fat-free foods. These foods sometimes have the same number of calories or more calories than the full-fat versions. They also often have added sugar, starch, or salt to make up for flavor that was removed with the fat. °Make a grocery list of lower-calorie foods and stick to it. °Cooking °Try to cook your favorite foods in a healthier way. For example, try baking instead of frying. °Use low-fat dairy products. °Meal planning °Use more fruits and vegetables. One-half of your plate should be fruits and vegetables. °Include lean proteins, such as chicken, turkey, and fish. °Lifestyle °Each week, aim to do one of the following: °150 minutes of moderate exercise, such as walking. °75 minutes of vigorous exercise, such as running. °General information °Know how many calories are in the foods you eat most often. This will help you calculate calorie counts faster. °Find a way of tracking calories that works for you. Get creative. Try different apps or programs if writing down calories does not work for you. °What foods should I eat? ° °Eat nutritious foods. It is better to have a nutritious, high-calorie food, such as an avocado, than a food with  few nutrients, such as a bag of potato chips. °Use your calories on foods and drinks that will fill you up and will not leave you hungry soon after eating. °Examples of foods that fill you up are nuts and nut butters, vegetables, lean proteins, and high-fiber foods such as whole grains. High-fiber foods are foods with more than 5 g of fiber per serving. °Pay attention to calories in drinks. Low-calorie drinks include water and unsweetened drinks. °The items listed above may not be a complete list of foods and beverages you can eat. Contact a dietitian for more information. °What foods should I limit? °Limit foods or drinks that are not good sources of vitamins, minerals, or protein or that are high in unhealthy fats. These include: °  Candy. Other sweets. Sodas, specialty coffee drinks, alcohol, and juice. The items listed above may not be a complete list of foods and beverages you should avoid. Contact a dietitian for more information. How do I count calories when eating out? Pay attention to portions. Often, portions are much larger when eating out. Try these tips to keep portions smaller: Consider sharing a meal instead of getting your own. If you get your own meal, eat only half of it. Before you start eating, ask for a container and put half of your meal into it. When available, consider ordering smaller portions from the menu instead of full portions. Pay attention to your food and drink choices. Knowing the way food is cooked and what is included with the meal can help you eat fewer calories. If calories are listed on the menu, choose the lower-calorie options. Choose dishes that include vegetables, fruits, whole grains, low-fat dairy products, and lean proteins. Choose items that are boiled, broiled, grilled, or steamed. Avoid items that are buttered, battered, fried, or served with cream sauce. Items labeled as crispy are usually fried, unless stated otherwise. Choose water, low-fat milk,  unsweetened iced tea, or other drinks without added sugar. If you want an alcoholic beverage, choose a lower-calorie option, such as a glass of wine or light beer. Ask for dressings, sauces, and syrups on the side. These are usually high in calories, so you should limit the amount you eat. If you want a salad, choose a garden salad and ask for grilled meats. Avoid extra toppings such as bacon, cheese, or fried items. Ask for the dressing on the side, or ask for olive oil and vinegar or lemon to use as dressing. Estimate how many servings of a food you are given. Knowing serving sizes will help you be aware of how much food you are eating at restaurants. Where to find more information Centers for Disease Control and Prevention: http://www.wolf.info/ [place] Department of Agriculture: http://www.wilson-mendoza.org/ Summary Calorie counting means keeping track of how many calories you eat and drink each day. If you eat fewer calories than your body needs, you should lose weight. A healthy amount of weight to lose per week is usually 1-2 lb (0.5-0.9 kg). This usually means reducing your daily calorie intake by 500-750 calories. The number of calories in a food can be found on a Nutrition Facts label. If a food does not have a Nutrition Facts label, try to look up the calories online or ask your dietitian for help. Use smaller plates, glasses, and bowls for smaller portions and to prevent overeating. Use your calories on foods and drinks that will fill you up and not leave you hungry shortly after a meal. This information is not intended to replace advice given to you by your health care provider. Make sure you discuss any questions you have with your health care provider. Document Revised: 12/28/2019 Document Reviewed: 12/28/2019 Elsevier Patient Education  2022 [place]. Fatigue If you have fatigue, you feel tired all the time and have a lack of energy or a lack of motivation. Fatigue may make it difficult to start or complete  tasks because of exhaustion. In general, occasional or mild fatigue is often a normal response to activity or life. However, long-lasting (chronic) or extreme fatigue may be a symptom of a medical condition. Follow these instructions at home: General instructions Watch your fatigue for any changes. Go to bed and get up at the same time every day. Avoid fatigue by pacing yourself  during the day and getting enough sleep at night. Maintain a healthy weight. Medicines Take over-the-counter and prescription medicines only as told by your health care provider. Take a multivitamin, if told by your health care provider.  Do not use herbal or dietary supplements unless they are approved by your health care provider. Activity  Exercise regularly, as told by your health care provider. Use or practice techniques to help you relax, such as yoga, tai chi, meditation, or massage therapy. Eating and drinking  Avoid heavy meals in the evening. Eat a well-balanced diet, which includes lean proteins, whole grains, plenty of fruits and vegetables, and low-fat dairy products. Avoid consuming too much caffeine. Avoid the use of alcohol. Drink enough fluid to keep your urine pale yellow. Lifestyle Change situations that cause you stress. Try to keep your work and personal schedule in balance. Do not use any products that contain nicotine or tobacco, such as cigarettes and e-cigarettes. If you need help quitting, ask your health care provider. Do not use drugs. Contact a health care provider if: Your fatigue does not get better. You have a fever. You suddenly lose or gain weight. You have headaches. You have trouble falling asleep or sleeping through the night. You feel angry, guilty, anxious, or sad. You are unable to have a bowel movement (constipation). Your skin is dry. You have swelling in your legs or another part of your body. Get help right away if: You feel confused. Your vision is  blurry. You feel faint or you pass out. You have a severe headache. You have severe pain in your abdomen, your back, or the area between your waist and hips (pelvis). You have chest pain, shortness of breath, or an irregular or fast heartbeat. You are unable to urinate, or you urinate less than normal. You have abnormal bleeding, such as bleeding from the rectum, vagina, nose, lungs, or nipples. You vomit blood. You have thoughts about hurting yourself or others. If you ever feel like you may hurt yourself or others, or have thoughts about taking your own life, get help right away. You can go to your nearest emergency department or call: Your local emergency services (911 in the [place] A suicide crisis helpline, such as the [facility] at (207)493-3875 or 988 in the [place]. This is open 24 hours a day. Summary If you have fatigue, you feel tired all the time and have a lack of energy or a lack of motivation. Fatigue may make it difficult to start or complete tasks because of exhaustion. Long-lasting (chronic) or extreme fatigue may be a symptom of a medical condition. Exercise regularly, as told by your health care provider. Change situations that cause you stress. Try to keep your work and personal schedule in balance. This information is not intended to replace advice given to you by your health care provider. Make sure you discuss any questions you have with your health care provider. Document Revised: 06/11/2021 Document Reviewed: 09/26/2020 Elsevier Patient Education  2022 [employer].

## 2022-02-07 LAB — CBC WITH DIFFERENTIAL/PLATELET
Basophils Absolute: 0.1 10*3/uL (ref 0.0–0.2)
Basos: 1 %
EOS (ABSOLUTE): 0.1 10*3/uL (ref 0.0–0.4)
Eos: 1 %
Hematocrit: 41.6 % (ref 34.0–46.6)
Hemoglobin: 13.9 g/dL (ref 11.1–15.9)
Immature Grans (Abs): 0 10*3/uL (ref 0.0–0.1)
Immature Granulocytes: 0 %
Lymphocytes Absolute: 2.6 10*3/uL (ref 0.7–3.1)
Lymphs: 28 %
MCH: 30.4 pg (ref 26.6–33.0)
MCHC: 33.4 g/dL (ref 31.5–35.7)
MCV: 91 fL (ref 79–97)
Monocytes Absolute: 0.6 10*3/uL (ref 0.1–0.9)
Monocytes: 6 %
Neutrophils Absolute: 5.9 10*3/uL (ref 1.4–7.0)
Neutrophils: 64 %
Platelets: 304 10*3/uL (ref 150–450)
RBC: 4.57 x10E6/uL (ref 3.77–5.28)
RDW: 12.6 % (ref 11.7–15.4)
WBC: 9.3 10*3/uL (ref 3.4–10.8)

## 2022-02-07 LAB — LIPID PANEL
Chol/HDL Ratio: 4 ratio (ref 0.0–4.4)
Cholesterol, Total: 160 mg/dL (ref 100–199)
HDL: 40 mg/dL (ref >39)
LDL Chol Calc (NIH): 91 mg/dL (ref 0–99)
Triglycerides: 169 mg/dL — ABNORMAL HIGH (ref 0–149)
VLDL Cholesterol Cal: 29 mg/dL (ref 5–40)

## 2022-02-07 LAB — COMPREHENSIVE METABOLIC PANEL
ALT: 37 IU/L — ABNORMAL HIGH (ref 0–32)
AST: 32 IU/L (ref 0–40)
Albumin/Globulin Ratio: 1.1 — ABNORMAL LOW (ref 1.2–2.2)
Albumin: 3.9 g/dL (ref 3.8–4.8)
Alkaline Phosphatase: 104 IU/L (ref 44–121)
BUN/Creatinine Ratio: 11 (ref 9–23)
BUN: 9 mg/dL (ref 6–24)
Bilirubin Total: 0.2 mg/dL (ref 0.0–1.2)
CO2: 23 mmol/L (ref 20–29)
Calcium: 9.3 mg/dL (ref 8.7–10.2)
Chloride: 104 mmol/L (ref 96–106)
Creatinine, Ser: 0.82 mg/dL (ref 0.57–1.00)
Globulin, Total: 3.4 g/dL (ref 1.5–4.5)
Glucose: 104 mg/dL — ABNORMAL HIGH (ref 70–99)
Potassium: 4.2 mmol/L (ref 3.5–5.2)
Sodium: 139 mmol/L (ref 134–144)
Total Protein: 7.3 g/dL (ref 6.0–8.5)
eGFR: 89 mL/min/{1.73_m2} (ref >59)

## 2022-02-09 ENCOUNTER — Other Ambulatory Visit: Payer: Self-pay

## 2022-02-09 ENCOUNTER — Ambulatory Visit (INDEPENDENT_AMBULATORY_CARE_PROVIDER_SITE_OTHER): Payer: 59 | Admitting: Urology

## 2022-02-09 ENCOUNTER — Encounter: Payer: Self-pay | Admitting: Urology

## 2022-02-09 ENCOUNTER — Other Ambulatory Visit: Payer: Self-pay | Admitting: Urology

## 2022-02-09 VITALS — BP 149/85 | HR 123

## 2022-02-09 DIAGNOSIS — N200 Calculus of kidney: Secondary | ICD-10-CM | POA: Diagnosis not present

## 2022-02-09 LAB — SPECIMEN STATUS REPORT

## 2022-02-09 LAB — TSH: TSH: 0.382 u[IU]/mL — ABNORMAL LOW (ref 0.450–4.500)

## 2022-02-09 MED ORDER — OXYCODONE-ACETAMINOPHEN 5-325 MG PO TABS: 1.0000 | ORAL_TABLET | ORAL | 0 refills | Status: DC | PRN

## 2022-02-09 NOTE — Addendum Note (Signed)
Addended by: Malen Gauze on: 02/09/2022 02:56 PM ? ? Modules accepted: Orders ? ?

## 2022-02-09 NOTE — Progress Notes (Signed)
Surgical Physician Order [facility] [place] ? ?* Scheduling expectation :  03/05/2022 ? ?*Length of Case: 60 minutes ? ?*MD Preforming Case: [name], MD ? ?*Assistant Needed: no ? ?*[facility]ility Preference: [facility] ? ?*Clearance needed: no ? ?*Anticoagulation Instructions: Hold all anticoagulants ? ?*Aspirin Instructions: Ok to continue Aspirin ? ?-Admit type: OUTpatient ? ?-Anesthesia: General ? ?-Use Standing Orders:  NA ? ?*Diagnosis:  bilateral renal calculi ? ?*Procedure: bilateral Ureteroscopy w/laser lithotripsy & stent placement (37902) ? ?Additional orders: N/A ? ?-Equipment: Holmium laser, C-Arm, digital u scope ?-VTE Prophylaxis Standing Order SCD?s    ?   ?Other:  ? ?-Standing Lab Orders Per Anesthesia   ? ?Lab other: None ? ?-Standing Test orders EKG/Chest x-ray per Anesthesia      ? ?Test other:  ? ?- Medications:  Ancef 2gm IV ? ?-Other orders:  Ok to proceed with Ancef PCN allergy reviewed ? ?*Post-op visit Date/Instructions:  1 week cysto stent removal  ?

## 2022-02-09 NOTE — Progress Notes (Signed)
02/09/2022 2:47 PM   [name] Jul 27, 1976 [id number]  Referring provider: Tylene Fantasia., PA-C [id number] [place],  [place] [address]  nephrolithiasis   HPI: [name] is a 45yo here for followup for nephrolithiasis. She has intermittent right flank pain for the past week. Renal US from 10/2021 shows a 1.2cm left renal calculus and a 1mm right renal calculus. She denies any worsening LUTS. No other complaints today   PMH: Past Medical History:  Diagnosis Date   Asthma    COPD (chronic obstructive pulmonary disease) (HCC)    sees PCP   Depression    GERD (gastroesophageal reflux disease)    History of kidney stones    Hypertension    pt. denies  never taken meds   Pneumonia    PONV (postoperative nausea and vomiting)     Surgical History: Past Surgical History:  Procedure Laterality Date   ABDOMINAL HYSTERECTOMY     CHOLECYSTECTOMY     CYSTOSCOPY W/ RETROGRADES  08/04/2011   Procedure: CYSTOSCOPY WITH RETROGRADE PYELOGRAM;  Surgeon: Ky Barban;  Location: [facility];  Service: Urology;  Laterality: Right;  Right Retrograde   CYSTOSCOPY WITH RETROGRADE PYELOGRAM, URETEROSCOPY AND STENT PLACEMENT Left 07/24/2021   Procedure: CYSTOSCOPY WITH RETROGRADE PYELOGRAM, URETEROSCOPY AND STENT PLACEMENT;  Surgeon: Malen Gauze, MD;  Location: [facility];  Service: Urology;  Laterality: Left;   HOLMIUM LASER APPLICATION N/A 04/15/2018   Procedure: HOLMIUM LASER APPLICATION;  Surgeon: Crist Fat, MD;  Location: [facility];  Service: Urology;  Laterality: N/A;   HOLMIUM LASER APPLICATION Left 07/24/2021   Procedure: HOLMIUM LASER APPLICATION;  Surgeon: Malen Gauze, MD;  Location: [facility];  Service: Urology;  Laterality: Left;   LITHOTRIPSY     NEPHROLITHOTOMY Right 04/15/2018   Procedure: RIGHT NEPHROLITHOTOMY PERCUTANEOUS WITH ACCESS;  Surgeon: Crist Fat, MD;  Location: [facility];  Service: Urology;  Laterality: Right;   NEPHROLITHOTOMY  Right 12/29/2018   Procedure: NEPHROLITHOTOMY PERCUTANEOUS;  Surgeon: Crist Fat, MD;  Location: [facility];  Service: Urology;  Laterality: Right;   PERCUTANEOUS NEPHROLITHOTRIPSY  2012   both sides kidney    TUBAL LIGATION      Home Medications:  Allergies as of 02/09/2022       Reactions   Hydrocodone Nausea And Vomiting   Morphine And Related Hives, Other (See Comments)   Welps   Vicodin [hydrocodone-acetaminophen] Nausea And Vomiting        Medication List        Accurate as of February 09, 2022  2:47 PM. If you have any questions, ask your nurse or doctor.          albuterol 108 (90 Base) MCG/ACT inhaler Commonly known as: VENTOLIN HFA Inhale 2 puffs into the lungs every 4 (four) hours as needed for wheezing or shortness of breath.   B-12 PO Take 1 tablet by mouth daily.   cephALEXin 500 MG capsule Commonly known as: Keflex Take 1 capsule (500 mg total) by mouth in the morning and at bedtime. Please start medication 3 days prior to your surgery date.   cetirizine 10 MG tablet Commonly known as: ZYRTEC Take 10 mg by mouth daily as needed.   cholecalciferol 25 MCG (1000 UNIT) tablet Commonly known as: VITAMIN D3 Take 1,000 Units by mouth daily.   cyclobenzaprine 5 MG tablet Commonly known as: FLEXERIL TAKE ONE TABLET BY MOUTH IN THE MORNING AND AT BEDTIME   Fish Oil 1000 MG  Caps Take 1,000 mg by mouth in the morning and at bedtime.   ibuprofen 800 MG tablet Commonly known as: ADVIL Take 800 mg by mouth every 8 (eight) hours as needed for moderate pain.   multivitamin with minerals Tabs tablet Take 1 tablet by mouth daily.   ondansetron 4 MG disintegrating tablet Commonly known as: Zofran ODT Take 1 tablet (4 mg total) by mouth every 8 (eight) hours as needed.   oxyCODONE-acetaminophen 5-325 MG tablet Commonly known as: PERCOCET/ROXICET Take 1 tablet by mouth every 4 (four) hours as needed for severe pain.   Potassium Citrate 15 MEQ (1620 MG)  Tbcr Take 1 tablet by mouth in the morning and at bedtime.        Allergies:  Allergies  Allergen Reactions   Hydrocodone Nausea And Vomiting   Morphine And Related Hives and Other (See Comments)    Welps   Vicodin [Hydrocodone-Acetaminophen] Nausea And Vomiting    Family History: Family History  Problem Relation Age of Onset   COPD Mother    COPD Father    Heart attack Father     Social History:  reports that she quit smoking about 12 months ago. Her smoking use included cigarettes. She has a 9.00 pack-year smoking history. She has never used smokeless tobacco. She reports that she does not drink alcohol and does not use drugs.  ROS: All other review of systems were reviewed and are negative except what is noted above in HPI  Physical Exam: BP (!) 149/85    Pulse (!) 123    LMP 11/05/2015   Constitutional:  Alert and oriented, No acute distress. HEENT: [place] AT, moist mucus membranes.  Trachea midline, no masses. Cardiovascular: No clubbing, cyanosis, or edema. Respiratory: Normal respiratory effort, no increased work of breathing. GI: Abdomen is soft, nontender, nondistended, no abdominal masses GU: No CVA tenderness.  Lymph: No cervical or inguinal lymphadenopathy. Skin: No rashes, bruises or suspicious lesions. Neurologic: Grossly intact, no focal deficits, moving all 4 extremities. Psychiatric: Normal mood and affect.  Laboratory Data: Lab Results  Component Value Date   WBC 9.3 02/06/2022   HGB 13.9 02/06/2022   HCT 41.6 02/06/2022   MCV 91 02/06/2022   PLT 304 02/06/2022    Lab Results  Component Value Date   CREATININE 0.82 02/06/2022    No results found for: PSA  No results found for: TESTOSTERONE  Lab Results  Component Value Date   HGBA1C  01/20/2008    5.6 (NOTE)   The ADA recommends the following therapeutic goals for glycemic   control related to Hgb A1C measurement:   Goal of Therapy:   < 7.0% Hgb A1C   Action Suggested:  > 8.0% Hgb A1C    Ref:  Diabetes Care, 22, Suppl. 1, 1999    Urinalysis    Component Value Date/Time   COLORURINE YELLOW 10/12/2019 0727   APPEARANCEUR Clear 09/12/2021 1115   LABSPEC 1.012 10/12/2019 0727   PHURINE 6.0 10/12/2019 0727   GLUCOSEU Negative 09/12/2021 1115   HGBUR MODERATE (A) 10/12/2019 0727   BILIRUBINUR Negative 09/12/2021 1115   KETONESUR NEGATIVE 10/12/2019 0727   PROTEINUR Negative 09/12/2021 1115   PROTEINUR NEGATIVE 10/12/2019 0727   UROBILINOGEN 0.2 02/12/2015 1932   NITRITE Negative 09/12/2021 1115   NITRITE NEGATIVE 10/12/2019 0727   LEUKOCYTESUR Negative 09/12/2021 1115   LEUKOCYTESUR TRACE (A) 10/12/2019 0727    Lab Results  Component Value Date   LABMICR Comment 09/12/2021   WBCUA >30 (A) 08/06/2021  LABEPIT >10 (A) 08/06/2021   BACTERIA Few 08/06/2021    Pertinent Imaging: Renal US 11/11/2022: Images reviewed and discussed with the patient  Results for orders placed during the hospital encounter of 07/02/21  DG Abd 1 View  Narrative CLINICAL DATA:  Left-sided flank pain.  EXAM: ABDOMEN - 1 VIEW  COMPARISON:  Ultrasound renal 07/02/2021, CT renal 10/10/2019  FINDINGS: Right upper quadrant surgical clips. The bowel gas pattern is normal. No radio-opaque calculi or other significant radiographic abnormality are seen.  IMPRESSION: Negative radiograph.  These results and ultrasound renal results from same day will be called to the ordering clinician or representative by the Radiologist Assistant, and communication documented in the PACS or [employer].   Electronically Signed By: Tish Frederickson M.D. On: 07/03/2021 21:41  No results found for this or any previous visit.  No results found for this or any previous visit.  No results found for this or any previous visit.  Results for orders placed during the hospital encounter of 11/11/21  Ultrasound renal complete  Narrative CLINICAL DATA:  Nephrolithiasis follow-up.  EXAM: RENAL  / URINARY TRACT ULTRASOUND COMPLETE  COMPARISON:  August 28, 2021  FINDINGS: Right Kidney:  Renal measurements: 12.9 cm x 5.3 cm x 6.6 cm = volume: 235.72 mL. Echogenicity within normal limits. A 9 mm shadowing echogenic renal stone is seen within the lower pole of the right kidney. No mass or hydronephrosis visualized.  Left Kidney:  Renal measurements: 13.2 cm x 6.2 cm x 7.2 cm = volume: 309.30 mL. Echogenicity within normal limits. A 12 mm shadowing echogenic renal stone is seen within the lower pole of the left kidney. No mass or hydronephrosis visualized.  Bladder:  Appears normal for degree of bladder distention. The bilateral ureteral jets are not visualized.  Other:  None.  IMPRESSION: Bilateral nonobstructing renal calculi.   Electronically Signed By: Aram Candela M.D. On: 11/12/2021 21:02  No results found for this or any previous visit.  No results found for this or any previous visit.  Results for orders placed during the hospital encounter of 10/09/19  CT Renal Stone Study  Narrative CLINICAL DATA:  Right flank pain  EXAM: CT ABDOMEN AND PELVIS WITHOUT CONTRAST  TECHNIQUE: Multidetector CT imaging of the abdomen and pelvis was performed following the standard protocol without IV contrast.  COMPARISON:  Most recent prior Apr 16, 2018  FINDINGS: Lower chest: The visualized heart size within normal limits. No pericardial fluid/thickening.  There is a moderate paraesophageal hernia.  The visualized portions of the lungs are clear.  Hepatobiliary: Although limited due to the lack of intravenous contrast, normal in appearance without gross focal abnormality. The patient is status post cholecystectomy. No biliary ductal dilation.  Pancreas:  Unremarkable.  No surrounding inflammatory changes.  Spleen: Normal in size. Although limited due to the lack of intravenous contrast, normal in appearance.  Adrenals/Urinary Tract: Both  adrenal glands appear normal. Again noted is bilateral nephrocalcinosis. There is new severe right pelvicaliectasis and ureterectasis down to the level of the distal ureter where there is a 7 mm calculus. There is significant right perinephric and periureteral stranding. Within the left kidney there several renal calculi, the largest within the lower pole measuring 8 mm. There is mild proximal periureteral stranding. No distal left ureteral calculi are seen. No bladder calculi are noted.  Stomach/Bowel: The stomach, small bowel, and colon are normal in appearance. No inflammatory changes or obstructive findings. appendix is normal.  Vascular/Lymphatic: There are no enlarged  abdominal or pelvic lymph nodes. No significant gross vascular findings are present.  Reproductive:  Other: No evidence of abdominal wall mass or hernia.  Musculoskeletal: No acute or significant osseous findings.  IMPRESSION: 1. Obstructing 7 mm right distal ureteral calculus causing severe right hydronephrosis with perinephric/ureteral inflammatory changes. 2. Nonobstructing left renal calculi the largest measuring 8 mm in the lower pole with mild proximal inflammatory changes which could be due to ureteritis. 3. Findings suggestive of bilateral nephrocalcinosis.   Electronically Signed By: Jonna Clark M.D. On: 10/10/2019 01:35   Assessment & Plan:    1. Kidney stones -We discussed the management of kidney stones. These options include observation, ureteroscopy, shockwave lithotripsy (ESWL) and percutaneous nephrolithotomy (PCNL). We discussed which options are relevant to the patient's stone(s). We discussed the natural history of kidney stones as well as the complications of untreated stones and the impact on quality of life without treatment as well as with each of the above listed treatments. We also discussed the efficacy of each treatment in its ability to clear the stone burden. With any of these  management options I discussed the signs and symptoms of infection and the need for emergent treatment should these be experienced. For each option we discussed the ability of each procedure to clear the patient of their stone burden.   For observation I described the risks which include but are not limited to silent renal damage, life-threatening infection, need for emergent surgery, failure to pass stone and pain.   For ureteroscopy I described the risks which include bleeding, infection, damage to contiguous structures, positioning injury, ureteral stricture, ureteral avulsion, ureteral injury, need for prolonged ureteral stent, inability to perform ureteroscopy, need for an interval procedure, inability to clear stone burden, stent discomfort/pain, heart attack, stroke, pulmonary embolus and the inherent risks with general anesthesia.   For shockwave lithotripsy I described the risks which include arrhythmia, kidney contusion, kidney hemorrhage, need for transfusion, pain, inability to adequately break up stone, inability to pass stone fragments, Steinstrasse, infection associated with obstructing stones, need for alternate surgical procedure, need for repeat shockwave lithotripsy, MI, CVA, PE and the inherent risks with anesthesia/conscious sedation.   For PCNL I described the risks including positioning injury, pneumothorax, hydrothorax, need for chest tube, inability to clear stone burden, renal laceration, arterial venous fistula or malformation, need for embolization of kidney, loss of kidney or renal function, need for repeat procedure, need for prolonged nephrostomy tube, ureteral avulsion, MI, CVA, PE and the inherent risks of general anesthesia.   - The patient would like to proceed with bilateral ureteroscopic stone extraction   No follow-ups on file.  [name], MD  [facility] [place]

## 2022-02-09 NOTE — Patient Instructions (Signed)
Ureteroscopy °Ureteroscopy is a procedure to check for and treat problems inside part of the urinary tract. In this procedure, a thin, flexible tube with a light at the end (ureteroscope) is used to look at the inside of the kidneys and the ureters. The ureters are the tubes that carry urine from the kidneys to the bladder. The ureteroscope is inserted into one or both of the ureters. °You may need this procedure if you have frequent urinary tract infections (UTIs), blood in your urine, or a stone in one of your ureters. A ureteroscopy can be done: °To find the cause of urine blockage in a ureter and to evaluate other abnormalities inside the ureters or kidneys. °To remove stones. °To remove or treat growths of tissue (polyps), abnormal tissue, and some types of tumors. °To remove a tissue sample and check it for disease under a microscope (biopsy). °Tell a health care provider about: °Any allergies you have. °All medicines you are taking, including vitamins, herbs, eye drops, creams, and over-the-counter medicines. °Any problems you or family members have had with anesthetic medicines. °Any blood disorders you have. °Any surgeries you have had. °Any medical conditions you have. °Whether you are pregnant or may be pregnant. °What are the risks? °Generally, this is a safe procedure. However, problems may occur, including: °Bleeding. °Infection. °Allergic reactions to medicines. °Scarring that narrows the ureter (stricture). °Creating a hole in the ureter (perforation). °What happens before the procedure? °Staying hydrated °Follow instructions from your health care provider about hydration, which may include: °Up to 2 hours before the procedure - you may continue to drink clear liquids, such as water, clear fruit juice, black coffee, and plain tea. ° °Eating and drinking restrictions °Follow instructions from your health care provider about eating and drinking, which may include: °8 hours before the procedure - stop  eating heavy meals or foods, such as meat, fried foods, or fatty foods. °6 hours before the procedure - stop eating light meals or foods, such as toast or cereal. °6 hours before the procedure - stop drinking milk or drinks that contain milk. °2 hours before the procedure - stop drinking clear liquids. °Medicines °Ask your health care provider about: °Changing or stopping your regular medicines. This is especially important if you are taking diabetes medicines or blood thinners. °Taking medicines such as aspirin and ibuprofen. These medicines can thin your blood. Do not take these medicines unless your health care provider tells you to take them. °Taking over-the-counter medicines, vitamins, herbs, and supplements. °General instructions °Do not use any products that contain nicotine or tobacco for at least 4 weeks before the procedure. These products include cigarettes, e-cigarettes, and chewing tobacco. If you need help quitting, ask your health care provider. °You may have a urine sample taken to check for infection. °Plan to have someone take you home from the hospital or clinic. °If you will be going home right after the procedure, plan to have someone with you for 24 hours. °Ask your health care provider what steps will be taken to help prevent infection. These may include: °Washing skin with a germ-killing soap. °Receiving antibiotic medicine. °What happens during the procedure? ° °An IV will be inserted into one of your veins. °You will be given one or more of the following: °A medicine to help you relax (sedative). °A medicine to make you fall asleep (general anesthetic). °A medicine that is injected into your spine to numb the area below and slightly above the injection site (spinal anesthetic). °The   part of your body that drains urine from your bladder (urethra) will be cleaned with a germ-killing solution. °The ureteroscope will be passed through your urethra into your bladder. °A salt-water solution will  be sent through the ureteroscope to fill your bladder. This will help the health care provider see the openings of your ureters more clearly. °The ureteroscope will be passed into your ureter. °If a growth is found, a biopsy may be done. °If a stone is found, it may be removed through the ureteroscope, or the stone may be broken up using a laser, shock waves, or electrical energy. °In some cases, if the ureter is too small, a tube may be inserted that keeps the ureter open (ureteral stent). The stent may be left in place for 1 or 2 weeks to keep the ureter open, and then the ureteroscopy procedure will be done. °The scope will be removed, and your bladder will be emptied. °The procedure may vary among health care providers and hospitals. °What can I expect after the procedure? °After your procedure, it is common to have: °Your blood pressure, heart rate, breathing rate, and blood oxygen level monitored until you leave the hospital or clinic. °A burning sensation when you urinate. You may be asked to urinate. °Blood in your urine. °Mild discomfort in your bladder area or kidney area when urinating. °A need to urinate more often or urgently. °Follow these instructions at home: °Medicines °Take over-the-counter and prescription medicines only as told by your health care provider. °If you were prescribed an antibiotic medicine, take it as told by your health care provider. Do not stop taking the antibiotic even if you start to feel better. °General instructions ° °If you were given a sedative during the procedure, it can affect you for several hours. Do not drive or operate machinery until your health care provider says that it is safe. °To relieve burning, take a warm bath or hold a warm washcloth over your groin. °Drink enough fluid to keep your urine pale yellow. °Drink two 8-ounce (237 mL) glasses of water every hour for the first 2 hours after you get home. °Continue to drink water often at home. °You can eat what  you normally do. °Keep all follow-up visits as told by your health care provider. This is important. °If you had a ureteral stent placed, ask your health care provider when you need to return to have it removed. °Contact a health care provider if you have: °Chills or a fever. °Burning pain for longer than 24 hours after the procedure. °Blood in your urine for longer than 24 hours after the procedure. °Get help right away if you have: °Large amounts of blood in your urine. °Blood clots in your urine. °Severe pain. °Chest pain or trouble breathing. °The feeling of a full bladder and you are unable to urinate. °These symptoms may represent a serious problem that is an emergency. Do not wait to see if the symptoms will go away. Get medical help right away. Call your local emergency services (911 in the [place] °Summary °Ureteroscopy is a procedure to check for and treat problems inside part of the urinary tract. °In this procedure, a thin, flexible tube with a light at the end (ureteroscope) is used to look at the inside of the kidneys and the ureters. °You may need this procedure if you have frequent urinary tract infections (UTIs), blood in your urine, or a stone in a ureter. °This information is not intended to replace advice given to   you by your health care provider. Make sure you discuss any questions you have with your health care provider. °Document Revised: 07/30/2021 Document Reviewed: 08/23/2019 °Elsevier Patient Education © 2022 [employer] ° °

## 2022-02-09 NOTE — Addendum Note (Signed)
Addended byGustavus Messing on: 02/09/2022 05:10 PM ? ? Modules accepted: Orders ? ?

## 2022-02-09 NOTE — H&P (View-Only) (Signed)
? ?02/09/2022 ?2:47 PM  ? ?[name] ?[birth year] ?[id number] ? ?Referring provider: Tylene Fantasia., PA-C ?[id number] ?[address] ?[place],  [place] [address] ? ?nephrolithiasis ? ? ?HPI: ?[name] is a 45yo here for followup for nephrolithiasis. She has intermittent right flank pain for the past week. Renal US from 10/2021 shows a 1.2cm left renal calculus and a 19mm right renal calculus. She denies any worsening LUTS. No other complaints today ? ? ?PMH: ?Past Medical History:  ?Diagnosis Date  ? Asthma   ? COPD (chronic obstructive pulmonary disease) (HCC)   ? sees PCP  ? Depression   ? GERD (gastroesophageal reflux disease)   ? History of kidney stones   ? Hypertension   ? pt. denies  never taken meds  ? Pneumonia   ? PONV (postoperative nausea and vomiting)   ? ? ?Surgical History: ?Past Surgical History:  ?Procedure Laterality Date  ? ABDOMINAL HYSTERECTOMY    ? CHOLECYSTECTOMY    ? CYSTOSCOPY W/ RETROGRADES  08/04/2011  ? Procedure: CYSTOSCOPY WITH RETROGRADE PYELOGRAM;  Surgeon: Ky Barban;  Location: [facility];  Service: Urology;  Laterality: Right;  Right Retrograde  ? CYSTOSCOPY WITH RETROGRADE PYELOGRAM, URETEROSCOPY AND STENT PLACEMENT Left 07/24/2021  ? Procedure: CYSTOSCOPY WITH RETROGRADE PYELOGRAM, URETEROSCOPY AND STENT PLACEMENT;  Surgeon: Malen Gauze, MD;  Location: [facility];  Service: Urology;  Laterality: Left;  ? HOLMIUM LASER APPLICATION N/A 04/15/2018  ? Procedure: HOLMIUM LASER APPLICATION;  Surgeon: Crist Fat, MD;  Location: [facility];  Service: Urology;  Laterality: N/A;  ? HOLMIUM LASER APPLICATION Left 07/24/2021  ? Procedure: HOLMIUM LASER APPLICATION;  Surgeon: Malen Gauze, MD;  Location: [facility];  Service: Urology;  Laterality: Left;  ? LITHOTRIPSY    ? NEPHROLITHOTOMY Right 04/15/2018  ? Procedure: RIGHT NEPHROLITHOTOMY PERCUTANEOUS WITH ACCESS;  Surgeon: Crist Fat, MD;  Location: [facility];  Service: Urology;  Laterality: Right;  ? NEPHROLITHOTOMY  Right 12/29/2018  ? Procedure: NEPHROLITHOTOMY PERCUTANEOUS;  Surgeon: Crist Fat, MD;  Location: [facility];  Service: Urology;  Laterality: Right;  ? PERCUTANEOUS NEPHROLITHOTRIPSY  2012  ? both sides kidney   ? TUBAL LIGATION    ? ? ?Home Medications:  ?Allergies as of 02/09/2022   ? ?   Reactions  ? Hydrocodone Nausea And Vomiting  ? Morphine And Related Hives, Other (See Comments)  ? Welps  ? Vicodin [hydrocodone-acetaminophen] Nausea And Vomiting  ? ?  ? ?  ?Medication List  ?  ? ?  ? Accurate as of February 09, 2022  2:47 PM. If you have any questions, ask your nurse or doctor.  ?  ?  ? ?  ? ?albuterol 108 (90 Base) MCG/ACT inhaler ?Commonly known as: VENTOLIN HFA ?Inhale 2 puffs into the lungs every 4 (four) hours as needed for wheezing or shortness of breath. ?  ?B-12 PO ?Take 1 tablet by mouth daily. ?  ?cephALEXin 500 MG capsule ?Commonly known as: Keflex ?Take 1 capsule (500 mg total) by mouth in the morning and at bedtime. Please start medication 3 days prior to your surgery date. ?  ?cetirizine 10 MG tablet ?Commonly known as: ZYRTEC ?Take 10 mg by mouth daily as needed. ?  ?cholecalciferol 25 MCG (1000 UNIT) tablet ?Commonly known as: VITAMIN D3 ?Take 1,000 Units by mouth daily. ?  ?cyclobenzaprine 5 MG tablet ?Commonly known as: FLEXERIL ?TAKE ONE TABLET BY MOUTH IN THE MORNING AND AT BEDTIME ?  ?Fish Oil 1000 MG  Caps ?Take 1,000 mg by mouth in the morning and at bedtime. ?  ?ibuprofen 800 MG tablet ?Commonly known as: ADVIL ?Take 800 mg by mouth every 8 (eight) hours as needed for moderate pain. ?  ?multivitamin with minerals Tabs tablet ?Take 1 tablet by mouth daily. ?  ?ondansetron 4 MG disintegrating tablet ?Commonly known as: Zofran ODT ?Take 1 tablet (4 mg total) by mouth every 8 (eight) hours as needed. ?  ?oxyCODONE-acetaminophen 5-325 MG tablet ?Commonly known as: PERCOCET/ROXICET ?Take 1 tablet by mouth every 4 (four) hours as needed for severe pain. ?  ?Potassium Citrate 15 MEQ (1620 MG)  Tbcr ?Take 1 tablet by mouth in the morning and at bedtime. ?  ? ?  ? ? ?Allergies:  ?Allergies  ?Allergen Reactions  ? Hydrocodone Nausea And Vomiting  ? Morphine And Related Hives and Other (See Comments)  ?  Welps  ? Vicodin [Hydrocodone-Acetaminophen] Nausea And Vomiting  ? ? ?Family History: ?Family History  ?Problem Relation Age of Onset  ? COPD Mother   ? COPD Father   ? Heart attack Father   ? ? ?Social History:  reports that she quit smoking about 12 months ago. Her smoking use included cigarettes. She has a 9.00 pack-year smoking history. She has never used smokeless tobacco. She reports that she does not drink alcohol and does not use drugs. ? ?ROS: ?All other review of systems were reviewed and are negative except what is noted above in HPI ? ?Physical Exam: ?BP (!) 149/85   Pulse (!) 123   LMP 11/05/2015   ?Constitutional:  Alert and oriented, No acute distress. ?HEENT: [place] AT, moist mucus membranes.  Trachea midline, no masses. ?Cardiovascular: No clubbing, cyanosis, or edema. ?Respiratory: Normal respiratory effort, no increased work of breathing. ?GI: Abdomen is soft, nontender, nondistended, no abdominal masses ?GU: No CVA tenderness.  ?Lymph: No cervical or inguinal lymphadenopathy. ?Skin: No rashes, bruises or suspicious lesions. ?Neurologic: Grossly intact, no focal deficits, moving all 4 extremities. ?Psychiatric: Normal mood and affect. ? ?Laboratory Data: ?Lab Results  ?Component Value Date  ? WBC 9.3 02/06/2022  ? HGB 13.9 02/06/2022  ? HCT 41.6 02/06/2022  ? MCV 91 02/06/2022  ? PLT 304 02/06/2022  ? ? ?Lab Results  ?Component Value Date  ? CREATININE 0.82 02/06/2022  ? ? ?No results found for: PSA ? ?No results found for: TESTOSTERONE ? ?Lab Results  ?Component Value Date  ? HGBA1C  01/20/2008  ?  5.6 ?(NOTE)   The ADA recommends the following therapeutic goals for glycemic   control related to Hgb A1C measurement:   Goal of Therapy:   < 7.0% Hgb A1C   Action Suggested:  > 8.0% Hgb A1C    Ref:  Diabetes Care, 22, Suppl. 1, 1999  ? ? ?Urinalysis ?   ?Component Value Date/Time  ? COLORURINE YELLOW 10/12/2019 0727  ? APPEARANCEUR Clear 09/12/2021 1115  ? LABSPEC 1.012 10/12/2019 0727  ? PHURINE 6.0 10/12/2019 0727  ? GLUCOSEU Negative 09/12/2021 1115  ? HGBUR MODERATE (A) 10/12/2019 0727  ? BILIRUBINUR Negative 09/12/2021 1115  ? KETONESUR NEGATIVE 10/12/2019 0727  ? PROTEINUR Negative 09/12/2021 1115  ? PROTEINUR NEGATIVE 10/12/2019 0727  ? UROBILINOGEN 0.2 02/12/2015 1932  ? NITRITE Negative 09/12/2021 1115  ? NITRITE NEGATIVE 10/12/2019 0727  ? LEUKOCYTESUR Negative 09/12/2021 1115  ? LEUKOCYTESUR TRACE (A) 10/12/2019 0727  ? ? ?Lab Results  ?Component Value Date  ? LABMICR Comment 09/12/2021  ? WBCUA >30 (A) 08/06/2021  ?  LABEPIT >10 (A) 08/06/2021  ? BACTERIA Few 08/06/2021  ? ? ?Pertinent Imaging: ?Renal US 11/11/2022: Images reviewed and discussed with the patient  ?Results for orders placed during the hospital encounter of 07/02/21 ? ?DG Abd 1 View ? ?Narrative ?CLINICAL DATA:  Left-sided flank pain. ? ?EXAM: ?ABDOMEN - 1 VIEW ? ?COMPARISON:  Ultrasound renal 07/02/2021, CT renal 10/10/2019 ? ?FINDINGS: ?Right upper quadrant surgical clips. The bowel gas pattern is ?normal. No radio-opaque calculi or other significant radiographic ?abnormality are seen. ? ?IMPRESSION: ?Negative radiograph. ? ?These results and ultrasound renal results from same day will be ?called to the ordering clinician or representative by the ?Printmaker, and communication documented in the PACS or ?[facility] Dashboard. ? ? ?Electronically Signed ?By: Tish Frederickson M.D. ?On: 07/03/2021 21:41 ? ?No results found for this or any previous visit. ? ?No results found for this or any previous visit. ? ?No results found for this or any previous visit. ? ?Results for orders placed during the hospital encounter of 11/11/21 ? ?Ultrasound renal complete ? ?Narrative ?CLINICAL DATA:  Nephrolithiasis follow-up. ? ?EXAM: ?RENAL  / URINARY TRACT ULTRASOUND COMPLETE ? ?COMPARISON:  August 28, 2021 ? ?FINDINGS: ?Right Kidney: ? ?Renal measurements: 12.9 cm x 5.3 cm x 6.6 cm = volume: 235.72 mL. ?Echogenicity within normal limits. A

## 2022-02-10 ENCOUNTER — Telehealth: Payer: Self-pay

## 2022-02-10 LAB — URINALYSIS, ROUTINE W REFLEX MICROSCOPIC
Bilirubin, UA: NEGATIVE
Glucose, UA: NEGATIVE
Ketones, UA: NEGATIVE
Nitrite, UA: NEGATIVE
Protein,UA: NEGATIVE
Specific Gravity, UA: 1.02 (ref 1.005–1.030)
Urobilinogen, Ur: 0.2 mg/dL (ref 0.2–1.0)
pH, UA: 7 (ref 5.0–7.5)

## 2022-02-10 LAB — MICROSCOPIC EXAMINATION: Renal Epithel, UA: NONE SEEN /hpf

## 2022-02-10 NOTE — Progress Notes (Signed)
[facility] Posting Form  ? ?Surgery Date/Time: Date: 03/02/2022 ? ?Surgeon: Dr. Nicolette Bang, MD ? ?Surgery Location: Day Surgery ? ?Inpt ( No  )   Outpt (Yes)   Obs ( No  )  ? ?Diagnosis: N20.0 Bilateral Renal Calculi ? ?-CPT: [id number] ? ?Surgery: Bilateral Ureteroscopy with Laser lithotripsy and stent placement with bilateral retrograde pyelograms ? ?Stop Anticoagulations: Yes and ok to continue ASA ? ?Cardiac/Medical/Pulmonary Clearance needed: no ? ?*Orders entered into EPIC  Date: 02/10/22  ? ?*Case booked in [place]  Date: 02/10/22 ? ?*Notified pt of Surgery: Date: 02/10/22 ? ?PRE-OP UA & CX: no ? ?*Placed into Prior Authorization Work Que Date: 02/10/22 ? ? ?Assistant/laser/rep:No ? ? ? ? ? ? ? ? ? ? ? ? ? ? ? ?

## 2022-02-10 NOTE — Telephone Encounter (Signed)
I spoke with [name]. We have discussed possible surgery dates and Monday April 3rd, 2023 was agreed upon by all parties. Patient given information about surgery date, what to expect pre-operatively and post operatively.  ?We discussed that a Pre-Admission Testing office will be calling to set up the pre-op visit that will take place prior to surgery, and that these appointments are typically done over the phone with a Pre-Admissions RN. ? Informed patient that our office will communicate any additional care to be provided after surgery. Patients questions or concerns were discussed during our call. Advised to call our office should there be any additional information, questions or concerns that arise. Patient verbalized understanding.  ? ?

## 2022-02-11 ENCOUNTER — Telehealth: Payer: Self-pay | Admitting: Nurse Practitioner

## 2022-02-11 NOTE — Telephone Encounter (Signed)
Patient aware.

## 2022-02-11 NOTE — Telephone Encounter (Signed)
All good except Thyroid test not complete yet. ?

## 2022-02-11 NOTE — Telephone Encounter (Signed)
Je coverage, please review and advise ?

## 2022-02-25 LAB — T4, FREE

## 2022-02-25 LAB — SPECIMEN STATUS REPORT

## 2022-02-25 NOTE — Patient Instructions (Signed)
? ? ? ? ? ? ? ? [name] ? 02/25/2022  ?  ? @PREFPERIOPPHARMACY @ ? ? Your procedure is scheduled on  03/02/2022. ? ? Report to 05/02/2022 at  0700  A.M. ? ? Call this number if you have problems the morning of surgery: ? [phone] ? ? Remember: ? Do not eat or drink after midnight. ?  ? ?Use your inhaler before you come and bring your rescue inhaler with you. ?  ? Take these medicines the morning of surgery with A SIP OF WATER  ? ?zyrtec, oxycodone(if needed). ?  ? Do not wear jewelry, make-up or nail polish. ? Do not wear lotions, powders, or perfumes, or deodorant. ? Do not shave 48 hours prior to surgery.  Men may shave face and neck. ? Do not bring valuables to the hospital. ? [facility] is not responsible for any belongings or valuables. ? ?Contacts, dentures or bridgework may not be worn into surgery.  Leave your suitcase in the car.  After surgery it may be brought to your room. ? ?For patients admitted to the hospital, discharge time will be determined by your treatment team. ? ?Patients discharged the day of surgery will not be allowed to drive home and must have someone with them for 24 hours.  ? ? ?Special instructions:   DO NOT smoke tobacco or vape for 24 hours before your procedure. ? ?Please read over the following fact sheets that you were given. ?Coughing and Deep Breathing, Surgical Site Infection Prevention, Anesthesia Post-op Instructions, and Care and Recovery After Surgery ?  ? ? ? Ureteral Stent Implantation, Care After ?This sheet gives you information about how to care for yourself after your procedure. Your health care provider may also give you more specific instructions. If you have problems or questions, contact your health care provider. ?What can I expect after the procedure? ?After the procedure, it is common to have: ?Nausea. ?Mild pain when you urinate. You may feel this pain in your lower back or lower abdomen. The pain should stop within a few minutes after you urinate.  This may last for up to 1 week. ?A small amount of blood in your urine for several days. ?Follow these instructions at home: ?Medicines ?Take over-the-counter and prescription medicines only as told by your health care provider. ?If you were prescribed an antibiotic medicine, take it as told by your health care provider. Do not stop taking the antibiotic even if you start to feel better. ?Do not drive for 24 hours if you were given a sedative during your procedure. ?Ask your health care provider if the medicine prescribed to you requires you to avoid driving or using heavy machinery. ?Activity ?Rest as told by your health care provider. ?Avoid sitting for a long time without moving. Get up to take short walks every 1-2 hours. This is important to improve blood flow and breathing. Ask for help if you feel weak or unsteady. ?Return to your normal activities as told by your health care provider. Ask your health care provider what activities are safe for you. ?General instructions ? ?Watch for any blood in your urine. Call your health care provider if the amount of blood in your urine increases. ?If you have a catheter: ?Follow instructions from your health care provider about taking care of your catheter and collection bag. ?Do not take baths, swim, or use a hot tub until your health care provider approves. Ask your health care provider if you may  take showers. You may only be allowed to take sponge baths. ?Drink enough fluid to keep your urine pale yellow. ?Do not use any products that contain nicotine or tobacco, such as cigarettes, e-cigarettes, and chewing tobacco. These can delay healing after surgery. If you need help quitting, ask your health care provider. ?Keep all follow-up visits as told by your health care provider. This is important. ?Contact a health care provider if: ?You have pain that gets worse or does not get better with medicine, especially pain when you urinate. ?You have difficulty urinating. ?You  feel nauseous or you vomit repeatedly during a period of more than 2 days after the procedure. ?Get help right away if: ?Your urine is dark red or has blood clots in it. ?You are leaking urine (have incontinence). ?The end of the stent comes out of your urethra. ?You cannot urinate. ?You have sudden, sharp, or severe pain in your abdomen or lower back. ?You have a fever. ?You have swelling or pain in your legs. ?You have difficulty breathing. ?Summary ?After the procedure, it is common to have mild pain when you urinate that goes away within a few minutes after you urinate. This may last for up to 1 week. ?Watch for any blood in your urine. Call your health care provider if the amount of blood in your urine increases. ?Take over-the-counter and prescription medicines only as told by your health care provider. ?Drink enough fluid to keep your urine pale yellow. ?This information is not intended to replace advice given to you by your health care provider. Make sure you discuss any questions you have with your health care provider. ?Document Revised: 08/23/2018 Document Reviewed: 08/24/2018 ?Elsevier Patient Education ? 2022 [employer] ?General Anesthesia, Adult, Care After ?This sheet gives you information about how to care for yourself after your procedure. Your health care provider may also give you more specific instructions. If you have problems or questions, contact your health care provider. ?What can I expect after the procedure? ?After the procedure, the following side effects are common: ?Pain or discomfort at the IV site. ?Nausea. ?Vomiting. ?Sore throat. ?Trouble concentrating. ?Feeling cold or chills. ?Feeling weak or tired. ?Sleepiness and fatigue. ?Soreness and body aches. These side effects can affect parts of the body that were not involved in surgery. ?Follow these instructions at home: ?For the time period you were told by your health care provider: ? ?Rest. ?Do not participate in activities where  you could fall or become injured. ?Do not drive or use machinery. ?Do not drink alcohol. ?Do not take sleeping pills or medicines that cause drowsiness. ?Do not make important decisions or sign legal documents. ?Do not take care of children on your own. ?Eating and drinking ?Follow any instructions from your health care provider about eating or drinking restrictions. ?When you feel hungry, start by eating small amounts of foods that are soft and easy to digest (bland), such as toast. Gradually return to your regular diet. ?Drink enough fluid to keep your urine pale yellow. ?If you vomit, rehydrate by drinking water, juice, or clear broth. ?General instructions ?If you have sleep apnea, surgery and certain medicines can increase your risk for breathing problems. Follow instructions from your health care provider about wearing your sleep device: ?Anytime you are sleeping, including during daytime naps. ?While taking prescription pain medicines, sleeping medicines, or medicines that make you drowsy. ?Have a responsible adult stay with you for the time you are told. It is important to have someone help  care for you until you are awake and alert. ?Return to your normal activities as told by your health care provider. Ask your health care provider what activities are safe for you. ?Take over-the-counter and prescription medicines only as told by your health care provider. ?If you smoke, do not smoke without supervision. ?Keep all follow-up visits as told by your health care provider. This is important. ?Contact a health care provider if: ?You have nausea or vomiting that does not get better with medicine. ?You cannot eat or drink without vomiting. ?You have pain that does not get better with medicine. ?You are unable to pass urine. ?You develop a skin rash. ?You have a fever. ?You have redness around your IV site that gets worse. ?Get help right away if: ?You have difficulty breathing. ?You have chest pain. ?You have  blood in your urine or stool, or you vomit blood. ?Summary ?After the procedure, it is common to have a sore throat or nausea. It is also common to feel tired. ?Have a responsible adult stay with you f

## 2022-02-26 ENCOUNTER — Other Ambulatory Visit: Payer: Self-pay

## 2022-02-26 ENCOUNTER — Encounter (HOSPITAL_COMMUNITY): Payer: Self-pay

## 2022-02-26 ENCOUNTER — Encounter (HOSPITAL_COMMUNITY)
Admission: RE | Admit: 2022-02-26 | Discharge: 2022-02-26 | Disposition: A | Discharge status: Home / Self Care | Payer: 59 | Source: Ambulatory Visit | Attending: Urology | Admitting: Urology

## 2022-03-02 ENCOUNTER — Ambulatory Visit (HOSPITAL_COMMUNITY)
Admission: RE | Admit: 2022-03-02 | Discharge: 2022-03-02 | Disposition: A | Discharge status: Home / Self Care | Payer: 59 | Source: Ambulatory Visit | Attending: Urology | Admitting: Urology

## 2022-03-02 ENCOUNTER — Ambulatory Visit (HOSPITAL_COMMUNITY): Payer: 59

## 2022-03-02 ENCOUNTER — Encounter (HOSPITAL_COMMUNITY)
Admission: RE | Disposition: A | Discharge status: Home / Self Care | Payer: Self-pay | Source: Ambulatory Visit | Attending: Urology

## 2022-03-02 ENCOUNTER — Ambulatory Visit (HOSPITAL_BASED_OUTPATIENT_CLINIC_OR_DEPARTMENT_OTHER): Payer: 59 | Admitting: Anesthesiology

## 2022-03-02 ENCOUNTER — Ambulatory Visit (HOSPITAL_COMMUNITY): Payer: 59 | Admitting: Anesthesiology

## 2022-03-02 ENCOUNTER — Encounter (HOSPITAL_COMMUNITY): Payer: Self-pay | Admitting: Urology

## 2022-03-02 DIAGNOSIS — K219 Gastro-esophageal reflux disease without esophagitis: Secondary | ICD-10-CM | POA: Diagnosis not present

## 2022-03-02 DIAGNOSIS — Z79899 Other long term (current) drug therapy: Secondary | ICD-10-CM | POA: Diagnosis not present

## 2022-03-02 DIAGNOSIS — J449 Chronic obstructive pulmonary disease, unspecified: Secondary | ICD-10-CM | POA: Insufficient documentation

## 2022-03-02 DIAGNOSIS — Z6841 Body Mass Index (BMI) 40.0 and over, adult: Secondary | ICD-10-CM | POA: Diagnosis not present

## 2022-03-02 DIAGNOSIS — Z87891 Personal history of nicotine dependence: Secondary | ICD-10-CM | POA: Insufficient documentation

## 2022-03-02 DIAGNOSIS — F32A Depression, unspecified: Secondary | ICD-10-CM | POA: Diagnosis not present

## 2022-03-02 DIAGNOSIS — N200 Calculus of kidney: Secondary | ICD-10-CM | POA: Diagnosis not present

## 2022-03-02 DIAGNOSIS — F418 Other specified anxiety disorders: Secondary | ICD-10-CM | POA: Diagnosis not present

## 2022-03-02 DIAGNOSIS — I10 Essential (primary) hypertension: Secondary | ICD-10-CM | POA: Diagnosis not present

## 2022-03-02 DIAGNOSIS — F419 Anxiety disorder, unspecified: Secondary | ICD-10-CM | POA: Insufficient documentation

## 2022-03-02 DIAGNOSIS — Z87442 Personal history of urinary calculi: Secondary | ICD-10-CM | POA: Insufficient documentation

## 2022-03-02 DIAGNOSIS — R69 Illness, unspecified: Secondary | ICD-10-CM | POA: Diagnosis not present

## 2022-03-02 HISTORY — PX: HOLMIUM LASER APPLICATION: SHX5852

## 2022-03-02 HISTORY — PX: STONE EXTRACTION WITH BASKET: SHX5318

## 2022-03-02 HISTORY — PX: CYSTOSCOPY WITH RETROGRADE PYELOGRAM, URETEROSCOPY AND STENT PLACEMENT: SHX5789

## 2022-03-02 SURGERY — CYSTOURETEROSCOPY, WITH RETROGRADE PYELOGRAM AND STENT INSERTION
Anesthesia: General | Site: Ureter | Laterality: Bilateral

## 2022-03-02 MED ORDER — LIDOCAINE HCL (PF) 2 % IJ SOLN
INTRAMUSCULAR | Status: AC
  Filled 2022-03-02: qty 5

## 2022-03-02 MED ORDER — DEXAMETHASONE SODIUM PHOSPHATE 10 MG/ML IJ SOLN
INTRAMUSCULAR | Status: DC | PRN
  Administered 2022-03-02: 5 mg via INTRAVENOUS

## 2022-03-02 MED ORDER — ONDANSETRON HCL 4 MG/2ML IJ SOLN
INTRAMUSCULAR | Status: DC | PRN
  Administered 2022-03-02: 4 mg via INTRAVENOUS

## 2022-03-02 MED ORDER — METOCLOPRAMIDE HCL 5 MG/ML IJ SOLN
INTRAMUSCULAR | Status: AC
  Filled 2022-03-02: qty 2

## 2022-03-02 MED ORDER — LIDOCAINE 2% (20 MG/ML) 5 ML SYRINGE
INTRAMUSCULAR | Status: DC | PRN
  Administered 2022-03-02: 100 mg via INTRAVENOUS

## 2022-03-02 MED ORDER — FENTANYL CITRATE (PF) 250 MCG/5ML IJ SOLN
INTRAMUSCULAR | Status: DC | PRN
  Administered 2022-03-02: 50 ug via INTRAVENOUS
  Administered 2022-03-02: 100 ug via INTRAVENOUS

## 2022-03-02 MED ORDER — ONDANSETRON HCL 4 MG/2ML IJ SOLN
INTRAMUSCULAR | Status: AC
  Filled 2022-03-02: qty 2

## 2022-03-02 MED ORDER — METOCLOPRAMIDE HCL 5 MG/ML IJ SOLN
10.0000 mg | Freq: Once | INTRAMUSCULAR | Status: AC
  Administered 2022-03-02: 10 mg via INTRAVENOUS

## 2022-03-02 MED ORDER — LACTATED RINGERS IV SOLN: INTRAVENOUS | Status: DC

## 2022-03-02 MED ORDER — FENTANYL CITRATE PF 50 MCG/ML IJ SOSY
25.0000 ug | PREFILLED_SYRINGE | INTRAMUSCULAR | Status: DC | PRN
  Administered 2022-03-02: 50 ug via INTRAVENOUS
  Filled 2022-03-02: qty 1

## 2022-03-02 MED ORDER — ORAL CARE MOUTH RINSE: 15.0000 mL | Freq: Once | OROMUCOSAL | Status: AC

## 2022-03-02 MED ORDER — CHLORHEXIDINE GLUCONATE 0.12 % MT SOLN
15.0000 mL | Freq: Once | OROMUCOSAL | Status: AC
  Administered 2022-03-02: 15 mL via OROMUCOSAL

## 2022-03-02 MED ORDER — FENTANYL CITRATE (PF) 250 MCG/5ML IJ SOLN
INTRAMUSCULAR | Status: AC
  Filled 2022-03-02: qty 5

## 2022-03-02 MED ORDER — MIDAZOLAM HCL 5 MG/5ML IJ SOLN
INTRAMUSCULAR | Status: DC | PRN
  Administered 2022-03-02: 2 mg via INTRAVENOUS

## 2022-03-02 MED ORDER — MEPERIDINE HCL 50 MG/ML IJ SOLN: 6.2500 mg | INTRAMUSCULAR | Status: DC | PRN

## 2022-03-02 MED ORDER — CEFAZOLIN SODIUM-DEXTROSE 2-4 GM/100ML-% IV SOLN
INTRAVENOUS | Status: DC
Start: 2022-03-02 — End: 2022-03-02
  Filled 2022-03-02: qty 100

## 2022-03-02 MED ORDER — CEFAZOLIN IN SODIUM CHLORIDE 3-0.9 GM/100ML-% IV SOLN
3.0000 g | INTRAVENOUS | Status: AC
  Administered 2022-03-02: 2 g via INTRAVENOUS
  Administered 2022-03-02: 1 g via INTRAVENOUS

## 2022-03-02 MED ORDER — ROCURONIUM BROMIDE 10 MG/ML (PF) SYRINGE
PREFILLED_SYRINGE | INTRAVENOUS | Status: DC | PRN
  Administered 2022-03-02: 70 mg via INTRAVENOUS

## 2022-03-02 MED ORDER — DIATRIZOATE MEGLUMINE 30 % UR SOLN
URETHRAL | Status: AC
  Filled 2022-03-02: qty 100

## 2022-03-02 MED ORDER — OXYCODONE-ACETAMINOPHEN 5-325 MG PO TABS: 1.0000 | ORAL_TABLET | ORAL | 0 refills | Status: DC | PRN

## 2022-03-02 MED ORDER — MIDAZOLAM HCL 2 MG/2ML IJ SOLN
INTRAMUSCULAR | Status: AC
  Filled 2022-03-02: qty 2

## 2022-03-02 MED ORDER — SUGAMMADEX SODIUM 200 MG/2ML IV SOLN
INTRAVENOUS | Status: DC | PRN
  Administered 2022-03-02: 300 mg via INTRAVENOUS

## 2022-03-02 MED ORDER — SODIUM CHLORIDE 0.9 % IR SOLN
Status: DC | PRN
Start: 2022-03-02 — End: 2022-03-02
  Administered 2022-03-02 (×2): 3000 mL

## 2022-03-02 MED ORDER — PROPOFOL 10 MG/ML IV BOLUS
INTRAVENOUS | Status: AC
Start: 2022-03-02 — End: ?
  Filled 2022-03-02: qty 20

## 2022-03-02 MED ORDER — DEXAMETHASONE SODIUM PHOSPHATE 10 MG/ML IJ SOLN
INTRAMUSCULAR | Status: AC
  Filled 2022-03-02: qty 1

## 2022-03-02 MED ORDER — PROPOFOL 10 MG/ML IV BOLUS
INTRAVENOUS | Status: DC | PRN
  Administered 2022-03-02: 200 mg via INTRAVENOUS

## 2022-03-02 MED ORDER — WATER FOR IRRIGATION, STERILE IR SOLN
Status: DC | PRN
  Administered 2022-03-02: 500 mL

## 2022-03-02 MED ORDER — ONDANSETRON HCL 4 MG/2ML IJ SOLN
4.0000 mg | Freq: Once | INTRAMUSCULAR | Status: AC
  Administered 2022-03-02: 4 mg via INTRAVENOUS

## 2022-03-02 MED ORDER — ONDANSETRON HCL 4 MG PO TABS: 4.0000 mg | ORAL_TABLET | Freq: Three times a day (TID) | ORAL | 1 refills | Status: DC | PRN

## 2022-03-02 MED ORDER — ROCURONIUM BROMIDE 10 MG/ML (PF) SYRINGE
PREFILLED_SYRINGE | INTRAVENOUS | Status: AC
  Filled 2022-03-02: qty 10

## 2022-03-02 MED ORDER — DIATRIZOATE MEGLUMINE 30 % UR SOLN
URETHRAL | Status: DC | PRN
  Administered 2022-03-02: 19 mL via URETHRAL

## 2022-03-02 MED ORDER — ONDANSETRON HCL 4 MG/2ML IJ SOLN: 4.0000 mg | Freq: Once | INTRAMUSCULAR | Status: DC | PRN

## 2022-03-02 SURGICAL SUPPLY — 25 items
BAG DRAIN URO TABLE W/ADPT NS (BAG) ×2 IMPLANT
BAG HAMPER (MISCELLANEOUS) ×2 IMPLANT
CATH INTERMIT  6FR 70CM (CATHETERS) ×2 IMPLANT
CLOTH BEACON ORANGE TIMEOUT ST (SAFETY) ×2 IMPLANT
DECANTER SPIKE VIAL GLASS SM (MISCELLANEOUS) ×2 IMPLANT
EXTRACTOR STONE NITINOL NGAGE (UROLOGICAL SUPPLIES) ×1 IMPLANT
GLOVE SURG POLYISO LF SZ8 (GLOVE) ×2 IMPLANT
GLOVE SURG UNDER POLY LF SZ7 (GLOVE) ×4 IMPLANT
GOWN STRL REUS W/TWL LRG LVL3 (GOWN DISPOSABLE) ×2 IMPLANT
GOWN STRL REUS W/TWL XL LVL3 (GOWN DISPOSABLE) ×2 IMPLANT
GUIDEWIRE STR DUAL SENSOR (WIRE) ×2 IMPLANT
GUIDEWIRE STR ZIPWIRE 035X150 (MISCELLANEOUS) ×2 IMPLANT
IV NS IRRIG 3000ML ARTHROMATIC (IV SOLUTION) ×4 IMPLANT
KIT TURNOVER CYSTO (KITS) ×2 IMPLANT
MANIFOLD NEPTUNE II (INSTRUMENTS) ×2 IMPLANT
PACK CYSTO (CUSTOM PROCEDURE TRAY) ×2 IMPLANT
PAD ARMBOARD 7.5X6 YLW CONV (MISCELLANEOUS) ×2 IMPLANT
SHEATH URETERAL 12FRX35CM (MISCELLANEOUS) ×1 IMPLANT
STENT URET 6FRX26 CONTOUR (STENTS) ×2 IMPLANT
SYR 10ML LL (SYRINGE) ×2 IMPLANT
SYR CONTROL 10ML LL (SYRINGE) ×2 IMPLANT
TOWEL OR 17X26 4PK STRL BLUE (TOWEL DISPOSABLE) ×2 IMPLANT
TRACTIP FLEXIVA PULS ID 200XHI (Laser) IMPLANT
TRACTIP FLEXIVA PULSE ID 200 (Laser) ×2
WATER STERILE IRR 500ML POUR (IV SOLUTION) ×2 IMPLANT

## 2022-03-02 NOTE — Anesthesia Procedure Notes (Signed)
Procedure Name: Intubation ?Date/Time: 03/02/2022 9:10 AM ?Performed by: Myna Bright, CRNA ?Pre-anesthesia Checklist: Patient identified, Emergency Drugs available, Suction available and Patient being monitored ?Patient Re-evaluated:Patient Re-evaluated prior to induction ?Oxygen Delivery Method: Circle system utilized ?Preoxygenation: Pre-oxygenation with 100% oxygen ?Induction Type: IV induction ?Ventilation: Mask ventilation without difficulty ?Laryngoscope Size: Mac and 3 ?Grade View: Grade I ?Tube type: Oral ?Tube size: 7.0 mm ?Number of attempts: 1 ?Airway Equipment and Method: Stylet ?Placement Confirmation: ETT inserted through vocal cords under direct vision, positive ETCO2 and breath sounds checked- equal and bilateral ?Secured at: 21 cm ?Tube secured with: Tape ?Dental Injury: Teeth and Oropharynx as per pre-operative assessment  ? ? ? ? ?

## 2022-03-02 NOTE — Transfer of Care (Signed)
Immediate Anesthesia Transfer of Care Note ? ?Patient: [name] ? ?Procedure(s) Performed: CYSTOSCOPY WITH RETROGRADE PYELOGRAM, RIGHT URETEROSCOPY AND BILATERAL STENT PLACEMENT (Bilateral: Ureter) ?HOLMIUM LASER APPLICATION (Bilateral: Ureter) ?STONE EXTRACTION WITH BASKET (Bilateral: Ureter) ? ?Patient Location: PACU ? ?Anesthesia Type:General ? ?Level of Consciousness: awake, alert , oriented and patient cooperative ? ?Airway & Oxygen Therapy: Patient Spontanous Breathing and Patient connected to face mask oxygen ? ?Post-op Assessment: Report given to RN, Post -op Vital signs reviewed and stable and Patient moving all extremities ? ?Post vital signs: Reviewed and stable ? ?Last Vitals:  ?Vitals Value Taken Time  ?BP 148/77 03/02/22 1023  ?Temp    ?Pulse 86 03/02/22 1025  ?Resp 18 03/02/22 1025  ?SpO2 99 % 03/02/22 1025  ?Vitals shown include unvalidated device data. ? ?Last Pain:  ?Vitals:  ? 03/02/22 0748  ?PainSc: 0-No pain  ?   ? ?  ? ?Complications: No notable events documented. ?

## 2022-03-02 NOTE — Anesthesia Postprocedure Evaluation (Signed)
Anesthesia Post Note ? ?Patient: [name] ? ?Procedure(s) Performed: CYSTOSCOPY WITH RETROGRADE PYELOGRAM, RIGHT URETEROSCOPY AND BILATERAL STENT PLACEMENT (Bilateral: Ureter) ?HOLMIUM LASER APPLICATION (Bilateral: Ureter) ?STONE EXTRACTION WITH BASKET (Right: Ureter) ? ?Patient location during evaluation: Phase II ?Anesthesia Type: General ?Level of consciousness: awake and alert and oriented ?Pain management: pain level controlled ?Vital Signs Assessment: post-procedure vital signs reviewed and stable ?Respiratory status: spontaneous breathing, nonlabored ventilation and respiratory function stable ?Cardiovascular status: blood pressure returned to baseline and stable ?Postop Assessment: no apparent nausea or vomiting ?Anesthetic complications: no ? ? ?No notable events documented. ? ? ?Last Vitals:  ?Vitals:  ? 03/02/22 1115 03/02/22 1130  ?BP: 113/74 (!) 153/90  ?Pulse: 80 90  ?Resp: 19 18  ?Temp:  36.5 ?C  ?SpO2: 92% 95%  ?  ?Last Pain:  ?Vitals:  ? 03/02/22 1130  ?PainSc: 0-No pain  ? ? ?  ?  ?  ?  ?  ?  ? ?Stirling Orton C Desi Carby ? ? ? ? ?

## 2022-03-02 NOTE — Anesthesia Preprocedure Evaluation (Signed)
Anesthesia Evaluation  ?Patient identified by MRN, date of birth, ID band ?Patient awake ? ? ? ?Reviewed: ?Allergy & Precautions, NPO status , Patient's Chart, lab work & pertinent test results ? ?History of Anesthesia Complications ?(+) PONV and history of anesthetic complications ? ?Airway ?Mallampati: II ? ?TM Distance: >3 FB ?Neck ROM: Full ? ? ? Dental ? ?(+) Dental Advisory Given, Missing ?  ?Pulmonary ?asthma , pneumonia, COPD,  COPD inhaler, former smoker,  ?  ?Pulmonary exam normal ?breath sounds clear to auscultation ? ? ? ? ? ? Cardiovascular ?Exercise Tolerance: Good ?hypertension, Pt. on medications ?Normal cardiovascular exam ?Rhythm:Regular Rate:Normal ? ? ?  ?Neuro/Psych ?PSYCHIATRIC DISORDERS Anxiety Depression   ? GI/Hepatic ?GERD  Medicated and Controlled,  ?Endo/Other  ?Morbid obesity ? Renal/GU ?Renal disease  ? ?  ?Musculoskeletal ?negative musculoskeletal ROS ?(+)  ? Abdominal ?  ?Peds ? Hematology ?negative hematology ROS ?(+)   ?Anesthesia Other Findings ? ? Reproductive/Obstetrics ?negative OB ROS ? ?  ? ? ? ? ? ? ? ? ? ? ? ? ? ?  ?  ? ? ? ? ? ? ? ? ?Anesthesia Physical ? ?Anesthesia Plan ? ?ASA: 3 ? ?Anesthesia Plan: General  ? ?Post-op Pain Management: Dilaudid IV  ? ?Induction: Intravenous ? ?PONV Risk Score and Plan: 4 or greater and Ondansetron, Dexamethasone and Midazolam ? ?Airway Management Planned: Oral ETT ? ?Additional Equipment:  ? ?Intra-op Plan:  ? ?Post-operative Plan: Extubation in OR ? ?Informed Consent: I have reviewed the patients History and Physical, chart, labs and discussed the procedure including the risks, benefits and alternatives for the proposed anesthesia with the patient or authorized representative who has indicated his/her understanding and acceptance.  ? ? ? ? ? ?Plan Discussed with: CRNA and Surgeon ? ?Anesthesia Plan Comments:   ? ? ? ? ? ? ?Anesthesia Quick Evaluation ? ?

## 2022-03-02 NOTE — Interval H&P Note (Signed)
History and Physical Interval Note: ? ?03/02/2022 ?8:28 AM ? ?[name]  has presented today for surgery, with the diagnosis of Bilateral Renal Calculi.  The various methods of treatment have been discussed with the patient and family. After consideration of risks, benefits and other options for treatment, the patient has consented to  Procedure(s): ?CYSTOSCOPY WITH RETROGRADE PYELOGRAM, URETEROSCOPY AND STENT PLACEMENT (Bilateral) ?HOLMIUM LASER APPLICATION (Bilateral) as a surgical intervention.  The patient's history has been reviewed, patient examined, no change in status, stable for surgery.  I have reviewed the patient's chart and labs.  Questions were answered to the patient's satisfaction.   ? ? ?[name] ? ? ?

## 2022-03-02 NOTE — Op Note (Signed)
Marland KitchenPreoperative diagnosis: bilateral renal calculi ?  ?Postoperative diagnosis: Same ?  ?Procedure: 1 cystoscopy ?2. Bilateral retrograde pyelography ?3.  Intraoperative fluoroscopy, under one hour, with interpretation ?4.  Left ureteroscopic stone manipulation with laser lithotripsy ?5.  Right diagnostic ureteroscopy ?6.  Bilateral 6 x 26 JJ stent placement ?  ?  ?Attending: Nicolette Bang ?  ?Anesthesia: General ?  ?Estimated blood loss: None ?  ?Drains: bilateral 6 x 26 JJ ureteral stent without tether ?  ?Specimens: stone for analysis ?  ?Antibiotics: ancef ?  ?Findings: left mid pole renal calculi. No hydronephrosis. Right UPJ narrowing which did not allow for advancement of the flexible ureteroscope into the renal pelvis. No masses/lesions in the bladder. Ureteral orifices in normal anatomic location. ?  ?Indications: Patient is a 46 year old female with a history of bilateral renal calculi and bilateral flank pain. After discussing treatment options, they decided proceed with bilateral ureteroscopic stone manipulation. ?  ?Procedure in detail: The patient was brought to the operating room and a brief timeout was done to ensure correct patient, correct procedure, correct site.  General anesthesia was administered patient was placed in dorsal lithotomy position.  Her genitalia was then prepped and draped in usual sterile fashion.  A rigid 74 French cystoscope was passed in the urethra and the bladder.  Bladder was inspected free masses or lesions.  the ureteral orifices were in the normal orthotopic locations. a 6 french ureteral catheter was then instilled into the left ureteral orifice.  a gentle retrograde was obtained and findings noted above. We then advanced a zipwire through the catheter and up to the renal pelvis.  we then removed the cystoscope and cannulated the left ureteral orifice with a semirigid ureteroscope.  We located no stone in the ureter. We then placed a sensor wire up to the renal pelvis.  We removed the scope and advanced a 12/14x35cm access sheath up to the renal pelvis. We then advanced a flexible ureteroscope up to the renal pelvis to perform nephroscopy. We located a calculus in the mid pole which was fragmented with a 242nm laser fiber . The fragments were removed with an NGage basket. Once all the fragments were removed we then removed the access health under direct vision and noted no injury to the ureter.  we then placed a 6 x 26 double-j ureteral stent over the original zip wire.  We then removed the wire and good coil was noted in the the renal pelvis under fluoroscopy and the bladder under direct vision.  We then turned out attention to the right side. a 6 french ureteral catheter was then instilled into the right ureteral orifice.  a gentle retrograde was obtained and findings noted above We then advanced a zipwire through the catheter and up to the renal pelvis.  we then removed the cystoscope and cannulated the right ureteral orifice with a semirigid ureteroscope.  We located no stone in the ureter. We then placed a sensor wire up to the renal pelvis. We then advanced a flexible ureteroscope up to the UPJ but we were unable to advance into the renal pelvis. We elected to place a stent.   we then placed a 6 x 26 double-j ureteral stent over the original zip wire.  We then removed the wire and good coil was noted in the the renal pelvis under fluoroscopy and the bladder under direct vision.   the bladder was then drained and this concluded the procedure which was well tolerated by patient. ?  ?  Complications: None ?  ?Condition: Stable, extubated, transferred to PACU ?  ?Plan: Patient is to be discharged home as to follow-up in 2 weeks.  ?

## 2022-03-04 ENCOUNTER — Encounter (HOSPITAL_COMMUNITY): Payer: Self-pay | Admitting: Urology

## 2022-03-06 ENCOUNTER — Emergency Department (HOSPITAL_COMMUNITY): Payer: 59

## 2022-03-06 ENCOUNTER — Inpatient Hospital Stay (HOSPITAL_COMMUNITY)
Admission: EM | Admit: 2022-03-06 | Discharge: 2022-03-09 | DRG: 690 | Disposition: A | Discharge status: Home / Self Care | Payer: 59 | Attending: Internal Medicine | Admitting: Internal Medicine

## 2022-03-06 ENCOUNTER — Other Ambulatory Visit: Payer: Self-pay

## 2022-03-06 ENCOUNTER — Encounter (HOSPITAL_COMMUNITY): Payer: Self-pay

## 2022-03-06 DIAGNOSIS — J449 Chronic obstructive pulmonary disease, unspecified: Secondary | ICD-10-CM | POA: Diagnosis not present

## 2022-03-06 DIAGNOSIS — N1330 Unspecified hydronephrosis: Secondary | ICD-10-CM | POA: Diagnosis not present

## 2022-03-06 DIAGNOSIS — N200 Calculus of kidney: Secondary | ICD-10-CM

## 2022-03-06 DIAGNOSIS — Z87891 Personal history of nicotine dependence: Secondary | ICD-10-CM | POA: Diagnosis not present

## 2022-03-06 DIAGNOSIS — R112 Nausea with vomiting, unspecified: Secondary | ICD-10-CM | POA: Diagnosis not present

## 2022-03-06 DIAGNOSIS — R109 Unspecified abdominal pain: Principal | ICD-10-CM

## 2022-03-06 DIAGNOSIS — Z9049 Acquired absence of other specified parts of digestive tract: Secondary | ICD-10-CM

## 2022-03-06 DIAGNOSIS — K219 Gastro-esophageal reflux disease without esophagitis: Secondary | ICD-10-CM | POA: Diagnosis present

## 2022-03-06 DIAGNOSIS — F32A Depression, unspecified: Secondary | ICD-10-CM | POA: Diagnosis present

## 2022-03-06 DIAGNOSIS — R69 Illness, unspecified: Secondary | ICD-10-CM | POA: Diagnosis not present

## 2022-03-06 DIAGNOSIS — N3001 Acute cystitis with hematuria: Secondary | ICD-10-CM

## 2022-03-06 DIAGNOSIS — Z6841 Body Mass Index (BMI) 40.0 and over, adult: Secondary | ICD-10-CM

## 2022-03-06 DIAGNOSIS — Z0389 Encounter for observation for other suspected diseases and conditions ruled out: Secondary | ICD-10-CM | POA: Diagnosis not present

## 2022-03-06 DIAGNOSIS — Z9851 Tubal ligation status: Secondary | ICD-10-CM | POA: Diagnosis not present

## 2022-03-06 DIAGNOSIS — Z87442 Personal history of urinary calculi: Secondary | ICD-10-CM | POA: Diagnosis not present

## 2022-03-06 DIAGNOSIS — K5730 Diverticulosis of large intestine without perforation or abscess without bleeding: Secondary | ICD-10-CM | POA: Diagnosis not present

## 2022-03-06 DIAGNOSIS — Z825 Family history of asthma and other chronic lower respiratory diseases: Secondary | ICD-10-CM

## 2022-03-06 DIAGNOSIS — R739 Hyperglycemia, unspecified: Secondary | ICD-10-CM | POA: Diagnosis present

## 2022-03-06 DIAGNOSIS — Z90710 Acquired absence of both cervix and uterus: Secondary | ICD-10-CM

## 2022-03-06 DIAGNOSIS — Z20822 Contact with and (suspected) exposure to covid-19: Secondary | ICD-10-CM | POA: Diagnosis not present

## 2022-03-06 DIAGNOSIS — R509 Fever, unspecified: Secondary | ICD-10-CM | POA: Diagnosis not present

## 2022-03-06 DIAGNOSIS — R9431 Abnormal electrocardiogram [ECG] [EKG]: Secondary | ICD-10-CM | POA: Diagnosis not present

## 2022-03-06 DIAGNOSIS — Z885 Allergy status to narcotic agent status: Secondary | ICD-10-CM | POA: Diagnosis not present

## 2022-03-06 DIAGNOSIS — I10 Essential (primary) hypertension: Secondary | ICD-10-CM | POA: Diagnosis present

## 2022-03-06 DIAGNOSIS — N390 Urinary tract infection, site not specified: Principal | ICD-10-CM | POA: Diagnosis present

## 2022-03-06 DIAGNOSIS — Z79899 Other long term (current) drug therapy: Secondary | ICD-10-CM

## 2022-03-06 DIAGNOSIS — R110 Nausea: Secondary | ICD-10-CM | POA: Diagnosis not present

## 2022-03-06 DIAGNOSIS — D72829 Elevated white blood cell count, unspecified: Secondary | ICD-10-CM | POA: Diagnosis not present

## 2022-03-06 DIAGNOSIS — K449 Diaphragmatic hernia without obstruction or gangrene: Secondary | ICD-10-CM | POA: Diagnosis not present

## 2022-03-06 LAB — COMPREHENSIVE METABOLIC PANEL
ALT: 29 U/L (ref 0–44)
AST: 22 U/L (ref 15–41)
Albumin: 3.5 g/dL (ref 3.5–5.0)
Alkaline Phosphatase: 85 U/L (ref 38–126)
Anion gap: 8 (ref 5–15)
BUN: 14 mg/dL (ref 6–20)
CO2: 22 mmol/L (ref 22–32)
Calcium: 8.7 mg/dL — ABNORMAL LOW (ref 8.9–10.3)
Chloride: 106 mmol/L (ref 98–111)
Creatinine, Ser: 0.92 mg/dL (ref 0.44–1.00)
GFR, Estimated: >60 mL/min (ref >60)
Glucose, Bld: 152 mg/dL — ABNORMAL HIGH (ref 70–99)
Potassium: 3.8 mmol/L (ref 3.5–5.1)
Sodium: 136 mmol/L (ref 135–145)
Total Bilirubin: 0.6 mg/dL (ref 0.3–1.2)
Total Protein: 7.6 g/dL (ref 6.5–8.1)

## 2022-03-06 LAB — CBC WITH DIFFERENTIAL/PLATELET
Abs Immature Granulocytes: 0.08 10*3/uL — ABNORMAL HIGH (ref 0.00–0.07)
Basophils Absolute: 0 10*3/uL (ref 0.0–0.1)
Basophils Relative: 0 %
Eosinophils Absolute: 0.2 10*3/uL (ref 0.0–0.5)
Eosinophils Relative: 1 %
HCT: 43.1 % (ref 36.0–46.0)
Hemoglobin: 14.3 g/dL (ref 12.0–15.0)
Immature Granulocytes: 1 %
Lymphocytes Relative: 19 %
Lymphs Abs: 2.9 10*3/uL (ref 0.7–4.0)
MCH: 30.4 pg (ref 26.0–34.0)
MCHC: 33.2 g/dL (ref 30.0–36.0)
MCV: 91.5 fL (ref 80.0–100.0)
Monocytes Absolute: 0.8 10*3/uL (ref 0.1–1.0)
Monocytes Relative: 6 %
Neutro Abs: 10.9 10*3/uL — ABNORMAL HIGH (ref 1.7–7.7)
Neutrophils Relative %: 73 %
Platelets: 272 10*3/uL (ref 150–400)
RBC: 4.71 MIL/uL (ref 3.87–5.11)
RDW: 13.1 % (ref 11.5–15.5)
WBC: 14.8 10*3/uL — ABNORMAL HIGH (ref 4.0–10.5)
nRBC: 0 % (ref 0.0–0.2)

## 2022-03-06 LAB — CALCULI, WITH PHOTOGRAPH (CLINICAL LAB)
CaHPO4 (Brushite): 70 %
Calcium Oxalate Dihydrate: 30 %
Weight Calculi: 56 mg

## 2022-03-06 LAB — URINALYSIS, ROUTINE W REFLEX MICROSCOPIC
Glucose, UA: NEGATIVE mg/dL
Ketones, ur: NEGATIVE mg/dL
Nitrite: POSITIVE — AB
Protein, ur: >300 mg/dL — AB
Specific Gravity, Urine: 1.025 (ref 1.005–1.030)
pH: 6.5 (ref 5.0–8.0)

## 2022-03-06 LAB — PROTIME-INR
INR: 1 (ref 0.8–1.2)
Prothrombin Time: 13.3 seconds (ref 11.4–15.2)

## 2022-03-06 LAB — LACTIC ACID, PLASMA
Lactic Acid, Venous: 1.2 mmol/L (ref 0.5–1.9)
Lactic Acid, Venous: 0.7 mmol/L (ref 0.5–1.9)

## 2022-03-06 LAB — RESP PANEL BY RT-PCR (FLU A&B, COVID) ARPGX2
Influenza A by PCR: NEGATIVE
Influenza B by PCR: NEGATIVE
SARS Coronavirus 2 by RT PCR: NEGATIVE

## 2022-03-06 LAB — APTT: aPTT: 27 seconds (ref 24–36)

## 2022-03-06 LAB — URINALYSIS, MICROSCOPIC (REFLEX)
Bacteria, UA: NONE SEEN
RBC / HPF: >50 RBC/hpf (ref 0–5)
WBC, UA: NONE SEEN WBC/hpf (ref 0–5)

## 2022-03-06 MED ORDER — HYDROMORPHONE HCL 1 MG/ML IJ SOLN
1.0000 mg | Freq: Once | INTRAMUSCULAR | Status: AC
  Administered 2022-03-06: 1 mg via INTRAVENOUS
  Filled 2022-03-06: qty 1

## 2022-03-06 MED ORDER — SODIUM CHLORIDE 0.9 % IV SOLN
2.0000 g | Freq: Once | INTRAVENOUS | Status: AC
  Administered 2022-03-06: 2 g via INTRAVENOUS
  Filled 2022-03-06: qty 12.5

## 2022-03-06 MED ORDER — SODIUM CHLORIDE 0.9 % IV BOLUS
2000.0000 mL | Freq: Once | INTRAVENOUS | Status: AC
  Administered 2022-03-06: 2000 mL via INTRAVENOUS

## 2022-03-06 MED ORDER — ONDANSETRON HCL 4 MG/2ML IJ SOLN
4.0000 mg | Freq: Once | INTRAMUSCULAR | Status: AC
  Administered 2022-03-06: 4 mg via INTRAVENOUS
  Filled 2022-03-06: qty 2

## 2022-03-06 MED ORDER — SODIUM CHLORIDE 0.9 % IV SOLN
2.0000 g | Freq: Three times a day (TID) | INTRAVENOUS | Status: DC
  Administered 2022-03-07 – 2022-03-09 (×7): 2 g via INTRAVENOUS
  Filled 2022-03-06 (×7): qty 12.5

## 2022-03-06 NOTE — ED Notes (Signed)
Pt returned from CT °

## 2022-03-06 NOTE — H&P (Signed)
?History and Physical  ? ? ?Patient: Avionna Bower Croll CVE:[id number] DOB: 06/20/1976 ?DOA: 03/06/2022 ?DOS: the patient was seen and examined on 03/07/2022 ?PCP: Daryll Drown, NP  ?Patient coming from: Home ? ?Chief Complaint:  ?Chief Complaint  ?Patient presents with  ? Flank Pain  ? ?HPI: Selyna Klahn Steinhauser is a 46 y.o. female with medical history significant for COPD, kidney stones and obesity who presents to the emergency department due to bilateral flank pain which started 4 days ago but rapidly worsened within the last 24 to 48 hours with extension of the pain to bilateral groin and suprapubic area.  She also complained of burning sensation on urination. ?Patient states that she had a cystoscopy with bilateral stent placement due to bilateral renal stones by Dr. Ronne Binning on Monday 4/3, she only complained of mild and tolerable discomfort which she related to the surgical procedure on Tuesdays and Wednesday, but this advanced to pain as described above and was associated with nausea, nonbloody vomiting, chills, diaphoresis which resulted in her presenting to the ED for further evaluation and management.  She denies chest pain, shortness of breath, fever, headache, diarrhea or constipation. ? ?ED Course:  ?In the emergency department, she was hemodynamically stable.  Work-up in the ED showed normal CBC except for leukocytosis and normal BMP except for hyperglycemia, lactic acid 1.2 > 0.7, urinalysis was suggestive of UTI.  Influenza A, B, SARS coronavirus 2 was negative. ?CT abdomen and pelvis without contrast showed bilateral ureteral stents are in place and appear well positioned. There is persistent moderate hydronephrosis bilaterally which has increased on the right compared with previous CT of 9 months ago. In addition, there is periureteral and perinephric softtissue stranding bilaterally. Findings are nonspecific and could reflect stent dysfunction or urinary tract infection. Numerous small caliceal renal  calculi bilaterally. No ureteral or bladder calculi identified. ?Patient was started on IV cefepime, IV Dilaudid was given, Zofran was given and IV hydration was provided.  Hospitalist was asked to admit patient for further evaluation and management. ? ?Review of Systems: ?Review of systems as noted in the HPI. All other systems reviewed and are negative. ? ? ?Past Medical History:  ?Diagnosis Date  ? Asthma   ? COPD (chronic obstructive pulmonary disease) (HCC)   ? sees PCP  ? Depression   ? GERD (gastroesophageal reflux disease)   ? History of kidney stones   ? Hypertension   ? pt. denies  never taken meds  ? Pneumonia   ? PONV (postoperative nausea and vomiting)   ? ?Past Surgical History:  ?Procedure Laterality Date  ? ABDOMINAL HYSTERECTOMY    ? CHOLECYSTECTOMY    ? CYSTOSCOPY W/ RETROGRADES  08/04/2011  ? Procedure: CYSTOSCOPY WITH RETROGRADE PYELOGRAM;  Surgeon: Ky Barban;  Location: [facility];  Service: Urology;  Laterality: Right;  Right Retrograde  ? CYSTOSCOPY WITH RETROGRADE PYELOGRAM, URETEROSCOPY AND STENT PLACEMENT Left 07/24/2021  ? Procedure: CYSTOSCOPY WITH RETROGRADE PYELOGRAM, URETEROSCOPY AND STENT PLACEMENT;  Surgeon: Malen Gauze, MD;  Location: [facility];  Service: Urology;  Laterality: Left;  ? CYSTOSCOPY WITH RETROGRADE PYELOGRAM, URETEROSCOPY AND STENT PLACEMENT Bilateral 03/02/2022  ? Procedure: CYSTOSCOPY WITH RETROGRADE PYELOGRAM, RIGHT URETEROSCOPY AND BILATERAL STENT PLACEMENT;  Surgeon: Malen Gauze, MD;  Location: [facility];  Service: Urology;  Laterality: Bilateral;  ? HOLMIUM LASER APPLICATION N/A 04/15/2018  ? Procedure: HOLMIUM LASER APPLICATION;  Surgeon: Crist Fat, MD;  Location: [facility];  Service: Urology;  Laterality: N/A;  ? HOLMIUM LASER  APPLICATION Left 07/24/2021  ? Procedure: HOLMIUM LASER APPLICATION;  Surgeon: Malen Gauze, MD;  Location: [facility];  Service: Urology;  Laterality: Left;  ? HOLMIUM LASER APPLICATION Bilateral 03/02/2022  ? Procedure:  HOLMIUM LASER APPLICATION;  Surgeon: Malen Gauze, MD;  Location: [facility];  Service: Urology;  Laterality: Bilateral;  ? LITHOTRIPSY    ? NEPHROLITHOTOMY Right 04/15/2018  ? Procedure: RIGHT NEPHROLITHOTOMY PERCUTANEOUS WITH ACCESS;  Surgeon: Crist Fat, MD;  Location: [facility];  Service: Urology;  Laterality: Right;  ? NEPHROLITHOTOMY Right 12/29/2018  ? Procedure: NEPHROLITHOTOMY PERCUTANEOUS;  Surgeon: Crist Fat, MD;  Location: [facility];  Service: Urology;  Laterality: Right;  ? PERCUTANEOUS NEPHROLITHOTRIPSY  2012  ? both sides kidney   ? STONE EXTRACTION WITH BASKET Bilateral 03/02/2022  ? Procedure: STONE EXTRACTION WITH BASKET;  Surgeon: Malen Gauze, MD;  Location: [facility];  Service: Urology;  Laterality: Bilateral;  ? TUBAL LIGATION    ? ? ?Social History:  reports that she quit smoking about 13 months ago. Her smoking use included cigarettes. She has a 9.00 pack-year smoking history. She has never used smokeless tobacco. She reports that she does not drink alcohol and does not use drugs. ? ? ?Allergies  ?Allergen Reactions  ? Hydrocodone Nausea And Vomiting  ? Morphine And Related Hives and Other (See Comments)  ?  Welps  ? Vicodin [Hydrocodone-Acetaminophen] Nausea And Vomiting  ? ? ?Family History  ?Problem Relation Age of Onset  ? COPD Mother   ? COPD Father   ? Heart attack Father   ?  ? ?Prior to Admission medications   ?Medication Sig Start Date End Date Taking? Authorizing Provider  ?albuterol (VENTOLIN HFA) 108 (90 Base) MCG/ACT inhaler Inhale 2 puffs into the lungs every 4 (four) hours as needed for wheezing or shortness of breath. 12/13/19   Elpidio Anis, PA-C  ?cetirizine (ZYRTEC) 10 MG tablet Take 10 mg by mouth daily as needed for allergies. 10/16/21   [provider]  ?cholecalciferol (VITAMIN D3) 25 MCG (1000 UNIT) tablet Take 1,000 Units by mouth daily.    [provider]  ?Cyanocobalamin (B-12 PO) Take 1 tablet by mouth daily.    [provider]  ?Multiple Vitamin (MULTIVITAMIN WITH MINERALS) TABS tablet Take 1 tablet by mouth daily.    [provider]  ?ondansetron (ZOFRAN) 4 MG tablet Take 1 tablet (4 mg total) by mouth every 8 (eight) hours as needed for nausea or vomiting. 03/02/22 03/02/23  Malen Gauze, MD  ?oxyCODONE-acetaminophen (PERCOCET/ROXICET) 5-325 MG tablet Take 1 tablet by mouth every 4 (four) hours as needed for severe pain. 03/02/22   McKenzie, Mardene Celeste, MD  ?Potassium Citrate 15 MEQ (1620 MG) TBCR Take 1 tablet by mouth in the morning and at bedtime. 08/06/21   McKenzie, Mardene Celeste, MD  ? ? ?Physical Exam: ?BP (!) 137/102   Pulse 79   Temp 97.8 ?F (36.6 ?C) (Oral)   Resp 14   Ht 5\' 6"  (1.676 m)   Wt (!) 141.1 kg   LMP 11/05/2015   SpO2 95%   BMI 50.21 kg/m?  ? ?General: 46 y.o. year-old female well developed well nourished in no acute distress.  Alert and oriented x3. ?HEENT: NCAT, EOMI ?Neck: Supple, trachea medial ?Cardiovascular: Regular rate and rhythm with no rubs or gallops.  No thyromegaly or JVD noted.  No lower extremity edema. 2/4 pulses in all 4 extremities. ?Respiratory: Clear to auscultation with no wheezes or rales. Good  inspiratory effort. ?Abdomen: Soft, tender to palpation of suprapubic area, bilateral CVA tenderness.  Nondistended with normal bowel sounds x4 quadrants. ?Muskuloskeletal: No cyanosis, clubbing or edema noted bilaterally ?Neuro: CN II-XII intact, strength 5/5 x 4, sensation, reflexes intact ?Skin: No ulcerative lesions noted or rashes ?Psychiatry: Judgement and insight appear normal. Mood is appropriate for condition and setting ?   ?   ?   ?Labs on Admission:  ?Basic Metabolic Panel: ?Recent Labs  ?Lab 03/06/22 ?1948  ?NA 136  ?K 3.8  ?CL 106  ?CO2 22  ?GLUCOSE 152*  ?BUN 14  ?CREATININE 0.92  ?CALCIUM 8.7*  ? ?Liver Function Tests: ?Recent Labs  ?Lab 03/06/22 ?1950  ?AST 22  ?ALT 29  ?ALKPHOS 85  ?BILITOT 0.6  ?PROT 7.6  ?ALBUMIN 3.5  ? ?No results for input(s): LIPASE,  AMYLASE in the last 168 hours. ?No results for input(s): AMMONIA in the last 168 hours. ?CBC: ?Recent Labs  ?Lab 03/06/22 ?1950  ?WBC 14.8*  ?NEUTROABS 10.9*  ?HGB 14.3  ?HCT 43.1  ?MCV 91.5  ?PLT 272  ? ?Cardiac Enzymes:

## 2022-03-06 NOTE — ED Notes (Signed)
Patient transported to CT 

## 2022-03-06 NOTE — ED Provider Notes (Signed)
?[facility] DEPARTMENT ?Provider Note ? ? ?CSN: [id number] ?Arrival date & time: 03/06/22  1905 ? ?  ? ?History ? ?Chief Complaint  ?Patient presents with  ? Flank Pain  ? ? ?[name] is a 46 y.o. female. ? ?Patient complains of bilateral flank pain.  She also has had fever and chills for couple days.  She had a cystoscope done Monday and states she was fine on Tuesday and Wednesday.  Patient also has a history of hypertension COPD ? ?The history is provided by the patient and medical records. No language interpreter was used.  ?Flank Pain ?This is a recurrent problem. The problem occurs constantly. The problem has not changed since onset.Pertinent negatives include no chest pain, no abdominal pain and no headaches. Nothing aggravates the symptoms. Nothing relieves the symptoms. She has tried nothing for the symptoms. The treatment provided no relief.  ? ?  ? ?Home Medications ?Prior to Admission medications   ?Medication Sig Start Date End Date Taking? Authorizing Provider  ?albuterol (VENTOLIN HFA) 108 (90 Base) MCG/ACT inhaler Inhale 2 puffs into the lungs every 4 (four) hours as needed for wheezing or shortness of breath. 12/13/19   Elpidio Anis, PA-C  ?cetirizine (ZYRTEC) 10 MG tablet Take 10 mg by mouth daily as needed for allergies. 10/16/21   [provider]  ?cholecalciferol (VITAMIN D3) 25 MCG (1000 UNIT) tablet Take 1,000 Units by mouth daily.    [provider]  ?Cyanocobalamin (B-12 PO) Take 1 tablet by mouth daily.    [provider]  ?Multiple Vitamin (MULTIVITAMIN WITH MINERALS) TABS tablet Take 1 tablet by mouth daily.    [provider]  ?ondansetron (ZOFRAN) 4 MG tablet Take 1 tablet (4 mg total) by mouth every 8 (eight) hours as needed for nausea or vomiting. 03/02/22 03/02/23  Malen Gauze, MD  ?oxyCODONE-acetaminophen (PERCOCET/ROXICET) 5-325 MG tablet Take 1 tablet by mouth every 4 (four) hours as needed for severe pain. 03/02/22    McKenzie, Mardene Celeste, MD  ?Potassium Citrate 15 MEQ (1620 MG) TBCR Take 1 tablet by mouth in the morning and at bedtime. 08/06/21   McKenzie, Mardene Celeste, MD  ?   ? ?Allergies    ?Hydrocodone, Morphine and related, and Vicodin [hydrocodone-acetaminophen]   ? ?Review of Systems   ?Review of Systems  ?Constitutional:  Positive for chills and fever. Negative for appetite change and fatigue.  ?HENT:  Negative for congestion, ear discharge and sinus pressure.   ?Eyes:  Negative for discharge.  ?Respiratory:  Negative for cough.   ?Cardiovascular:  Negative for chest pain.  ?Gastrointestinal:  Negative for abdominal pain and diarrhea.  ?Genitourinary:  Positive for flank pain. Negative for frequency and hematuria.  ?Musculoskeletal:  Negative for back pain.  ?Skin:  Negative for rash.  ?Neurological:  Negative for seizures and headaches.  ?Psychiatric/Behavioral:  Negative for hallucinations.   ? ?Physical Exam ?Updated Vital Signs ?BP (!) 109/57   Pulse 91   Temp 97.8 ?F (36.6 ?C) (Oral)   Resp 14   Ht 5\' 6"  (1.676 m)   Wt (!) 141.1 kg   LMP 11/05/2015   SpO2 96%   BMI 50.21 kg/m?  ?Physical Exam ?Vitals and nursing note reviewed.  ?Constitutional:   ?   Appearance: She is well-developed.  ?HENT:  ?   Head: Normocephalic.  ?   Nose: Nose normal.  ?Eyes:  ?   General: No scleral icterus. ?   Conjunctiva/sclera: Conjunctivae normal.  ?Neck:  ?  Thyroid: No thyromegaly.  ?Cardiovascular:  ?   Rate and Rhythm: Normal rate and regular rhythm.  ?   Heart sounds: No murmur heard. ?  No friction rub. No gallop.  ?Pulmonary:  ?   Breath sounds: No stridor. No wheezing or rales.  ?Chest:  ?   Chest wall: No tenderness.  ?Abdominal:  ?   General: There is no distension.  ?   Tenderness: There is no abdominal tenderness. There is no rebound.  ?Genitourinary: ?   Comments: Bilateral flank tenderness ?Musculoskeletal:     ?   General: Normal range of motion.  ?   Cervical back: Neck supple.  ?Lymphadenopathy:  ?   Cervical: No  cervical adenopathy.  ?Skin: ?   Findings: No erythema or rash.  ?Neurological:  ?   Mental Status: She is alert and oriented to person, place, and time.  ?   Motor: No abnormal muscle tone.  ?   Coordination: Coordination normal.  ?Psychiatric:     ?   Behavior: Behavior normal.  ? ? ?ED Results / Procedures / Treatments   ?Labs ?(all labs ordered are listed, but only abnormal results are displayed) ?Labs Reviewed  ?URINALYSIS, ROUTINE W REFLEX MICROSCOPIC - Abnormal; Notable for the following components:  ?    Result Value  ? Color, Urine ORANGE (*)   ? Hgb urine dipstick LARGE (*)   ? Bilirubin Urine SMALL (*)   ? Protein, ur >300 (*)   ? Nitrite POSITIVE (*)   ? Leukocytes,Ua MODERATE (*)   ? All other components within normal limits  ?COMPREHENSIVE METABOLIC PANEL - Abnormal; Notable for the following components:  ? Glucose, Bld 152 (*)   ? Calcium 8.7 (*)   ? All other components within normal limits  ?CBC WITH DIFFERENTIAL/PLATELET - Abnormal; Notable for the following components:  ? WBC 14.8 (*)   ? Neutro Abs 10.9 (*)   ? Abs Immature Granulocytes 0.08 (*)   ? All other components within normal limits  ?RESP PANEL BY RT-PCR (FLU A&B, COVID) [id number]  ?CULTURE, BLOOD (ROUTINE X 2)  ?CULTURE, BLOOD (ROUTINE X 2)  ?URINE CULTURE  ?LACTIC ACID, PLASMA  ?LACTIC ACID, PLASMA  ?PROTIME-INR  ?APTT  ?URINALYSIS, MICROSCOPIC (REFLEX)  ? ? ?EKG ?None ? ?Radiology ?DG Chest Port 1 View ? ?Result Date: 03/06/2022 ?CLINICAL DATA:  Possible sepsis EXAM: PORTABLE CHEST 1 VIEW COMPARISON:  09/18/2021 FINDINGS: The heart size and mediastinal contours are within normal limits. Both lungs are clear. The visualized skeletal structures are unremarkable. IMPRESSION: No active disease. Electronically Signed   By: Alcide Clever M.D.   On: 03/06/2022 21:05  ? ?CT Renal Stone Study ? ?Result Date: 03/06/2022 ?CLINICAL DATA:  Flank pain, kidney stone suspected. Bilateral flank pain and nausea. EXAM: CT ABDOMEN AND PELVIS WITHOUT CONTRAST  TECHNIQUE: Multidetector CT imaging of the abdomen and pelvis was performed following the standard protocol without IV contrast. RADIATION DOSE REDUCTION: This exam was performed according to the departmental dose-optimization program which includes automated exposure control, adjustment of the mA and/or kV according to patient size and/or use of iterative reconstruction technique. COMPARISON:  Abdominopelvic CT 05/31/2021. Renal ultrasound 11/11/2021. FINDINGS: Lower chest: 3 mm subpleural nodule in the left lower lobe on image 17/4. This area was not clearly imaged previously, and this is not definitely new. Regardless, no follow-up necessary per consensus guidelines. The visualized lung bases are otherwise clear. There is no significant pleural or pericardial effusion. A moderate size hiatal hernia is  present. Hepatobiliary: Moderate heterogeneous hepatic steatosis. There is mild contour irregularity of the liver suspicious for early cirrhosis. No evidence of significant biliary dilatation status post cholecystectomy. Pancreas: Unremarkable. No pancreatic ductal dilatation or surrounding inflammatory changes. Spleen: Normal in size without focal abnormality. Adrenals/Urinary Tract: Both adrenal glands appear normal. Interval placement of bilateral double-J ureteral stents which appear well positioned. No definite stone fragments are seen along the course of the stents. Multiple small caliceal calculi are present in both kidneys, right greater than left. There is persistent bilateral hydronephrosis with associated perinephric and periureteral soft tissue stranding. Compared with the prior CT, this has increased on the right. The bladder is decompressed without focal abnormality. Stomach/Bowel: No enteric contrast administered. The stomach appears unremarkable for its degree of distension. No evidence of bowel wall thickening, distention or surrounding inflammatory change. The appendix appears normal. Mild distal  colonic diverticulosis. Vascular/Lymphatic: There are no enlarged abdominal or pelvic lymph nodes. No significant vascular findings on noncontrast imaging. Reproductive: Status post hysterectomy. No evidence of adnexal mas

## 2022-03-06 NOTE — Sepsis Progress Note (Signed)
Following for sepsis monitoring ?

## 2022-03-06 NOTE — ED Notes (Signed)
ED Provider at bedside. 

## 2022-03-06 NOTE — Progress Notes (Signed)
Pharmacy Antibiotic Note ? ?[name] is a 46 y.o. female admitted on 03/06/2022 with sepsis and UTI.  Pharmacy has been consulted for Cefepime dosing. ? ?Plan: ?Start Cefepime 2 gm IV q8hr ?Monitor renal function and C&S ? ?Height: 5\' 6"  (167.6 cm) ?Weight: (!) 141.1 kg (311 lb 1.1 oz) ?IBW/kg (Calculated) : 59.3 ? ?Temp (24hrs), Avg:98 ?F (36.7 ?C), Min:98 ?F (36.7 ?C), Max:98 ?F (36.7 ?C) ? ?No results for input(s): WBC, CREATININE, LATICACIDVEN, VANCOTROUGH, VANCOPEAK, VANCORANDOM, GENTTROUGH, GENTPEAK, GENTRANDOM, TOBRATROUGH, TOBRAPEAK, TOBRARND, AMIKACINPEAK, AMIKACINTROU, AMIKACIN in the last 168 hours.  ?CrCl cannot be calculated (Patient's most recent lab result is older than the maximum 21 days allowed.).   ? ?Allergies  ?Allergen Reactions  ? Hydrocodone Nausea And Vomiting  ? Morphine And Related Hives and Other (See Comments)  ?  Welps  ? Vicodin [Hydrocodone-Acetaminophen] Nausea And Vomiting  ? ? ?Thank you for allowing pharmacy to be a part of this patient?s care. ? ? , PharmD, FCCM ?Clinical Pharmacist ?Please see AMION for all Pharmacists' Contact Phone Numbers ?03/06/2022, 8:08 PM  ? ? ?

## 2022-03-06 NOTE — ED Triage Notes (Addendum)
Pt arrived via POV c/o bilateral flank pain and now nauseous from the pain. Pt reports 5mg  Percocet's ineffective for pain control at home. Pt endorses hematuria at this time. Pt reports that it is too painful to ambulate in Triage at this time.  ?

## 2022-03-07 DIAGNOSIS — R9431 Abnormal electrocardiogram [ECG] [EKG]: Secondary | ICD-10-CM

## 2022-03-07 DIAGNOSIS — D72829 Elevated white blood cell count, unspecified: Secondary | ICD-10-CM

## 2022-03-07 DIAGNOSIS — N1330 Unspecified hydronephrosis: Secondary | ICD-10-CM

## 2022-03-07 DIAGNOSIS — N3001 Acute cystitis with hematuria: Secondary | ICD-10-CM | POA: Diagnosis not present

## 2022-03-07 DIAGNOSIS — R109 Unspecified abdominal pain: Secondary | ICD-10-CM

## 2022-03-07 DIAGNOSIS — R112 Nausea with vomiting, unspecified: Secondary | ICD-10-CM

## 2022-03-07 DIAGNOSIS — N200 Calculus of kidney: Secondary | ICD-10-CM | POA: Diagnosis not present

## 2022-03-07 DIAGNOSIS — R110 Nausea: Secondary | ICD-10-CM

## 2022-03-07 DIAGNOSIS — R509 Fever, unspecified: Secondary | ICD-10-CM

## 2022-03-07 LAB — COMPREHENSIVE METABOLIC PANEL
ALT: 27 U/L (ref 0–44)
AST: 18 U/L (ref 15–41)
Albumin: 3.3 g/dL — ABNORMAL LOW (ref 3.5–5.0)
Alkaline Phosphatase: 72 U/L (ref 38–126)
Anion gap: 8 (ref 5–15)
BUN: 14 mg/dL (ref 6–20)
CO2: 21 mmol/L — ABNORMAL LOW (ref 22–32)
Calcium: 8.4 mg/dL — ABNORMAL LOW (ref 8.9–10.3)
Chloride: 108 mmol/L (ref 98–111)
Creatinine, Ser: 0.77 mg/dL (ref 0.44–1.00)
GFR, Estimated: >60 mL/min (ref >60)
Glucose, Bld: 121 mg/dL — ABNORMAL HIGH (ref 70–99)
Potassium: 4.3 mmol/L (ref 3.5–5.1)
Sodium: 137 mmol/L (ref 135–145)
Total Bilirubin: 0.8 mg/dL (ref 0.3–1.2)
Total Protein: 7.2 g/dL (ref 6.5–8.1)

## 2022-03-07 LAB — CBC
HCT: 40.2 % (ref 36.0–46.0)
Hemoglobin: 13.3 g/dL (ref 12.0–15.0)
MCH: 31.4 pg (ref 26.0–34.0)
MCHC: 33.1 g/dL (ref 30.0–36.0)
MCV: 94.8 fL (ref 80.0–100.0)
Platelets: 150 10*3/uL (ref 150–400)
RBC: 4.24 MIL/uL (ref 3.87–5.11)
RDW: 13.4 % (ref 11.5–15.5)
WBC: 11.5 10*3/uL — ABNORMAL HIGH (ref 4.0–10.5)
nRBC: 0 % (ref 0.0–0.2)

## 2022-03-07 LAB — MAGNESIUM: Magnesium: 1.8 mg/dL (ref 1.7–2.4)

## 2022-03-07 LAB — PHOSPHORUS: Phosphorus: 3.2 mg/dL (ref 2.5–4.6)

## 2022-03-07 LAB — HIV ANTIBODY (ROUTINE TESTING W REFLEX): HIV Screen 4th Generation wRfx: NONREACTIVE

## 2022-03-07 MED ORDER — SODIUM CHLORIDE 0.9 % IV SOLN: INTRAVENOUS | Status: AC

## 2022-03-07 MED ORDER — ACETAMINOPHEN 325 MG PO TABS: 650.0000 mg | ORAL_TABLET | Freq: Four times a day (QID) | ORAL | Status: DC | PRN

## 2022-03-07 MED ORDER — PROCHLORPERAZINE MALEATE 5 MG PO TABS
10.0000 mg | ORAL_TABLET | Freq: Four times a day (QID) | ORAL | Status: DC | PRN
  Administered 2022-03-07: 10 mg via ORAL
  Filled 2022-03-07 (×3): qty 1

## 2022-03-07 MED ORDER — HYDROMORPHONE HCL 1 MG/ML IJ SOLN
0.5000 mg | INTRAMUSCULAR | Status: DC | PRN
  Administered 2022-03-07 – 2022-03-09 (×8): 0.5 mg via INTRAVENOUS
  Filled 2022-03-07 (×3): qty 0.5
  Filled 2022-03-07: qty 1
  Filled 2022-03-07 (×2): qty 0.5
  Filled 2022-03-07: qty 1
  Filled 2022-03-07 (×2): qty 0.5

## 2022-03-07 MED ORDER — KETOROLAC TROMETHAMINE 30 MG/ML IJ SOLN
30.0000 mg | Freq: Three times a day (TID) | INTRAMUSCULAR | Status: DC
  Administered 2022-03-07 – 2022-03-09 (×6): 30 mg via INTRAVENOUS
  Filled 2022-03-07 (×6): qty 1

## 2022-03-07 MED ORDER — ACETAMINOPHEN 650 MG RE SUPP: 650.0000 mg | Freq: Four times a day (QID) | RECTAL | Status: DC | PRN

## 2022-03-07 MED ORDER — POLYETHYLENE GLYCOL 3350 17 G PO PACK
17.0000 g | PACK | Freq: Every day | ORAL | Status: DC
  Administered 2022-03-07: 17 g via ORAL
  Filled 2022-03-07: qty 1

## 2022-03-07 MED ORDER — ENOXAPARIN SODIUM 40 MG/0.4ML IJ SOSY: 40.0000 mg | PREFILLED_SYRINGE | INTRAMUSCULAR | Status: DC

## 2022-03-07 MED ORDER — CALCIUM CARBONATE ANTACID 500 MG PO CHEW
1.0000 | CHEWABLE_TABLET | Freq: Once | ORAL | Status: AC
  Administered 2022-03-07: 200 mg via ORAL
  Filled 2022-03-07: qty 1

## 2022-03-07 NOTE — Plan of Care (Signed)

## 2022-03-07 NOTE — TOC Initial Note (Signed)
?  Transition of Care (TOC) Screening Note ? ? ?Patient Details  ?Name: [name] ?Date of Birth: 04-25-1976 ? ? ?Transition of Care (TOC) CM/SW Contact:    ?Hetty Ely, RN ?Phone Number: ?03/07/2022, 10:59 AM ? ? ? ?Transition of Care Department [facility]) has reviewed patient and no TOC needs have been identified at this time. We will continue to monitor patient advancement through interdisciplinary progression rounds. If new patient transition needs arise, please place a TOC consult. ? ?             ? ? ?  ?  ? ? ?Patient Goals and CMS Choice ?  ?  ?  ? ?Expected Discharge Plan and Services ?  ?  ?  ?  ?  ?                ?  ?  ?  ?  ?  ?  ?  ?  ?  ?  ? ?Prior Living Arrangements/Services ?  ?  ?  ?       ?  ?  ?  ?  ? ?Activities of Daily Living ?Home Assistive Devices/Equipment: None ?ADL Screening (condition at time of admission) ?Patient's cognitive ability adequate to safely complete daily activities?: Yes ?Is the patient deaf or have difficulty hearing?: No ?Does the patient have difficulty seeing, even when wearing glasses/contacts?: No ?Does the patient have difficulty concentrating, remembering, or making decisions?: No ?Patient able to express need for assistance with ADLs?: Yes ?Does the patient have difficulty dressing or bathing?: No ?Independently performs ADLs?: Yes (appropriate for developmental age) ?Does the patient have difficulty walking or climbing stairs?: No ?Weakness of Legs: None ?Weakness of Arms/Hands: None ? ?Permission Sought/Granted ?  ?  ?   ?   ?   ?   ? ?Emotional Assessment ?  ?  ?  ?  ?  ?  ? ?Admission diagnosis:  UTI (urinary tract infection) [N39.0] ?Flank pain [R10.9] ?Patient Active Problem List  ? Diagnosis Date Noted  ? Nausea & vomiting 03/07/2022  ? Prolonged QT interval 03/07/2022  ? UTI (urinary tract infection) 03/06/2022  ? Encounter for medical examination to establish care 02/06/2022  ? Other fatigue 02/06/2022  ? Morbid obesity (HCC) 02/06/2022  ? Depression,  major, single episode, moderate (HCC) 02/06/2022  ? Nephrolithiasis 12/29/2018  ? Stone, kidney 04/15/2018  ? Asthma, moderate persistent 11/12/2015  ? Leukocytosis 09/25/2011  ? Anxiety 09/25/2011  ? Acute exacerbation of COPD with asthma (HCC) 09/24/2011  ? Acute bronchitis 09/23/2011  ? Aspiration of gastric contents 08/04/2011  ? COPD (chronic obstructive pulmonary disease) (HCC) 08/04/2011  ? Kidney stones 08/04/2011  ? ?PCP:  Daryll Drown, NP ?Pharmacy:   ?[facility] - [place], [place] - [id number] ?[address] ?[place] [address] ?Phone: [phone] Fax: [phone] ? ? ? ? ?Social Determinants of Health (SDOH) Interventions ?  ? ?Readmission Risk Interventions ?   ? View : No data to display.  ?  ?  ?  ? ? ? ?

## 2022-03-07 NOTE — Consult Note (Signed)
Urology Consult  ? ?History of Present Illness: [name] is a 46 y.o. with a possible urinary tract infection s/p GU procedure earlier this week. ? ?The patient underwent bilateral ureteroscopy with laser litho + stent placement on Monday 4/3 with Dr Alyson Ingles. Beginning Thursday she had increasing discomfort with subjective fever symptoms at home. She also endorses nausea. She presented to the ED last night. Her UA is suspicious for infection and her white count was elevated. A CT scan was obtained and does show some mild bilateral hydro but the stents appear to be in proper position.  ? ?[name] has been afebrile in the ED and her WBCs have improved overnight. At the time of my exam, she continues to endorse flank discomfort (L>R) and subjective fever symptoms.  ? ?Past Medical History:  ?Diagnosis Date  ? Asthma   ? COPD (chronic obstructive pulmonary disease) ([place])   ? sees PCP  ? Depression   ? GERD (gastroesophageal reflux disease)   ? History of kidney stones   ? Hypertension   ? pt. denies  never taken meds  ? Pneumonia   ? PONV (postoperative nausea and vomiting)   ? ? ?Past Surgical History:  ?Procedure Laterality Date  ? ABDOMINAL HYSTERECTOMY    ? CHOLECYSTECTOMY    ? CYSTOSCOPY W/ RETROGRADES  08/04/2011  ? Procedure: CYSTOSCOPY WITH RETROGRADE PYELOGRAM;  Surgeon: Marissa Nestle;  Location: [facility];  Service: Urology;  Laterality: Right;  Right Retrograde  ? CYSTOSCOPY WITH RETROGRADE PYELOGRAM, URETEROSCOPY AND STENT PLACEMENT Left 07/24/2021  ? Procedure: CYSTOSCOPY WITH RETROGRADE PYELOGRAM, URETEROSCOPY AND STENT PLACEMENT;  Surgeon: Cleon Gustin, MD;  Location: [facility];  Service: Urology;  Laterality: Left;  ? CYSTOSCOPY WITH RETROGRADE PYELOGRAM, URETEROSCOPY AND STENT PLACEMENT Bilateral 03/02/2022  ? Procedure: CYSTOSCOPY WITH RETROGRADE PYELOGRAM, RIGHT URETEROSCOPY AND BILATERAL STENT PLACEMENT;  Surgeon: Cleon Gustin, MD;  Location: [facility];  Service: Urology;   Laterality: Bilateral;  ? HOLMIUM LASER APPLICATION N/A [id number]  ? Procedure: HOLMIUM LASER APPLICATION;  Surgeon: Ardis Hughs, MD;  Location: [facility];  Service: Urology;  Laterality: N/A;  ? HOLMIUM LASER APPLICATION Left 0000000  ? Procedure: HOLMIUM LASER APPLICATION;  Surgeon: Cleon Gustin, MD;  Location: [facility];  Service: Urology;  Laterality: Left;  ? HOLMIUM LASER APPLICATION Bilateral 0000000  ? Procedure: HOLMIUM LASER APPLICATION;  Surgeon: Cleon Gustin, MD;  Location: [facility];  Service: Urology;  Laterality: Bilateral;  ? LITHOTRIPSY    ? NEPHROLITHOTOMY Right 04/15/2018  ? Procedure: RIGHT NEPHROLITHOTOMY PERCUTANEOUS WITH ACCESS;  Surgeon: Ardis Hughs, MD;  Location: [facility];  Service: Urology;  Laterality: Right;  ? NEPHROLITHOTOMY Right 12/29/2018  ? Procedure: NEPHROLITHOTOMY PERCUTANEOUS;  Surgeon: Ardis Hughs, MD;  Location: [facility];  Service: Urology;  Laterality: Right;  ? PERCUTANEOUS NEPHROLITHOTRIPSY  2012  ? both sides kidney   ? STONE EXTRACTION WITH BASKET Bilateral 03/02/2022  ? Procedure: STONE EXTRACTION WITH BASKET;  Surgeon: Cleon Gustin, MD;  Location: [facility];  Service: Urology;  Laterality: Bilateral;  ? TUBAL LIGATION    ? ? ? ?Current Hospital Medications: ? ?Home meds:  ?No current facility-administered medications on file prior to encounter.  ? ?Current Outpatient Medications on File Prior to Encounter  ?Medication Sig Dispense Refill  ? albuterol (VENTOLIN HFA) 108 (90 Base) MCG/ACT inhaler Inhale 2 puffs into the lungs every 4 (four) hours as needed for wheezing or shortness of breath. 8 g 0  ? cetirizine (  ZYRTEC) 10 MG tablet Take 10 mg by mouth daily as needed for allergies.    ? cholecalciferol (VITAMIN D3) 25 MCG (1000 UNIT) tablet Take 1,000 Units by mouth daily.    ? Cyanocobalamin (B-12 PO) Take 1 tablet by mouth daily.    ? Multiple Vitamin (MULTIVITAMIN WITH MINERALS) TABS tablet Take 1 tablet by mouth daily.    ? ondansetron  (ZOFRAN) 4 MG tablet Take 1 tablet (4 mg total) by mouth every 8 (eight) hours as needed for nausea or vomiting. 30 tablet 1  ? oxyCODONE-acetaminophen (PERCOCET/ROXICET) 5-325 MG tablet Take 1 tablet by mouth every 4 (four) hours as needed for severe pain. 30 tablet 0  ? Potassium Citrate 15 MEQ (1620 MG) TBCR Take 1 tablet by mouth in the morning and at bedtime. 60 tablet 11  ?  ? ?Scheduled Meds: ?Continuous Infusions: ? sodium chloride 100 mL/hr at 03/07/22 0448  ? ceFEPime (MAXIPIME) IV Stopped (03/07/22 0414)  ? ?PRN Meds:.acetaminophen **OR** acetaminophen, HYDROmorphone (DILAUDID) injection, prochlorperazine ? ?Allergies:  ?Allergies  ?Allergen Reactions  ? Hydrocodone Nausea And Vomiting  ? Morphine And Related Hives and Other (See Comments)  ?  [place]ps  ? Vicodin [Hydrocodone-Acetaminophen] Nausea And Vomiting  ? ? ?Family History  ?Problem Relation Age of Onset  ? COPD Mother   ? COPD Father   ? Heart attack Father   ? ? ?Social History:  reports that she quit smoking about 13 months ago. Her smoking use included cigarettes. She has a 9.00 pack-year smoking history. She has never used smokeless tobacco. She reports that she does not drink alcohol and does not use drugs. ? ?ROS: ?A complete review of systems was performed.  All systems are negative except for pertinent findings as noted. ? ?Physical Exam:  ?Vital signs in last 24 hours: ?Temp:  [97.8 ?F (36.6 ?C)-98 ?F (36.7 ?C)] 97.8 ?F (36.6 ?C) (04/07 2203) ?Pulse Rate:  [72-115] 75 (04/08 0450) ?Resp:  [14-20] 18 (04/08 0450) ?BP: (109-156)/(54-102) 123/54 (04/08 0450) ?SpO2:  [93 %-99 %] 99 % (04/08 0450) ?Weight:  [141.1 kg] 141.1 kg (04/07 1916) ?Constitutional:  Alert and oriented, No acute distress ?Cardiovascular: Regular rate and rhythm ?Respiratory: Normal respiratory effort, Lungs clear bilaterally ?GI: Abdomen is soft, nontender, nondistended, no abdominal masses ?GU: No CVA tenderness ?Neurologic: Grossly intact, no focal  deficits ?Psychiatric: Normal mood and affect ? ?Laboratory Data:  ?Recent Labs  ?  03/06/22 ?1948 03/07/22 ?0604  ?WBC 14.8* 11.5*  ?HGB 14.3 13.3  ?HCT 43.1 40.2  ?PLT 272 150  ? ? ?Recent Labs  ?  03/06/22 ?1948 03/07/22 ?0604  ?NA 136 137  ?K 3.8 4.3  ?CL 106 108  ?GLUCOSE 152* 121*  ?BUN 14 14  ?CALCIUM 8.7* 8.4*  ?CREATININE 0.92 0.77  ? ? ? ?Results for orders placed or performed during the hospital encounter of 03/06/22 (from the past 24 hour(s))  ?Urinalysis, Routine w reflex microscopic     Status: Abnormal  ? Collection Time: 03/06/22  7:25 PM  ?Result Value Ref Range  ? Color, Urine ORANGE (A) YELLOW  ? APPearance CLEAR CLEAR  ? Specific Gravity, Urine 1.025 1.005 - 1.030  ? pH 6.5 5.0 - 8.0  ? Glucose, UA NEGATIVE NEGATIVE mg/dL  ? Hgb urine dipstick LARGE (A) NEGATIVE  ? Bilirubin Urine SMALL (A) NEGATIVE  ? Ketones, ur NEGATIVE NEGATIVE mg/dL  ? Protein, ur >300 (A) NEGATIVE mg/dL  ? Nitrite POSITIVE (A) NEGATIVE  ? Leukocytes,Ua MODERATE (A) NEGATIVE  ?Urinalysis,  Microscopic (reflex)     Status: None  ? Collection Time: 03/06/22  7:25 PM  ?Result Value Ref Range  ? RBC / HPF >50 0 - 5 RBC/hpf  ? WBC, UA NONE SEEN 0 - 5 WBC/hpf  ? Bacteria, UA NONE SEEN NONE SEEN  ? Squamous Epithelial / LPF 0-5 0 - 5  ?Resp Panel by RT-PCR (Flu A&B, Covid) Nasopharyngeal Swab     Status: None  ? Collection Time: 03/06/22  7:27 PM  ? Specimen: Nasopharyngeal Swab; Nasopharyngeal(NP) swabs in vial transport medium  ?Result Value Ref Range  ? SARS Coronavirus 2 by RT PCR NEGATIVE NEGATIVE  ? Influenza A by PCR NEGATIVE NEGATIVE  ? Influenza B by PCR NEGATIVE NEGATIVE  ?Lactic acid, plasma     Status: None  ? Collection Time: 03/06/22  7:48 PM  ?Result Value Ref Range  ? Lactic Acid, Venous 1.2 0.5 - 1.9 mmol/L  ?Comprehensive metabolic panel     Status: Abnormal  ? Collection Time: 03/06/22  7:48 PM  ?Result Value Ref Range  ? Sodium 136 135 - 145 mmol/L  ? Potassium 3.8 3.5 - 5.1 mmol/L  ? Chloride 106 98 - 111 mmol/L   ? CO2 22 22 - 32 mmol/L  ? Glucose, Bld 152 (H) 70 - 99 mg/dL  ? BUN 14 6 - 20 mg/dL  ? Creatinine, Ser 0.92 0.44 - 1.00 mg/dL  ? Calcium 8.7 (L) 8.9 - 10.3 mg/dL  ? Total Protein 7.6 6.5 - 8.1 g/dL  ? Albumin 3.5 3.5 - 5.0 g/dL

## 2022-03-07 NOTE — ED Notes (Signed)
Pt ambulated to restroom without assistance. Pt reports feeling nauseated at this time. RN updated Admission Hospitalist on Pt status.  ?

## 2022-03-07 NOTE — Progress Notes (Signed)
?PROGRESS NOTE ? ? ? ?[name]  [id number] DOB: 1976-07-27 DOA: 03/06/2022 ?PCP: Ivy Lynn, NP ? ? ?Brief Narrative:  ?[name] is a 46 y.o. female with medical history significant for COPD, kidney stones and obesity who presents to the emergency department due to bilateral flank pain which started 4 days prior to admission worsening more acutely over the past 24 hours prior to admission.  Recent bilateral renal stent placement by urology in the outpatient setting on Monday, 03/02/2022.  Given symptoms and initial lab work patient mated for UTI after recent instrumentation and stent placement with previous nephrolithiasis and obstruction. ? ? ?Assessment & Plan: ?  ?Principal Problem: ?  UTI (urinary tract infection) ?Active Problems: ?  COPD (chronic obstructive pulmonary disease) ([place]) ?  Stone, kidney ?  Morbid obesity ([place]) ?  Nausea & vomiting ?  Prolonged QT interval ? ?Sepsis secondary to UTI POA ?Bilateral nephrolithiasis with stent placement ?Leukocytosis, tachycardia and UTI at intake ?Continue IV cefepime ?Pain well controlled on current regimen ?Urology following ? ?Nausea and vomiting ?Continue IV Compazine as needed ? ?Prolonged QTc (497 ms) ?Avoid QT prolonging drugs ? ?COPD (not in acute exacerbation) ?Continue albuterol per home regimen ? ?Class III obesity (BMI 50.21 kg/m?) ?Diet and lifestyle modification ?  ?DVT prophylaxis: SCDs ?Advance Care Planning: CODE STATUS: Full code ?Consults: Urology ?Family Communication: None at bedside ? ?Status is: Inpatient ? ?Dispo: The patient is from: Home ?             Anticipated d/c is to: Home ?             Anticipated d/c date is: 24-48h ?             Patient currently not medically stable for discharge ? ?Consultants:  ?Urology ? ?Procedures:  ?Pending above ? ?Antimicrobials:  ?Cefepime  ? ?Subjective: ?No acute issues/events overnight ? ?Objective: ?Vitals:  ? 03/07/22 0000 03/07/22 0030 03/07/22 0100 03/07/22 0450  ?BP: 115/61  123/76 (!) 137/102 (!) 123/54  ?Pulse: 72 72 79 75  ?Resp: 17 16 14 18   ?Temp:      ?TempSrc:      ?SpO2: 95% 98% 95% 99%  ?Weight:      ?Height:      ? ? ?Intake/Output Summary (Last 24 hours) at 03/07/2022 0746 ?Last data filed at 03/07/2022 0448 ?Gross per 24 hour  ?Intake 2201.46 ml  ?Output --  ?Net 2201.46 ml  ? ?Filed Weights  ? 03/06/22 1916  ?Weight: (!) 141.1 kg  ? ? ?Examination: ? ?General:  Pleasantly resting in bed, No acute distress. ?HEENT:  Normocephalic atraumatic.  Sclerae nonicteric, noninjected.  Extraocular movements intact bilaterally. ?Neck:  Without mass or deformity.  Trachea is midline. ?Lungs:  Clear to auscultate bilaterally without rhonchi, wheeze, or rales. ?Heart:  Regular rate and rhythm.  Without murmurs, rubs, or gallops. ?Abdomen:  Soft, nontender, nondistended.  Without guarding or rebound. ?Extremities: Without cyanosis, clubbing, edema, or obvious deformity. ?Vascular:  Dorsalis pedis and posterior tibial pulses palpable bilaterally. ?Skin:  Warm and dry, no erythema, no ulcerations. ? ?CBC: ?Recent Labs  ?Lab 03/06/22 ?1948 03/07/22 ?0604  ?WBC 14.8* 11.5*  ?NEUTROABS 10.9*  --   ?HGB 14.3 13.3  ?HCT 43.1 40.2  ?MCV 91.5 94.8  ?PLT 272 150  ? ?Basic Metabolic Panel: ?Recent Labs  ?Lab 03/06/22 ?1948 03/07/22 ?0604  ?NA 136 137  ?K 3.8 4.3  ?CL 106 108  ?CO2 22 21*  ?GLUCOSE 152*  121*  ?BUN 14 14  ?CREATININE 0.92 0.77  ?CALCIUM 8.7* 8.4*  ?MG  --  1.8  ?PHOS  --  3.2  ? ?GFR: ?Estimated Creatinine Clearance: 126.3 mL/min (by C-G formula based on SCr of 0.77 mg/dL). ?Liver Function Tests: ?Recent Labs  ?Lab 03/06/22 ?1948 03/07/22 ?0604  ?AST 22 18  ?ALT 29 27  ?ALKPHOS 85 72  ?BILITOT 0.6 0.8  ?PROT 7.6 7.2  ?ALBUMIN 3.5 3.3*  ? ?No results for input(s): LIPASE, AMYLASE in the last 168 hours. ?No results for input(s): AMMONIA in the last 168 hours. ?Coagulation Profile: ?Recent Labs  ?Lab 03/06/22 ?1948  ?INR 1.0  ? ?Cardiac Enzymes: ?No results for input(s): CKTOTAL, CKMB,  CKMBINDEX, TROPONINI in the last 168 hours. ?BNP (last 3 results) ?No results for input(s): PROBNP in the last 8760 hours. ?HbA1C: ?No results for input(s): HGBA1C in the last 72 hours. ?CBG: ?No results for input(s): GLUCAP in the last 168 hours. ?Lipid Profile: ?No results for input(s): CHOL, HDL, LDLCALC, TRIG, CHOLHDL, LDLDIRECT in the last 72 hours. ?Thyroid Function Tests: ?No results for input(s): TSH, T4TOTAL, FREET4, T3FREE, THYROIDAB in the last 72 hours. ?Anemia Panel: ?No results for input(s): VITAMINB12, FOLATE, FERRITIN, TIBC, IRON, RETICCTPCT in the last 72 hours. ?Sepsis Labs: ?Recent Labs  ?Lab 03/06/22 ?1948 03/06/22 ?2156  ?LATICACIDVEN 1.2 0.7  ? ? ?Recent Results (from the past 240 hour(s))  ?Resp Panel by RT-PCR (Flu A&B, Covid) Nasopharyngeal Swab     Status: None  ? Collection Time: 03/06/22  7:27 PM  ? Specimen: Nasopharyngeal Swab; Nasopharyngeal(NP) swabs in vial transport medium  ?Result Value Ref Range Status  ? SARS Coronavirus 2 by RT PCR NEGATIVE NEGATIVE Final  ?  Comment: (NOTE) ?SARS-CoV-2 target nucleic acids are NOT DETECTED. ? ?The SARS-CoV-2 RNA is generally detectable in upper respiratory ?specimens during the acute phase of infection. The lowest ?concentration of SARS-CoV-2 viral copies this assay can detect is ?138 copies/mL. A negative result does not preclude SARS-Cov-2 ?infection and should not be used as the sole basis for treatment or ?other patient management decisions. A negative result may occur with  ?improper specimen collection/handling, submission of specimen other ?than nasopharyngeal swab, presence of viral mutation(s) within the ?areas targeted by this assay, and inadequate number of viral ?copies(<138 copies/mL). A negative result must be combined with ?clinical observations, patient history, and epidemiological ?information. The expected result is Negative. ? ?Fact Sheet for Patients:  ?BloggerCourse.com ? ?Fact Sheet for Healthcare  Providers:  ?SeriousBroker.it ? ?This test is no t yet approved or cleared by the [place] FDA and  ?has been authorized for detection and/or diagnosis of SARS-CoV-2 by ?FDA under an Emergency Use Authorization (EUA). This EUA will remain  ?in effect (meaning this test can be used) for the duration of the ?COVID-19 declaration under Section 564(b)(1) of the Act, 21 ?U.S.C.section 360bbb-3(b)(1), unless the authorization is terminated  ?or revoked sooner.  ? ? ?  ? Influenza A by PCR NEGATIVE NEGATIVE Final  ? Influenza B by PCR NEGATIVE NEGATIVE Final  ?  Comment: (NOTE) ?The Xpert Xpress SARS-CoV-2/FLU/RSV plus assay is intended as an aid ?in the diagnosis of influenza from Nasopharyngeal swab specimens and ?should not be used as a sole basis for treatment. Nasal washings and ?aspirates are unacceptable for Xpert Xpress SARS-CoV-2/FLU/RSV ?testing. ? ?Fact Sheet for Patients: ?BloggerCourse.com ? ?Fact Sheet for Healthcare Providers: ?SeriousBroker.it ? ?This test is not yet approved or cleared by the [place] and ?has been  authorized for detection and/or diagnosis of SARS-CoV-2 by ?FDA under an Emergency Use Authorization (EUA). This EUA will remain ?in effect (meaning this test can be used) for the duration of the ?COVID-19 declaration under Section 564(b)(1) of the Act, 21 U.S.C. ?section 360bbb-3(b)(1), unless the authorization is terminated or ?revoked. ? ?Performed at [facility], [address], [place], [place] [address] ?  ?Blood Culture (routine x 2)     Status: None (Preliminary result)  ? Collection Time: 03/06/22  7:48 PM  ? Specimen: Left Antecubital; Blood  ?Result Value Ref Range Status  ? Specimen Description   Final  ?  LEFT ANTECUBITAL BOTTLES DRAWN AEROBIC AND ANAEROBIC  ? Special Requests   Final  ?  Blood Culture results may not be optimal due to an excessive volume of blood received in culture  bottles ?Performed at [facility], [address], [place], [place] [address] ?  ? Culture PENDING  Incomplete  ? Report Status PENDING  Incomplete  ?Blood Culture (routine x 2)     Status: None (Preliminary result)  ? C

## 2022-03-08 DIAGNOSIS — R112 Nausea with vomiting, unspecified: Secondary | ICD-10-CM | POA: Diagnosis not present

## 2022-03-08 DIAGNOSIS — N3001 Acute cystitis with hematuria: Secondary | ICD-10-CM | POA: Diagnosis not present

## 2022-03-08 DIAGNOSIS — N200 Calculus of kidney: Secondary | ICD-10-CM | POA: Diagnosis not present

## 2022-03-08 DIAGNOSIS — R9431 Abnormal electrocardiogram [ECG] [EKG]: Secondary | ICD-10-CM | POA: Diagnosis not present

## 2022-03-08 LAB — BASIC METABOLIC PANEL
Anion gap: 7 (ref 5–15)
BUN: 16 mg/dL (ref 6–20)
CO2: 23 mmol/L (ref 22–32)
Calcium: 8.5 mg/dL — ABNORMAL LOW (ref 8.9–10.3)
Chloride: 107 mmol/L (ref 98–111)
Creatinine, Ser: 0.82 mg/dL (ref 0.44–1.00)
GFR, Estimated: >60 mL/min (ref >60)
Glucose, Bld: 143 mg/dL — ABNORMAL HIGH (ref 70–99)
Potassium: 4 mmol/L (ref 3.5–5.1)
Sodium: 137 mmol/L (ref 135–145)

## 2022-03-08 LAB — CBC
HCT: 38.5 % (ref 36.0–46.0)
Hemoglobin: 12.4 g/dL (ref 12.0–15.0)
MCH: 30.3 pg (ref 26.0–34.0)
MCHC: 32.2 g/dL (ref 30.0–36.0)
MCV: 94.1 fL (ref 80.0–100.0)
Platelets: 254 10*3/uL (ref 150–400)
RBC: 4.09 MIL/uL (ref 3.87–5.11)
RDW: 13.2 % (ref 11.5–15.5)
WBC: 9.5 10*3/uL (ref 4.0–10.5)
nRBC: 0 % (ref 0.0–0.2)

## 2022-03-08 LAB — URINE CULTURE: Culture: NO GROWTH

## 2022-03-08 MED ORDER — POLYETHYLENE GLYCOL 3350 17 G PO PACK
17.0000 g | PACK | Freq: Two times a day (BID) | ORAL | Status: DC
  Administered 2022-03-08 – 2022-03-09 (×3): 17 g via ORAL
  Filled 2022-03-08 (×3): qty 1

## 2022-03-08 MED ORDER — SENNOSIDES-DOCUSATE SODIUM 8.6-50 MG PO TABS
1.0000 | ORAL_TABLET | Freq: Once | ORAL | Status: AC
  Administered 2022-03-08: 1 via ORAL
  Filled 2022-03-08: qty 1

## 2022-03-08 MED ORDER — SENNOSIDES-DOCUSATE SODIUM 8.6-50 MG PO TABS
1.0000 | ORAL_TABLET | Freq: Two times a day (BID) | ORAL | Status: DC
  Administered 2022-03-08 – 2022-03-09 (×3): 1 via ORAL
  Filled 2022-03-08 (×3): qty 1

## 2022-03-08 NOTE — Progress Notes (Signed)
?PROGRESS NOTE ? ? ? ?[name]  ZOX:[id number] DOB: [birth year] DOA: 03/06/2022 ?PCP: Daryll Drown, NP ? ? ?Brief Narrative:  ?[name] is a 46 y.o. female with medical history significant for COPD, kidney stones and obesity who presents to the emergency department due to bilateral flank pain which started 4 days prior to admission worsening more acutely over the past 24 hours prior to admission.  Recent bilateral renal stent placement by urology in the outpatient setting on Monday, 03/02/2022.  Given symptoms and initial lab work patient mated for UTI after recent instrumentation and stent placement with previous nephrolithiasis and obstruction. ? ? ?Assessment & Plan: ?  ?Principal Problem: ?  UTI (urinary tract infection) ?Active Problems: ?  COPD (chronic obstructive pulmonary disease) (HCC) ?  Stone, kidney ?  Morbid obesity (HCC) ?  Nausea & vomiting ?  Prolonged QT interval ? ?Sepsis secondary to UTI POA ?Bilateral nephrolithiasis with stent placement ?Leukocytosis, tachycardia and UTI at intake -generally improving ?Continue IV cefepime - cultures pending -hopeful to transition to p.o. antibiotics in the next 24 to 48 hours for discharge ?Pain moderately well controlled on current regimen -continues to have occasional shooting pains but currently well controlled ?Urology following - no indication for procedure - outpatient follow up next week with Dr Ronne Binning as scheduled ? ?Nausea and vomiting, resolved ?Continue IV Compazine as needed ? ?Prolonged QTc (497 ms) ?Avoid QT prolonging drugs ? ?COPD (not in acute exacerbation) ?Continue albuterol per home regimen ? ?Class III obesity (BMI 50.21 kg/m?) ?Diet and lifestyle modification ?  ?DVT prophylaxis: SCDs ?Advance Care Planning: CODE STATUS: Full code ?Consults: Urology ?Family Communication: None at bedside ? ?Status is: Inpatient ? ?Dispo: The patient is from: Home ?             Anticipated d/c is to: Home ?             Anticipated d/c  date is: 24-48h ?             Patient currently not medically stable for discharge ? ?Consultants:  ?Urology ? ?Procedures:  ?None ? ?Antimicrobials:  ?Cefepime  ? ?Subjective: ?No acute issues/events overnight, occasional shooting flank pain but generally well controlled denies nausea vomiting headache fevers chills or chest pain. ? ?Objective: ?Vitals:  ? 03/07/22 0450 03/07/22 0824 03/07/22 2004 03/08/22 0529  ?BP: (!) 123/54 99/61 (!) 159/97 (!) 146/98  ?Pulse: 75 74 90 72  ?Resp: 18  18 18   ?Temp:  97.6 ?F (36.4 ?C) 97.9 ?F (36.6 ?C) 97.7 ?F (36.5 ?C)  ?TempSrc:  Oral Oral Oral  ?SpO2: 99% 100% 98% 100%  ?Weight:      ?Height:      ? ? ?Intake/Output Summary (Last 24 hours) at 03/08/2022 0705 ?Last data filed at 03/07/2022 1700 ?Gross per 24 hour  ?Intake 1377.36 ml  ?Output 1 ml  ?Net 1376.36 ml  ? ? ?Filed Weights  ? 03/06/22 1916  ?Weight: (!) 141.1 kg  ? ? ?Examination: ? ?General:  Pleasantly resting in bed, No acute distress. ?HEENT:  Normocephalic atraumatic.  Sclerae nonicteric, noninjected.  Extraocular movements intact bilaterally. ?Neck:  Without mass or deformity.  Trachea is midline. ?Lungs:  Clear to auscultate bilaterally without rhonchi, wheeze, or rales. ?Heart:  Regular rate and rhythm.  Without murmurs, rubs, or gallops. ?Abdomen:  Soft, nontender, nondistended.  Without guarding or rebound. ?Extremities: Without cyanosis, clubbing, edema, or obvious deformity. ?Vascular:  Dorsalis pedis and posterior tibial pulses palpable bilaterally. ?Skin:  Warm and dry, no erythema, no ulcerations. ? ?CBC: ?Recent Labs  ?Lab 03/06/22 ?1948 03/07/22 ?0604 03/08/22 ?0541  ?WBC 14.8* 11.5* 9.5  ?NEUTROABS 10.9*  --   --   ?HGB 14.3 13.3 12.4  ?HCT 43.1 40.2 38.5  ?MCV 91.5 94.8 94.1  ?PLT 272 150 254  ? ? ?Basic Metabolic Panel: ?Recent Labs  ?Lab 03/06/22 ?1948 03/07/22 ?0604 03/08/22 ?0541  ?NA 136 137 137  ?K 3.8 4.3 4.0  ?CL 106 108 107  ?CO2 22 21* 23  ?GLUCOSE 152* 121* 143*  ?BUN 14 14 16   ?CREATININE  0.92 0.77 0.82  ?CALCIUM 8.7* 8.4* 8.5*  ?MG  --  1.8  --   ?PHOS  --  3.2  --   ? ? ?GFR: ?Estimated Creatinine Clearance: 123.2 mL/min (by C-G formula based on SCr of 0.82 mg/dL). ?Liver Function Tests: ?Recent Labs  ?Lab 03/06/22 ?1948 03/07/22 ?0604  ?AST 22 18  ?ALT 29 27  ?ALKPHOS 85 72  ?BILITOT 0.6 0.8  ?PROT 7.6 7.2  ?ALBUMIN 3.5 3.3*  ? ? ?No results for input(s): LIPASE, AMYLASE in the last 168 hours. ?No results for input(s): AMMONIA in the last 168 hours. ?Coagulation Profile: ?Recent Labs  ?Lab 03/06/22 ?1948  ?INR 1.0  ? ? ?Cardiac Enzymes: ?No results for input(s): CKTOTAL, CKMB, CKMBINDEX, TROPONINI in the last 168 hours. ?BNP (last 3 results) ?No results for input(s): PROBNP in the last 8760 hours. ?HbA1C: ?No results for input(s): HGBA1C in the last 72 hours. ?CBG: ?No results for input(s): GLUCAP in the last 168 hours. ?Lipid Profile: ?No results for input(s): CHOL, HDL, LDLCALC, TRIG, CHOLHDL, LDLDIRECT in the last 72 hours. ?Thyroid Function Tests: ?No results for input(s): TSH, T4TOTAL, FREET4, T3FREE, THYROIDAB in the last 72 hours. ?Anemia Panel: ?No results for input(s): VITAMINB12, FOLATE, FERRITIN, TIBC, IRON, RETICCTPCT in the last 72 hours. ?Sepsis Labs: ?Recent Labs  ?Lab 03/06/22 ?1948 03/06/22 ?2156  ?LATICACIDVEN 1.2 0.7  ? ? ? ?Recent Results (from the past 240 hour(s))  ?Resp Panel by RT-PCR (Flu A&B, Covid) Nasopharyngeal Swab     Status: None  ? Collection Time: 03/06/22  7:27 PM  ? Specimen: Nasopharyngeal Swab; Nasopharyngeal(NP) swabs in vial transport medium  ?Result Value Ref Range Status  ? SARS Coronavirus 2 by RT PCR NEGATIVE NEGATIVE Final  ?  Comment: (NOTE) ?SARS-CoV-2 target nucleic acids are NOT DETECTED. ? ?The SARS-CoV-2 RNA is generally detectable in upper respiratory ?specimens during the acute phase of infection. The lowest ?concentration of SARS-CoV-2 viral copies this assay can detect is ?138 copies/mL. A negative result does not preclude  SARS-Cov-2 ?infection and should not be used as the sole basis for treatment or ?other patient management decisions. A negative result may occur with  ?improper specimen collection/handling, submission of specimen other ?than nasopharyngeal swab, presence of viral mutation(s) within the ?areas targeted by this assay, and inadequate number of viral ?copies(<138 copies/mL). A negative result must be combined with ?clinical observations, patient history, and epidemiological ?information. The expected result is Negative. ? ?Fact Sheet for Patients:  ?BloggerCourse.comhttps://www.fda.gov/media/152166/download ? ?Fact Sheet for Healthcare Providers:  ?SeriousBroker.ithttps://www.fda.gov/media/152162/download ? ?This test is no t yet approved or cleared by the [place] FDA and  ?has been authorized for detection and/or diagnosis of SARS-CoV-2 by ?FDA under an Emergency Use Authorization (EUA). This EUA will remain  ?in effect (meaning this test can be used) for the duration of the ?COVID-19 declaration under Section 564(b)(1) of the Act, 21 ?U.S.C.section 360bbb-3(b)(1), unless the  authorization is terminated  ?or revoked sooner.  ? ? ?  ? Influenza A by PCR NEGATIVE NEGATIVE Final  ? Influenza B by PCR NEGATIVE NEGATIVE Final  ?  Comment: (NOTE) ?The Xpert Xpress SARS-CoV-2/FLU/RSV plus assay is intended as an aid ?in the diagnosis of influenza from Nasopharyngeal swab specimens and ?should not be used as a sole basis for treatment. Nasal washings and ?aspirates are unacceptable for Xpert Xpress SARS-CoV-2/FLU/RSV ?testing. ? ?Fact Sheet for Patients: ?BloggerCourse.com ? ?Fact Sheet for Healthcare Providers: ?SeriousBroker.it ? ?This test is not yet approved or cleared by the [place] FDA and ?has been authorized for detection and/or diagnosis of SARS-CoV-2 by ?FDA under an Emergency Use Authorization (EUA). This EUA will remain ?in effect (meaning this test can be used) for the duration of  the ?COVID-19 declaration under Section 564(b)(1) of the Act, 21 U.S.C. ?section 360bbb-3(b)(1), unless the authorization is terminated or ?revoked. ? ?Performed at [facility], [address], [place], [place] [address] ?  ?[employer]

## 2022-03-09 DIAGNOSIS — N3001 Acute cystitis with hematuria: Secondary | ICD-10-CM | POA: Diagnosis not present

## 2022-03-09 MED ORDER — ONDANSETRON HCL 4 MG PO TABS: 4.0000 mg | ORAL_TABLET | Freq: Three times a day (TID) | ORAL | 1 refills | Status: DC | PRN

## 2022-03-09 MED ORDER — HYDROCODONE-ACETAMINOPHEN 5-325 MG PO TABS: 1.0000 | ORAL_TABLET | ORAL | 0 refills | Status: DC | PRN

## 2022-03-09 MED ORDER — CEPHALEXIN 250 MG PO CAPS: 250.0000 mg | ORAL_CAPSULE | Freq: Four times a day (QID) | ORAL | 0 refills | Status: AC

## 2022-03-09 MED ORDER — HYDROCODONE-ACETAMINOPHEN 5-325 MG PO TABS
1.0000 | ORAL_TABLET | ORAL | Status: DC | PRN
  Administered 2022-03-09: 2 via ORAL
  Filled 2022-03-09: qty 2

## 2022-03-09 NOTE — Discharge Summary (Signed)
Physician Discharge Summary  ?[name] JAS:[id number] DOB: December 24, 1975 DOA: 03/06/2022 ? ?PCP: Daryll Drown, NP ? ?Admit date: 03/06/2022 ?Discharge date: 03/09/2022 ? ?Admitted From: Home ?Disposition: Home ? ?Recommendations for Outpatient Follow-up:  ?Follow up with PCP in 1-2 weeks ?Follow-up with urology as scheduled on the 17th ? ?Home Health: None ?Equipment/Devices: None ? ?Discharge Condition: Stable ?CODE STATUS: Full ?Diet recommendation: Bland diet, advance as tolerated ? ?Brief/Interim Summary: ?[name] is a 46 y.o. female with medical history significant for COPD, kidney stones and obesity who presents to the emergency department due to bilateral flank pain which started 4 days prior to admission worsening more acutely over the past 24 hours prior to admission.  Recent bilateral renal stent placement by urology in the outpatient setting on Monday, 03/02/2022.  Given symptoms and initial lab work patient mated for UTI after recent instrumentation and stent placement with previous nephrolithiasis and obstruction. ? ?Patient admitted with sepsis secondary to UTI, bilateral ureteral stent placement earlier last week due to bilateral obstructing nephrolithiasis.  Postprocedure patient had worsening pain fevers chills urine culture negative but given symptoms patient was treated with cephalosporin with improving symptoms, transition to cephalexin at discharge for ongoing treatment given ureteral stents and concern for complex UTI.  At this time patient otherwise stable and agreeable for discharge, continue antibiotics as discussed, ongoing pain control with hydrocodone until follow-up with urology next week. ? ?Discharge Diagnoses:  ?Principal Problem: ?  UTI (urinary tract infection) ?Active Problems: ?  COPD (chronic obstructive pulmonary disease) (HCC) ?  Stone, kidney ?  Morbid obesity (HCC) ?  Nausea & vomiting ?  Prolonged QT interval ? ? ? ?Discharge Instructions ? ?Discharge Instructions    ? ? Discharge patient   Complete by: As directed ?  ? Discharge disposition: 01-Home or Self Care  ? Discharge patient date: 03/09/2022  ? ?  ? ?Allergies as of 03/09/2022   ? ?   Reactions  ? Hydrocodone Nausea And Vomiting  ? Morphine And Related Hives, Other (See Comments)  ? Welps  ? Vicodin [hydrocodone-acetaminophen] Nausea And Vomiting  ? ?  ? ?  ?Medication List  ?  ? ?STOP taking these medications   ? ?albuterol 108 (90 Base) MCG/ACT inhaler ?Commonly known as: VENTOLIN HFA ?  ?oxyCODONE-acetaminophen 5-325 MG tablet ?Commonly known as: PERCOCET/ROXICET ?  ?Potassium Citrate 15 MEQ (1620 MG) Tbcr ?  ? ?  ? ?TAKE these medications   ? ?B-12 PO ?Take 1 tablet by mouth daily. ?  ?cephALEXin 250 MG capsule ?Commonly known as: KEFLEX ?Take 1 capsule (250 mg total) by mouth 4 (four) times daily for 5 days. ?  ?cetirizine 10 MG tablet ?Commonly known as: ZYRTEC ?Take 10 mg by mouth daily as needed for allergies. ?  ?cholecalciferol 25 MCG (1000 UNIT) tablet ?Commonly known as: VITAMIN D3 ?Take 1,000 Units by mouth daily. ?  ?HYDROcodone-acetaminophen 5-325 MG tablet ?Commonly known as: NORCO/VICODIN ?Take 1-2 tablets by mouth every 4 (four) hours as needed for severe pain or moderate pain. ?  ?multivitamin with minerals Tabs tablet ?Take 1 tablet by mouth daily. ?  ?ondansetron 4 MG tablet ?Commonly known as: Zofran ?Take 1 tablet (4 mg total) by mouth every 8 (eight) hours as needed for nausea or vomiting. ?  ? ?  ? ? ?Allergies  ?Allergen Reactions  ? Hydrocodone Nausea And Vomiting  ? Morphine And Related Hives and Other (See Comments)  ?  Welps  ? Vicodin [Hydrocodone-Acetaminophen] Nausea And  Vomiting  ? ? ?Consultations: ?Neurology  ? ?Procedures/Studies: ?DG Chest Port 1 View ? ?Result Date: 03/06/2022 ?CLINICAL DATA:  Possible sepsis EXAM: PORTABLE CHEST 1 VIEW COMPARISON:  09/18/2021 FINDINGS: The heart size and mediastinal contours are within normal limits. Both lungs are clear. The visualized skeletal  structures are unremarkable. IMPRESSION: No active disease. Electronically Signed   By: Alcide CleverMark  Lukens M.D.   On: 03/06/2022 21:05  ? ?DG C-Arm 1-60 Min-No Report ? ?Result Date: 03/02/2022 ?Fluoroscopy was utilized by the requesting physician.  No radiographic interpretation.  ? ?CT Renal Stone Study ? ?Result Date: 03/06/2022 ?CLINICAL DATA:  Flank pain, kidney stone suspected. Bilateral flank pain and nausea. EXAM: CT ABDOMEN AND PELVIS WITHOUT CONTRAST TECHNIQUE: Multidetector CT imaging of the abdomen and pelvis was performed following the standard protocol without IV contrast. RADIATION DOSE REDUCTION: This exam was performed according to the departmental dose-optimization program which includes automated exposure control, adjustment of the mA and/or kV according to patient size and/or use of iterative reconstruction technique. COMPARISON:  Abdominopelvic CT 05/31/2021. Renal ultrasound 11/11/2021. FINDINGS: Lower chest: 3 mm subpleural nodule in the left lower lobe on image 17/4. This area was not clearly imaged previously, and this is not definitely new. Regardless, no follow-up necessary per consensus guidelines. The visualized lung bases are otherwise clear. There is no significant pleural or pericardial effusion. A moderate size hiatal hernia is present. Hepatobiliary: Moderate heterogeneous hepatic steatosis. There is mild contour irregularity of the liver suspicious for early cirrhosis. No evidence of significant biliary dilatation status post cholecystectomy. Pancreas: Unremarkable. No pancreatic ductal dilatation or surrounding inflammatory changes. Spleen: Normal in size without focal abnormality. Adrenals/Urinary Tract: Both adrenal glands appear normal. Interval placement of bilateral double-J ureteral stents which appear well positioned. No definite stone fragments are seen along the course of the stents. Multiple small caliceal calculi are present in both kidneys, right greater than left. There is  persistent bilateral hydronephrosis with associated perinephric and periureteral soft tissue stranding. Compared with the prior CT, this has increased on the right. The bladder is decompressed without focal abnormality. Stomach/Bowel: No enteric contrast administered. The stomach appears unremarkable for its degree of distension. No evidence of bowel wall thickening, distention or surrounding inflammatory change. The appendix appears normal. Mild distal colonic diverticulosis. Vascular/Lymphatic: There are no enlarged abdominal or pelvic lymph nodes. No significant vascular findings on noncontrast imaging. Reproductive: Status post hysterectomy. No evidence of adnexal mass. Other: No evidence of abdominal wall mass or hernia. No ascites. Musculoskeletal: No acute or significant osseous findings. Sacroiliac degenerative changes bilaterally. IMPRESSION: 1. Bilateral ureteral stents are in place and appear well positioned. There is persistent moderate hydronephrosis bilaterally which has increased on the right compared with previous CT of 9 months ago. In addition, there is periureteral and perinephric soft tissue stranding bilaterally. Findings are nonspecific and could reflect stent dysfunction or urinary tract infection. 2. Numerous small caliceal renal calculi bilaterally. No ureteral or bladder calculi identified. 3. Heterogeneous hepatic steatosis. 4. Moderate size hiatal hernia. Electronically Signed   By: Carey BullocksWilliam  Veazey M.D.   On: 03/06/2022 21:08   ? ? ?Subjective: No acute issues or events overnight ? ? ?Discharge Exam: ?Vitals:  ? 03/08/22 2141 03/09/22 0607  ?BP: (!) 156/95 120/68  ?Pulse: 81 77  ?Resp: 20 20  ?Temp: 98.2 ?F (36.8 ?C) 97.9 ?F (36.6 ?C)  ?SpO2: 99% 99%  ? ?Vitals:  ? 03/07/22 2004 03/08/22 [id number] 03/08/22 2141 03/09/22 [id number]  ?BP: (!) 159/97 (!) 146/98 (!) 156/95  120/68  ?Pulse: 90 72 81 77  ?Resp: 18 18 20 20   ?Temp: 97.9 ?F (36.6 ?C) 97.7 ?F (36.5 ?C) 98.2 ?F (36.8 ?C) 97.9 ?F (36.6 ?C)   ?TempSrc: Oral Oral Oral Oral  ?SpO2: 98% 100% 99% 99%  ?Weight:      ?Height:      ? ? ?General: Pt is alert, awake, not in acute distress ?Cardiovascular: RRR, S1/S2 +, no rubs, no gallops ?Respiratory: CTA

## 2022-03-10 ENCOUNTER — Telehealth: Payer: Self-pay

## 2022-03-10 NOTE — Telephone Encounter (Signed)
Transition Care Management Follow-up Telephone Call ?Date of discharge and from where: [facility] 03-09-22 Dx: UTI ?How have you been since you were released from the hospital? Doing good  ?Any questions or concerns? No ? ?Items Reviewed: ?Did the pt receive and understand the discharge instructions provided? Yes  ?Medications obtained and verified? Yes  ?Other? No  ?Any new allergies since your discharge? no ?Dietary orders reviewed? Yes ?Do you have support at home? Yes  ? ?Home Care and Equipment/Supplies: ?Were home health services ordered? not applicable ?If so, what is the name of the agency? na  ?Has the agency set up a time to come to the patient's home? not applicable ?Were any new equipment or medical supplies ordered?  No ?What is the name of the medical supply agency? na ?Were you able to get the supplies/equipment? not applicable ?Do you have any questions related to the use of the equipment or supplies? na ? ?Functional Questionnaire: (I = Independent and D = Dependent) ?ADLs: I ? ?Bathing/Dressing- I ? ?Meal Prep- I ? ?Eating- I ? ?Maintaining continence- I ? ?Transferring/Ambulation- I ? ?Managing Meds- I ? ?Follow up appointments reviewed: ? ?PCP Hospital f/u appt confirmed? Yes  Scheduled to see Bennie Pierini FNP on 03-16-22 @ 4pm. ?Specialist Hospital f/u appt confirmed? Yes  Scheduled to see Dr Ronne Binning on 03-16-22 @ 1010am. ?Are transportation arrangements needed? No  ?If their condition worsens, is the pt aware to call PCP or go to the Emergency Dept.? Yes ?Was the patient provided with contact information for the PCP's office or ED? Yes ?Was to pt encouraged to call back with questions or concerns? Yes  ?

## 2022-03-11 LAB — CULTURE, BLOOD (ROUTINE X 2)
Culture: NO GROWTH
Culture: NO GROWTH
Special Requests: ADEQUATE

## 2022-03-16 ENCOUNTER — Ambulatory Visit (INDEPENDENT_AMBULATORY_CARE_PROVIDER_SITE_OTHER): Payer: 59 | Admitting: Nurse Practitioner

## 2022-03-16 ENCOUNTER — Encounter: Payer: Self-pay | Admitting: Nurse Practitioner

## 2022-03-16 ENCOUNTER — Ambulatory Visit (INDEPENDENT_AMBULATORY_CARE_PROVIDER_SITE_OTHER): Payer: 59 | Admitting: Urology

## 2022-03-16 VITALS — BP 146/80 | HR 93

## 2022-03-16 VITALS — BP 142/88 | HR 86 | Temp 98.5°F | Resp 20 | Ht 66.0 in | Wt 311.0 lb

## 2022-03-16 DIAGNOSIS — R7989 Other specified abnormal findings of blood chemistry: Secondary | ICD-10-CM | POA: Diagnosis not present

## 2022-03-16 DIAGNOSIS — N200 Calculus of kidney: Secondary | ICD-10-CM

## 2022-03-16 DIAGNOSIS — Z09 Encounter for follow-up examination after completed treatment for conditions other than malignant neoplasm: Secondary | ICD-10-CM | POA: Diagnosis not present

## 2022-03-16 NOTE — H&P (View-Only) (Signed)
? ?03/16/2022 ?11:06 AM  ? ?[name] ?1976/08/02 ?[id number] ? ?Referring provider: Daryll Drown, NP ?[address] ?[place],  [place] [address] ? ?Followup nephrolithiasis ? ? ?HPI: ?[name] is a 45yo here for followup for nephrolithiasis. She underwent left ureteroscopic stone extraction and right stent placement 2 weeks ago. She has moderate bilateral flank pain with the stent in place. No worsening LUTS. No hematuria or dysuria. No other complaints today ? ? ?PMH: ?Past Medical History:  ?Diagnosis Date  ? Asthma   ? COPD (chronic obstructive pulmonary disease) (HCC)   ? sees PCP  ? Depression   ? GERD (gastroesophageal reflux disease)   ? History of kidney stones   ? Hypertension   ? pt. denies  never taken meds  ? Pneumonia   ? PONV (postoperative nausea and vomiting)   ? ? ?Surgical History: ?Past Surgical History:  ?Procedure Laterality Date  ? ABDOMINAL HYSTERECTOMY    ? CHOLECYSTECTOMY    ? CYSTOSCOPY W/ RETROGRADES  08/04/2011  ? Procedure: CYSTOSCOPY WITH RETROGRADE PYELOGRAM;  Surgeon: Ky Barban;  Location: [facility];  Service: Urology;  Laterality: Right;  Right Retrograde  ? CYSTOSCOPY WITH RETROGRADE PYELOGRAM, URETEROSCOPY AND STENT PLACEMENT Left 07/24/2021  ? Procedure: CYSTOSCOPY WITH RETROGRADE PYELOGRAM, URETEROSCOPY AND STENT PLACEMENT;  Surgeon: Malen Gauze, MD;  Location: [facility];  Service: Urology;  Laterality: Left;  ? CYSTOSCOPY WITH RETROGRADE PYELOGRAM, URETEROSCOPY AND STENT PLACEMENT Bilateral 03/02/2022  ? Procedure: CYSTOSCOPY WITH RETROGRADE PYELOGRAM, RIGHT URETEROSCOPY AND BILATERAL STENT PLACEMENT;  Surgeon: Malen Gauze, MD;  Location: [facility];  Service: Urology;  Laterality: Bilateral;  ? HOLMIUM LASER APPLICATION N/A 04/15/2018  ? Procedure: HOLMIUM LASER APPLICATION;  Surgeon: Crist Fat, MD;  Location: [facility];  Service: Urology;  Laterality: N/A;  ? HOLMIUM LASER APPLICATION Left 07/24/2021  ? Procedure: HOLMIUM LASER APPLICATION;   Surgeon: Malen Gauze, MD;  Location: [facility];  Service: Urology;  Laterality: Left;  ? HOLMIUM LASER APPLICATION Bilateral 03/02/2022  ? Procedure: HOLMIUM LASER APPLICATION;  Surgeon: Malen Gauze, MD;  Location: [facility];  Service: Urology;  Laterality: Bilateral;  ? LITHOTRIPSY    ? NEPHROLITHOTOMY Right 04/15/2018  ? Procedure: RIGHT NEPHROLITHOTOMY PERCUTANEOUS WITH ACCESS;  Surgeon: Crist Fat, MD;  Location: [facility];  Service: Urology;  Laterality: Right;  ? NEPHROLITHOTOMY Right 12/29/2018  ? Procedure: NEPHROLITHOTOMY PERCUTANEOUS;  Surgeon: Crist Fat, MD;  Location: [facility];  Service: Urology;  Laterality: Right;  ? PERCUTANEOUS NEPHROLITHOTRIPSY  2012  ? both sides kidney   ? STONE EXTRACTION WITH BASKET Bilateral 03/02/2022  ? Procedure: STONE EXTRACTION WITH BASKET;  Surgeon: Malen Gauze, MD;  Location: [facility];  Service: Urology;  Laterality: Bilateral;  ? TUBAL LIGATION    ? ? ?Home Medications:  ?Allergies as of 03/16/2022   ? ?   Reactions  ? Hydrocodone Nausea And Vomiting  ? Morphine And Related Hives, Other (See Comments)  ? Welps  ? Vicodin [hydrocodone-acetaminophen] Nausea And Vomiting  ? ?  ? ?  ?Medication List  ?  ? ?  ? Accurate as of March 16, 2022 11:06 AM. If you have any questions, ask your nurse or doctor.  ?  ?  ? ?  ? ?B-12 PO ?Take 1 tablet by mouth daily. ?  ?cetirizine 10 MG tablet ?Commonly known as: ZYRTEC ?Take 10 mg by mouth daily as needed for allergies. ?  ?cholecalciferol 25 MCG (1000 UNIT) tablet ?Commonly known  as: VITAMIN D3 ?Take 1,000 Units by mouth daily. ?  ?HYDROcodone-acetaminophen 5-325 MG tablet ?Commonly known as: NORCO/VICODIN ?Take 1-2 tablets by mouth every 4 (four) hours as needed for severe pain or moderate pain. ?  ?multivitamin with minerals Tabs tablet ?Take 1 tablet by mouth daily. ?  ?ondansetron 4 MG tablet ?Commonly known as: Zofran ?Take 1 tablet (4 mg total) by mouth every 8 (eight) hours as needed for nausea or  vomiting. ?  ? ?  ? ? ?Allergies:  ?Allergies  ?Allergen Reactions  ? Hydrocodone Nausea And Vomiting  ? Morphine And Related Hives and Other (See Comments)  ?  Welps  ? Vicodin [Hydrocodone-Acetaminophen] Nausea And Vomiting  ? ? ?Family History: ?Family History  ?Problem Relation Age of Onset  ? COPD Mother   ? COPD Father   ? Heart attack Father   ? ? ?Social History:  reports that she quit smoking about 13 months ago. Her smoking use included cigarettes. She has a 9.00 pack-year smoking history. She has never used smokeless tobacco. She reports that she does not drink alcohol and does not use drugs. ? ?ROS: ?All other review of systems were reviewed and are negative except what is noted above in HPI ? ?Physical Exam: ?BP (!) 146/80   Pulse 93   LMP 11/05/2015   ?Constitutional:  Alert and oriented, No acute distress. ?HEENT: [place] AT, moist mucus membranes.  Trachea midline, no masses. ?Cardiovascular: No clubbing, cyanosis, or edema. ?Respiratory: Normal respiratory effort, no increased work of breathing. ?GI: Abdomen is soft, nontender, nondistended, no abdominal masses ?GU: No CVA tenderness.  ?Lymph: No cervical or inguinal lymphadenopathy. ?Skin: No rashes, bruises or suspicious lesions. ?Neurologic: Grossly intact, no focal deficits, moving all 4 extremities. ?Psychiatric: Normal mood and affect. ? ?Laboratory Data: ?Lab Results  ?Component Value Date  ? WBC 9.5 03/08/2022  ? HGB 12.4 03/08/2022  ? HCT 38.5 03/08/2022  ? MCV 94.1 03/08/2022  ? PLT 254 03/08/2022  ? ? ?Lab Results  ?Component Value Date  ? CREATININE 0.82 03/08/2022  ? ? ?No results found for: PSA ? ?No results found for: TESTOSTERONE ? ?Lab Results  ?Component Value Date  ? HGBA1C  01/20/2008  ?  5.6 ?(NOTE)   The ADA recommends the following therapeutic goals for glycemic   control related to Hgb A1C measurement:   Goal of Therapy:   < 7.0% Hgb A1C   Action Suggested:  > 8.0% Hgb A1C   Ref:  Diabetes Care, 22, Suppl. 1, 1999   ? ? ?Urinalysis ?   ?Component Value Date/Time  ? COLORURINE ORANGE (A) 03/06/2022 1925  ? APPEARANCEUR CLEAR 03/06/2022 1925  ? APPEARANCEUR Clear 02/10/2022 0822  ? LABSPEC 1.025 03/06/2022 1925  ? PHURINE 6.5 03/06/2022 1925  ? GLUCOSEU NEGATIVE 03/06/2022 1925  ? HGBUR LARGE (A) 03/06/2022 1925  ? BILIRUBINUR SMALL (A) 03/06/2022 1925  ? BILIRUBINUR Negative 02/10/2022 0822  ? Lavenia Atlas NEGATIVE 03/06/2022 1925  ? PROTEINUR >300 (A) 03/06/2022 1925  ? UROBILINOGEN 0.2 02/12/2015 1932  ? NITRITE POSITIVE (A) 03/06/2022 1925  ? LEUKOCYTESUR MODERATE (A) 03/06/2022 1925  ? ? ?Lab Results  ?Component Value Date  ? LABMICR See below: 02/10/2022  ? WBCUA 6-10 (A) 02/10/2022  ? LABEPIT 0-10 02/10/2022  ? MUCUS Present 02/10/2022  ? BACTERIA NONE SEEN 03/06/2022  ? ? ?Pertinent Imaging: ?CT 03/06/2022: Images reviewed and discussed with the patient ?Results for orders placed during the hospital encounter of 07/02/21 ? ?DG Abd 1 View ? ?  Narrative ?CLINICAL DATA:  Left-sided flank pain. ? ?EXAM: ?ABDOMEN - 1 VIEW ? ?COMPARISON:  Ultrasound renal 07/02/2021, CT renal 10/10/2019 ? ?FINDINGS: ?Right upper quadrant surgical clips. The bowel gas pattern is ?normal. No radio-opaque calculi or other significant radiographic ?abnormality are seen. ? ?IMPRESSION: ?Negative radiograph. ? ?These results and ultrasound renal results from same day will be ?called to the ordering clinician or representative by the ?Radiologist Assistant, and communication documented in the PACS or ?Clario Dashboard. ? ? ?Electronically Signed ?By: Morgane  Naveau M.D. ?On: 07/03/2021 21:41 ? ?No results found for this or any previous visit. ? ?No results found for this or any previous visit. ? ?No results found for this or any previous visit. ? ?Results for orders placed during the hospital encounter of 11/11/21 ? ?Ultrasound renal complete ? ?Narrative ?CLINICAL DATA:  Nephrolithiasis follow-up. ? ?EXAM: ?RENAL / URINARY TRACT ULTRASOUND  COMPLETE ? ?COMPARISON:  August 28, 2021 ? ?FINDINGS: ?Right Kidney: ? ?Renal measurements: 12.9 cm x 5.3 cm x 6.6 cm = volume: 235.72 mL. ?Echogenicity within normal limits. A 9 mm shadowing echogenic renal ?stone is seen within the lowe

## 2022-03-16 NOTE — Patient Instructions (Signed)
Thyroid-Stimulating Hormone Test Why am I having this test? The thyroid is a gland in the lower front of the neck. It makes hormones that affect many body parts and systems, including the system that affects how quickly the body burns fuel for energy (metabolism). The pituitary gland is located just below the brain, behind the eyes and nasal passages. It helps maintain thyroid hormone levels and thyroid gland function. You may have a thyroid-stimulating hormone (TSH) test if you have possible symptoms of abnormal thyroid hormone levels. This test can help your health care provider: Diagnose a disorder of the thyroid gland or pituitary gland. Manage your condition and treatment if you have an underactive thyroid (hypothyroidism) or an overactive thyroid (hyperthyroidism). Newborn babies may have this test done to screen for hypothyroidism that is present at birth (congenital). What is being tested? This test measures the amount of TSH in your blood. TSH may also be called thyrotropin. When the thyroid does not make enough hormones, the pituitary gland releases TSH into the bloodstream to stimulate the thyroid gland to make more hormones. What kind of sample is taken?     A blood sample is required for this test. It is usually collected by inserting a needle into a blood vessel. For newborns, a small amount of blood may be collected from the umbilical cord, or by using a small needle to prick the baby's heel (heel stick). Tell a health care provider about: All medicines you are taking, including vitamins, herbs, eye drops, creams, and over-the-counter medicines. Any bleeding problems you have. Any surgeries you have had. Any medical conditions you have. Whether you are pregnant or may be pregnant. How are the results reported? Your test results will be reported as a value that indicates how much TSH is in your blood. Your health care provider will compare your results to normal ranges that were  established after testing a large group of people (reference ranges). Reference ranges may vary among labs and hospitals. For this test, common reference ranges are: Adult: 2-10 microunits/mL or 2-10 milliunits/L. Newborn: Heel stick: 3-18 microunits/mL or 3-18 milliunits/L. Umbilical cord: 3-12 microunits/mL or 3-12 milliunits/L. What do the results mean? Results that are within the reference range are considered normal. This means that you have a normal amount of TSH in your blood. Results that are higher than the reference range mean that your TSH levels are too high. This may mean: Your thyroid gland is not making enough thyroid hormones. Your thyroid medicine dosage is too low. You have a tumor on your pituitary gland. This is rare. Results that are lower than the reference range mean that your TSH levels are too low. This may be caused by hyperthyroidism or by a problem with the pituitary gland function. Talk with your health care provider about what your results mean. Questions to ask your health care provider Ask your health care provider, or the department that is doing the test: When will my results be ready? How will I get my results? What are my treatment options? What other tests do I need? What are my next steps? Summary You may have a thyroid-stimulating hormone (TSH) test if you have possible symptoms of abnormal thyroid hormone levels. The thyroid is a gland in the lower front of the neck. It makes hormones that affect many body parts and systems. The pituitary gland is located just below the brain, behind the eyes and nasal passages. It helps maintain thyroid hormone levels and thyroid gland function. This test   measures the amount of TSH in your blood. TSH is made by the pituitary gland. It may also be called thyrotropin. This information is not intended to replace advice given to you by your health care provider. Make sure you discuss any questions you have with your  health care provider. Document Revised: 11/18/2021 Document Reviewed: 11/18/2021 Elsevier Patient Education  2023 Elsevier Inc.  

## 2022-03-16 NOTE — Progress Notes (Signed)
? ?  Subjective:  ? ? Patient ID: [name], female    DOB: [birth year], 46 y.o.   MRN: [id number] ? ?Today's visit was for Transitional Care Management. ? ?The patient was discharged from [facility] on 03/09/22 with a primary diagnosis of uti.  ? ?Contact with the patient and/or caregiver, by a clinical staff member, was made on 03/10/22 and was documented as a telephone encounter within the EMR. ? ?Through chart review and discussion with the patient I have determined that management of their condition is of low complexity.  ? ? ?She has had IV antibiotics and discharged home on 03/09/22. A week prior to that she had surgery for kidney stone and is actually scheduled for surgery on Monday 03/23/22 to have stents removed and possible kidney stone removal on right . She is still in some pain. Still has some blood in her urine. She is currently in pain which would cause her bloodpressur eto be elevated ? ?Review of labs her TSH was 0.382- ? ? ?Review of Systems  ?Constitutional:  Negative for diaphoresis.  ?Eyes:  Negative for pain.  ?Respiratory:  Negative for shortness of breath.   ?Cardiovascular:  Negative for chest pain, palpitations and leg swelling.  ?Gastrointestinal:  Negative for abdominal pain.  ?Endocrine: Negative for polydipsia.  ?Musculoskeletal:  Positive for back pain.  ?Skin:  Negative for rash.  ?Neurological:  Negative for dizziness, weakness and headaches.  ?Hematological:  Does not bruise/bleed easily.  ?All other systems reviewed and are negative. ? ?   ?Objective:  ? Physical Exam ?Vitals reviewed.  ?Constitutional:   ?   Appearance: Normal appearance.  ?Cardiovascular:  ?   Rate and Rhythm: Normal rate and regular rhythm.  ?   Heart sounds: Normal heart sounds.  ?Pulmonary:  ?   Effort: Pulmonary effort is normal.  ?   Breath sounds: Normal breath sounds.  ?Abdominal:  ?   Tenderness: There is right CVA tenderness and left CVA tenderness.  ?Skin: ?   General: Skin is warm.  ?Neurological:  ?    General: No focal deficit present.  ?   Mental Status: She is alert and oriented to person, place, and time.  ?Psychiatric:     ?   Mood and Affect: Mood normal.     ?   Behavior: Behavior normal.  ? ? ?BP (!) 162/101   Pulse 86   Temp 98.5 ?F (36.9 ?C) (Temporal)   Resp 20   Ht 5\' 6"  (1.676 m)   Wt (!) 311 lb (141.1 kg)   LMP 11/05/2015   SpO2 96%   BMI 50.20 kg/m?  ? ? ? ?   ?Assessment & Plan:  ?14/05/2015 [place] in today with chief complaint of Transitions Of Care ? ? ?1. Low TSH level ?Referral to endocrinology ?- Ambulatory referral to Endocrinology ? ?2. Kidney stone ?Keep appointment for surgery on monday ? ?3. Hospital discharge follow-up ?Hospital records reviewed ? ? ? ?The above assessment and management plan was discussed with the patient. The patient verbalized understanding of and has agreed to the management plan. Patient is aware to call the clinic if symptoms persist or worsen. Patient is aware when to return to the clinic for a follow-up visit. Patient educated on when it is appropriate to go to the emergency department.  ? ?Mary-Margaret Ruthell Rummage, FNP ? ? ? ?

## 2022-03-16 NOTE — Progress Notes (Signed)
? ?03/16/2022 ?11:06 AM  ? ?Nocole L Helgeson ?1976/08/02 ?[id number] ? ?Referring provider: Daryll Drown, NP ?[address] ?[place],  [place] [address] ? ?Followup nephrolithiasis ? ? ?HPI: ?[name] is a 46yo here for followup for nephrolithiasis. She underwent left ureteroscopic stone extraction and right stent placement 2 weeks ago. She has moderate bilateral flank pain with the stent in place. No worsening LUTS. No hematuria or dysuria. No other complaints today ? ? ?PMH: ?Past Medical History:  ?Diagnosis Date  ? Asthma   ? COPD (chronic obstructive pulmonary disease) (HCC)   ? sees PCP  ? Depression   ? GERD (gastroesophageal reflux disease)   ? History of kidney stones   ? Hypertension   ? pt. denies  never taken meds  ? Pneumonia   ? PONV (postoperative nausea and vomiting)   ? ? ?Surgical History: ?Past Surgical History:  ?Procedure Laterality Date  ? ABDOMINAL HYSTERECTOMY    ? CHOLECYSTECTOMY    ? CYSTOSCOPY W/ RETROGRADES  08/04/2011  ? Procedure: CYSTOSCOPY WITH RETROGRADE PYELOGRAM;  Surgeon: Ky Barban;  Location: [facility];  Service: Urology;  Laterality: Right;  Right Retrograde  ? CYSTOSCOPY WITH RETROGRADE PYELOGRAM, URETEROSCOPY AND STENT PLACEMENT Left 07/24/2021  ? Procedure: CYSTOSCOPY WITH RETROGRADE PYELOGRAM, URETEROSCOPY AND STENT PLACEMENT;  Surgeon: Malen Gauze, MD;  Location: [facility];  Service: Urology;  Laterality: Left;  ? CYSTOSCOPY WITH RETROGRADE PYELOGRAM, URETEROSCOPY AND STENT PLACEMENT Bilateral 03/02/2022  ? Procedure: CYSTOSCOPY WITH RETROGRADE PYELOGRAM, RIGHT URETEROSCOPY AND BILATERAL STENT PLACEMENT;  Surgeon: Malen Gauze, MD;  Location: [facility];  Service: Urology;  Laterality: Bilateral;  ? HOLMIUM LASER APPLICATION N/A 04/15/2018  ? Procedure: HOLMIUM LASER APPLICATION;  Surgeon: Crist Fat, MD;  Location: [facility];  Service: Urology;  Laterality: N/A;  ? HOLMIUM LASER APPLICATION Left 07/24/2021  ? Procedure: HOLMIUM LASER APPLICATION;   Surgeon: Malen Gauze, MD;  Location: [facility];  Service: Urology;  Laterality: Left;  ? HOLMIUM LASER APPLICATION Bilateral 03/02/2022  ? Procedure: HOLMIUM LASER APPLICATION;  Surgeon: Malen Gauze, MD;  Location: [facility];  Service: Urology;  Laterality: Bilateral;  ? LITHOTRIPSY    ? NEPHROLITHOTOMY Right 04/15/2018  ? Procedure: RIGHT NEPHROLITHOTOMY PERCUTANEOUS WITH ACCESS;  Surgeon: Crist Fat, MD;  Location: [facility];  Service: Urology;  Laterality: Right;  ? NEPHROLITHOTOMY Right 12/29/2018  ? Procedure: NEPHROLITHOTOMY PERCUTANEOUS;  Surgeon: Crist Fat, MD;  Location: [facility];  Service: Urology;  Laterality: Right;  ? PERCUTANEOUS NEPHROLITHOTRIPSY  2012  ? both sides kidney   ? STONE EXTRACTION WITH BASKET Bilateral 03/02/2022  ? Procedure: STONE EXTRACTION WITH BASKET;  Surgeon: Malen Gauze, MD;  Location: [facility];  Service: Urology;  Laterality: Bilateral;  ? TUBAL LIGATION    ? ? ?Home Medications:  ?Allergies as of 03/16/2022   ? ?   Reactions  ? Hydrocodone Nausea And Vomiting  ? Morphine And Related Hives, Other (See Comments)  ? Welps  ? Vicodin [hydrocodone-acetaminophen] Nausea And Vomiting  ? ?  ? ?  ?Medication List  ?  ? ?  ? Accurate as of March 16, 2022 11:06 AM. If you have any questions, ask your nurse or doctor.  ?  ?  ? ?  ? ?B-12 PO ?Take 1 tablet by mouth daily. ?  ?cetirizine 10 MG tablet ?Commonly known as: ZYRTEC ?Take 10 mg by mouth daily as needed for allergies. ?  ?cholecalciferol 25 MCG (1000 UNIT) tablet ?Commonly known  as: VITAMIN D3 ?Take 1,000 Units by mouth daily. ?  ?HYDROcodone-acetaminophen 5-325 MG tablet ?Commonly known as: NORCO/VICODIN ?Take 1-2 tablets by mouth every 4 (four) hours as needed for severe pain or moderate pain. ?  ?multivitamin with minerals Tabs tablet ?Take 1 tablet by mouth daily. ?  ?ondansetron 4 MG tablet ?Commonly known as: Zofran ?Take 1 tablet (4 mg total) by mouth every 8 (eight) hours as needed for nausea or  vomiting. ?  ? ?  ? ? ?Allergies:  ?Allergies  ?Allergen Reactions  ? Hydrocodone Nausea And Vomiting  ? Morphine And Related Hives and Other (See Comments)  ?  Welps  ? Vicodin [Hydrocodone-Acetaminophen] Nausea And Vomiting  ? ? ?Family History: ?Family History  ?Problem Relation Age of Onset  ? COPD Mother   ? COPD Father   ? Heart attack Father   ? ? ?Social History:  reports that she quit smoking about 13 months ago. Her smoking use included cigarettes. She has a 9.00 pack-year smoking history. She has never used smokeless tobacco. She reports that she does not drink alcohol and does not use drugs. ? ?ROS: ?All other review of systems were reviewed and are negative except what is noted above in HPI ? ?Physical Exam: ?BP (!) 146/80   Pulse 93   LMP 11/05/2015   ?Constitutional:  Alert and oriented, No acute distress. ?HEENT: [place] AT, moist mucus membranes.  Trachea midline, no masses. ?Cardiovascular: No clubbing, cyanosis, or edema. ?Respiratory: Normal respiratory effort, no increased work of breathing. ?GI: Abdomen is soft, nontender, nondistended, no abdominal masses ?GU: No CVA tenderness.  ?Lymph: No cervical or inguinal lymphadenopathy. ?Skin: No rashes, bruises or suspicious lesions. ?Neurologic: Grossly intact, no focal deficits, moving all 4 extremities. ?Psychiatric: Normal mood and affect. ? ?Laboratory Data: ?Lab Results  ?Component Value Date  ? WBC 9.5 03/08/2022  ? HGB 12.4 03/08/2022  ? HCT 38.5 03/08/2022  ? MCV 94.1 03/08/2022  ? PLT 254 03/08/2022  ? ? ?Lab Results  ?Component Value Date  ? CREATININE 0.82 03/08/2022  ? ? ?No results found for: PSA ? ?No results found for: TESTOSTERONE ? ?Lab Results  ?Component Value Date  ? HGBA1C  01/20/2008  ?  5.6 ?(NOTE)   The ADA recommends the following therapeutic goals for glycemic   control related to Hgb A1C measurement:   Goal of Therapy:   < 7.0% Hgb A1C   Action Suggested:  > 8.0% Hgb A1C   Ref:  Diabetes Care, 22, Suppl. 1, 1999   ? ? ?Urinalysis ?   ?Component Value Date/Time  ? COLORURINE ORANGE (A) 03/06/2022 1925  ? APPEARANCEUR CLEAR 03/06/2022 1925  ? APPEARANCEUR Clear 02/10/2022 0822  ? LABSPEC 1.025 03/06/2022 1925  ? PHURINE 6.5 03/06/2022 1925  ? GLUCOSEU NEGATIVE 03/06/2022 1925  ? HGBUR LARGE (A) 03/06/2022 1925  ? BILIRUBINUR SMALL (A) 03/06/2022 1925  ? BILIRUBINUR Negative 02/10/2022 0822  ? Lavenia Atlas NEGATIVE 03/06/2022 1925  ? PROTEINUR >300 (A) 03/06/2022 1925  ? UROBILINOGEN 0.2 02/12/2015 1932  ? NITRITE POSITIVE (A) 03/06/2022 1925  ? LEUKOCYTESUR MODERATE (A) 03/06/2022 1925  ? ? ?Lab Results  ?Component Value Date  ? LABMICR See below: 02/10/2022  ? WBCUA 6-10 (A) 02/10/2022  ? LABEPIT 0-10 02/10/2022  ? MUCUS Present 02/10/2022  ? BACTERIA NONE SEEN 03/06/2022  ? ? ?Pertinent Imaging: ?CT 03/06/2022: Images reviewed and discussed with the patient ?Results for orders placed during the hospital encounter of 07/02/21 ? ?DG Abd 1 View ? ?  Narrative ?CLINICAL DATA:  Left-sided flank pain. ? ?EXAM: ?ABDOMEN - 1 VIEW ? ?COMPARISON:  Ultrasound renal 07/02/2021, CT renal 10/10/2019 ? ?FINDINGS: ?Right upper quadrant surgical clips. The bowel gas pattern is ?normal. No radio-opaque calculi or other significant radiographic ?abnormality are seen. ? ?IMPRESSION: ?Negative radiograph. ? ?These results and ultrasound renal results from same day will be ?called to the ordering clinician or representative by the ?Printmakeradiologist Assistant, and communication documented in the PACS or ?Clario Dashboard. ? ? ?Electronically Signed ?By: Tish FredericksonMorgane  Naveau M.D. ?On: 07/03/2021 21:41 ? ?No results found for this or any previous visit. ? ?No results found for this or any previous visit. ? ?No results found for this or any previous visit. ? ?Results for orders placed during the hospital encounter of 11/11/21 ? ?Ultrasound renal complete ? ?Narrative ?CLINICAL DATA:  Nephrolithiasis follow-up. ? ?EXAM: ?RENAL / URINARY TRACT ULTRASOUND  COMPLETE ? ?COMPARISON:  August 28, 2021 ? ?FINDINGS: ?Right Kidney: ? ?Renal measurements: 12.9 cm x 5.3 cm x 6.6 cm = volume: 235.72 mL. ?Echogenicity within normal limits. A 9 mm shadowing echogenic renal ?stone is seen within the lowe

## 2022-03-17 ENCOUNTER — Encounter: Payer: Self-pay | Admitting: Urology

## 2022-03-17 ENCOUNTER — Telehealth: Payer: Self-pay

## 2022-03-17 LAB — URINALYSIS, ROUTINE W REFLEX MICROSCOPIC
Bilirubin, UA: NEGATIVE
Glucose, UA: NEGATIVE
Ketones, UA: NEGATIVE
Nitrite, UA: NEGATIVE
Specific Gravity, UA: 1.025 (ref 1.005–1.030)
Urobilinogen, Ur: 0.2 mg/dL (ref 0.2–1.0)
pH, UA: 6 (ref 5.0–7.5)

## 2022-03-17 LAB — MICROSCOPIC EXAMINATION
RBC, Urine: >30 /hpf — AB (ref 0–2)
Renal Epithel, UA: NONE SEEN /hpf

## 2022-03-17 NOTE — Patient Instructions (Signed)
Ureteroscopy Ureteroscopy is a procedure to check for and treat problems inside part of the urinary tract. In this procedure, a thin, flexible tube with a light at the end (ureteroscope) is used to look at the inside of the kidneys and the ureters. The ureters are the tubes that carry urine from the kidneys to the bladder. The ureteroscope is inserted into one or both of the ureters. You may need this procedure if you have frequent urinary tract infections (UTIs), blood in your urine, or a stone in one of your ureters. A ureteroscopy can be done: To find the cause of urine blockage in a ureter and to evaluate other abnormalities inside the ureters or kidneys. To remove stones. To remove or treat growths of tissue (polyps), abnormal tissue, and some types of tumors. To remove a tissue sample and check it for disease under a microscope (biopsy). Tell a health care provider about: Any allergies you have. All medicines you are taking, including vitamins, herbs, eye drops, creams, and over-the-counter medicines. Any problems you or family members have had with anesthetic medicines. Any blood disorders you have. Any surgeries you have had. Any medical conditions you have. Whether you are pregnant or may be pregnant. What are the risks? Generally, this is a safe procedure. However, problems may occur, including: Bleeding. Infection. Allergic reactions to medicines. Scarring that narrows the ureter (stricture). Creating a hole in the ureter (perforation). What happens before the procedure? Staying hydrated Follow instructions from your health care provider about hydration, which may include: Up to 2 hours before the procedure - you may continue to drink clear liquids, such as water, clear fruit juice, black coffee, and plain tea.  Eating and drinking restrictions Follow instructions from your health care provider about eating and drinking, which may include: 8 hours before the procedure - stop  eating heavy meals or foods, such as meat, fried foods, or fatty foods. 6 hours before the procedure - stop eating light meals or foods, such as toast or cereal. 6 hours before the procedure - stop drinking milk or drinks that contain milk. 2 hours before the procedure - stop drinking clear liquids. Medicines Ask your health care provider about: Changing or stopping your regular medicines. This is especially important if you are taking diabetes medicines or blood thinners. Taking medicines such as aspirin and ibuprofen. These medicines can thin your blood. Do not take these medicines unless your health care provider tells you to take them. Taking over-the-counter medicines, vitamins, herbs, and supplements. General instructions Do not use any products that contain nicotine or tobacco for at least 4 weeks before the procedure. These products include cigarettes, e-cigarettes, and chewing tobacco. If you need help quitting, ask your health care provider. You may have a urine sample taken to check for infection. Plan to have someone take you home from the hospital or clinic. If you will be going home right after the procedure, plan to have someone with you for 24 hours. Ask your health care provider what steps will be taken to help prevent infection. These may include: Washing skin with a germ-killing soap. Receiving antibiotic medicine. What happens during the procedure?  An IV will be inserted into one of your veins. You will be given one or more of the following: A medicine to help you relax (sedative). A medicine to make you fall asleep (general anesthetic). A medicine that is injected into your spine to numb the area below and slightly above the injection site (spinal anesthetic). The   part of your body that drains urine from your bladder (urethra) will be cleaned with a germ-killing solution. The ureteroscope will be passed through your urethra into your bladder. A salt-water solution will  be sent through the ureteroscope to fill your bladder. This will help the health care provider see the openings of your ureters more clearly. The ureteroscope will be passed into your ureter. If a growth is found, a biopsy may be done. If a stone is found, it may be removed through the ureteroscope, or the stone may be broken up using a laser, shock waves, or electrical energy. In some cases, if the ureter is too small, a tube may be inserted that keeps the ureter open (ureteral stent). The stent may be left in place for 1 or 2 weeks to keep the ureter open, and then the ureteroscopy procedure will be done. The scope will be removed, and your bladder will be emptied. The procedure may vary among health care providers and hospitals. What can I expect after the procedure? After your procedure, it is common to have: Your blood pressure, heart rate, breathing rate, and blood oxygen level monitored until you leave the hospital or clinic. A burning sensation when you urinate. You may be asked to urinate. Blood in your urine. Mild discomfort in your bladder area or kidney area when urinating. A need to urinate more often or urgently. Follow these instructions at home: Medicines Take over-the-counter and prescription medicines only as told by your health care provider. If you were prescribed an antibiotic medicine, take it as told by your health care provider. Do not stop taking the antibiotic even if you start to feel better. General instructions  If you were given a sedative during the procedure, it can affect you for several hours. Do not drive or operate machinery until your health care provider says that it is safe. To relieve burning, take a warm bath or hold a warm washcloth over your groin. Drink enough fluid to keep your urine pale yellow. Drink two 8-ounce (237 mL) glasses of water every hour for the first 2 hours after you get home. Continue to drink water often at home. You can eat what  you normally do. Keep all follow-up visits as told by your health care provider. This is important. If you had a ureteral stent placed, ask your health care provider when you need to return to have it removed. Contact a health care provider if you have: Chills or a fever. Burning pain for longer than 24 hours after the procedure. Blood in your urine for longer than 24 hours after the procedure. Get help right away if you have: Large amounts of blood in your urine. Blood clots in your urine. Severe pain. Chest pain or trouble breathing. The feeling of a full bladder and you are unable to urinate. These symptoms may represent a serious problem that is an emergency. Do not wait to see if the symptoms will go away. Get medical help right away. Call your local emergency services (911 in the [place] Summary Ureteroscopy is a procedure to check for and treat problems inside part of the urinary tract. In this procedure, a thin, flexible tube with a light at the end (ureteroscope) is used to look at the inside of the kidneys and the ureters. You may need this procedure if you have frequent urinary tract infections (UTIs), blood in your urine, or a stone in a ureter. This information is not intended to replace advice given to   you by your health care provider. Make sure you discuss any questions you have with your health care provider. Document Revised: 10/16/2021 Document Reviewed: 08/23/2019 Elsevier Patient Education  2023 [employer]  

## 2022-03-17 NOTE — Telephone Encounter (Signed)
Surgical posting sheet received for ? ?Cystoscopy right retrograde right ureteroscopy with laser right ureteral stent exchange. ? ?I spoke with [name]. We have discussed possible surgery dates and 03/23/2022 was agreed upon by all parties. Patient given information about surgery date, what to expect pre-operatively and post operatively.  ?  ?We discussed that a pre-op nurse will be calling to set up the pre-op visit that will take place prior to surgery. Informed patient that our office will communicate any additional care to be provided after surgery.  ?  ?Patients questions or concerns were discussed during our call. Advised to call our office should there be any additional information, questions or concerns that arise. Patient verbalized understanding.   ?

## 2022-03-18 ENCOUNTER — Telehealth: Payer: Self-pay

## 2022-03-18 DIAGNOSIS — N200 Calculus of kidney: Secondary | ICD-10-CM

## 2022-03-18 NOTE — Telephone Encounter (Signed)
Patient called office to ask if she can come by tomorrow to leave urine for culture. ? ?Reviewed with Dr. Ronne Binning, patient can come tomorrow for urine culture.  ?

## 2022-03-19 ENCOUNTER — Other Ambulatory Visit: Payer: 59

## 2022-03-19 ENCOUNTER — Encounter (HOSPITAL_COMMUNITY)
Admission: RE | Admit: 2022-03-19 | Discharge: 2022-03-19 | Disposition: A | Discharge status: Home / Self Care | Payer: 59 | Source: Ambulatory Visit | Attending: Urology | Admitting: Urology

## 2022-03-20 ENCOUNTER — Emergency Department (HOSPITAL_COMMUNITY)
Admission: EM | Admit: 2022-03-20 | Discharge: 2022-03-20 | Disposition: A | Discharge status: Home / Self Care | Payer: 59 | Attending: Emergency Medicine | Admitting: Emergency Medicine

## 2022-03-20 ENCOUNTER — Emergency Department (HOSPITAL_COMMUNITY): Payer: 59

## 2022-03-20 ENCOUNTER — Other Ambulatory Visit: Payer: Self-pay

## 2022-03-20 ENCOUNTER — Encounter (HOSPITAL_COMMUNITY): Payer: Self-pay

## 2022-03-20 DIAGNOSIS — N1330 Unspecified hydronephrosis: Secondary | ICD-10-CM | POA: Diagnosis not present

## 2022-03-20 DIAGNOSIS — N200 Calculus of kidney: Secondary | ICD-10-CM | POA: Diagnosis not present

## 2022-03-20 DIAGNOSIS — R300 Dysuria: Secondary | ICD-10-CM

## 2022-03-20 DIAGNOSIS — Z466 Encounter for fitting and adjustment of urinary device: Secondary | ICD-10-CM | POA: Diagnosis not present

## 2022-03-20 DIAGNOSIS — R6883 Chills (without fever): Secondary | ICD-10-CM | POA: Diagnosis not present

## 2022-03-20 DIAGNOSIS — R350 Frequency of micturition: Secondary | ICD-10-CM | POA: Diagnosis not present

## 2022-03-20 LAB — URINALYSIS, ROUTINE W REFLEX MICROSCOPIC
Bacteria, UA: NONE SEEN
Bilirubin Urine: NEGATIVE
Glucose, UA: 50 mg/dL — AB
Ketones, ur: NEGATIVE mg/dL
Nitrite: NEGATIVE
Protein, ur: 100 mg/dL — AB
RBC / HPF: >50 RBC/hpf — ABNORMAL HIGH (ref 0–5)
Specific Gravity, Urine: 1.015 (ref 1.005–1.030)
WBC, UA: >50 WBC/hpf — ABNORMAL HIGH (ref 0–5)
pH: 8 (ref 5.0–8.0)

## 2022-03-20 LAB — CBC WITH DIFFERENTIAL/PLATELET
Abs Immature Granulocytes: 0.07 10*3/uL (ref 0.00–0.07)
Basophils Absolute: 0 10*3/uL (ref 0.0–0.1)
Basophils Relative: 0 %
Eosinophils Absolute: 0.1 10*3/uL (ref 0.0–0.5)
Eosinophils Relative: 1 %
HCT: 40.2 % (ref 36.0–46.0)
Hemoglobin: 13 g/dL (ref 12.0–15.0)
Immature Granulocytes: 1 %
Lymphocytes Relative: 29 %
Lymphs Abs: 3 10*3/uL (ref 0.7–4.0)
MCH: 30 pg (ref 26.0–34.0)
MCHC: 32.3 g/dL (ref 30.0–36.0)
MCV: 92.8 fL (ref 80.0–100.0)
Monocytes Absolute: 0.7 10*3/uL (ref 0.1–1.0)
Monocytes Relative: 6 %
Neutro Abs: 6.5 10*3/uL (ref 1.7–7.7)
Neutrophils Relative %: 63 %
Platelets: 300 10*3/uL (ref 150–400)
RBC: 4.33 MIL/uL (ref 3.87–5.11)
RDW: 12.9 % (ref 11.5–15.5)
WBC: 10.4 10*3/uL (ref 4.0–10.5)
nRBC: 0 % (ref 0.0–0.2)

## 2022-03-20 LAB — BASIC METABOLIC PANEL
Anion gap: 8 (ref 5–15)
BUN: 15 mg/dL (ref 6–20)
CO2: 26 mmol/L (ref 22–32)
Calcium: 8.9 mg/dL (ref 8.9–10.3)
Chloride: 104 mmol/L (ref 98–111)
Creatinine, Ser: 0.95 mg/dL (ref 0.44–1.00)
GFR, Estimated: >60 mL/min (ref >60)
Glucose, Bld: 122 mg/dL — ABNORMAL HIGH (ref 70–99)
Potassium: 3.7 mmol/L (ref 3.5–5.1)
Sodium: 138 mmol/L (ref 135–145)

## 2022-03-20 LAB — LACTIC ACID, PLASMA: Lactic Acid, Venous: 1.7 mmol/L (ref 0.5–1.9)

## 2022-03-20 MED ORDER — KETOROLAC TROMETHAMINE 30 MG/ML IJ SOLN
15.0000 mg | Freq: Once | INTRAMUSCULAR | Status: AC
  Administered 2022-03-20: 15 mg via INTRAVENOUS
  Filled 2022-03-20: qty 1

## 2022-03-20 MED ORDER — DIAZEPAM 5 MG PO TABS: 5.0000 mg | ORAL_TABLET | Freq: Two times a day (BID) | ORAL | 0 refills | Status: DC

## 2022-03-20 MED ORDER — OXYBUTYNIN CHLORIDE 5 MG PO TABS: 5.0000 mg | ORAL_TABLET | Freq: Three times a day (TID) | ORAL | 0 refills | Status: DC

## 2022-03-20 MED ORDER — OXYBUTYNIN CHLORIDE 5 MG PO TABS: 5.0000 mg | ORAL_TABLET | Freq: Three times a day (TID) | ORAL | Status: DC

## 2022-03-20 MED ORDER — FENTANYL CITRATE PF 50 MCG/ML IJ SOSY
50.0000 ug | PREFILLED_SYRINGE | Freq: Once | INTRAMUSCULAR | Status: AC
  Administered 2022-03-20: 50 ug via INTRAVENOUS
  Filled 2022-03-20: qty 1

## 2022-03-20 MED ORDER — SODIUM CHLORIDE 0.9 % IV BOLUS
500.0000 mL | Freq: Once | INTRAVENOUS | Status: AC
Start: 2022-03-20 — End: 2022-03-20
  Administered 2022-03-20: 500 mL via INTRAVENOUS

## 2022-03-20 MED ORDER — DIAZEPAM 5 MG PO TABS
5.0000 mg | ORAL_TABLET | Freq: Two times a day (BID) | ORAL | Status: DC | PRN
  Administered 2022-03-20: 5 mg via ORAL
  Filled 2022-03-20: qty 1

## 2022-03-20 MED ORDER — METOCLOPRAMIDE HCL 5 MG/ML IJ SOLN
10.0000 mg | Freq: Once | INTRAMUSCULAR | Status: AC
  Administered 2022-03-20: 10 mg via INTRAVENOUS
  Filled 2022-03-20: qty 2

## 2022-03-20 NOTE — ED Provider Notes (Signed)
?[facility] DEPART[facility] ?Provider Note ? ? ?CSN: [id number] ?Arrival date & time: 03/20/22  3086 ? ?  ? ?History ? ?Chief Complaint  ?Patient presents with  ? Urinary Frequency  ? ? ?[name] is a 46 y.o. female presenting for evaluation of return of urinary frequency which has been present for the past 48 hours and woke today with chills but no documented fever.  She describes constant bladder fullness sensation with frequent needing to urinate, stating she has urinated small amounts of urine every 10 to 15 minutes since her symptoms returned.  Past medical history is significant for kidney stones, she was recently discharged from the hospital due to urosepsis, prior had had bilateral urinary stents placed.  She is under the care of Dr. Ronne Binning and states she is scheduled to have the stents and a right kidney stone extraction in 3 days under his care.  She denies flank pain, denies nausea or vomiting, no documented fevers.  With her recent admission she was prescribed Keflex which she completed last weekend.  She was seen by Dr. Ronne Binning early this week but at that time she was asymptomatic.  She is found no alleviators for symptoms. ? ?The history is provided by the patient.  ? ?  ? ?Home Medications ?Prior to Admission medications   ?Medication Sig Start Date End Date Taking? Authorizing Provider  ?cetirizine (ZYRTEC) 10 MG tablet Take 10 mg by mouth daily as needed for allergies. 10/16/21  Yes [provider]  ?cholecalciferol (VITAMIN D3) 25 MCG (1000 UNIT) tablet Take 1,000 Units by mouth daily.   Yes [provider]  ?Cyanocobalamin (B-12 PO) Take 1 tablet by mouth daily.   Yes [provider]  ?diazepam (VALIUM) 5 MG tablet Take 1 tablet (5 mg total) by mouth 2 (two) times daily. 03/20/22  Yes Krislynn Gronau, Raynelle Fanning, PA-C  ?HYDROcodone-acetaminophen (NORCO/VICODIN) 5-325 MG tablet Take 1-2 tablets by mouth every 4 (four) hours as needed for severe pain or moderate pain.  03/09/22  Yes Azucena Fallen, MD  ?Multiple Vitamin (MULTIVITAMIN WITH MINERALS) TABS tablet Take 1 tablet by mouth daily.   Yes [provider]  ?ondansetron (ZOFRAN) 4 MG tablet Take 1 tablet (4 mg total) by mouth every 8 (eight) hours as needed for nausea or vomiting. 03/09/22 03/09/23 Yes Azucena Fallen, MD  ?oxybutynin (DITROPAN) 5 MG tablet Take 1 tablet (5 mg total) by mouth 3 (three) times daily. 03/20/22  Yes Burgess Amor, PA-C  ?   ? ?Allergies    ?Hydrocodone, Morphine and related, and Vicodin [hydrocodone-acetaminophen]   ? ?Review of Systems   ?Review of Systems  ?Constitutional:  Positive for chills. Negative for fever.  ?HENT:  Negative for congestion.   ?Eyes: Negative.   ?Respiratory:  Negative for chest tightness and shortness of breath.   ?Cardiovascular:  Negative for chest pain.  ?Gastrointestinal:  Positive for abdominal pain. Negative for nausea and vomiting.  ?Genitourinary:  Positive for dysuria, frequency and urgency. Negative for vaginal discharge.  ?Musculoskeletal:  Negative for arthralgias, joint swelling and neck pain.  ?Skin: Negative.  Negative for rash and wound.  ?Neurological:  Negative for dizziness, weakness, light-headedness, numbness and headaches.  ?Psychiatric/Behavioral: Negative.    ?All other systems reviewed and are negative. ? ?Physical Exam ?Updated Vital Signs ?BP 132/82 (BP Location: Left Arm)   Pulse 89   Temp 98 ?F (36.7 ?C) (Oral)   Resp 19   Ht 5\' 6"  (1.676 m)   Wt 129.3 kg  LMP 11/05/2015   SpO2 99%   BMI 46.00 kg/m?  ?Physical Exam ?Vitals and nursing note reviewed.  ?Constitutional:   ?   Appearance: She is well-developed.  ?HENT:  ?   Head: Normocephalic and atraumatic.  ?Eyes:  ?   Conjunctiva/sclera: Conjunctivae normal.  ?Cardiovascular:  ?   Rate and Rhythm: Normal rate and regular rhythm.  ?   Heart sounds: Normal heart sounds.  ?Pulmonary:  ?   Effort: Pulmonary effort is normal.  ?   Breath sounds: Normal breath sounds. No  wheezing.  ?Abdominal:  ?   General: Bowel sounds are normal.  ?   Palpations: Abdomen is soft.  ?   Tenderness: There is abdominal tenderness in the suprapubic area. There is no right CVA tenderness or left CVA tenderness.  ?Musculoskeletal:     ?   General: Normal range of motion.  ?   Cervical back: Normal range of motion.  ?Skin: ?   General: Skin is warm and dry.  ?Neurological:  ?   Mental Status: She is alert.  ? ? ?ED Results / Procedures / Treatments   ?Labs ?(all labs ordered are listed, but only abnormal results are displayed) ?Labs Reviewed  ?URINALYSIS, ROUTINE W REFLEX MICROSCOPIC - Abnormal; Notable for the following components:  ?    Result Value  ? Color, Urine AMBER (*)   ? APPearance CLOUDY (*)   ? Glucose, UA 50 (*)   ? Hgb urine dipstick LARGE (*)   ? Protein, ur 100 (*)   ? Leukocytes,Ua MODERATE (*)   ? RBC / HPF >50 (*)   ? WBC, UA >50 (*)   ? All other components within normal limits  ?BASIC METABOLIC PANEL - Abnormal; Notable for the following components:  ? Glucose, Bld 122 (*)   ? All other components within normal limits  ?CBC WITH DIFFERENTIAL/PLATELET  ?LACTIC ACID, PLASMA  ? ? ?EKG ?None ? ?Radiology ?CT Renal Stone Study ? ?Result Date: 03/20/2022 ?CLINICAL DATA:  46 year old female with history of nephrolithiasis and bilateral indwelling double-J nephroureteral stents presenting with lower abdominal pain, urinary frequency, and chills. EXAM: CT ABDOMEN AND PELVIS WITHOUT CONTRAST TECHNIQUE: Multidetector CT imaging of the abdomen and pelvis was performed following the standard protocol without IV contrast. RADIATION DOSE REDUCTION: This exam was performed according to the departmental dose-optimization program which includes automated exposure control, adjustment of the mA and/or kV according to patient size and/or use of iterative reconstruction technique. COMPARISON:  03/06/2022 FINDINGS: Lower chest: No acute abnormality. Hepatobiliary: Marked diffuse decreased attenuation of the  hepatic parenchyma. No evidence of hepatic masses. Status post cholecystectomy. No intra or extrahepatic biliary duct dilation. Pancreas: Unremarkable. No pancreatic ductal dilatation or surrounding inflammatory changes. Spleen: Normal in size without focal abnormality. Adrenals/Urinary Tract: The adrenal glands are no in size morphology bilaterally. Bilateral indwelling double-J nephroureteral stents are well positioned, unchanged. There is unchanged mild bilateral hydronephrosis. No evidence of hydroureter. Multifocal punctate bilateral nephrolithiasis, unchanged. The urinary bladder is decompressed. Stomach/Bowel: Similar appearing small hiatal hernia. Stomach is otherwise within normal limits. Appendix appears normal. No evidence of bowel wall thickening, distention, or inflammatory changes. Vascular/Lymphatic: No significant vascular findings are present. No enlarged abdominal or pelvic lymph nodes. Reproductive: Status post hysterectomy. No adnexal masses. Other: No abdominal wall hernia or abnormality. No abdominopelvic ascites. Musculoskeletal: No acute or significant osseous findings. IMPRESSION: 1. No acute abdominopelvic abnormality. 2. Unchanged mild bilateral hydronephrosis with unchanged position of indwelling bilateral double-J nephroureteral stents. 3. Multifocal  punctate bilateral nephrolithiasis, unchanged. 4. Severe hepatic steatosis. 5. Small hiatal hernia. Marliss Coots, MD [facility] Radiology Electronically Signed   By: Marliss Coots M.D.   On: 03/20/2022 13:05   ? ?Procedures ?Procedures  ? ? ?Medications Ordered in ED ?Medications  ?fentaNYL (SUBLIMAZE) injection 50 mcg (50 mcg Intravenous Given 03/20/22 1109)  ?metoCLOPramide (REGLAN) injection 10 mg (10 mg Intravenous Given 03/20/22 1108)  ?sodium chloride 0.9 % bolus 500 mL (0 mLs Intravenous Stopped 03/20/22 1211)  ?ketorolac (TORADOL) 30 MG/ML injection 15 mg (15 mg Intravenous Given 03/20/22  1353)  ? ? ?ED Course/ Medical Decision Making/ A&P ?Clinical Course as of 03/21/22 0923  ?Fri Mar 20, 2022  ?1140 Bladder scan 239 cc post void. [JI]  ?  ?Clinical Course User Index ?[JI] Burgess Amor, PA-C  ? ?

## 2022-03-20 NOTE — Discharge Instructions (Signed)
You have been prescribed Valium to help you relax your bladder as we suspect at least part of your pain is due to bladder spasm.  Do not drive within 4 to 6 hours of taking this medication as it will make you drowsy.  There is another medication that Dr. Ronne Binning wants you to pick up at his office before you go home today.  Return for reevaluation over the weekend if your pain becomes severe, you develop fevers or any new or worsening symptoms.  However, ideally if you feel you need Dr. Ronne Binning services earlier than Monday I might suggest you go to [facility] long hospital in [place] as this is the primary urology center for procedures. ?

## 2022-03-20 NOTE — ED Triage Notes (Signed)
Patient complaining of urinary frequency, chills, lower abdomen pain for the past two days. States that she has recently been admitted for sepsis secondary to stent placement for kidney stones. Stents are still in place and were scheduled to be removed Monday.  ?

## 2022-03-20 NOTE — ED Notes (Signed)
Bladder Scan completed, ?

## 2022-03-23 ENCOUNTER — Encounter (HOSPITAL_COMMUNITY)
Admission: RE | Disposition: A | Discharge status: Home / Self Care | Payer: Self-pay | Source: Ambulatory Visit | Attending: Urology

## 2022-03-23 ENCOUNTER — Ambulatory Visit (HOSPITAL_COMMUNITY): Payer: 59 | Admitting: Anesthesiology

## 2022-03-23 ENCOUNTER — Ambulatory Visit (HOSPITAL_COMMUNITY): Payer: 59

## 2022-03-23 ENCOUNTER — Ambulatory Visit (HOSPITAL_COMMUNITY)
Admission: RE | Admit: 2022-03-23 | Discharge: 2022-03-23 | Disposition: A | Discharge status: Home / Self Care | Payer: 59 | Source: Ambulatory Visit | Attending: Urology | Admitting: Urology

## 2022-03-23 ENCOUNTER — Ambulatory Visit (HOSPITAL_BASED_OUTPATIENT_CLINIC_OR_DEPARTMENT_OTHER): Payer: 59 | Admitting: Anesthesiology

## 2022-03-23 ENCOUNTER — Encounter (HOSPITAL_COMMUNITY): Payer: Self-pay | Admitting: Urology

## 2022-03-23 DIAGNOSIS — K219 Gastro-esophageal reflux disease without esophagitis: Secondary | ICD-10-CM | POA: Insufficient documentation

## 2022-03-23 DIAGNOSIS — Z6841 Body Mass Index (BMI) 40.0 and over, adult: Secondary | ICD-10-CM

## 2022-03-23 DIAGNOSIS — J449 Chronic obstructive pulmonary disease, unspecified: Secondary | ICD-10-CM | POA: Insufficient documentation

## 2022-03-23 DIAGNOSIS — N200 Calculus of kidney: Secondary | ICD-10-CM | POA: Diagnosis not present

## 2022-03-23 DIAGNOSIS — I10 Essential (primary) hypertension: Secondary | ICD-10-CM | POA: Insufficient documentation

## 2022-03-23 DIAGNOSIS — Z87891 Personal history of nicotine dependence: Secondary | ICD-10-CM | POA: Insufficient documentation

## 2022-03-23 DIAGNOSIS — N1330 Unspecified hydronephrosis: Secondary | ICD-10-CM | POA: Diagnosis not present

## 2022-03-23 DIAGNOSIS — E669 Obesity, unspecified: Secondary | ICD-10-CM

## 2022-03-23 DIAGNOSIS — N201 Calculus of ureter: Secondary | ICD-10-CM | POA: Diagnosis not present

## 2022-03-23 DIAGNOSIS — R69 Illness, unspecified: Secondary | ICD-10-CM | POA: Diagnosis not present

## 2022-03-23 DIAGNOSIS — F418 Other specified anxiety disorders: Secondary | ICD-10-CM

## 2022-03-23 HISTORY — PX: STONE EXTRACTION WITH BASKET: SHX5318

## 2022-03-23 HISTORY — PX: CYSTOSCOPY WITH RETROGRADE PYELOGRAM, URETEROSCOPY AND STENT PLACEMENT: SHX5789

## 2022-03-23 SURGERY — CYSTOURETEROSCOPY, WITH RETROGRADE PYELOGRAM AND STENT INSERTION
Anesthesia: General | Site: Ureter | Laterality: Bilateral

## 2022-03-23 MED ORDER — ONDANSETRON HCL 4 MG/2ML IJ SOLN
INTRAMUSCULAR | Status: AC
  Filled 2022-03-23: qty 2

## 2022-03-23 MED ORDER — ROCURONIUM BROMIDE 10 MG/ML (PF) SYRINGE
PREFILLED_SYRINGE | INTRAVENOUS | Status: AC
  Filled 2022-03-23: qty 10

## 2022-03-23 MED ORDER — OXYCODONE-ACETAMINOPHEN 5-325 MG PO TABS: 1.0000 | ORAL_TABLET | ORAL | 0 refills | Status: DC | PRN

## 2022-03-23 MED ORDER — ONDANSETRON HCL 4 MG/2ML IJ SOLN
4.0000 mg | Freq: Once | INTRAMUSCULAR | Status: DC
Start: 2022-03-23 — End: 2022-03-23

## 2022-03-23 MED ORDER — PROPOFOL 10 MG/ML IV BOLUS
INTRAVENOUS | Status: DC | PRN
  Administered 2022-03-23: 200 mg via INTRAVENOUS

## 2022-03-23 MED ORDER — ORAL CARE MOUTH RINSE: 15.0000 mL | Freq: Once | OROMUCOSAL | Status: AC

## 2022-03-23 MED ORDER — LIDOCAINE 2% (20 MG/ML) 5 ML SYRINGE
INTRAMUSCULAR | Status: DC | PRN
Start: 2022-03-23 — End: 2022-03-23
  Administered 2022-03-23: 100 mg via INTRAVENOUS

## 2022-03-23 MED ORDER — HYDROMORPHONE HCL 1 MG/ML IJ SOLN: 0.2500 mg | INTRAMUSCULAR | Status: DC | PRN

## 2022-03-23 MED ORDER — ONDANSETRON HCL 4 MG/2ML IJ SOLN
INTRAMUSCULAR | Status: DC | PRN
  Administered 2022-03-23: 4 mg via INTRAVENOUS

## 2022-03-23 MED ORDER — MIDAZOLAM HCL 5 MG/5ML IJ SOLN
INTRAMUSCULAR | Status: DC | PRN
  Administered 2022-03-23: 2 mg via INTRAVENOUS

## 2022-03-23 MED ORDER — CEFAZOLIN IN SODIUM CHLORIDE 3-0.9 GM/100ML-% IV SOLN
3.0000 g | INTRAVENOUS | Status: AC
  Administered 2022-03-23: 3 g via INTRAVENOUS
  Filled 2022-03-23: qty 100

## 2022-03-23 MED ORDER — SUGAMMADEX SODIUM 200 MG/2ML IV SOLN
INTRAVENOUS | Status: DC | PRN
  Administered 2022-03-23: 400 mg via INTRAVENOUS

## 2022-03-23 MED ORDER — MEPERIDINE HCL 50 MG/ML IJ SOLN: 6.2500 mg | INTRAMUSCULAR | Status: DC | PRN

## 2022-03-23 MED ORDER — DEXAMETHASONE SODIUM PHOSPHATE 10 MG/ML IJ SOLN
INTRAMUSCULAR | Status: AC
  Filled 2022-03-23: qty 1

## 2022-03-23 MED ORDER — DEXAMETHASONE SODIUM PHOSPHATE 10 MG/ML IJ SOLN
INTRAMUSCULAR | Status: DC | PRN
  Administered 2022-03-23: 10 mg via INTRAVENOUS

## 2022-03-23 MED ORDER — LACTATED RINGERS IV SOLN
INTRAVENOUS | Status: DC
  Administered 2022-03-23: 1000 mL via INTRAVENOUS

## 2022-03-23 MED ORDER — ROCURONIUM BROMIDE 10 MG/ML (PF) SYRINGE
PREFILLED_SYRINGE | INTRAVENOUS | Status: DC | PRN
Start: 2022-03-23 — End: 2022-03-23
  Administered 2022-03-23: 60 mg via INTRAVENOUS

## 2022-03-23 MED ORDER — PROPOFOL 10 MG/ML IV BOLUS
INTRAVENOUS | Status: AC
  Filled 2022-03-23: qty 20

## 2022-03-23 MED ORDER — ONDANSETRON HCL 4 MG/2ML IJ SOLN
INTRAMUSCULAR | Status: AC
  Administered 2022-03-23: 4 mg
  Filled 2022-03-23: qty 2

## 2022-03-23 MED ORDER — CHLORHEXIDINE GLUCONATE 0.12 % MT SOLN
15.0000 mL | Freq: Once | OROMUCOSAL | Status: AC
  Administered 2022-03-23: 15 mL via OROMUCOSAL

## 2022-03-23 MED ORDER — WATER FOR IRRIGATION, STERILE IR SOLN
Status: DC | PRN
Start: 2022-03-23 — End: 2022-03-23
  Administered 2022-03-23: 500 mL

## 2022-03-23 MED ORDER — SCOPOLAMINE 1 MG/3DAYS TD PT72
MEDICATED_PATCH | TRANSDERMAL | Status: AC
  Administered 2022-03-23: 1.5 mg
  Filled 2022-03-23: qty 1

## 2022-03-23 MED ORDER — SCOPOLAMINE 1 MG/3DAYS TD PT72: 1.0000 | MEDICATED_PATCH | Freq: Once | TRANSDERMAL | Status: DC

## 2022-03-23 MED ORDER — SODIUM CHLORIDE 0.9 % IR SOLN
Status: DC | PRN
Start: 2022-03-23 — End: 2022-03-23
  Administered 2022-03-23 (×2): 3000 mL

## 2022-03-23 MED ORDER — LIDOCAINE HCL (PF) 2 % IJ SOLN
INTRAMUSCULAR | Status: AC
  Filled 2022-03-23: qty 5

## 2022-03-23 MED ORDER — SUGAMMADEX SODIUM 500 MG/5ML IV SOLN
INTRAVENOUS | Status: AC
  Filled 2022-03-23: qty 5

## 2022-03-23 MED ORDER — DIATRIZOATE MEGLUMINE 30 % UR SOLN
URETHRAL | Status: AC
  Filled 2022-03-23: qty 100

## 2022-03-23 MED ORDER — FENTANYL CITRATE (PF) 100 MCG/2ML IJ SOLN
INTRAMUSCULAR | Status: AC
  Filled 2022-03-23: qty 2

## 2022-03-23 MED ORDER — DIATRIZOATE MEGLUMINE 30 % UR SOLN
URETHRAL | Status: DC | PRN
  Administered 2022-03-23: 8 mL via URETHRAL

## 2022-03-23 MED ORDER — MIDAZOLAM HCL 2 MG/2ML IJ SOLN
INTRAMUSCULAR | Status: AC
  Filled 2022-03-23: qty 2

## 2022-03-23 MED ORDER — FENTANYL CITRATE (PF) 100 MCG/2ML IJ SOLN
INTRAMUSCULAR | Status: DC | PRN
Start: 2022-03-23 — End: 2022-03-23
  Administered 2022-03-23 (×2): 50 ug via INTRAVENOUS

## 2022-03-23 SURGICAL SUPPLY — 26 items
BAG DRAIN URO TABLE W/ADPT NS (BAG) ×3 IMPLANT
BAG HAMPER (MISCELLANEOUS) ×3 IMPLANT
CATH INTERMIT  6FR 70CM (CATHETERS) ×3 IMPLANT
CLOTH BEACON ORANGE TIMEOUT ST (SAFETY) ×3 IMPLANT
EXTRACTOR STONE NITINOL NGAGE (UROLOGICAL SUPPLIES) ×2 IMPLANT
GLOVE BIO SURGEON STRL SZ8 (GLOVE) ×3 IMPLANT
GLOVE BIOGEL PI IND STRL 7.0 (GLOVE) ×4 IMPLANT
GLOVE BIOGEL PI INDICATOR 7.0 (GLOVE) ×2
GLOVE SURG SS PI 6.5 STRL IVOR (GLOVE) ×2 IMPLANT
GOWN STRL REUS W/TWL LRG LVL3 (GOWN DISPOSABLE) ×3 IMPLANT
GOWN STRL REUS W/TWL XL LVL3 (GOWN DISPOSABLE) ×3 IMPLANT
GUIDEWIRE STR DUAL SENSOR (WIRE) ×3 IMPLANT
GUIDEWIRE STR ZIPWIRE 035X150 (MISCELLANEOUS) ×3 IMPLANT
IV NS IRRIG 3000ML ARTHROMATIC (IV SOLUTION) ×6 IMPLANT
KIT TURNOVER CYSTO (KITS) ×3 IMPLANT
MANIFOLD NEPTUNE II (INSTRUMENTS) ×3 IMPLANT
PACK CYSTO (CUSTOM PROCEDURE TRAY) ×3 IMPLANT
PAD ARMBOARD 7.5X6 YLW CONV (MISCELLANEOUS) ×3 IMPLANT
SHEATH URETERAL 12FRX35CM (MISCELLANEOUS) ×2 IMPLANT
STENT URET 6FRX26 CONTOUR (STENTS) IMPLANT
SYR 10ML LL (SYRINGE) ×3 IMPLANT
SYR CONTROL 10ML LL (SYRINGE) ×3 IMPLANT
TOWEL OR 17X26 4PK STRL BLUE (TOWEL DISPOSABLE) ×3 IMPLANT
TRACTIP FLEXIVA PULS ID 200XHI (Laser) IMPLANT
TRACTIP FLEXIVA PULSE ID 200 (Laser)
WATER STERILE IRR 500ML POUR (IV SOLUTION) ×3 IMPLANT

## 2022-03-23 NOTE — Op Note (Signed)
.  Preoperative diagnosis: Right ureteral stone ? ?Postoperative diagnosis: Same ? ?Procedure: 1 cystoscopy ?2.  Right retrograde pyelography ?3.  Intraoperative fluoroscopy, under one hour, with interpretation ?4.  Right ureteroscopic stone manipulation with basket extraction ?5.  bilateral ureteral stent removal ? ?Attending: [name] ? ?Anesthesia: General ? ?Estimated blood loss: None ? ?Drains: none ? ?Specimens: stone for analysis ? ?Antibiotics: ancef ? ?Findings: right proximal ureteral calculi. Right lower pole calculi. Numerous papillary tip calcifications. Mild hydronephrosis. No masses/lesions in the bladder. Ureteral orifices in normal anatomic location. ? ?Indications: Patient is a 46 year old female/female with a history of right renal stone and who underwent stent placement 2 weeks ago.  After discussing treatment options, she decided proceed with right ureteroscopic stone manipulation. ? ?Procedure in detail: The patient was brought to the operating room and a brief timeout was done to ensure correct patient, correct procedure, correct site.  General anesthesia was administered patient was placed in dorsal lithotomy position.  Her genitalia was then prepped and draped in usual sterile fashion.  A rigid 22 French cystoscope was passed in the urethra and the bladder.  Bladder was inspected free masses or lesions.  the ureteral orifices were in the normal orthotopic locations.  a 6 french ureteral catheter was then instilled into the right ureteral orifice.  a gentle retrograde was obtained and findings noted above.  Using a grasper the right ureteral stent was brought to the urethral meatus. we then placed a zip wire through the ureteral catheter and advanced up to the renal pelvis. We then removed the stent. Using a grasper the left ureteral stent was removed we then removed the cystoscope and cannulated the right ureteral orifice with a semirigid ureteroscope.  We encountered multiple 2-62mm  calculi in the ureter which were removed with an NGage basket. Once we reached the UPJ a sensor wire was advanced in to the renal pelvis. We then removed the ureteroscope and advanced am 12/14 x 35cm access sheath up to the renal pelvis. We then used the flexible ureteroscope to perform nephroscopy. We encountered the stone in the lower pole.    the pieces were then removed with a Ngage basket.    once all stone fragments were removed we then removed the access sheath under direct vision and noted no injury to the ureter. We elected not to leave a stent since this was an uncomplicated ureteroscopy. the bladder was then drained and this concluded the procedure which was well tolerated by patient. ? ?Complications: None ? ?Condition: Stable, extubated, transferred to PACU ? ?Plan: Patient is to be discharged home as to follow-up in one week ?

## 2022-03-23 NOTE — Anesthesia Postprocedure Evaluation (Signed)
Anesthesia Post Note ? ?Patient: [name] ? ?Procedure(s) Performed: CYSTOSCOPY WITH RETROGRADE PYELOGRAM, URETEROSCOPY AND STENT REMOVAL (Right: Ureter) ?STONE EXTRACTION WITH BASKET (Right: Ureter) ? ?Patient location during evaluation: Phase II ?Anesthesia Type: General ?Level of consciousness: awake and alert and oriented ?Pain management: pain level controlled ?Vital Signs Assessment: post-procedure vital signs reviewed and stable ?Respiratory status: spontaneous breathing, nonlabored ventilation and respiratory function stable ?Cardiovascular status: blood pressure returned to baseline and stable ?Postop Assessment: no apparent nausea or vomiting ?Anesthetic complications: no ? ? ?No notable events documented. ? ? ?Last Vitals:  ?Vitals:  ? 03/23/22 0930 03/23/22 0938  ?BP: (!) 131/91 (!) 153/84  ?Pulse: 86 96  ?Resp: 17 16  ?Temp:  36.7 ?C  ?SpO2: 94% 95%  ?  ?Last Pain:  ?Vitals:  ? 03/23/22 0938  ?TempSrc: Oral  ?PainSc: 0-No pain  ? ? ?  ?  ?  ?  ?  ?  ? ?Sumner Kirchman C Ermon Sagan ? ? ? ? ?

## 2022-03-23 NOTE — Interval H&P Note (Signed)
History and Physical Interval Note: ? ?03/23/2022 ?7:50 AM ? ?[name]  has presented today for surgery, with the diagnosis of right renal calculi.  The various methods of treatment have been discussed with the patient and family. After consideration of risks, benefits and other options for treatment, the patient has consented to  Procedure(s): ?CYSTOSCOPY WITH RETROGRADE PYELOGRAM, URETEROSCOPY AND STENT EXCHANGE (Right) ?HOLMIUM LASER APPLICATION (Right) as a surgical intervention.  The patient's history has been reviewed, patient examined, no change in status, stable for surgery.  I have reviewed the patient's chart and labs.  Questions were answered to the patient's satisfaction.   ? ? ?[name] ? ? ?

## 2022-03-23 NOTE — Anesthesia Procedure Notes (Signed)
Procedure Name: Intubation ?Date/Time: 03/23/2022 8:05 AM ?Performed by: Myna Bright, CRNA ?Pre-anesthesia Checklist: Patient identified, Emergency Drugs available, Suction available and Patient being monitored ?Patient Re-evaluated:Patient Re-evaluated prior to induction ?Oxygen Delivery Method: Circle system utilized ?Preoxygenation: Pre-oxygenation with 100% oxygen ?Induction Type: IV induction ?Ventilation: Mask ventilation without difficulty ?Laryngoscope Size: Mac and 3 ?Grade View: Grade I ?Tube type: Oral ?Tube size: 7.0 mm ?Number of attempts: 1 ?Airway Equipment and Method: Stylet ?Placement Confirmation: ETT inserted through vocal cords under direct vision, positive ETCO2 and breath sounds checked- equal and bilateral ?Secured at: 21 cm ?Tube secured with: Tape ?Dental Injury: Teeth and Oropharynx as per pre-operative assessment  ? ? ? ? ?

## 2022-03-23 NOTE — Transfer of Care (Signed)
Immediate Anesthesia Transfer of Care Note ? ?Patient: [name] ? ?Procedure(s) Performed: CYSTOSCOPY WITH RETROGRADE PYELOGRAM, URETEROSCOPY AND STENT REMOVAL (Right: Ureter) ?STONE EXTRACTION WITH BASKET (Right: Ureter) ? ?Patient Location: PACU ? ?Anesthesia Type:General ? ?Level of Consciousness: awake, alert , oriented and patient cooperative ? ?Airway & Oxygen Therapy: Patient Spontanous Breathing and Patient connected to nasal cannula oxygen ? ?Post-op Assessment: Report given to RN, Post -op Vital signs reviewed and stable and Patient moving all extremities ? ?Post vital signs: Reviewed and stable ? ?Last Vitals:  ?Vitals Value Taken Time  ?BP 138/72 03/23/22 0900  ?Temp 36.7 ?C 03/23/22 0859  ?Pulse 99 03/23/22 0906  ?Resp 24 03/23/22 0906  ?SpO2 97 % 03/23/22 0906  ?Vitals shown include unvalidated device data. ? ?Last Pain:  ?Vitals:  ? 03/23/22 0658  ?TempSrc: Oral  ?PainSc: 0-No pain  ?   ? ?Patients Stated Pain Goal: 5 (03/23/22 3825) ? ?Complications: No notable events documented. ?

## 2022-03-23 NOTE — Anesthesia Preprocedure Evaluation (Signed)
Anesthesia Evaluation  ?Patient identified by MRN, date of birth, ID band ?Patient awake ? ? ? ?Reviewed: ?Allergy & Precautions, NPO status , Patient's Chart, lab work & pertinent test results ? ?History of Anesthesia Complications ?(+) PONV and history of anesthetic complications ? ?Airway ?Mallampati: II ? ?TM Distance: >3 FB ?Neck ROM: Full ? ? ? Dental ? ?(+) Dental Advisory Given, Missing ?  ?Pulmonary ?asthma , pneumonia, COPD,  COPD inhaler, former smoker,  ?  ?Pulmonary exam normal ?breath sounds clear to auscultation ? ? ? ? ? ? Cardiovascular ?Exercise Tolerance: Good ?hypertension, Pt. on medications ?Normal cardiovascular exam ?Rhythm:Regular Rate:Normal ? ? ?  ?Neuro/Psych ?PSYCHIATRIC DISORDERS Anxiety Depression   ? GI/Hepatic ?GERD  Medicated and Controlled,  ?Endo/Other  ?Morbid obesity ? Renal/GU ?Renal disease  ? ?  ?Musculoskeletal ?negative musculoskeletal ROS ?(+)  ? Abdominal ?  ?Peds ? Hematology ?negative hematology ROS ?(+)   ?Anesthesia Other Findings ? ? Reproductive/Obstetrics ?negative OB ROS ? ?  ? ? ? ? ? ? ? ? ? ? ? ? ? ?  ?  ? ? ? ? ? ? ? ? ?Anesthesia Physical ? ?Anesthesia Plan ? ?ASA: 3 ? ?Anesthesia Plan: General  ? ?Post-op Pain Management: Dilaudid IV  ? ?Induction: Intravenous ? ?PONV Risk Score and Plan: 4 or greater and Ondansetron, Dexamethasone and Midazolam ? ?Airway Management Planned: Oral ETT ? ?Additional Equipment:  ? ?Intra-op Plan:  ? ?Post-operative Plan: Extubation in OR ? ?Informed Consent: I have reviewed the patients History and Physical, chart, labs and discussed the procedure including the risks, benefits and alternatives for the proposed anesthesia with the patient or authorized representative who has indicated his/her understanding and acceptance.  ? ? ? ? ? ?Plan Discussed with: CRNA and Surgeon ? ?Anesthesia Plan Comments:   ? ? ? ? ? ? ?Anesthesia Quick Evaluation ? ?

## 2022-03-24 ENCOUNTER — Encounter (HOSPITAL_COMMUNITY): Payer: Self-pay | Admitting: Urology

## 2022-04-01 ENCOUNTER — Ambulatory Visit: Payer: 59 | Admitting: Physician Assistant

## 2022-04-01 ENCOUNTER — Encounter: Payer: Self-pay | Admitting: Nurse Practitioner

## 2022-04-01 ENCOUNTER — Ambulatory Visit (INDEPENDENT_AMBULATORY_CARE_PROVIDER_SITE_OTHER): Payer: 59 | Admitting: Nurse Practitioner

## 2022-04-01 VITALS — BP 135/82 | HR 91 | Ht 65.5 in | Wt 310.0 lb

## 2022-04-01 DIAGNOSIS — R7989 Other specified abnormal findings of blood chemistry: Secondary | ICD-10-CM

## 2022-04-01 DIAGNOSIS — R635 Abnormal weight gain: Secondary | ICD-10-CM | POA: Diagnosis not present

## 2022-04-01 LAB — CALCULI, WITH PHOTOGRAPH (CLINICAL LAB)
Calcium Oxalate Dihydrate: 100 %
Weight Calculi: 5 mg

## 2022-04-01 NOTE — Progress Notes (Signed)
? ? ? 04/01/2022   ? ? ?Endocrinology Consult Note  ? ? ?Subjective:  ? ? Patient ID: [name], female    DOB: 02-05-1976, PCP Daryll Drown, NP. ? ? ?Past Medical History:  ?Diagnosis Date  ? Asthma   ? COPD (chronic obstructive pulmonary disease) (HCC)   ? sees PCP  ? Depression   ? GERD (gastroesophageal reflux disease)   ? History of kidney stones   ? Hypertension   ? pt. denies  never taken meds  ? Pneumonia   ? PONV (postoperative nausea and vomiting)   ? ? ?Past Surgical History:  ?Procedure Laterality Date  ? ABDOMINAL HYSTERECTOMY    ? CHOLECYSTECTOMY    ? CYSTOSCOPY W/ RETROGRADES  08/04/2011  ? Procedure: CYSTOSCOPY WITH RETROGRADE PYELOGRAM;  Surgeon: Ky Barban;  Location: [facility];  Service: Urology;  Laterality: Right;  Right Retrograde  ? CYSTOSCOPY WITH RETROGRADE PYELOGRAM, URETEROSCOPY AND STENT PLACEMENT Left 07/24/2021  ? Procedure: CYSTOSCOPY WITH RETROGRADE PYELOGRAM, URETEROSCOPY AND STENT PLACEMENT;  Surgeon: Malen Gauze, MD;  Location: [facility];  Service: Urology;  Laterality: Left;  ? CYSTOSCOPY WITH RETROGRADE PYELOGRAM, URETEROSCOPY AND STENT PLACEMENT Bilateral 03/02/2022  ? Procedure: CYSTOSCOPY WITH RETROGRADE PYELOGRAM, RIGHT URETEROSCOPY AND BILATERAL STENT PLACEMENT;  Surgeon: Malen Gauze, MD;  Location: [facility];  Service: Urology;  Laterality: Bilateral;  ? CYSTOSCOPY WITH RETROGRADE PYELOGRAM, URETEROSCOPY AND STENT PLACEMENT Bilateral 03/23/2022  ? Procedure: CYSTOSCOPY WITH RETROGRADE PYELOGRAM, URETEROSCOPY AND STENT REMOVAL;  Surgeon: Malen Gauze, MD;  Location: [facility];  Service: Urology;  Laterality: Bilateral;  ? HOLMIUM LASER APPLICATION N/A 04/15/2018  ? Procedure: HOLMIUM LASER APPLICATION;  Surgeon: Crist Fat, MD;  Location: [facility];  Service: Urology;  Laterality: N/A;  ? HOLMIUM LASER APPLICATION Left 07/24/2021  ? Procedure: HOLMIUM LASER APPLICATION;  Surgeon: Malen Gauze, MD;  Location: [facility];  Service: Urology;   Laterality: Left;  ? HOLMIUM LASER APPLICATION Bilateral 03/02/2022  ? Procedure: HOLMIUM LASER APPLICATION;  Surgeon: Malen Gauze, MD;  Location: [facility];  Service: Urology;  Laterality: Bilateral;  ? LITHOTRIPSY    ? NEPHROLITHOTOMY Right 04/15/2018  ? Procedure: RIGHT NEPHROLITHOTOMY PERCUTANEOUS WITH ACCESS;  Surgeon: Crist Fat, MD;  Location: [facility];  Service: Urology;  Laterality: Right;  ? NEPHROLITHOTOMY Right 12/29/2018  ? Procedure: NEPHROLITHOTOMY PERCUTANEOUS;  Surgeon: Crist Fat, MD;  Location: [facility];  Service: Urology;  Laterality: Right;  ? PERCUTANEOUS NEPHROLITHOTRIPSY  2012  ? both sides kidney   ? STONE EXTRACTION WITH BASKET Bilateral 03/02/2022  ? Procedure: STONE EXTRACTION WITH BASKET;  Surgeon: Malen Gauze, MD;  Location: [facility];  Service: Urology;  Laterality: Bilateral;  ? STONE EXTRACTION WITH BASKET Bilateral 03/23/2022  ? Procedure: STONE EXTRACTION WITH BASKET;  Surgeon: Malen Gauze, MD;  Location: [facility];  Service: Urology;  Laterality: Bilateral;  ? TUBAL LIGATION    ? ? ?Social History  ? ?Socioeconomic History  ? Marital status: Single  ?  Spouse name: Not on file  ? Number of children: Not on file  ? Years of education: Not on file  ? Highest education level: Not on file  ?Occupational History  ? Not on file  ?Tobacco Use  ? Smoking status: Former  ?  Packs/day: 0.50  ?  Years: 18.00  ?  Pack years: 9.00  ?  Types: Cigarettes  ?  Quit date: 01/21/2021  ?  Years since quitting: 1.1  ? Smokeless  tobacco: Never  ?Vaping Use  ? Vaping Use: Never used  ?Substance and Sexual Activity  ? Alcohol use: No  ? Drug use: No  ? Sexual activity: Yes  ?  Birth control/protection: Surgical  ?Other Topics Concern  ? Not on file  ?Social History Narrative  ? Not on file  ? ?Social Determinants of Health  ? ?Financial Resource Strain: Not on file  ?Food Insecurity: Not on file  ?Transportation Needs: Not on file  ?Physical Activity: Not on file  ?Stress: Not on  file  ?Social Connections: Not on file  ? ? ?Family History  ?Problem Relation Age of Onset  ? COPD Mother   ? Cancer Father   ? COPD Father   ? Heart attack Father   ? ? ?Outpatient Encounter Medications as of 04/01/2022  ?Medication Sig  ? cetirizine (ZYRTEC) 10 MG tablet Take 10 mg by mouth daily as needed for allergies.  ? cholecalciferol (VITAMIN D3) 25 MCG (1000 UNIT) tablet Take 1,000 Units by mouth daily.  ? Cyanocobalamin (B-12 PO) Take 1 tablet by mouth daily.  ? diazepam (VALIUM) 5 MG tablet Take 1 tablet (5 mg total) by mouth 2 (two) times daily.  ? Multiple Vitamin (MULTIVITAMIN WITH MINERALS) TABS tablet Take 1 tablet by mouth daily.  ? ondansetron (ZOFRAN) 4 MG tablet Take 1 tablet (4 mg total) by mouth every 8 (eight) hours as needed for nausea or vomiting.  ? oxybutynin (DITROPAN) 5 MG tablet Take 1 tablet (5 mg total) by mouth 3 (three) times daily.  ? oxyCODONE-acetaminophen (PERCOCET/ROXICET) 5-325 MG tablet Take 1 tablet by mouth every 4 (four) hours as needed for severe pain.  ? ?No facility-administered encounter medications on file as of 04/01/2022.  ? ? ?ALLERGIES: ?Allergies  ?Allergen Reactions  ? Hydrocodone Nausea And Vomiting  ? Morphine And Related Hives and Other (See Comments)  ?  Welps  ? Vicodin [Hydrocodone-Acetaminophen] Nausea And Vomiting  ? ? ?VACCINATION STATUS: ?Immunization History  ?Administered Date(s) Administered  ? Influenza,inj,Quad PF,6+ Mos 09/11/2014, 09/05/2015, 09/01/2016, 12/30/2018  ? ? ? ?HPI ? ?[name] is 46 y.o. female who presents today with a medical history as above. she is being seen in consultation for hyperthyroidism requested by Daryll DrownIjaola, Onyeje M, NP.  she has been dealing with symptoms of progressive weight gain, fatigue, kidney stones (multiple), and constipation, depression, heat intolerance, palpitations and tremors for about a year. These symptoms are progressively worsening and troubling to her.  her most recent thyroid labs revealed  suppressed TSH of 0.382 on 02/06/22. ? ?She does note intermittent hoarseness. ? ?she denies dysphagia, choking, shortness of breath. ?  ?she does have family history of thyroid dysfunction in her mothers side including some of her sisters and nieces, but denies family hx of thyroid cancer. she denies personal history of goiter. she is not on any anti-thyroid medications nor on any thyroid hormone supplements. Denies use of Biotin containing supplements other than a womens formulated MVI.  she is willing to proceed with appropriate work up and therapy for thyrotoxicosis. ? ? ?Review of systems ? ?Constitutional: + rapidly increasing body weight, current Body mass index is 50.8 kg/m?., + fatigue, + subjective hyperthermia, no subjective hypothermia ?Eyes: no blurry vision, no xerophthalmia ?ENT: no sore throat, no nodules palpated in throat, no dysphagia/odynophagia, + hoarseness ?Cardiovascular: no chest pain, no shortness of breath, + palpitations, no leg swelling ?Respiratory: no cough, no shortness of breath ?Gastrointestinal: no nausea/vomiting/diarrhea, + chronic constipation ?Musculoskeletal: no muscle/joint  aches ?Skin: no rashes, no hyperemia ?Neurological: + tremors, no numbness, no tingling, no dizziness ?Psychiatric: + depression, no anxiety ? ? ?Objective:  ?  ?BP 135/82   Pulse 91   Ht 5' 5.5" (1.664 m)   Wt (!) 310 lb (140.6 kg)   LMP 11/05/2015   SpO2 99%   BMI 50.80 kg/m?   ?Wt Readings from Last 3 Encounters:  ?04/01/22 (!) 310 lb (140.6 kg)  ?03/23/22 285 lb 0.9 oz (129.3 kg)  ?03/20/22 285 lb (129.3 kg)  ?  ? ?BP Readings from Last 3 Encounters:  ?04/01/22 135/82  ?03/23/22 (!) 153/84  ?03/20/22 132/82  ? ? ?                     ? ?Physical Exam- Limited ? ?Constitutional:  Body mass index is 50.8 kg/m?. , not in acute distress, normal state of mind, carries weight mostly in mid region, dorsocervical fat pad present ?Eyes:  EOMI, no exophthalmos ?Neck: Supple ?Thyroid: No gross  goiter ?Cardiovascular: RRR, no murmurs, rubs, or gallops, no edema ?Respiratory: Adequate breathing efforts, no crackles, rales, rhonchi, or wheezing ?Musculoskeletal: no gross deformities, strength intact in all four extremities

## 2022-04-01 NOTE — Patient Instructions (Signed)
Hyperthyroidism  Hyperthyroidism is when the thyroid gland is too active (overactive). The thyroid gland is a small gland located in the lower front part of the neck, just in front of the windpipe (trachea). This gland makes hormones that help control how the body uses food for energy (metabolism) as well as how the heart and brain function. These hormones also play a role in keeping your bones strong. When the thyroid is overactive, it produces too much of a hormone called thyroxine. What are the causes? This condition may be caused by: Graves' disease. This is a disorder in which the body's disease-fighting system (immune system) attacks the thyroid gland. This is the most common cause. Inflammation of the thyroid gland. A tumor in the thyroid gland. Use of certain medicines, including: Prescription thyroid hormone replacement. Herbal supplements that mimic thyroid hormones. Amiodarone therapy. Solid or fluid-filled lumps within your thyroid gland (thyroid nodules). Taking in a large amount of iodine from foods or medicines. What increases the risk? You are more likely to develop this condition if: You are female. You have a family history of thyroid conditions. You smoke tobacco. You use a medicine called lithium. You take medicines that affect the immune system (immunosuppressants). What are the signs or symptoms? Symptoms of this condition include: Nervousness. Inability to tolerate heat. Unexplained weight loss. Diarrhea. Change in the texture of hair or skin. Heart skipping beats or making extra beats. Rapid heart rate. Loss of menstruation. Shaky hands. Fatigue. Restlessness. Sleep problems. Enlarged thyroid gland or a lump in the thyroid (nodule). You may also have symptoms of Graves' disease, which may include: Protruding eyes. Dry eyes. Red or swollen eyes. Problems with vision. How is this diagnosed? This condition may be diagnosed based on: Your symptoms and  medical history. A physical exam. Blood tests. Thyroid ultrasound. This test involves using sound waves to produce images of the thyroid gland. A thyroid scan. A radioactive substance is injected into a vein, and images show how much iodine is present in the thyroid. Radioactive iodine uptake test (RAIU). A small amount of radioactive iodine is given by mouth to see how much iodine the thyroid absorbs after a certain amount of time. How is this treated? Treatment depends on the cause and severity of the condition. Treatment may include: Medicines to reduce the amount of thyroid hormone your body makes. Radioactive iodine treatment (radioiodine therapy). This involves swallowing a small dose of radioactive iodine, in capsule or liquid form, to kill thyroid cells. Surgery to remove part or all of your thyroid gland. You may need to take thyroid hormone replacement medicine for the rest of your life after thyroid surgery. Medicines to help manage your symptoms. Follow these instructions at home:  Take over-the-counter and prescription medicines only as told by your health care provider. Do not use any products that contain nicotine or tobacco, such as cigarettes and e-cigarettes. If you need help quitting, ask your health care provider. Follow any instructions from your health care provider about diet. You may be instructed to limit foods that contain iodine. Keep all follow-up visits as told by your health care provider. This is important. You will need to have blood tests regularly so that your health care provider can monitor your condition. Contact a health care provider if: Your symptoms do not get better with treatment. You have a fever. You are taking thyroid hormone replacement medicine and you: Have symptoms of depression. Feel like you are tired all the time. Gain weight. Get help   right away if: You have chest pain. You have decreased alertness or a change in your awareness. You  have abdominal pain. You feel dizzy. You have a rapid heartbeat. You have an irregular heartbeat. You have difficulty breathing. Summary The thyroid gland is a small gland located in the lower front part of the neck, just in front of the windpipe (trachea). Hyperthyroidism is when the thyroid gland is too active (overactive) and produces too much of a hormone called thyroxine. The most common cause is Graves' disease, a disorder in which your immune system attacks the thyroid gland. Hyperthyroidism can cause various symptoms, such as unexplained weight loss, nervousness, inability to tolerate heat, or changes in your heartbeat. Treatment may include medicine to reduce the amount of thyroid hormone your body makes, radioiodine therapy, surgery, or medicines to manage symptoms. This information is not intended to replace advice given to you by your health care provider. Make sure you discuss any questions you have with your health care provider. Document Revised: 11/28/2021 Document Reviewed: 08/01/2020 Elsevier Patient Education  2023 [employer]  

## 2022-04-02 DIAGNOSIS — R635 Abnormal weight gain: Secondary | ICD-10-CM | POA: Diagnosis not present

## 2022-04-02 DIAGNOSIS — R7989 Other specified abnormal findings of blood chemistry: Secondary | ICD-10-CM | POA: Diagnosis not present

## 2022-04-03 LAB — PTH, INTACT AND CALCIUM
Calcium: 9 mg/dL (ref 8.7–10.2)
PTH: 31 pg/mL (ref 15–65)

## 2022-04-03 LAB — T3, FREE: T3, Free: 3.4 pg/mL (ref 2.0–4.4)

## 2022-04-03 LAB — CORTISOL-AM, BLOOD: Cortisol - AM: 11.3 ug/dL (ref 6.2–19.4)

## 2022-04-03 LAB — T4, FREE: Free T4: 1.23 ng/dL (ref 0.82–1.77)

## 2022-04-03 LAB — THYROGLOBULIN ANTIBODY: Thyroglobulin Antibody: 3.1 IU/mL — ABNORMAL HIGH (ref 0.0–0.9)

## 2022-04-03 LAB — TSH: TSH: 0.723 u[IU]/mL (ref 0.450–4.500)

## 2022-04-03 LAB — THYROID PEROXIDASE ANTIBODY: Thyroperoxidase Ab SerPl-aCnc: 16 IU/mL (ref 0–34)

## 2022-04-09 ENCOUNTER — Ambulatory Visit: Payer: 59 | Admitting: Physician Assistant

## 2022-04-15 ENCOUNTER — Encounter: Payer: Self-pay | Admitting: Urology

## 2022-04-15 ENCOUNTER — Ambulatory Visit (INDEPENDENT_AMBULATORY_CARE_PROVIDER_SITE_OTHER): Payer: 59 | Admitting: Urology

## 2022-04-15 ENCOUNTER — Ambulatory Visit: Payer: 59 | Admitting: Nurse Practitioner

## 2022-04-15 VITALS — BP 137/82 | HR 96

## 2022-04-15 DIAGNOSIS — Z87442 Personal history of urinary calculi: Secondary | ICD-10-CM

## 2022-04-15 DIAGNOSIS — N200 Calculus of kidney: Secondary | ICD-10-CM

## 2022-04-15 NOTE — Patient Instructions (Signed)
Dietary Guidelines to Help Prevent Kidney Stones Kidney stones are deposits of minerals and salts that form inside your kidneys. Your risk of developing kidney stones may be greater depending on your diet, your lifestyle, the medicines you take, and whether you have certain medical conditions. Most people can lower their chances of developing kidney stones by following the instructions below. Your dietitian may give you more specific instructions depending on your overall health and the type of kidney stones you tend to develop. What are tips for following this plan? Reading food labels  Choose foods with "no salt added" or "low-salt" labels. Limit your salt (sodium) intake to less than 1,500 mg a day. Choose foods with calcium for each meal and snack. Try to eat about 300 mg of calcium at each meal. Foods that contain 200-500 mg of calcium a serving include: 8 oz (237 mL) of milk, calcium-fortifiednon-dairy milk, and calcium-fortifiedfruit juice. Calcium-fortified means that calcium has been added to these drinks. 8 oz (237 mL) of kefir, yogurt, and soy yogurt. 4 oz (114 g) of tofu. 1 oz (28 g) of cheese. 1 cup (150 g) of dried figs. 1 cup (91 g) of cooked broccoli. One 3 oz (85 g) can of sardines or mackerel. Most people need 1,000-1,500 mg of calcium a day. Talk to your dietitian about how much calcium is recommended for you. Shopping Buy plenty of fresh fruits and vegetables. Most people do not need to avoid fruits and vegetables, even if these foods contain nutrients that may contribute to kidney stones. When shopping for convenience foods, choose: Whole pieces of fruit. Pre-made salads with dressing on the side. Low-fat fruit and yogurt smoothies. Avoid buying frozen meals or prepared deli foods. These can be high in sodium. Look for foods with live cultures, such as yogurt and kefir. Choose high-fiber grains, such as whole-wheat breads, oat bran, and wheat cereals. Cooking Do not add  salt to food when cooking. Place a salt shaker on the table and allow each person to add his or her own salt to taste. Use vegetable protein, such as beans, textured vegetable protein (TVP), or tofu, instead of meat in pasta, casseroles, and soups. Meal planning Eat less salt, if told by your dietitian. To do this: Avoid eating processed or pre-made food. Avoid eating fast food. Eat less animal protein, including cheese, meat, poultry, or fish, if told by your dietitian. To do this: Limit the number of times you have meat, poultry, fish, or cheese each week. Eat a diet free of meat at least 2 days a week. Eat only one serving each day of meat, poultry, fish, or seafood. When you prepare animal protein, cut pieces into small portion sizes. For most meat and fish, one serving is about the size of the palm of your hand. Eat at least five servings of fresh fruits and vegetables each day. To do this: Keep fruits and vegetables on hand for snacks. Eat one piece of fruit or a handful of berries with breakfast. Have a salad and fruit at lunch. Have two kinds of vegetables at dinner. Limit foods that are high in a substance called oxalate. These include: Spinach (cooked), rhubarb, beets, sweet potatoes, and Swiss chard. Peanuts. Potato chips, french fries, and baked potatoes with skin on. Nuts and nut products. Chocolate. If you regularly take a diuretic medicine, make sure to eat at least 1 or 2 servings of fruits or vegetables that are high in potassium each day. These include: Avocado. Banana. Orange, prune,   carrot, or tomato juice. Baked potato. Cabbage. Beans and split peas. Lifestyle  Drink enough fluid to keep your urine pale yellow. This is the most important thing you can do. Spread your fluid intake throughout the day. If you drink alcohol: Limit how much you use to: 0-1 drink a day for women who are not pregnant. 0-2 drinks a day for men. Be aware of how much alcohol is in your  drink. In the [place] one drink equals one 12 oz bottle of beer (355 mL), one 5 oz glass of wine (148 mL), or one 1 oz glass of hard liquor (44 mL). Lose weight if told by your health care provider. Work with your dietitian to find an eating plan and weight loss strategies that work best for you. General information Talk to your health care provider and dietitian about taking daily supplements. You may be told the following depending on your health and the cause of your kidney stones: Not to take supplements with vitamin C. To take a calcium supplement. To take a daily probiotic supplement. To take other supplements such as magnesium, fish oil, or vitamin B6. Take over-the-counter and prescription medicines only as told by your health care provider. These include supplements. What foods should I limit? Limit your intake of the following foods, or eat them as told by your dietitian. Vegetables Spinach. Rhubarb. Beets. Canned vegetables. Pickles. Olives. Baked potatoes with skin. Grains Wheat bran. Baked goods. Salted crackers. Cereals high in sugar. Meats and other proteins Nuts. Nut butters. Large portions of meat, poultry, or fish. Salted, precooked, or cured meats, such as sausages, meat loaves, and hot dogs. Dairy Cheese. Beverages Regular soft drinks. Regular vegetable juice. Seasonings and condiments Seasoning blends with salt. Salad dressings. Soy sauce. Ketchup. Barbecue sauce. Other foods Canned soups. Canned pasta sauce. Casseroles. Pizza. Lasagna. Frozen meals. Potato chips. French fries. The items listed above may not be a complete list of foods and beverages you should limit. Contact a dietitian for more information. What foods should I avoid? Talk to your dietitian about specific foods you should avoid based on the type of kidney stones you have and your overall health. Fruits Grapefruit. The item listed above may not be a complete list of foods and beverages you should  avoid. Contact a dietitian for more information. Summary Kidney stones are deposits of minerals and salts that form inside your kidneys. You can lower your risk of kidney stones by making changes to your diet. The most important thing you can do is drink enough fluid. Drink enough fluid to keep your urine pale yellow. Talk to your dietitian about how much calcium you should have each day, and eat less salt and animal protein as told by your dietitian. This information is not intended to replace advice given to you by your health care provider. Make sure you discuss any questions you have with your health care provider. Document Revised: 07/28/2021 Document Reviewed: 07/28/2021 Elsevier Patient Education  2023 [employer]  

## 2022-04-15 NOTE — Progress Notes (Signed)
04/15/2022 9:03 AM   [name] May 14, 1976 [id number]  Referring provider: Daryll DrownIjaola, Onyeje M, NP [address] [place],  [place] [id number]  Followup nephrolithiasis   HPI: [name] is a 45yo here for followup for nephrolithiasis. NO flank pain currently. She is doing well after her bilateral ureteroscopic stone extraction. No significatn LUTS. She has not had a recent metabolic evaluation of her recurrent calculi. No other complaints today   PMH: Past Medical History:  Diagnosis Date   Asthma    COPD (chronic obstructive pulmonary disease) (HCC)    sees PCP   Depression    GERD (gastroesophageal reflux disease)    History of kidney stones    Hypertension    pt. denies  never taken meds   Pneumonia    PONV (postoperative nausea and vomiting)     Surgical History: Past Surgical History:  Procedure Laterality Date   ABDOMINAL HYSTERECTOMY     CHOLECYSTECTOMY     CYSTOSCOPY W/ RETROGRADES  08/04/2011   Procedure: CYSTOSCOPY WITH RETROGRADE PYELOGRAM;  Surgeon: Ky BarbanMohammad I Javaid;  Location: [facility];  Service: Urology;  Laterality: Right;  Right Retrograde   CYSTOSCOPY WITH RETROGRADE PYELOGRAM, URETEROSCOPY AND STENT PLACEMENT Left 07/24/2021   Procedure: CYSTOSCOPY WITH RETROGRADE PYELOGRAM, URETEROSCOPY AND STENT PLACEMENT;  Surgeon: Malen GauzeMcKenzie, Jakala Herford L, MD;  Location: [facility];  Service: Urology;  Laterality: Left;   CYSTOSCOPY WITH RETROGRADE PYELOGRAM, URETEROSCOPY AND STENT PLACEMENT Bilateral 03/02/2022   Procedure: CYSTOSCOPY WITH RETROGRADE PYELOGRAM, RIGHT URETEROSCOPY AND BILATERAL STENT PLACEMENT;  Surgeon: Malen GauzeMcKenzie, Merle Cirelli L, MD;  Location: [facility];  Service: Urology;  Laterality: Bilateral;   CYSTOSCOPY WITH RETROGRADE PYELOGRAM, URETEROSCOPY AND STENT PLACEMENT Bilateral 03/23/2022   Procedure: CYSTOSCOPY WITH RETROGRADE PYELOGRAM, URETEROSCOPY AND STENT REMOVAL;  Surgeon: Malen GauzeMcKenzie, Shravya Wickwire L, MD;  Location: [facility];  Service: Urology;  Laterality: Bilateral;    HOLMIUM LASER APPLICATION N/A 04/15/2018   Procedure: HOLMIUM LASER APPLICATION;  Surgeon: Crist FatHerrick, Benjamin W, MD;  Location: [facility];  Service: Urology;  Laterality: N/A;   HOLMIUM LASER APPLICATION Left 07/24/2021   Procedure: HOLMIUM LASER APPLICATION;  Surgeon: Malen GauzeMcKenzie, Westen Dinino L, MD;  Location: [facility];  Service: Urology;  Laterality: Left;   HOLMIUM LASER APPLICATION Bilateral 03/02/2022   Procedure: HOLMIUM LASER APPLICATION;  Surgeon: Malen GauzeMcKenzie, Verdia Bolt L, MD;  Location: [facility];  Service: Urology;  Laterality: Bilateral;   LITHOTRIPSY     NEPHROLITHOTOMY Right 04/15/2018   Procedure: RIGHT NEPHROLITHOTOMY PERCUTANEOUS WITH ACCESS;  Surgeon: Crist FatHerrick, Benjamin W, MD;  Location: [facility];  Service: Urology;  Laterality: Right;   NEPHROLITHOTOMY Right 12/29/2018   Procedure: NEPHROLITHOTOMY PERCUTANEOUS;  Surgeon: Crist FatHerrick, Benjamin W, MD;  Location: [facility];  Service: Urology;  Laterality: Right;   PERCUTANEOUS NEPHROLITHOTRIPSY  2012   both sides kidney    STONE EXTRACTION WITH BASKET Bilateral 03/02/2022   Procedure: STONE EXTRACTION WITH BASKET;  Surgeon: Malen GauzeMcKenzie, Artasia Thang L, MD;  Location: [facility];  Service: Urology;  Laterality: Bilateral;   STONE EXTRACTION WITH BASKET Bilateral 03/23/2022   Procedure: STONE EXTRACTION WITH BASKET;  Surgeon: Malen GauzeMcKenzie, Jaida Basurto L, MD;  Location: [facility];  Service: Urology;  Laterality: Bilateral;   TUBAL LIGATION      Home Medications:  Allergies as of 04/15/2022       Reactions   Hydrocodone Nausea And Vomiting   Morphine And Related Hives, Other (See Comments)   Welps   Vicodin [hydrocodone-acetaminophen] Nausea And Vomiting        Medication List  Accurate as of Apr 15, 2022  9:03 AM. If you have any questions, ask your nurse or doctor.          B-12 PO Take 1 tablet by mouth daily.   cetirizine 10 MG tablet Commonly known as: ZYRTEC Take 10 mg by mouth daily as needed for allergies.   cholecalciferol 25 MCG (1000 UNIT)  tablet Commonly known as: VITAMIN D3 Take 1,000 Units by mouth daily.   diazepam 5 MG tablet Commonly known as: VALIUM Take 1 tablet (5 mg total) by mouth 2 (two) times daily.   multivitamin with minerals Tabs tablet Take 1 tablet by mouth daily.   ondansetron 4 MG tablet Commonly known as: Zofran Take 1 tablet (4 mg total) by mouth every 8 (eight) hours as needed for nausea or vomiting.   oxybutynin 5 MG tablet Commonly known as: DITROPAN Take 1 tablet (5 mg total) by mouth 3 (three) times daily.   oxyCODONE-acetaminophen 5-325 MG tablet Commonly known as: PERCOCET/ROXICET Take 1 tablet by mouth every 4 (four) hours as needed for severe pain.        Allergies:  Allergies  Allergen Reactions   Hydrocodone Nausea And Vomiting   Morphine And Related Hives and Other (See Comments)    Welps   Vicodin [Hydrocodone-Acetaminophen] Nausea And Vomiting    Family History: Family History  Problem Relation Age of Onset   COPD Mother    Cancer Father    COPD Father    Heart attack Father     Social History:  reports that she quit smoking about 14 months ago. Her smoking use included cigarettes. She has a 9.00 pack-year smoking history. She has never used smokeless tobacco. She reports that she does not drink alcohol and does not use drugs.  ROS: All other review of systems were reviewed and are negative except what is noted above in HPI  Physical Exam: BP 137/82   Pulse 96   LMP 11/05/2015   Constitutional:  Alert and oriented, No acute distress. HEENT: [place] AT, moist mucus membranes.  Trachea midline, no masses. Cardiovascular: No clubbing, cyanosis, or edema. Respiratory: Normal respiratory effort, no increased work of breathing. GI: Abdomen is soft, nontender, nondistended, no abdominal masses GU: No CVA tenderness.  Lymph: No cervical or inguinal lymphadenopathy. Skin: No rashes, bruises or suspicious lesions. Neurologic: Grossly intact, no focal deficits, moving  all 4 extremities. Psychiatric: Normal mood and affect.  Laboratory Data: Lab Results  Component Value Date   WBC 10.4 03/20/2022   HGB 13.0 03/20/2022   HCT 40.2 03/20/2022   MCV 92.8 03/20/2022   PLT 300 03/20/2022    Lab Results  Component Value Date   CREATININE 0.95 03/20/2022    No results found for: PSA  No results found for: TESTOSTERONE  Lab Results  Component Value Date   HGBA1C  01/20/2008    5.6 (NOTE)   The ADA recommends the following therapeutic goals for glycemic   control related to Hgb A1C measurement:   Goal of Therapy:   < 7.0% Hgb A1C   Action Suggested:  > 8.0% Hgb A1C   Ref:  Diabetes Care, 22, Suppl. 1, 1999    Urinalysis    Component Value Date/Time   COLORURINE AMBER (A) 03/20/2022 0932   APPEARANCEUR CLOUDY (A) 03/20/2022 0932   APPEARANCEUR Cloudy (A) 03/16/2022 1203   LABSPEC 1.015 03/20/2022 0932   PHURINE 8.0 03/20/2022 0932   GLUCOSEU 50 (A) 03/20/2022 0932   HGBUR LARGE (A) 03/20/2022  0932   BILIRUBINUR NEGATIVE 03/20/2022 0932   BILIRUBINUR Negative 03/16/2022 1203   KETONESUR NEGATIVE 03/20/2022 0932   PROTEINUR 100 (A) 03/20/2022 0932   UROBILINOGEN 0.2 02/12/2015 1932   NITRITE NEGATIVE 03/20/2022 0932   LEUKOCYTESUR MODERATE (A) 03/20/2022 0932    Lab Results  Component Value Date   LABMICR See below: 03/16/2022   WBCUA 6-10 (A) 03/16/2022   LABEPIT 0-10 03/16/2022   MUCUS Present 02/10/2022   BACTERIA NONE SEEN 03/20/2022    Pertinent Imaging:  Results for orders placed during the hospital encounter of 07/02/21  DG Abd 1 View  Narrative CLINICAL DATA:  Left-sided flank pain.  EXAM: ABDOMEN - 1 VIEW  COMPARISON:  Ultrasound renal 07/02/2021, CT renal 10/10/2019  FINDINGS: Right upper quadrant surgical clips. The bowel gas pattern is normal. No radio-opaque calculi or other significant radiographic abnormality are seen.  IMPRESSION: Negative radiograph.  These results and ultrasound renal results from  same day will be called to the ordering clinician or representative by the Radiologist Assistant, and communication documented in the PACS or [employer].   Electronically Signed By: Tish Frederickson M.D. On: 07/03/2021 21:41  No results found for this or any previous visit.  No results found for this or any previous visit.  No results found for this or any previous visit.  Results for orders placed during the hospital encounter of 11/11/21  Ultrasound renal complete  Narrative CLINICAL DATA:  Nephrolithiasis follow-up.  EXAM: RENAL / URINARY TRACT ULTRASOUND COMPLETE  COMPARISON:  August 28, 2021  FINDINGS: Right Kidney:  Renal measurements: 12.9 cm x 5.3 cm x 6.6 cm = volume: 235.72 mL. Echogenicity within normal limits. A 9 mm shadowing echogenic renal stone is seen within the lower pole of the right kidney. No mass or hydronephrosis visualized.  Left Kidney:  Renal measurements: 13.2 cm x 6.2 cm x 7.2 cm = volume: 309.30 mL. Echogenicity within normal limits. A 12 mm shadowing echogenic renal stone is seen within the lower pole of the left kidney. No mass or hydronephrosis visualized.  Bladder:  Appears normal for degree of bladder distention. The bilateral ureteral jets are not visualized.  Other:  None.  IMPRESSION: Bilateral nonobstructing renal calculi.   Electronically Signed By: Aram Candela M.D. On: 11/12/2021 21:02  No results found for this or any previous visit.  No results found for this or any previous visit.  Results for orders placed during the hospital encounter of 03/20/22  CT Renal Stone Study  Narrative CLINICAL DATA:  46 year old female with history of nephrolithiasis and bilateral indwelling double-J nephroureteral stents presenting with lower abdominal pain, urinary frequency, and chills.  EXAM: CT ABDOMEN AND PELVIS WITHOUT CONTRAST  TECHNIQUE: Multidetector CT imaging of the abdomen and pelvis was  performed following the standard protocol without IV contrast.  RADIATION DOSE REDUCTION: This exam was performed according to the departmental dose-optimization program which includes automated exposure control, adjustment of the mA and/or kV according to patient size and/or use of iterative reconstruction technique.  COMPARISON:  03/06/2022  FINDINGS: Lower chest: No acute abnormality.  Hepatobiliary: Marked diffuse decreased attenuation of the hepatic parenchyma. No evidence of hepatic masses. Status post cholecystectomy. No intra or extrahepatic biliary duct dilation.  Pancreas: Unremarkable. No pancreatic ductal dilatation or surrounding inflammatory changes.  Spleen: Normal in size without focal abnormality.  Adrenals/Urinary Tract: The adrenal glands are no in size morphology bilaterally. Bilateral indwelling double-J nephroureteral stents are well positioned, unchanged. There is unchanged mild bilateral hydronephrosis. No  evidence of hydroureter. Multifocal punctate bilateral nephrolithiasis, unchanged. The urinary bladder is decompressed.  Stomach/Bowel: Similar appearing small hiatal hernia. Stomach is otherwise within normal limits. Appendix appears normal. No evidence of bowel wall thickening, distention, or inflammatory changes.  Vascular/Lymphatic: No significant vascular findings are present. No enlarged abdominal or pelvic lymph nodes.  Reproductive: Status post hysterectomy. No adnexal masses.  Other: No abdominal wall hernia or abnormality. No abdominopelvic ascites.  Musculoskeletal: No acute or significant osseous findings.  IMPRESSION: 1. No acute abdominopelvic abnormality. 2. Unchanged mild bilateral hydronephrosis with unchanged position of indwelling bilateral double-J nephroureteral stents. 3. Multifocal punctate bilateral nephrolithiasis, unchanged. 4. Severe hepatic steatosis. 5. Small hiatal hernia.  Marliss Coots, MD  [facility]  [facility]   Electronically Signed By: Marliss Coots M.D. On: 03/20/2022 13:05   Assessment & Plan:    1. Kidney stones -Uric acid -24 hour urine RTC 4-6 weeks with a renal US - Urinalysis, Routine w reflex microscopic   No follow-ups on file.  [name], MD  [facility] [place]

## 2022-04-16 LAB — URIC ACID: Uric Acid: 6.6 mg/dL — ABNORMAL HIGH (ref 2.6–6.2)

## 2022-04-28 DIAGNOSIS — N200 Calculus of kidney: Secondary | ICD-10-CM | POA: Diagnosis not present

## 2022-04-29 ENCOUNTER — Ambulatory Visit (HOSPITAL_COMMUNITY)
Admission: RE | Admit: 2022-04-29 | Discharge: 2022-04-29 | Disposition: A | Discharge status: Home / Self Care | Payer: 59 | Source: Ambulatory Visit | Attending: Urology | Admitting: Urology

## 2022-04-29 DIAGNOSIS — N281 Cyst of kidney, acquired: Secondary | ICD-10-CM | POA: Diagnosis not present

## 2022-04-29 DIAGNOSIS — N3289 Other specified disorders of bladder: Secondary | ICD-10-CM | POA: Diagnosis not present

## 2022-04-29 DIAGNOSIS — N200 Calculus of kidney: Secondary | ICD-10-CM | POA: Insufficient documentation

## 2022-04-30 ENCOUNTER — Encounter: Payer: Self-pay | Admitting: Nurse Practitioner

## 2022-04-30 ENCOUNTER — Ambulatory Visit (INDEPENDENT_AMBULATORY_CARE_PROVIDER_SITE_OTHER): Payer: 59 | Admitting: Nurse Practitioner

## 2022-04-30 VITALS — BP 141/85 | HR 101 | Ht 65.5 in | Wt 316.0 lb

## 2022-04-30 DIAGNOSIS — R768 Other specified abnormal immunological findings in serum: Secondary | ICD-10-CM | POA: Diagnosis not present

## 2022-04-30 DIAGNOSIS — R7303 Prediabetes: Secondary | ICD-10-CM | POA: Diagnosis not present

## 2022-04-30 LAB — POCT GLYCOSYLATED HEMOGLOBIN (HGB A1C): HbA1c, POC (controlled diabetic range): 6.1 % (ref 0.0–7.0)

## 2022-04-30 MED ORDER — TRULICITY 1.5 MG/0.5ML ~~LOC~~ SOAJ: 1.5000 mg | SUBCUTANEOUS | 3 refills | Status: DC

## 2022-04-30 NOTE — Patient Instructions (Signed)
Antithyroglobulin Antibody Test Why am I having this test? Your health care provider may order an antithyroglobulin antibody test to help diagnose a number of autoimmune thyroid conditions and other related diseases. What is being tested? This test checks for the presence of antibodies that form against the thyroid cells of your body (autoantibodies). Thyroglobulin is a hormone that normally stays in the thyroid gland and is important in the activity of other thyroid hormones. Inflammation of the thyroid gland can result in leakage of thyroglobulin into the bloodstream. This causes autoantibodies to thyroglobulin to form in some people. What kind of sample is taken?  A blood sample is required for this test. It is usually collected by inserting a needle into a blood vessel. How are the results reported? Your test results will be reported as a value. Your health care provider will compare your results to normal ranges that were established after testing a large group of people (reference ranges). Reference ranges may vary among labs and hospitals. For this test, a common reference range is: 0-116 international units/mL. What do the results mean? Increased levels of antibodies may mean that you have any of these conditions: Hashimoto's thyroiditis. Graves' disease. Rheumatoid arthritis. Hypothyroidism. Thyroid cancer. Certain types of anemia. Talk with your health care provider about what your results mean. Questions to ask your health care provider Ask your health care provider, or the department that is doing the test: When will my results be ready? How will I get my results? What are my treatment options? What other tests do I need? What are my next steps? Summary An antithyroglobulin antibody test may be used to help diagnose a number of autoimmune thyroid conditions and other related diseases. Increased levels of antibodies can be seen in thyroid conditions such as Hashimoto's  thyroiditis and hypothyroidism. Talk with your health care provider about what your results mean. This information is not intended to replace advice given to you by your health care provider. Make sure you discuss any questions you have with your health care provider. Document Revised: 11/12/2021 Document Reviewed: 11/12/2021 Elsevier Patient Education  2023 [employer]  

## 2022-04-30 NOTE — Progress Notes (Signed)
04/30/2022     Endocrinology Follow Up Note    Subjective:    Patient ID: [name], female    DOB: 04-21-1976, PCP Ivy Lynn, NP.   Past Medical History:  Diagnosis Date   Asthma    COPD (chronic obstructive pulmonary disease) ([place])    sees PCP   Depression    GERD (gastroesophageal reflux disease)    History of kidney stones    Hypertension    pt. denies  never taken meds   Pneumonia    PONV (postoperative nausea and vomiting)     Past Surgical History:  Procedure Laterality Date   ABDOMINAL HYSTERECTOMY     CHOLECYSTECTOMY     CYSTOSCOPY W/ RETROGRADES  08/04/2011   Procedure: CYSTOSCOPY WITH RETROGRADE PYELOGRAM;  Surgeon: Marissa Nestle;  Location: [facility];  Service: Urology;  Laterality: Right;  Right Retrograde   CYSTOSCOPY WITH RETROGRADE PYELOGRAM, URETEROSCOPY AND STENT PLACEMENT Left 07/24/2021   Procedure: CYSTOSCOPY WITH RETROGRADE PYELOGRAM, URETEROSCOPY AND STENT PLACEMENT;  Surgeon: Cleon Gustin, MD;  Location: [facility];  Service: Urology;  Laterality: Left;   CYSTOSCOPY WITH RETROGRADE PYELOGRAM, URETEROSCOPY AND STENT PLACEMENT Bilateral 03/02/2022   Procedure: CYSTOSCOPY WITH RETROGRADE PYELOGRAM, RIGHT URETEROSCOPY AND BILATERAL STENT PLACEMENT;  Surgeon: Cleon Gustin, MD;  Location: [facility];  Service: Urology;  Laterality: Bilateral;   CYSTOSCOPY WITH RETROGRADE PYELOGRAM, URETEROSCOPY AND STENT PLACEMENT Bilateral 03/23/2022   Procedure: CYSTOSCOPY WITH RETROGRADE PYELOGRAM, URETEROSCOPY AND STENT REMOVAL;  Surgeon: Cleon Gustin, MD;  Location: [facility];  Service: Urology;  Laterality: Bilateral;   HOLMIUM LASER APPLICATION N/A [id number]   Procedure: HOLMIUM LASER APPLICATION;  Surgeon: Ardis Hughs, MD;  Location: [facility];  Service: Urology;  Laterality: N/A;   HOLMIUM LASER APPLICATION Left 0000000   Procedure: HOLMIUM LASER APPLICATION;  Surgeon: Cleon Gustin, MD;  Location: [facility];  Service: Urology;   Laterality: Left;   HOLMIUM LASER APPLICATION Bilateral 0000000   Procedure: HOLMIUM LASER APPLICATION;  Surgeon: Cleon Gustin, MD;  Location: [facility];  Service: Urology;  Laterality: Bilateral;   LITHOTRIPSY     NEPHROLITHOTOMY Right 04/15/2018   Procedure: RIGHT NEPHROLITHOTOMY PERCUTANEOUS WITH ACCESS;  Surgeon: Ardis Hughs, MD;  Location: [facility];  Service: Urology;  Laterality: Right;   NEPHROLITHOTOMY Right 12/29/2018   Procedure: NEPHROLITHOTOMY PERCUTANEOUS;  Surgeon: Ardis Hughs, MD;  Location: [facility];  Service: Urology;  Laterality: Right;   PERCUTANEOUS NEPHROLITHOTRIPSY  2012   both sides kidney    STONE EXTRACTION WITH BASKET Bilateral 03/02/2022   Procedure: STONE EXTRACTION WITH BASKET;  Surgeon: Cleon Gustin, MD;  Location: [facility];  Service: Urology;  Laterality: Bilateral;   STONE EXTRACTION WITH BASKET Bilateral 03/23/2022   Procedure: STONE EXTRACTION WITH BASKET;  Surgeon: Cleon Gustin, MD;  Location: [facility];  Service: Urology;  Laterality: Bilateral;   TUBAL LIGATION      Social History   Socioeconomic History   Marital status: Single    Spouse name: Not on file   Number of children: Not on file   Years of education: Not on file   Highest education level: Not on file  Occupational History   Not on file  Tobacco Use   Smoking status: Former    Packs/day: 0.50    Years: 18.00    Pack years: 9.00    Types: Cigarettes    Quit date: 01/21/2021    Years since quitting: 1.2  Smokeless tobacco: Never  Vaping Use   Vaping Use: Never used  Substance and Sexual Activity   Alcohol use: No   Drug use: No   Sexual activity: Yes    Birth control/protection: Surgical  Other Topics Concern   Not on file  Social History Narrative   Not on file   Social Determinants of Health   Financial Resource Strain: Not on file  Food Insecurity: Not on file  Transportation Needs: Not on file  Physical Activity: Not on file  Stress: Not on  file  Social Connections: Not on file    Family History  Problem Relation Age of Onset   COPD Mother    Cancer Father    COPD Father    Heart attack Father     Outpatient Encounter Medications as of 04/30/2022  Medication Sig   Dulaglutide (TRULICITY) 1.5 0000000 SOPN Inject 1.5 mg into the skin once a week.   cetirizine (ZYRTEC) 10 MG tablet Take 10 mg by mouth daily as needed for allergies.   cholecalciferol (VITAMIN D3) 25 MCG (1000 UNIT) tablet Take 1,000 Units by mouth daily.   Cyanocobalamin (B-12 PO) Take 1 tablet by mouth daily.   diazepam (VALIUM) 5 MG tablet Take 1 tablet (5 mg total) by mouth 2 (two) times daily.   Multiple Vitamin (MULTIVITAMIN WITH MINERALS) TABS tablet Take 1 tablet by mouth daily.   ondansetron (ZOFRAN) 4 MG tablet Take 1 tablet (4 mg total) by mouth every 8 (eight) hours as needed for nausea or vomiting.   oxybutynin (DITROPAN) 5 MG tablet Take 1 tablet (5 mg total) by mouth 3 (three) times daily.   oxyCODONE-acetaminophen (PERCOCET/ROXICET) 5-325 MG tablet Take 1 tablet by mouth every 4 (four) hours as needed for severe pain.   No facility-administered encounter medications on file as of 04/30/2022.    ALLERGIES: Allergies  Allergen Reactions   Hydrocodone Nausea And Vomiting   Morphine And Related Hives and Other (See Comments)    Welps   Vicodin [Hydrocodone-Acetaminophen] Nausea And Vomiting    VACCINATION STATUS: Immunization History  Administered Date(s) Administered   Influenza,inj,Quad PF,6+ Mos 09/11/2014, 09/05/2015, 09/01/2016, 12/30/2018     HPI  [name] is 46 y.o. female who presents today with a medical history as above. she is being seen in follow up after being seen in consultation for hyperthyroidism requested by Ivy Lynn, NP.  she has been dealing with symptoms of progressive weight gain, fatigue, kidney stones (multiple), and constipation, depression, heat intolerance, palpitations and tremors for about a  year. These symptoms are progressively worsening and troubling to her.  her most recent thyroid labs revealed suppressed TSH of 0.382 on 02/06/22.  She does note intermittent hoarseness.  she denies dysphagia, choking, shortness of breath.   she does have family history of thyroid dysfunction in her mothers side including some of her sisters and nieces, but denies family hx of thyroid cancer. she denies personal history of goiter. she is not on any anti-thyroid medications nor on any thyroid hormone supplements. Denies use of Biotin containing supplements other than a womens formulated MVI.  she is willing to proceed with appropriate work up and therapy for thyrotoxicosis.   Review of systems  Constitutional: + rapidly increasing body weight, current Body mass index is 51.79 kg/m., + fatigue, + subjective hyperthermia, no subjective hypothermia Eyes: no blurry vision, no xerophthalmia ENT: no sore throat, no nodules palpated in throat, no dysphagia/odynophagia, + hoarseness Cardiovascular: no chest pain, no shortness  of breath, + palpitations, no leg swelling Respiratory: no cough, no shortness of breath Gastrointestinal: no nausea/vomiting/diarrhea, + chronic constipation Musculoskeletal: no muscle/joint aches Skin: no rashes, no hyperemia Neurological: + tremors, no numbness, no tingling, no dizziness Psychiatric: + depression, no anxiety   Objective:    BP (!) 141/85   Pulse (!) 101   Ht 5' 5.5" (1.664 m)   Wt (!) 316 lb (143.3 kg)   LMP 11/05/2015   BMI 51.79 kg/m   Wt Readings from Last 3 Encounters:  04/30/22 (!) 316 lb (143.3 kg)  04/01/22 (!) 310 lb (140.6 kg)  03/23/22 285 lb 0.9 oz (129.3 kg)     BP Readings from Last 3 Encounters:  04/30/22 (!) 141/85  04/15/22 137/82  04/01/22 135/82                          Physical Exam- Limited  Constitutional:  Body mass index is 51.79 kg/m. , not in acute distress, tearful today, carries weight mostly in mid region,  dorsocervical fat pad present Eyes:  EOMI, no exophthalmos Neck: Supple Thyroid: No gross goiter Cardiovascular: RRR, no murmurs, rubs, or gallops, no edema Respiratory: Adequate breathing efforts, no crackles, rales, rhonchi, or wheezing Musculoskeletal: no gross deformities, strength intact in all four extremities, no gross restriction of joint movements Skin:  no rashes, no hyperemia Neurological: mild tremor with outstretched hands L>R   CMP     Component Value Date/Time   NA 138 03/20/2022 1100   NA 139 02/06/2022 1043   K 3.7 03/20/2022 1100   CL 104 03/20/2022 1100   CO2 26 03/20/2022 1100   GLUCOSE 122 (H) 03/20/2022 1100   BUN 15 03/20/2022 1100   BUN 9 02/06/2022 1043   CREATININE 0.95 03/20/2022 1100   CALCIUM 9.0 04/02/2022 0825   PROT 7.2 03/07/2022 0604   PROT 7.3 02/06/2022 1043   ALBUMIN 3.3 (L) 03/07/2022 0604   ALBUMIN 3.9 02/06/2022 1043   AST 18 03/07/2022 0604   ALT 27 03/07/2022 0604   ALKPHOS 72 03/07/2022 0604   BILITOT 0.8 03/07/2022 0604   BILITOT 0.2 02/06/2022 1043   GFRNONAA >60 03/20/2022 1100   GFRAA >60 10/12/2019 0745     CBC    Component Value Date/Time   WBC 10.4 03/20/2022 1100   RBC 4.33 03/20/2022 1100   HGB 13.0 03/20/2022 1100   HGB 13.9 02/06/2022 1043   HCT 40.2 03/20/2022 1100   HCT 41.6 02/06/2022 1043   PLT 300 03/20/2022 1100   PLT 304 02/06/2022 1043   MCV 92.8 03/20/2022 1100   MCV 91 02/06/2022 1043   MCH 30.0 03/20/2022 1100   MCHC 32.3 03/20/2022 1100   RDW 12.9 03/20/2022 1100   RDW 12.6 02/06/2022 1043   LYMPHSABS 3.0 03/20/2022 1100   LYMPHSABS 2.6 02/06/2022 1043   MONOABS 0.7 03/20/2022 1100   EOSABS 0.1 03/20/2022 1100   EOSABS 0.1 02/06/2022 1043   BASOSABS 0.0 03/20/2022 1100   BASOSABS 0.1 02/06/2022 1043     Diabetic Labs (most recent): Lab Results  Component Value Date   HGBA1C 6.1 04/30/2022   HGBA1C  01/20/2008    5.6 (NOTE)   The ADA recommends the following therapeutic goals for  glycemic   control related to Hgb A1C measurement:   Goal of Therapy:   < 7.0% Hgb A1C   Action Suggested:  > 8.0% Hgb A1C   Ref:  Diabetes Care, 22, Suppl. 1, 1999  Lipid Panel     Component Value Date/Time   CHOL 160 02/06/2022 1043   TRIG 169 (H) 02/06/2022 1043   HDL 40 02/06/2022 1043   CHOLHDL 4.0 02/06/2022 1043   LDLCALC 91 02/06/2022 1043   LABVLDL 29 02/06/2022 1043     Lab Results  Component Value Date   TSH 0.723 04/02/2022   TSH 0.382 (L) 02/06/2022   FREET4 1.23 04/02/2022   FREET4 CANCELED 02/06/2022        Assessment & Plan:   1. Thyroid antibody positive  Her repeat thyroid function tests show positive antibodies indicating autoimmune thyroid dysfunction.  However, based on her current labs, she is still in euthyroid presentation.  She will need routine follow up to determine if/when additional thyroid testing and intervention will be needed.  2. Prediabetes  Her A1c today is 6.1% putting her in the prediabetes category.  I discussed and initiated trial of Trulicity 1.5 mg SQ weekly (gave sample of Ozempic 0.25 mg SQ weekly since no sample of Trulicity available).  She did perform the injection properly in the room with my supervision today.  The following Lifestyle Medicine recommendations according to [facility]) were discussed and offered to patient and she agrees to start the journey:  A. Whole Foods, Plant-based plate comprising of fruits and vegetables, plant-based proteins, whole-grain carbohydrates was discussed in detail with the patient.   A list for source of those nutrients were also provided to the patient.  Patient will use only water or unsweetened tea for hydration. B.  The need to stay away from risky substances including alcohol, smoking; obtaining 7 to 9 hours of restorative sleep, at least 150 minutes of moderate intensity exercise weekly, the importance of healthy social connections,  and stress reduction  techniques were discussed. C.  A full color page of  Calorie density of various food groups per pound showing examples of each food groups was provided to the patient.  - Nutritional counseling repeated at each appointment due to patients tendency to fall back in to old habits.  - The patient admits there is a room for improvement in their diet and drink choices. -  Suggestion is made for the patient to avoid simple carbohydrates from their diet including Cakes, Sweet Desserts / Pastries, Ice Cream, Soda (diet and regular), Sweet Tea, Candies, Chips, Cookies, Sweet Pastries, Store Bought Juices, Alcohol in Excess of 1-2 drinks a day, Artificial Sweeteners, Coffee Creamer, and "Sugar-free" Products. This will help patient to have stable blood glucose profile and potentially avoid unintended weight gain.   - I encouraged the patient to switch to unprocessed or minimally processed complex starch and increased protein intake (animal or plant source), fruits, and vegetables.   - Patient is advised to stick to a routine mealtimes to eat 3 meals a day and avoid unnecessary snacks (to snack only to correct hypoglycemia).    -Patient is advised to maintain close follow up with Ivy Lynn, NP for primary care needs.  I did check PTH given her history of kidney stones to rule out parathyroid involvement, labs were normal.  I also checked am cortisol level to assess adrenal function which was normal as well.    I spent 41 minutes in the care of the patient today including review of labs from [place], Lipids, Thyroid Function, Hematology (current and previous including abstractions from other facilities); face-to-face time discussing  her blood glucose readings/logs, discussing hypoglycemia and hyperglycemia episodes and symptoms, medications  doses, her options of short and long term treatment based on the latest standards of care / guidelines;  discussion about incorporating lifestyle medicine;  and  documenting the encounter.    Please refer to Patient Instructions for Blood Glucose Monitoring and Insulin/Medications Dosing Guide"  in media tab for additional information. Please  also refer to " Patient Self Inventory" in the Media  tab for reviewed elements of pertinent patient history.  [name] participated in the discussions, expressed understanding, and voiced agreement with the above plans.  All questions were answered to her satisfaction. she is encouraged to contact clinic should she have any questions or concerns prior to her return visit.  Follow up plan: Return in about 3 months (around 07/31/2022) for Thyroid follow up, Previsit labs; prediabetes follow up.   Thank you for involving me in the care of this pleasant patient, and I will continue to update you with her progress.    Rayetta Pigg, [facility] [address] [place], [place] [address] Phone: [phone] Fax: [phone]  04/30/2022, 4:29 PM

## 2022-05-06 ENCOUNTER — Other Ambulatory Visit: Payer: Self-pay | Admitting: Urology

## 2022-05-20 ENCOUNTER — Ambulatory Visit (INDEPENDENT_AMBULATORY_CARE_PROVIDER_SITE_OTHER): Payer: 59 | Admitting: Urology

## 2022-05-20 VITALS — BP 138/85 | HR 93

## 2022-05-20 DIAGNOSIS — N200 Calculus of kidney: Secondary | ICD-10-CM | POA: Diagnosis not present

## 2022-05-20 LAB — URINALYSIS, ROUTINE W REFLEX MICROSCOPIC
Bilirubin, UA: NEGATIVE
Glucose, UA: NEGATIVE
Ketones, UA: NEGATIVE
Leukocytes,UA: NEGATIVE
Nitrite, UA: NEGATIVE
Protein,UA: NEGATIVE
RBC, UA: NEGATIVE
Specific Gravity, UA: 1.025 (ref 1.005–1.030)
Urobilinogen, Ur: 0.2 mg/dL (ref 0.2–1.0)
pH, UA: 6.5 (ref 5.0–7.5)

## 2022-05-20 MED ORDER — ALLOPURINOL 300 MG PO TABS: 300.0000 mg | ORAL_TABLET | Freq: Every day | ORAL | 11 refills | Status: DC

## 2022-05-20 MED ORDER — POTASSIUM CITRATE ER 15 MEQ (1620 MG) PO TBCR: 1.0000 | EXTENDED_RELEASE_TABLET | Freq: Two times a day (BID) | ORAL | 11 refills | Status: DC

## 2022-05-20 MED ORDER — INDAPAMIDE 2.5 MG PO TABS: 2.5000 mg | ORAL_TABLET | Freq: Every day | ORAL | 11 refills | Status: DC

## 2022-05-20 NOTE — Progress Notes (Unsigned)
05/20/2022 2:20 PM   [name] Apr 01, 1976 [id number]  Referring provider: Daryll Drown, NP [address] [place],  [place] [address]  No chief complaint on file.   HPI:    PMH: Past Medical History:  Diagnosis Date   Asthma    COPD (chronic obstructive pulmonary disease) (HCC)    sees PCP   Depression    GERD (gastroesophageal reflux disease)    History of kidney stones    Hypertension    pt. denies  never taken meds   Pneumonia    PONV (postoperative nausea and vomiting)     Surgical History: Past Surgical History:  Procedure Laterality Date   ABDOMINAL HYSTERECTOMY     CHOLECYSTECTOMY     CYSTOSCOPY W/ RETROGRADES  08/04/2011   Procedure: CYSTOSCOPY WITH RETROGRADE PYELOGRAM;  Surgeon: Ky Barban;  Location: [facility];  Service: Urology;  Laterality: Right;  Right Retrograde   CYSTOSCOPY WITH RETROGRADE PYELOGRAM, URETEROSCOPY AND STENT PLACEMENT Left 07/24/2021   Procedure: CYSTOSCOPY WITH RETROGRADE PYELOGRAM, URETEROSCOPY AND STENT PLACEMENT;  Surgeon: Malen Gauze, MD;  Location: [facility];  Service: Urology;  Laterality: Left;   CYSTOSCOPY WITH RETROGRADE PYELOGRAM, URETEROSCOPY AND STENT PLACEMENT Bilateral 03/02/2022   Procedure: CYSTOSCOPY WITH RETROGRADE PYELOGRAM, RIGHT URETEROSCOPY AND BILATERAL STENT PLACEMENT;  Surgeon: Malen Gauze, MD;  Location: [facility];  Service: Urology;  Laterality: Bilateral;   CYSTOSCOPY WITH RETROGRADE PYELOGRAM, URETEROSCOPY AND STENT PLACEMENT Bilateral 03/23/2022   Procedure: CYSTOSCOPY WITH RETROGRADE PYELOGRAM, URETEROSCOPY AND STENT REMOVAL;  Surgeon: Malen Gauze, MD;  Location: [facility];  Service: Urology;  Laterality: Bilateral;   HOLMIUM LASER APPLICATION N/A 04/15/2018   Procedure: HOLMIUM LASER APPLICATION;  Surgeon: Crist Fat, MD;  Location: [facility];  Service: Urology;  Laterality: N/A;   HOLMIUM LASER APPLICATION Left 07/24/2021   Procedure: HOLMIUM LASER APPLICATION;  Surgeon:  Malen Gauze, MD;  Location: [facility];  Service: Urology;  Laterality: Left;   HOLMIUM LASER APPLICATION Bilateral 03/02/2022   Procedure: HOLMIUM LASER APPLICATION;  Surgeon: Malen Gauze, MD;  Location: [facility];  Service: Urology;  Laterality: Bilateral;   LITHOTRIPSY     NEPHROLITHOTOMY Right 04/15/2018   Procedure: RIGHT NEPHROLITHOTOMY PERCUTANEOUS WITH ACCESS;  Surgeon: Crist Fat, MD;  Location: [facility];  Service: Urology;  Laterality: Right;   NEPHROLITHOTOMY Right 12/29/2018   Procedure: NEPHROLITHOTOMY PERCUTANEOUS;  Surgeon: Crist Fat, MD;  Location: [facility];  Service: Urology;  Laterality: Right;   PERCUTANEOUS NEPHROLITHOTRIPSY  2012   both sides kidney    STONE EXTRACTION WITH BASKET Bilateral 03/02/2022   Procedure: STONE EXTRACTION WITH BASKET;  Surgeon: Malen Gauze, MD;  Location: [facility];  Service: Urology;  Laterality: Bilateral;   STONE EXTRACTION WITH BASKET Bilateral 03/23/2022   Procedure: STONE EXTRACTION WITH BASKET;  Surgeon: Malen Gauze, MD;  Location: [facility];  Service: Urology;  Laterality: Bilateral;   TUBAL LIGATION      Home Medications:  Allergies as of 05/20/2022       Reactions   Hydrocodone Nausea And Vomiting   Morphine And Related Hives, Other (See Comments)   Welps   Vicodin [hydrocodone-acetaminophen] Nausea And Vomiting        Medication List        Accurate as of May 20, 2022  2:20 PM. If you have any questions, ask your nurse or doctor.          allopurinol 300 MG tablet Commonly known as: ZYLOPRIM  Take 1 tablet (300 mg total) by mouth daily.   B-12 PO Take 1 tablet by mouth daily.   cetirizine 10 MG tablet Commonly known as: ZYRTEC Take 10 mg by mouth daily as needed for allergies.   cholecalciferol 25 MCG (1000 UNIT) tablet Commonly known as: VITAMIN D3 Take 1,000 Units by mouth daily.   diazepam 5 MG tablet Commonly known as: VALIUM Take 1 tablet (5 mg total) by mouth 2 (two) times  daily.   indapamide 2.5 MG tablet Commonly known as: LOZOL Take 1 tablet (2.5 mg total) by mouth daily.   multivitamin with minerals Tabs tablet Take 1 tablet by mouth daily.   ondansetron 4 MG tablet Commonly known as: Zofran Take 1 tablet (4 mg total) by mouth every 8 (eight) hours as needed for nausea or vomiting.   oxybutynin 5 MG tablet Commonly known as: DITROPAN Take 1 tablet (5 mg total) by mouth 3 (three) times daily.   oxyCODONE-acetaminophen 5-325 MG tablet Commonly known as: PERCOCET/ROXICET Take 1 tablet by mouth every 4 (four) hours as needed for severe pain.   Potassium Citrate 15 MEQ (1620 MG) Tbcr Commonly known as: Urocit-K 15 Take 1 tablet by mouth in the morning and at bedtime.   Trulicity 1.5 MG/0.5ML Sopn Generic drug: Dulaglutide Inject 1.5 mg into the skin once a week.        Allergies:  Allergies  Allergen Reactions   Hydrocodone Nausea And Vomiting   Morphine And Related Hives and Other (See Comments)    Welps   Vicodin [Hydrocodone-Acetaminophen] Nausea And Vomiting    Family History: Family History  Problem Relation Age of Onset   COPD Mother    Cancer Father    COPD Father    Heart attack Father     Social History:  reports that she quit smoking about 15 months ago. Her smoking use included cigarettes. She has a 9.00 pack-year smoking history. She has never used smokeless tobacco. She reports that she does not drink alcohol and does not use drugs.  ROS: All other review of systems were reviewed and are negative except what is noted above in HPI  Physical Exam: BP 138/85   Pulse 93   LMP 11/05/2015   Constitutional:  Alert and oriented, No acute distress. HEENT: [place] AT, moist mucus membranes.  Trachea midline, no masses. Cardiovascular: No clubbing, cyanosis, or edema. Respiratory: Normal respiratory effort, no increased work of breathing. GI: Abdomen is soft, nontender, nondistended, no abdominal masses GU: No CVA  tenderness.  Lymph: No cervical or inguinal lymphadenopathy. Skin: No rashes, bruises or suspicious lesions. Neurologic: Grossly intact, no focal deficits, moving all 4 extremities. Psychiatric: Normal mood and affect.  Laboratory Data: Lab Results  Component Value Date   WBC 10.4 03/20/2022   HGB 13.0 03/20/2022   HCT 40.2 03/20/2022   MCV 92.8 03/20/2022   PLT 300 03/20/2022    Lab Results  Component Value Date   CREATININE 0.95 03/20/2022    No results found for: "PSA"  No results found for: "TESTOSTERONE"  Lab Results  Component Value Date   HGBA1C 6.1 04/30/2022    Urinalysis    Component Value Date/Time   COLORURINE AMBER (A) 03/20/2022 0932   APPEARANCEUR CLOUDY (A) 03/20/2022 0932   APPEARANCEUR Cloudy (A) 03/16/2022 1203   LABSPEC 1.015 03/20/2022 0932   PHURINE 8.0 03/20/2022 0932   GLUCOSEU 50 (A) 03/20/2022 0932   HGBUR LARGE (A) 03/20/2022 0932   BILIRUBINUR NEGATIVE 03/20/2022 0932  BILIRUBINUR Negative 03/16/2022 1203   KETONESUR NEGATIVE 03/20/2022 0932   PROTEINUR 100 (A) 03/20/2022 0932   UROBILINOGEN 0.2 02/12/2015 1932   NITRITE NEGATIVE 03/20/2022 0932   LEUKOCYTESUR MODERATE (A) 03/20/2022 0932    Lab Results  Component Value Date   LABMICR See below: 03/16/2022   WBCUA 6-10 (A) 03/16/2022   LABEPIT 0-10 03/16/2022   MUCUS Present 02/10/2022   BACTERIA NONE SEEN 03/20/2022    Pertinent Imaging: *** Results for orders placed during the hospital encounter of 07/02/21  DG Abd 1 View  Narrative CLINICAL DATA:  Left-sided flank pain.  EXAM: ABDOMEN - 1 VIEW  COMPARISON:  Ultrasound renal 07/02/2021, CT renal 10/10/2019  FINDINGS: Right upper quadrant surgical clips. The bowel gas pattern is normal. No radio-opaque calculi or other significant radiographic abnormality are seen.  IMPRESSION: Negative radiograph.  These results and ultrasound renal results from same day will be called to the ordering clinician or  representative by the Radiologist Assistant, and communication documented in the PACS or [employer].   Electronically Signed By: Tish Frederickson M.D. On: 07/03/2021 21:41  No results found for this or any previous visit.  No results found for this or any previous visit.  No results found for this or any previous visit.  Results for orders placed during the hospital encounter of 04/29/22  Ultrasound renal complete  Narrative CLINICAL DATA:  History of nephrolithiasis  EXAM: RENAL / URINARY TRACT ULTRASOUND COMPLETE  COMPARISON:  November 11, 2021  FINDINGS: Right Kidney:  Renal measurements: 12.7 x 7.1 x 5.7 cm = volume: 267 mL. Contains a 9 mm nonobstructive stone.  Left Kidney:  Renal measurements: 13.3 x 6.0 x 6.7 cm = volume: 276 mL. Contains a 14 mm cyst of no clinical significance. No follow-up imaging recommended for the cysts.  Bladder:  Poorly distended limiting evaluation.  No abnormalities identified.  Other:  None.  IMPRESSION: 1. 9 mm nonobstructive stone in the right kidney. 2. The left kidney is unremarkable. 3. The bladder is poorly distended but grossly unremarkable.   Electronically Signed By: Gerome Sam III M.D. On: 04/30/2022 13:44  No results found for this or any previous visit.  No results found for this or any previous visit.  Results for orders placed during the hospital encounter of 03/20/22  CT Renal Stone Study  Narrative CLINICAL DATA:  46 year old female with history of nephrolithiasis and bilateral indwelling double-J nephroureteral stents presenting with lower abdominal pain, urinary frequency, and chills.  EXAM: CT ABDOMEN AND PELVIS WITHOUT CONTRAST  TECHNIQUE: Multidetector CT imaging of the abdomen and pelvis was performed following the standard protocol without IV contrast.  RADIATION DOSE REDUCTION: This exam was performed according to the departmental dose-optimization program which includes  automated exposure control, adjustment of the mA and/or kV according to patient size and/or use of iterative reconstruction technique.  COMPARISON:  03/06/2022  FINDINGS: Lower chest: No acute abnormality.  Hepatobiliary: Marked diffuse decreased attenuation of the hepatic parenchyma. No evidence of hepatic masses. Status post cholecystectomy. No intra or extrahepatic biliary duct dilation.  Pancreas: Unremarkable. No pancreatic ductal dilatation or surrounding inflammatory changes.  Spleen: Normal in size without focal abnormality.  Adrenals/Urinary Tract: The adrenal glands are no in size morphology bilaterally. Bilateral indwelling double-J nephroureteral stents are well positioned, unchanged. There is unchanged mild bilateral hydronephrosis. No evidence of hydroureter. Multifocal punctate bilateral nephrolithiasis, unchanged. The urinary bladder is decompressed.  Stomach/Bowel: Similar appearing small hiatal hernia. Stomach is otherwise within normal limits. Appendix  appears normal. No evidence of bowel wall thickening, distention, or inflammatory changes.  Vascular/Lymphatic: No significant vascular findings are present. No enlarged abdominal or pelvic lymph nodes.  Reproductive: Status post hysterectomy. No adnexal masses.  Other: No abdominal wall hernia or abnormality. No abdominopelvic ascites.  Musculoskeletal: No acute or significant osseous findings.  IMPRESSION: 1. No acute abdominopelvic abnormality. 2. Unchanged mild bilateral hydronephrosis with unchanged position of indwelling bilateral double-J nephroureteral stents. 3. Multifocal punctate bilateral nephrolithiasis, unchanged. 4. Severe hepatic steatosis. 5. Small hiatal hernia.  Marliss Coots, MD  [facility]  [facility]   Electronically Signed By: Marliss Coots M.D. On: 03/20/2022 13:05   Assessment & Plan:    1. Kidney stones -Indapamide  2.5mg  daily -UrocitK BID -Allopurinol 300mg  daily -RTC 2 weeks for BMP and then return in 3 months with a renal - Urinalysis, Routine w reflex microscopic - US RENAL; Future - Basic metabolic panel; Future   No follow-ups on file.  [place], MD  [facility] [place]

## 2022-05-26 ENCOUNTER — Encounter: Payer: Self-pay | Admitting: Urology

## 2022-06-03 ENCOUNTER — Other Ambulatory Visit: Payer: 59

## 2022-06-03 DIAGNOSIS — N200 Calculus of kidney: Secondary | ICD-10-CM | POA: Diagnosis not present

## 2022-06-04 LAB — BASIC METABOLIC PANEL
BUN/Creatinine Ratio: 12 (ref 9–23)
BUN: 10 mg/dL (ref 6–24)
CO2: 24 mmol/L (ref 20–29)
Calcium: 9.5 mg/dL (ref 8.7–10.2)
Chloride: 98 mmol/L (ref 96–106)
Creatinine, Ser: 0.85 mg/dL (ref 0.57–1.00)
Glucose: 151 mg/dL — ABNORMAL HIGH (ref 70–99)
Potassium: 3.6 mmol/L (ref 3.5–5.2)
Sodium: 139 mmol/L (ref 134–144)
eGFR: 85 mL/min/{1.73_m2} (ref >59)

## 2022-06-22 ENCOUNTER — Encounter: Payer: 59 | Attending: Nurse Practitioner | Admitting: Nutrition

## 2022-06-22 ENCOUNTER — Encounter: Payer: 59 | Admitting: Nutrition

## 2022-06-22 ENCOUNTER — Ambulatory Visit: Payer: 59 | Admitting: Nutrition

## 2022-06-22 DIAGNOSIS — R7303 Prediabetes: Secondary | ICD-10-CM | POA: Insufficient documentation

## 2022-06-22 NOTE — Progress Notes (Unsigned)
Medical Nutrition Therapy  Appointment Start time:  1400  Appointment End time:  1500  Primary concerns today: Pre DM and  Morbid Obesity  Referral diagnosis: R73.03, e66.01 Preferred learning style: No preference  Learning readiness: Change in progress    NUTRITION ASSESSMENT  46 yr old female that sees Ronny Bacon, FNP for thyroid issues. Wants to lose weight. Has lost 17 lbs in the last month. Has lost 28 lbs in the last 6+ weeks. Eating more vegetables and has cut down on portions. Taking Trulicity weekly. Has cut out any process foods.. No longer feel bloated. Exercise: going to [facility] twice a week on weekend Doing some stretching exercises at home. Feels like her weight loss  has come to a standstill.  A1C 6.1%.   Anthropometrics  Wt Readings from Last 3 Encounters:  06/22/22 288 lb 12.8 oz (131 kg)  04/30/22 (!) 316 lb (143.3 kg)  04/01/22 (!) 310 lb (140.6 kg)   Ht Readings from Last 3 Encounters:  06/22/22 5\' 6"  (1.676 m)  04/30/22 5' 5.5" (1.664 m)  04/01/22 5' 5.5" (1.664 m)   Body mass index is 46.61 kg/m. @BMIFA @ Facility age limit for growth %iles is 20 years. Facility age limit for growth %iles is 20 years.    Clinical Medical Hx: see chart Medications: see chart Trulicity Labs:  Lab Results  Component Value Date   HGBA1C 6.1 04/30/2022      Latest Ref Rng & Units 06/03/2022    9:25 AM 04/02/2022    8:25 AM 03/20/2022   11:00 AM  CMP  Glucose 70 - 99 mg/dL 06/02/2022   03/22/2022   BUN 6 - 24 mg/dL 10   15   Creatinine 009 - 1.00 mg/dL 381   8.29   Sodium 9.37 - 144 mmol/L 139   138   Potassium 3.5 - 5.2 mmol/L 3.6   3.7   Chloride 96 - 106 mmol/L 98   104   CO2 20 - 29 mmol/L 24   26   Calcium 8.7 - 10.2 mg/dL 9.5  9.0  8.9    Lipid Panel     Component Value Date/Time   CHOL 160 02/06/2022 1043   TRIG 169 (H) 02/06/2022 1043   HDL 40 02/06/2022 1043   CHOLHDL 4.0 02/06/2022 1043   LDLCALC 91 02/06/2022 1043   LABVLDL 29 02/06/2022 1043     Notable Signs/Symptoms: Feels good now  Lifestyle & Dietary Hx Married. Eating 3 meals per day now. Starting to work out at 04/08/2022.  Estimated daily fluid intake: 80 oz Supplements:  Sleep: 8 Stress / self-care: none Current average weekly physical activity: walking   24-Hr Dietary Recall First Meal: Oatmeal or egg, fruit, water Snack:  Second Meal: Small spinach salad no meat, dressing; 04/08/2022 dressing, water Snack:  Third Meal: cauliflower rice with mixed vegetables, water,  Snack:  Beverages: water.  Estimated Energy Needs Calories: 1200 Carbohydrate: 135g Protein: 90g Fat: 33g   NUTRITION DIAGNOSIS  NB-1.1 Food and nutrition-related knowledge deficit As related to Obesity and Pre Dm.  As evidenced by A1C 6.1% and BMI of 46.   NUTRITION INTERVENTION  Nutrition education (E-1) on the following topics:  Lifestyle Medicine  - Whole Food, Plant Predominant Nutrition is highly recommended: Eat Plenty of vegetables, Mushrooms, fruits, Legumes, Whole Grains, Nuts, seeds in lieu of processed meats, processed snacks/pastries red meat, poultry, eggs.    -It is better to avoid simple carbohydrates including: Cakes, Sweet Desserts, Ice Cream, Soda (diet  and regular), Sweet Tea, Candies, Chips, Cookies, Store Bought Juices, Alcohol in Excess of  1-2 drinks a day, Lemonade,  Artificial Sweeteners, Doughnuts, Coffee Creamers, "Sugar-free" Products, etc, etc.  This is not a complete list.....  Exercise: If you are able: 30 -60 minutes a day ,4 days a week, or 150 minutes a week.  The longer the better.  Combine stretch, strength, and aerobic activities.  If you were told in the past that you have high risk for cardiovascular diseases, you may seek evaluation by your heart doctor prior to initiating moderate to intense exercise programs.   Handouts Provided Include  LIfestyle medcine  Learning Style & Readiness for Change Teaching method utilized: Visual & Auditory  Demonstrated  degree of understanding via: Teach Back  Barriers to learning/adherence to lifestyle change: none  Goals Established by Pt Goals  Keep meal planning Continue to eat more plant based boods. Look up [employer] cookbook, and plant based recipes. Go to the fulplateliving.org Exercise to 150 minutes a week. Lose 1 lb per week   MONITORING & EVALUATION Dietary intake, weekly physical activity, and weight in 1 month.  Next Steps  Patient is to work on meal planning of whole plant based meals and exercise.

## 2022-06-22 NOTE — Patient Instructions (Addendum)
Goals  Keep meal planning Continue to eat more plant based boods. Look up [employer] cookbook, and plant based recipes. Go to the fulplateliving.org Exercise to 150 minutes a week. Lose 1 lb per week

## 2022-07-01 ENCOUNTER — Ambulatory Visit: Payer: 59 | Admitting: Nutrition

## 2022-07-22 ENCOUNTER — Ambulatory Visit: Payer: 59 | Admitting: Nutrition

## 2022-08-05 ENCOUNTER — Ambulatory Visit: Payer: 59 | Admitting: Nurse Practitioner

## 2022-08-08 ENCOUNTER — Other Ambulatory Visit: Payer: Self-pay | Admitting: Nurse Practitioner

## 2022-08-08 DIAGNOSIS — R768 Other specified abnormal immunological findings in serum: Secondary | ICD-10-CM

## 2022-08-12 DIAGNOSIS — R768 Other specified abnormal immunological findings in serum: Secondary | ICD-10-CM | POA: Diagnosis not present

## 2022-08-13 LAB — TSH: TSH: 1.31 u[IU]/mL (ref 0.450–4.500)

## 2022-08-13 LAB — T4, FREE: Free T4: 1.54 ng/dL (ref 0.82–1.77)

## 2022-08-18 ENCOUNTER — Ambulatory Visit: Payer: 59 | Admitting: Nutrition

## 2022-08-18 ENCOUNTER — Ambulatory Visit (HOSPITAL_COMMUNITY): Payer: 59

## 2022-08-18 ENCOUNTER — Ambulatory Visit: Payer: 59 | Admitting: Nurse Practitioner

## 2022-08-18 DIAGNOSIS — R7303 Prediabetes: Secondary | ICD-10-CM

## 2022-08-18 DIAGNOSIS — R768 Other specified abnormal immunological findings in serum: Secondary | ICD-10-CM

## 2022-08-24 ENCOUNTER — Ambulatory Visit (HOSPITAL_COMMUNITY)
Admission: RE | Admit: 2022-08-24 | Discharge: 2022-08-24 | Disposition: A | Discharge status: Home / Self Care | Payer: 59 | Source: Ambulatory Visit | Attending: Urology | Admitting: Urology

## 2022-08-24 DIAGNOSIS — N200 Calculus of kidney: Secondary | ICD-10-CM | POA: Diagnosis not present

## 2022-08-24 DIAGNOSIS — N281 Cyst of kidney, acquired: Secondary | ICD-10-CM | POA: Diagnosis not present

## 2022-08-25 ENCOUNTER — Ambulatory Visit: Payer: 59 | Admitting: Urology

## 2022-08-25 DIAGNOSIS — N200 Calculus of kidney: Secondary | ICD-10-CM

## 2022-09-02 ENCOUNTER — Encounter: Payer: 59 | Attending: Nurse Practitioner | Admitting: Nutrition

## 2022-09-02 ENCOUNTER — Encounter: Payer: Self-pay | Admitting: Nurse Practitioner

## 2022-09-02 ENCOUNTER — Ambulatory Visit (INDEPENDENT_AMBULATORY_CARE_PROVIDER_SITE_OTHER): Payer: 59 | Admitting: Nurse Practitioner

## 2022-09-02 ENCOUNTER — Encounter: Payer: Self-pay | Admitting: Nutrition

## 2022-09-02 VITALS — BP 156/77 | HR 94 | Ht 65.0 in | Wt 299.6 lb

## 2022-09-02 VITALS — Ht 65.0 in | Wt 299.0 lb

## 2022-09-02 DIAGNOSIS — R7989 Other specified abnormal findings of blood chemistry: Secondary | ICD-10-CM | POA: Diagnosis not present

## 2022-09-02 DIAGNOSIS — R768 Other specified abnormal immunological findings in serum: Secondary | ICD-10-CM

## 2022-09-02 DIAGNOSIS — R7303 Prediabetes: Secondary | ICD-10-CM | POA: Insufficient documentation

## 2022-09-02 LAB — POCT GLYCOSYLATED HEMOGLOBIN (HGB A1C): Hemoglobin A1C: 5.6 % (ref 4.0–5.6)

## 2022-09-02 MED ORDER — TRULICITY 4.5 MG/0.5ML ~~LOC~~ SOAJ: 4.5000 mg | SUBCUTANEOUS | 3 refills | Status: DC

## 2022-09-02 NOTE — Patient Instructions (Addendum)
Goals  Eat protein with all meals and a fruit with your vegetables.  Choose more dried beans, peas or lentils Lose 1 lb per week. Talk to PCP to be referred for a home sleep study

## 2022-09-02 NOTE — Progress Notes (Signed)
09/02/2022     Endocrinology Follow Up Note    Subjective:    Patient ID: [name], female    DOB: 06-15-1976, PCP Ivy Lynn, NP.   Past Medical History:  Diagnosis Date   Asthma    COPD (chronic obstructive pulmonary disease) ([place])    sees PCP   Depression    GERD (gastroesophageal reflux disease)    History of kidney stones    Hypertension    pt. denies  never taken meds   Pneumonia    PONV (postoperative nausea and vomiting)     Past Surgical History:  Procedure Laterality Date   ABDOMINAL HYSTERECTOMY     CHOLECYSTECTOMY     CYSTOSCOPY W/ RETROGRADES  08/04/2011   Procedure: CYSTOSCOPY WITH RETROGRADE PYELOGRAM;  Surgeon: Marissa Nestle;  Location: [facility];  Service: Urology;  Laterality: Right;  Right Retrograde   CYSTOSCOPY WITH RETROGRADE PYELOGRAM, URETEROSCOPY AND STENT PLACEMENT Left 07/24/2021   Procedure: CYSTOSCOPY WITH RETROGRADE PYELOGRAM, URETEROSCOPY AND STENT PLACEMENT;  Surgeon: Cleon Gustin, MD;  Location: [facility];  Service: Urology;  Laterality: Left;   CYSTOSCOPY WITH RETROGRADE PYELOGRAM, URETEROSCOPY AND STENT PLACEMENT Bilateral 03/02/2022   Procedure: CYSTOSCOPY WITH RETROGRADE PYELOGRAM, RIGHT URETEROSCOPY AND BILATERAL STENT PLACEMENT;  Surgeon: Cleon Gustin, MD;  Location: [facility];  Service: Urology;  Laterality: Bilateral;   CYSTOSCOPY WITH RETROGRADE PYELOGRAM, URETEROSCOPY AND STENT PLACEMENT Bilateral 03/23/2022   Procedure: CYSTOSCOPY WITH RETROGRADE PYELOGRAM, URETEROSCOPY AND STENT REMOVAL;  Surgeon: Cleon Gustin, MD;  Location: [facility];  Service: Urology;  Laterality: Bilateral;   HOLMIUM LASER APPLICATION N/A 4/81/8563   Procedure: HOLMIUM LASER APPLICATION;  Surgeon: Ardis Hughs, MD;  Location: [facility];  Service: Urology;  Laterality: N/A;   HOLMIUM LASER APPLICATION Left 1/49/7026   Procedure: HOLMIUM LASER APPLICATION;  Surgeon: Cleon Gustin, MD;  Location: [facility];  Service: Urology;   Laterality: Left;   HOLMIUM LASER APPLICATION Bilateral 02/04/8587   Procedure: HOLMIUM LASER APPLICATION;  Surgeon: Cleon Gustin, MD;  Location: [facility];  Service: Urology;  Laterality: Bilateral;   LITHOTRIPSY     NEPHROLITHOTOMY Right 04/15/2018   Procedure: RIGHT NEPHROLITHOTOMY PERCUTANEOUS WITH ACCESS;  Surgeon: Ardis Hughs, MD;  Location: [facility];  Service: Urology;  Laterality: Right;   NEPHROLITHOTOMY Right 12/29/2018   Procedure: NEPHROLITHOTOMY PERCUTANEOUS;  Surgeon: Ardis Hughs, MD;  Location: [facility];  Service: Urology;  Laterality: Right;   PERCUTANEOUS NEPHROLITHOTRIPSY  2012   both sides kidney    STONE EXTRACTION WITH BASKET Bilateral 03/02/2022   Procedure: STONE EXTRACTION WITH BASKET;  Surgeon: Cleon Gustin, MD;  Location: [facility];  Service: Urology;  Laterality: Bilateral;   STONE EXTRACTION WITH BASKET Bilateral 03/23/2022   Procedure: STONE EXTRACTION WITH BASKET;  Surgeon: Cleon Gustin, MD;  Location: [facility];  Service: Urology;  Laterality: Bilateral;   TUBAL LIGATION      Social History   Socioeconomic History   Marital status: Single    Spouse name: Not on file   Number of children: Not on file   Years of education: Not on file   Highest education level: Not on file  Occupational History   Not on file  Tobacco Use   Smoking status: Former    Packs/day: 0.50    Years: 18.00    Total pack years: 9.00    Types: Cigarettes    Quit date: 01/21/2021    Years since quitting: 1.6  Smokeless tobacco: Never  Vaping Use   Vaping Use: Never used  Substance and Sexual Activity   Alcohol use: No   Drug use: No   Sexual activity: Yes    Birth control/protection: Surgical  Other Topics Concern   Not on file  Social History Narrative   Not on file   Social Determinants of Health   Financial Resource Strain: Not on file  Food Insecurity: Not on file  Transportation Needs: Not on file  Physical Activity: Not on file  Stress: Not  on file  Social Connections: Not on file    Family History  Problem Relation Age of Onset   COPD Mother    Cancer Father    COPD Father    Heart attack Father     Outpatient Encounter Medications as of 09/02/2022  Medication Sig   allopurinol (ZYLOPRIM) 300 MG tablet Take 1 tablet (300 mg total) by mouth daily.   cetirizine (ZYRTEC) 10 MG tablet Take 10 mg by mouth daily as needed for allergies.   cholecalciferol (VITAMIN D3) 25 MCG (1000 UNIT) tablet Take 1,000 Units by mouth daily.   Cyanocobalamin (B-12 PO) Take 1 tablet by mouth daily.   diazepam (VALIUM) 5 MG tablet Take 1 tablet (5 mg total) by mouth 2 (two) times daily.   Dulaglutide (TRULICITY) 4.5 0000000 SOPN Inject 4.5 mg as directed once a week.   indapamide (LOZOL) 2.5 MG tablet Take 1 tablet (2.5 mg total) by mouth daily.   Multiple Vitamin (MULTIVITAMIN WITH MINERALS) TABS tablet Take 1 tablet by mouth daily.   Potassium Citrate (UROCIT-K 15) 15 MEQ (1620 MG) TBCR Take 1 tablet by mouth in the morning and at bedtime.   [DISCONTINUED] TRULICITY 1.5 0000000 SOPN Inject 1.5 mg into the skin once a week.   ondansetron (ZOFRAN) 4 MG tablet Take 1 tablet (4 mg total) by mouth every 8 (eight) hours as needed for nausea or vomiting. (Patient not taking: Reported on 05/20/2022)   oxybutynin (DITROPAN) 5 MG tablet Take 1 tablet (5 mg total) by mouth 3 (three) times daily. (Patient not taking: Reported on 05/20/2022)   oxyCODONE-acetaminophen (PERCOCET/ROXICET) 5-325 MG tablet Take 1 tablet by mouth every 4 (four) hours as needed for severe pain. (Patient not taking: Reported on 05/20/2022)   No facility-administered encounter medications on file as of 09/02/2022.    ALLERGIES: Allergies  Allergen Reactions   Hydrocodone Nausea And Vomiting   Morphine And Related Hives and Other (See Comments)    Welps   Vicodin [Hydrocodone-Acetaminophen] Nausea And Vomiting    VACCINATION STATUS: Immunization History  Administered Date(s)  Administered   Influenza,inj,Quad PF,6+ Mos 09/11/2014, 09/05/2015, 09/01/2016, 12/30/2018     HPI  [name] is 46 y.o. female who presents today with a medical history as above. she is being seen in follow up after being seen in consultation for hyperthyroidism requested by Ivy Lynn, NP.  she has been dealing with symptoms of progressive weight gain, fatigue, kidney stones (multiple), and constipation, depression, heat intolerance, palpitations and tremors for about a year. These symptoms are progressively worsening and troubling to her.  her most recent thyroid labs revealed suppressed TSH of 0.382 on 02/06/22.  She does note intermittent hoarseness.  she denies dysphagia, choking, shortness of breath.   she does have family history of thyroid dysfunction in her mothers side including some of her sisters and nieces, but denies family hx of thyroid cancer. she denies personal history of goiter. she is not on  any anti-thyroid medications nor on any thyroid hormone supplements. Denies use of Biotin containing supplements other than a womens formulated MVI.  she is willing to proceed with appropriate work up and therapy for thyrotoxicosis.  Review of systems  Constitutional: + Minimally fluctuating body weight,  current Body mass index is 49.86 kg/m. , no fatigue, no subjective hyperthermia, no subjective hypothermia Eyes: no blurry vision, no xerophthalmia ENT: no sore throat, no nodules palpated in throat, no dysphagia/odynophagia, no hoarseness Cardiovascular: no chest pain, no shortness of breath, no palpitations, no leg swelling Respiratory: no cough, no shortness of breath Gastrointestinal: no nausea/vomiting/diarrhea Musculoskeletal: no muscle/joint aches Skin: no rashes, no hyperemia Neurological: no tremors, no numbness, no tingling, no dizziness Psychiatric: + depression-regarding inability to lose weight, no anxiety   Objective:    BP (!) 156/77 (BP Location:  Right Arm, Patient Position: Sitting, Cuff Size: Large)   Pulse 94   Ht 5\' 5"  (1.651 m)   Wt 299 lb 9.6 oz (135.9 kg)   LMP 11/05/2015   BMI 49.86 kg/m   Wt Readings from Last 3 Encounters:  09/02/22 299 lb 9.6 oz (135.9 kg)  06/22/22 288 lb 12.8 oz (131 kg)  04/30/22 (!) 316 lb (143.3 kg)     BP Readings from Last 3 Encounters:  09/02/22 (!) 156/77  05/20/22 138/85  04/30/22 (!) 141/85                          Physical Exam- Limited  Constitutional:  Body mass index is 49.86 kg/m. , not in acute distress, normal state of mind Eyes:  EOMI, no exophthalmos Neck: Supple Cardiovascular: RRR, no murmurs, rubs, or gallops, no edema Respiratory: Adequate breathing efforts, no crackles, rales, rhonchi, or wheezing Musculoskeletal: no gross deformities, strength intact in all four extremities, no gross restriction of joint movements Skin:  no rashes, no hyperemia Neurological: no tremor with outstretched hands   CMP     Component Value Date/Time   NA 139 06/03/2022 0925   K 3.6 06/03/2022 0925   CL 98 06/03/2022 0925   CO2 24 06/03/2022 0925   GLUCOSE 151 (H) 06/03/2022 0925   GLUCOSE 122 (H) 03/20/2022 1100   BUN 10 06/03/2022 0925   CREATININE 0.85 06/03/2022 0925   CALCIUM 9.5 06/03/2022 0925   PROT 7.2 03/07/2022 0604   PROT 7.3 02/06/2022 1043   ALBUMIN 3.3 (L) 03/07/2022 0604   ALBUMIN 3.9 02/06/2022 1043   AST 18 03/07/2022 0604   ALT 27 03/07/2022 0604   ALKPHOS 72 03/07/2022 0604   BILITOT 0.8 03/07/2022 0604   BILITOT 0.2 02/06/2022 1043   GFRNONAA >60 03/20/2022 1100   GFRAA >60 10/12/2019 0745     CBC    Component Value Date/Time   WBC 10.4 03/20/2022 1100   RBC 4.33 03/20/2022 1100   HGB 13.0 03/20/2022 1100   HGB 13.9 02/06/2022 1043   HCT 40.2 03/20/2022 1100   HCT 41.6 02/06/2022 1043   PLT 300 03/20/2022 1100   PLT 304 02/06/2022 1043   MCV 92.8 03/20/2022 1100   MCV 91 02/06/2022 1043   MCH 30.0 03/20/2022 1100   MCHC 32.3 03/20/2022  1100   RDW 12.9 03/20/2022 1100   RDW 12.6 02/06/2022 1043   LYMPHSABS 3.0 03/20/2022 1100   LYMPHSABS 2.6 02/06/2022 1043   MONOABS 0.7 03/20/2022 1100   EOSABS 0.1 03/20/2022 1100   EOSABS 0.1 02/06/2022 1043   BASOSABS 0.0 03/20/2022 1100  BASOSABS 0.1 02/06/2022 1043     Diabetic Labs (most recent): Lab Results  Component Value Date   HGBA1C 5.6 09/02/2022   HGBA1C 6.1 04/30/2022   HGBA1C  01/20/2008    5.6 (NOTE)   The ADA recommends the following therapeutic goals for glycemic   control related to Hgb A1C measurement:   Goal of Therapy:   < 7.0% Hgb A1C   Action Suggested:  > 8.0% Hgb A1C   Ref:  Diabetes Care, 22, Suppl. 1, 1999    Lipid Panel     Component Value Date/Time   CHOL 160 02/06/2022 1043   TRIG 169 (H) 02/06/2022 1043   HDL 40 02/06/2022 1043   CHOLHDL 4.0 02/06/2022 1043   LDLCALC 91 02/06/2022 1043   LABVLDL 29 02/06/2022 1043     Lab Results  Component Value Date   TSH 1.310 08/12/2022   TSH 0.723 04/02/2022   TSH 0.382 (L) 02/06/2022   FREET4 1.54 08/12/2022   FREET4 1.23 04/02/2022   FREET4 CANCELED 02/06/2022     Latest Reference Range & Units 02/06/22 10:43 04/02/22 08:25 08/12/22 08:28  TSH 0.450 - 4.500 uIU/mL 0.382 (L) 0.723 1.310  Triiodothyronine,Free,Serum 2.0 - 4.4 pg/mL  3.4   T4,Free(Direct) 0.82 - 1.77 ng/dL CANCELED 1.23 1.54  Thyroperoxidase Ab SerPl-aCnc 0 - 34 IU/mL  16   Thyroglobulin Antibody 0.0 - 0.9 IU/mL  3.1 (H)   (L): Data is abnormally low (H): Data is abnormally high    Assessment & Plan:   1. Thyroid antibody positive  Her repeat thyroid function tests show positive antibodies indicating autoimmune thyroid dysfunction.  However, based on her current labs, she is still in euthyroid presentation.  She will need routine follow up to determine if/when additional thyroid testing and intervention will be needed.  Will recheck TFTs prior to next visit for surveillance.  2. Prediabetes  Her A1c today is 5.6%,  improving from 6.1% last visit.  She has had positive outcomes with starting Trulicity.  Will increase to 4.5 mg SQ weekly to assist in further weight loss attempts.  She is following a predominantly plant based, whole grain diet, just struggles with physical exercise due to her long hours at work.  The following Lifestyle Medicine recommendations according to [facility]) were discussed and offered to patient and she agrees to start the journey:  A. Whole Foods, Plant-based plate comprising of fruits and vegetables, plant-based proteins, whole-grain carbohydrates was discussed in detail with the patient.   A list for source of those nutrients were also provided to the patient.  Patient will use only water or unsweetened tea for hydration. B.  The need to stay away from risky substances including alcohol, smoking; obtaining 7 to 9 hours of restorative sleep, at least 150 minutes of moderate intensity exercise weekly, the importance of healthy social connections,  and stress reduction techniques were discussed. C.  A full color page of  Calorie density of various food groups per pound showing examples of each food groups was provided to the patient.  - Nutritional counseling repeated at each appointment due to patients tendency to fall back in to old habits.  - The patient admits there is a room for improvement in their diet and drink choices. -  Suggestion is made for the patient to avoid simple carbohydrates from their diet including Cakes, Sweet Desserts / Pastries, Ice Cream, Soda (diet and regular), Sweet Tea, Candies, Chips, Cookies, Sweet Pastries, Store Bought Juices, Alcohol  in Excess of 1-2 drinks a day, Artificial Sweeteners, Coffee Creamer, and "Sugar-free" Products. This will help patient to have stable blood glucose profile and potentially avoid unintended weight gain.   - I encouraged the patient to switch to unprocessed or minimally processed complex starch  and increased protein intake (animal or plant source), fruits, and vegetables.   - Patient is advised to stick to a routine mealtimes to eat 3 meals a day and avoid unnecessary snacks (to snack only to correct hypoglycemia).    -Patient is advised to maintain close follow up with Ivy Lynn, NP for primary care needs.  I did check PTH given her history of kidney stones to rule out parathyroid involvement, labs were normal.  I also checked am cortisol level to assess adrenal function which was normal as well.     I spent 32 minutes in the care of the patient today including review of labs from [place], Lipids, Thyroid Function, Hematology (current and previous including abstractions from other facilities); face-to-face time discussing  her blood glucose readings/logs, discussing hypoglycemia and hyperglycemia episodes and symptoms, medications doses, her options of short and long term treatment based on the latest standards of care / guidelines;  discussion about incorporating lifestyle medicine;  and documenting the encounter. Risk reduction counseling performed per USPSTF guidelines to reduce obesity and cardiovascular risk factors.     Please refer to Patient Instructions for Blood Glucose Monitoring and Insulin/Medications Dosing Guide"  in media tab for additional information. Please  also refer to " Patient Self Inventory" in the Media  tab for reviewed elements of pertinent patient history.  [name] participated in the discussions, expressed understanding, and voiced agreement with the above plans.  All questions were answered to her satisfaction. she is encouraged to contact clinic should she have any questions or concerns prior to her return visit.  Follow up plan: Return in about 3 months (around 12/03/2022) for Diabetes F/U with A1c in office, Thyroid follow up, Previsit labs.   Thank you for involving me in the care of this pleasant patient, and I will continue to update  you with her progress.    Rayetta Pigg, [facility] [address] [place], [place] [address] Phone: [phone] Fax: [phone]  09/02/2022, 10:08 AM

## 2022-09-02 NOTE — Progress Notes (Signed)
Medical Nutrition Therapy  Appointment Start time:  1010 Appointment End time:  15  Primary concerns today: Pre DM and  Morbid Obesity  Referral diagnosis: R73.03, e66.01 Preferred learning style: No preference  Learning readiness: Change in progress    NUTRITION ASSESSMENT  46 yr old female that sees Rayetta Pigg, [place] for thyroid issues and weight. She is frustrated because she hasn't lost any weight. Loree Fee is increasing her Trulicity to 4.5 g/weekly. Changes made: cut out red and processed meat, chips, sodas and junk food. Is eating more fruit, vegetables.  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 Complains of chronic fatigue.  She is exhausted when she goes to bed and gets up tired in the am. Typically gets up at 430 and works til 8 pm at night. Gets up 5 times at night to use the bathroom even though she has stopped drinking liquids at 7 pm. Doesn't eat after 7 pm either. Is only drinking water. Error in previous weight was 199 lbs and not 188 lbs. Her A1C has improved a lot down to 5.6% from 6.1%.  Current diet may be too restrictive in Protein and carbohydrates to provide desired weight loss according to her food recall.  She would like to be referred to the sleep center to be evaluated for sleep apnea.  Anthropometrics  Wt Readings from Last 3 Encounters:  09/02/22 299 lb 9.6 oz (135.9 kg)  06/22/22 288 lb 12.8 oz (131 kg)  04/30/22 (!) 316 lb (143.3 kg)   Ht Readings from Last 3 Encounters:  09/02/22 5\' 5"  (1.651 m)  06/22/22 5\' 6"  (1.676 m)  04/30/22 5' 5.5" (1.664 m)   There is no height or weight on file to calculate BMI. @BMIFA @ Facility age limit for growth %iles is 20 years. Facility age limit for growth %iles is 20 years.    Clinical Medical Hx: see chart Medications: see chart Trulicity Labs:  Lab Results  Component Value Date   HGBA1C 5.6 09/02/2022      Latest Ref Rng & Units 06/03/2022    9:25  AM 04/02/2022    8:25 AM 03/20/2022   11:00 AM  CMP  Glucose 70 - 99 mg/dL 151   122   BUN 6 - 24 mg/dL 10   15   Creatinine 0.57 - 1.00 mg/dL 0.85   0.95   Sodium 134 - 144 mmol/L 139   138   Potassium 3.5 - 5.2 mmol/L 3.6   3.7   Chloride 96 - 106 mmol/L 98   104   CO2 20 - 29 mmol/L 24   26   Calcium 8.7 - 10.2 mg/dL 9.5  9.0  8.9    Lipid Panel     Component Value Date/Time   CHOL 160 02/06/2022 1043   TRIG 169 (H) 02/06/2022 1043   HDL 40 02/06/2022 1043   CHOLHDL 4.0 02/06/2022 1043   LDLCALC 91 02/06/2022 1043   LABVLDL 29 02/06/2022 1043    Notable Signs/Symptoms: Feels good now  Lifestyle & Dietary Hx Married. Eating 3 meals per day now. Starting to work out at [employer].  Estimated daily fluid intake: 80 oz Supplements:  Sleep: Doesn't get good sleep. Sleeps only  a few hours a time. Gets up a lot to use bathroom. Stress / self-care: none Current average weekly physical activity: walking   24-Hr Dietary Recall First Meal: 1 cup dry oatmeal, banana. Snack:  Second Meal:  Toss salad, cucumbers, tomato, onions and pepper, FF thousand island dressing, water Snack:  Third Meal: [place] style green beans, creamed style corn, water Snack:  Beverages: water.  Estimated Energy Needs Calories: 1200 Carbohydrate: 135g Protein: 90g Fat: 33g   NUTRITION DIAGNOSIS  NB-1.1 Food and nutrition-related knowledge deficit As related to Obesity and Pre Dm.  As evidenced by A1C 6.1% and BMI of 46.   NUTRITION INTERVENTION  Nutrition education (E-1) on the following topics:  Lifestyle Medicine  - Whole Food, Plant Predominant Nutrition is highly recommended: Eat Plenty of vegetables, Mushrooms, fruits, Legumes, Whole Grains, Nuts, seeds in lieu of processed meats, processed snacks/pastries red meat, poultry, eggs.    -It is better to avoid simple carbohydrates including: Cakes, Sweet Desserts, Ice Cream, Soda (diet and regular), Sweet Tea, Candies, Chips, Cookies, Store Bought  Juices, Alcohol in Excess of  1-2 drinks a day, Lemonade,  Artificial Sweeteners, Doughnuts, Coffee Creamers, "Sugar-free" Products, etc, etc.  This is not a complete list.....  Exercise: If you are able: 30 -60 minutes a day ,4 days a week, or 150 minutes a week.  The longer the better.  Combine stretch, strength, and aerobic activities.  If you were told in the past that you have high risk for cardiovascular diseases, you may seek evaluation by your heart doctor prior to initiating moderate to intense exercise programs.   Handouts Provided Include  LIfestyle medcine  Learning Style & Readiness for Change Teaching method utilized: Visual & Auditory  Demonstrated degree of understanding via: Teach Back  Barriers to learning/adherence to lifestyle change: none  Goals Established by Pt  Eat protein with all meals and a fruit with your vegetables.  Choose more dried beans, peas or lentils Lose 1 lb per week. Talk to PCP to be referred for a home sleep study   MONITORING & EVALUATION Dietary intake, weekly physical activity, and weight in 3 month. Recommend referral to sleep study. Next Steps  Patient is to work on meal planning of whole plant based meals and exercise.

## 2022-09-08 ENCOUNTER — Encounter: Payer: Self-pay | Admitting: Urology

## 2022-09-08 ENCOUNTER — Ambulatory Visit (INDEPENDENT_AMBULATORY_CARE_PROVIDER_SITE_OTHER): Payer: 59 | Admitting: Urology

## 2022-09-08 VITALS — BP 138/84 | HR 97

## 2022-09-08 DIAGNOSIS — N200 Calculus of kidney: Secondary | ICD-10-CM | POA: Diagnosis not present

## 2022-09-08 LAB — URINALYSIS, ROUTINE W REFLEX MICROSCOPIC
Bilirubin, UA: NEGATIVE
Glucose, UA: NEGATIVE
Ketones, UA: NEGATIVE
Leukocytes,UA: NEGATIVE
Nitrite, UA: NEGATIVE
Protein,UA: NEGATIVE
Specific Gravity, UA: 1.02 (ref 1.005–1.030)
Urobilinogen, Ur: 0.2 mg/dL (ref 0.2–1.0)
pH, UA: 7 (ref 5.0–7.5)

## 2022-09-08 LAB — MICROSCOPIC EXAMINATION

## 2022-09-08 NOTE — Progress Notes (Signed)
09/08/2022 9:17 AM   [name] 24-Mar-1976 [id number]  Referring provider: Ivy Lynn, NP [address] [place],  [place] [address]  No chief complaint on file.   HPI: [name] is a 46yo here for followup for nephrolithiasis. She is on indapamide 2.88m, UrocitK 133m BID and allopurinol. Renal [place]/25 showed new bilateral renal calculi. She has intermittent right flank pain. No stone events since last visit. Urine pH 7.0   PMH: Past Medical History:  Diagnosis Date   Asthma    COPD (chronic obstructive pulmonary disease) (HCC)    sees PCP   Depression    GERD (gastroesophageal reflux disease)    History of kidney stones    Hypertension    pt. denies  never taken meds   Pneumonia    PONV (postoperative nausea and vomiting)     Surgical History: Past Surgical History:  Procedure Laterality Date   ABDOMINAL HYSTERECTOMY     CHOLECYSTECTOMY     CYSTOSCOPY W/ RETROGRADES  08/04/2011   Procedure: CYSTOSCOPY WITH RETROGRADE PYELOGRAM;  Surgeon: MoMarissa Nestle Location: [facility];  Service: Urology;  Laterality: Right;  Right Retrograde   CYSTOSCOPY WITH RETROGRADE PYELOGRAM, URETEROSCOPY AND STENT PLACEMENT Left 07/24/2021   Procedure: CYSTOSCOPY WITH RETROGRADE PYELOGRAM, URETEROSCOPY AND STENT PLACEMENT;  Surgeon: McCleon GustinMD;  Location: [facility];  Service: Urology;  Laterality: Left;   CYSTOSCOPY WITH RETROGRADE PYELOGRAM, URETEROSCOPY AND STENT PLACEMENT Bilateral 03/02/2022   Procedure: CYSTOSCOPY WITH RETROGRADE PYELOGRAM, RIGHT URETEROSCOPY AND BILATERAL STENT PLACEMENT;  Surgeon: McCleon GustinMD;  Location: [facility];  Service: Urology;  Laterality: Bilateral;   CYSTOSCOPY WITH RETROGRADE PYELOGRAM, URETEROSCOPY AND STENT PLACEMENT Bilateral 03/23/2022   Procedure: CYSTOSCOPY WITH RETROGRADE PYELOGRAM, URETEROSCOPY AND STENT REMOVAL;  Surgeon: McCleon GustinMD;  Location: [facility];  Service: Urology;  Laterality: Bilateral;   HOLMIUM  LASER APPLICATION N/A 04/05/85/7672 Procedure: HOLMIUM LASER APPLICATION;  Surgeon: HeArdis HughsMD;  Location: [facility];  Service: Urology;  Laterality: N/A;   HOLMIUM LASER APPLICATION Left 8/0/94/7096 Procedure: HOLMIUM LASER APPLICATION;  Surgeon: McCleon GustinMD;  Location: [facility];  Service: Urology;  Laterality: Left;   HOLMIUM LASER APPLICATION Bilateral 4/01/07/3661 Procedure: HOLMIUM LASER APPLICATION;  Surgeon: McCleon GustinMD;  Location: [facility];  Service: Urology;  Laterality: Bilateral;   LITHOTRIPSY     NEPHROLITHOTOMY Right 04/15/2018   Procedure: RIGHT NEPHROLITHOTOMY PERCUTANEOUS WITH ACCESS;  Surgeon: HeArdis HughsMD;  Location: [facility];  Service: Urology;  Laterality: Right;   NEPHROLITHOTOMY Right 12/29/2018   Procedure: NEPHROLITHOTOMY PERCUTANEOUS;  Surgeon: HeArdis HughsMD;  Location: [facility];  Service: Urology;  Laterality: Right;   PERCUTANEOUS NEPHROLITHOTRIPSY  2012   both sides kidney    STONE EXTRACTION WITH BASKET Bilateral 03/02/2022   Procedure: STONE EXTRACTION WITH BASKET;  Surgeon: McCleon GustinMD;  Location: [facility];  Service: Urology;  Laterality: Bilateral;   STONE EXTRACTION WITH BASKET Bilateral 03/23/2022   Procedure: STONE EXTRACTION WITH BASKET;  Surgeon: McCleon GustinMD;  Location: [facility];  Service: Urology;  Laterality: Bilateral;   TUBAL LIGATION      Home Medications:  Allergies as of 09/08/2022       Reactions   Hydrocodone Nausea And Vomiting   Morphine And Related Hives, Other (See Comments)   Welps   Vicodin [hydrocodone-acetaminophen] Nausea And Vomiting        Medication List  Accurate as of September 08, 2022  9:17 AM. If you have any questions, ask your nurse or doctor.          allopurinol 300 MG tablet Commonly known as: ZYLOPRIM Take 1 tablet (300 mg total) by mouth daily.   B-12 PO Take 1 tablet by mouth daily.   cetirizine 10 MG tablet Commonly known as:  ZYRTEC Take 10 mg by mouth daily as needed for allergies.   cholecalciferol 25 MCG (1000 UNIT) tablet Commonly known as: VITAMIN D3 Take 1,000 Units by mouth daily.   diazepam 5 MG tablet Commonly known as: VALIUM Take 1 tablet (5 mg total) by mouth 2 (two) times daily.   indapamide 2.5 MG tablet Commonly known as: LOZOL Take 1 tablet (2.5 mg total) by mouth daily.   multivitamin with minerals Tabs tablet Take 1 tablet by mouth daily.   ondansetron 4 MG tablet Commonly known as: Zofran Take 1 tablet (4 mg total) by mouth every 8 (eight) hours as needed for nausea or vomiting.   oxybutynin 5 MG tablet Commonly known as: DITROPAN Take 1 tablet (5 mg total) by mouth 3 (three) times daily.   oxyCODONE-acetaminophen 5-325 MG tablet Commonly known as: PERCOCET/ROXICET Take 1 tablet by mouth every 4 (four) hours as needed for severe pain.   Potassium Citrate 15 MEQ (1620 MG) Tbcr Commonly known as: Urocit-K 15 Take 1 tablet by mouth in the morning and at bedtime.   Trulicity 4.5 NL/9.7QB Sopn Generic drug: Dulaglutide Inject 4.5 mg as directed once a week.        Allergies:  Allergies  Allergen Reactions   Hydrocodone Nausea And Vomiting   Morphine And Related Hives and Other (See Comments)    Welps   Vicodin [Hydrocodone-Acetaminophen] Nausea And Vomiting    Family History: Family History  Problem Relation Age of Onset   COPD Mother    Cancer Father    COPD Father    Heart attack Father     Social History:  reports that she quit smoking about 19 months ago. Her smoking use included cigarettes. She has a 9.00 pack-year smoking history. She has never used smokeless tobacco. She reports that she does not drink alcohol and does not use drugs.  ROS: All other review of systems were reviewed and are negative except what is noted above in HPI  Physical Exam: BP 138/84   Pulse 97   LMP 11/05/2015   Constitutional:  Alert and oriented, No acute distress. HEENT:  [place] AT, moist mucus membranes.  Trachea midline, no masses. Cardiovascular: No clubbing, cyanosis, or edema. Respiratory: Normal respiratory effort, no increased work of breathing. GI: Abdomen is soft, nontender, nondistended, no abdominal masses GU: No CVA tenderness.  Lymph: No cervical or inguinal lymphadenopathy. Skin: No rashes, bruises or suspicious lesions. Neurologic: Grossly intact, no focal deficits, moving all 4 extremities. Psychiatric: Normal mood and affect.  Laboratory Data: Lab Results  Component Value Date   WBC 10.4 03/20/2022   HGB 13.0 03/20/2022   HCT 40.2 03/20/2022   MCV 92.8 03/20/2022   PLT 300 03/20/2022    Lab Results  Component Value Date   CREATININE 0.85 06/03/2022    No results found for: "PSA"  No results found for: "TESTOSTERONE"  Lab Results  Component Value Date   HGBA1C 5.6 09/02/2022    Urinalysis    Component Value Date/Time   COLORURINE AMBER (A) 03/20/2022 0932   APPEARANCEUR Clear 05/20/2022 1351   LABSPEC 1.015 03/20/2022 0932  PHURINE 8.0 03/20/2022 0932   GLUCOSEU Negative 05/20/2022 1351   HGBUR LARGE (A) 03/20/2022 0932   BILIRUBINUR Negative 05/20/2022 1351   KETONESUR NEGATIVE 03/20/2022 0932   PROTEINUR Negative 05/20/2022 1351   PROTEINUR 100 (A) 03/20/2022 0932   UROBILINOGEN 0.2 02/12/2015 1932   NITRITE Negative 05/20/2022 1351   NITRITE NEGATIVE 03/20/2022 0932   LEUKOCYTESUR Negative 05/20/2022 1351   LEUKOCYTESUR MODERATE (A) 03/20/2022 0932    Lab Results  Component Value Date   LABMICR Comment 05/20/2022   WBCUA 6-10 (A) 03/16/2022   LABEPIT 0-10 03/16/2022   MUCUS Present 02/10/2022   BACTERIA NONE SEEN 03/20/2022    Pertinent Imaging: Renal 08/24/2022: Images reviewed and discussed with the patient Results for orders placed during the hospital encounter of 07/02/21  DG Abd 1 View  Narrative CLINICAL DATA:  Left-sided flank pain.  EXAM: ABDOMEN - 1 VIEW  COMPARISON:  Ultrasound renal  07/02/2021, CT renal 10/10/2019  FINDINGS: Right upper quadrant surgical clips. The bowel gas pattern is normal. No radio-opaque calculi or other significant radiographic abnormality are seen.  IMPRESSION: Negative radiograph.  These results and ultrasound renal results from same day will be called to the ordering clinician or representative by the Radiologist Assistant, and communication documented in the PACS or [employer].   Electronically Signed By: Iven Finn M.D. On: 07/03/2021 21:41  No results found for this or any previous visit.  No results found for this or any previous visit.  No results found for this or any previous visit.  Results for orders placed during the hospital encounter of 08/24/22  US RENAL  Narrative CLINICAL DATA:  Nephrolithiasis  EXAM: RENAL / URINARY TRACT ULTRASOUND COMPLETE  COMPARISON:  CT scans of the abdomen and pelvis dated March 08, 2020, May 13, 2020, and March 06, 2022. Multiple ultrasounds of the kidneys since October 12, 2019  FINDINGS: Right Kidney:  Renal measurements: 11.6 x 4.9 x 5.7 cm = volume: 167 mL. Contains multiple shadowing foci with the largest measuring 8.6 mm. No hydronephrosis.  Left Kidney:  Renal measurements: 12.0 x 5.1 x 6.0 cm = volume: 192 mL. Contains an 8.9 mm stone in the lower pole. There is mild caliectasis, unchanged compared to the August 28, 2021 study but not appreciated on the Apr 29, 2022 and November 11, 2021 studies. There is a hypoechoic mass exophytic off the lower pole of the left kidney measuring 2.1 x 1.4 by 1.5 cm today. There is no internal blood flow within this mass. This mass measured 1.7 x 1.0 x 1.3 cm previously. This mass is nonspecific on today's study but met the criteria for a cyst on previous CT scans since Apr 16, 2018. The mass demonstrated a maximum dimension of 1.7 cm on the Apr 16, 2018 CT scan and demonstrated an attenuation of less than 10  Hounsfield units. No follow-up imaging recommended for this cyst.  Bladder:  Appears normal for degree of bladder distention.  Other:  None.  IMPRESSION: 1. A bilateral renal stones. No hydronephrosis on the right. Mild caliectasis on the left is unchanged compared to the August 28, 2021 ultrasound but not appreciated on the November 11, 2021 and Apr 29, 2022 ultrasounds. 2. The bladder is unremarkable. 3. No other abnormalities.   Electronically Signed By: Dorise Bullion III M.D. On: 08/25/2022 08:11  No valid procedures specified. No results found for this or any previous visit.  Results for orders placed during the hospital encounter of 03/20/22  CT Renal  Stone Study  Narrative CLINICAL DATA:  46 year old female with history of nephrolithiasis and bilateral indwelling double-J nephroureteral stents presenting with lower abdominal pain, urinary frequency, and chills.  EXAM: CT ABDOMEN AND PELVIS WITHOUT CONTRAST  TECHNIQUE: Multidetector CT imaging of the abdomen and pelvis was performed following the standard protocol without IV contrast.  RADIATION DOSE REDUCTION: This exam was performed according to the departmental dose-optimization program which includes automated exposure control, adjustment of the mA and/or kV according to patient size and/or use of iterative reconstruction technique.  COMPARISON:  03/06/2022  FINDINGS: Lower chest: No acute abnormality.  Hepatobiliary: Marked diffuse decreased attenuation of the hepatic parenchyma. No evidence of hepatic masses. Status post cholecystectomy. No intra or extrahepatic biliary duct dilation.  Pancreas: Unremarkable. No pancreatic ductal dilatation or surrounding inflammatory changes.  Spleen: Normal in size without focal abnormality.  Adrenals/Urinary Tract: The adrenal glands are no in size morphology bilaterally. Bilateral indwelling double-J nephroureteral stents are well positioned,  unchanged. There is unchanged mild bilateral hydronephrosis. No evidence of hydroureter. Multifocal punctate bilateral nephrolithiasis, unchanged. The urinary bladder is decompressed.  Stomach/Bowel: Similar appearing small hiatal hernia. Stomach is otherwise within normal limits. Appendix appears normal. No evidence of bowel wall thickening, distention, or inflammatory changes.  Vascular/Lymphatic: No significant vascular findings are present. No enlarged abdominal or pelvic lymph nodes.  Reproductive: Status post hysterectomy. No adnexal masses.  Other: No abdominal wall hernia or abnormality. No abdominopelvic ascites.  Musculoskeletal: No acute or significant osseous findings.  IMPRESSION: 1. No acute abdominopelvic abnormality. 2. Unchanged mild bilateral hydronephrosis with unchanged position of indwelling bilateral double-J nephroureteral stents. 3. Multifocal punctate bilateral nephrolithiasis, unchanged. 4. Severe hepatic steatosis. 5. Small hiatal hernia.  Ruthann Cancer, MD  [facility]  [facility]   Electronically Signed By: Ruthann Cancer M.D. On: 03/20/2022 13:05   Assessment & Plan:    1. Kidney stones CT stone study. - Urinalysis, Routine w reflex microscopic   No follow-ups on file.  Nicolette Bang, MD  [facility] [place]

## 2022-09-08 NOTE — Patient Instructions (Signed)

## 2022-09-15 ENCOUNTER — Telehealth: Payer: Self-pay

## 2022-09-15 DIAGNOSIS — N200 Calculus of kidney: Secondary | ICD-10-CM

## 2022-09-15 MED ORDER — ONDANSETRON HCL 4 MG PO TABS: 4.0000 mg | ORAL_TABLET | Freq: Three times a day (TID) | ORAL | 0 refills | Status: DC | PRN

## 2022-09-15 NOTE — Telephone Encounter (Signed)
Per Dr. Alyson Ingles ok to send in Zofran for nausea. Patient called and made aware.

## 2022-09-15 NOTE — Telephone Encounter (Signed)
Patient called advising that she is nauseous and having right and left flank pain and back pain. She advised she needed more pain medication and if possible nausea medication. I advised patient that Dr. Alyson Ingles is in surgery today and the pain medication if prescribed would be sent in once he in in office.  Patient is currently waiting for her CT to be approved.       Pharmacy: [place], [place]

## 2022-09-16 ENCOUNTER — Other Ambulatory Visit: Payer: Self-pay

## 2022-09-16 ENCOUNTER — Ambulatory Visit
Admission: RE | Admit: 2022-09-16 | Discharge: 2022-09-16 | Disposition: A | Discharge status: Home / Self Care | Payer: 59 | Source: Ambulatory Visit | Attending: Urology | Admitting: Urology

## 2022-09-16 DIAGNOSIS — N132 Hydronephrosis with renal and ureteral calculous obstruction: Secondary | ICD-10-CM | POA: Diagnosis not present

## 2022-09-16 DIAGNOSIS — K760 Fatty (change of) liver, not elsewhere classified: Secondary | ICD-10-CM | POA: Diagnosis not present

## 2022-09-16 DIAGNOSIS — K449 Diaphragmatic hernia without obstruction or gangrene: Secondary | ICD-10-CM | POA: Diagnosis not present

## 2022-09-16 DIAGNOSIS — K5730 Diverticulosis of large intestine without perforation or abscess without bleeding: Secondary | ICD-10-CM | POA: Diagnosis not present

## 2022-09-16 MED ORDER — OXYCODONE-ACETAMINOPHEN 5-325 MG PO TABS: 1.0000 | ORAL_TABLET | ORAL | 0 refills | Status: DC | PRN

## 2022-09-30 ENCOUNTER — Ambulatory Visit (INDEPENDENT_AMBULATORY_CARE_PROVIDER_SITE_OTHER): Payer: 59 | Admitting: Urology

## 2022-09-30 ENCOUNTER — Encounter: Payer: Self-pay | Admitting: Urology

## 2022-09-30 VITALS — BP 142/85 | HR 98

## 2022-09-30 DIAGNOSIS — N201 Calculus of ureter: Secondary | ICD-10-CM

## 2022-09-30 DIAGNOSIS — N200 Calculus of kidney: Secondary | ICD-10-CM

## 2022-09-30 LAB — MICROSCOPIC EXAMINATION: RBC, Urine: NONE SEEN /hpf (ref 0–2)

## 2022-09-30 LAB — URINALYSIS, ROUTINE W REFLEX MICROSCOPIC
Bilirubin, UA: NEGATIVE
Glucose, UA: NEGATIVE
Ketones, UA: NEGATIVE
Leukocytes,UA: NEGATIVE
Nitrite, UA: NEGATIVE
Specific Gravity, UA: 1.02 (ref 1.005–1.030)
Urobilinogen, Ur: 0.2 mg/dL (ref 0.2–1.0)
pH, UA: 6.5 (ref 5.0–7.5)

## 2022-09-30 NOTE — Patient Instructions (Signed)
Ureteroscopy  Ureteroscopy is a procedure to check for and treat problems inside part of the urinary tract. In this procedure, a long rigid or flexible tube with a lens and light at the end (ureteroscope) is used to look at the inside of the kidneys and the ureters. The ureters are the tubes that carry urine from the kidneys to the bladder. The ureteroscope is inserted into one or both of the ureters. You may need this procedure if you have frequent urinary tract infections (UTIs), blood in your urine, or a stone in one or both of your ureters. A ureteroscopy can be done: To find the cause of urine blockage in a ureter and to evaluate other abnormalities inside the ureters or kidneys. To remove stones. To remove or treat growths of tissue (polyps), abnormal tissue, and some types of tumors. To remove a tissue sample and check it for disease under a microscope (biopsy). Tell a health care provider about: Any allergies you have. All medicines you are taking, including vitamins, herbs, eye drops, creams, and over-the-counter medicines. Any problems you or family members have had with anesthetic medicines. Any bleeding problems you have. Any surgeries you have had. Any medical conditions you have. Whether you are pregnant or may be pregnant. What are the risks? Your health care provider will talk with you about risks. These may include: Abdominal pain or a burning feeling or pain while urinating. Abnormal bleeding. A UTI. Allergic reactions to medicines. Scarring that narrows the ureter (stricture) or swelling. Creating a hole (perforation) in the ureter. Damage to other structures or organs, such as the part of your body that drains urine from your bladder (urethra), your bladder, or your uterus. What happens before the procedure? When to stop eating and drinking  8 hours before your procedure Stop eating most foods. Do not eat meat, fried foods, or fatty foods. Eat only light foods, such  as toast or crackers. All liquids are okay except energy drinks and alcohol. 6 hours before your procedure Stop eating. Drink only clear liquids, such as water, clear fruit juice, black coffee, plain tea, and sports drinks. Do not drink energy drinks or alcohol. 2 hours before your procedure Stop drinking all liquids. You may be allowed to take medicines with small sips of water. Medicines Ask your health care provider about: Changing or stopping your regular medicines. These include any diabetes medicines or blood thinners you take. Taking medicines such as aspirin and ibuprofen. These medicines can thin your blood. Do not take these medicines unless your health care provider tells you to. Taking over-the-counter medicines, vitamins, herbs, and supplements. General instructions Do not use any products that contain nicotine or tobacco for at least 4 weeks before the procedure. These products include cigarettes, chewing tobacco, and vaping devices, such as e-cigarettes. If you need help quitting, ask your health care provider. If you will be going home right after the procedure, plan to have a responsible adult: Take you home from the hospital or clinic. You will not be allowed to drive. Care for you for the time you are told. Ask your health care provider what steps will be taken to help prevent infection. These may include: Washing skin with a soap that kills germs. Receiving antibiotic medicine. Tests You may have an exam or testing. You may have a urine sample taken to check for infection. What happens during the procedure? An IV will be inserted into one of your veins. You may be given: A sedative. This helps   you relax. Anesthesia. This will: Numb certain areas of your body. Make you fall asleep for surgery. Your urethra will be cleaned with a germ-killing solution. The ureteroscope will be passed through your urethra into your bladder. A salt-water solution will be sent through  the ureteroscope to fill your bladder. This will help the health care provider see the openings of your ureters more clearly. The ureteroscope will be passed into your ureter. If a growth is found, a biopsy may be done. If a stone is found, it may be removed through the ureteroscope, or the stone may be broken up using a laser, shock waves, or electrical energy. In some cases, if the ureter is too small, a tube may be inserted that keeps the ureter open (ureteral stent). The stent may be left in place for 1 or 2 weeks, and then the ureteroscopy procedure will be done again. The scope will be removed, and your bladder will be emptied. The procedure may vary among health care providers and hospitals. What happens after the procedure? Your blood pressure, heart rate, breathing rate, and blood oxygen level will be monitored until you leave the hospital or clinic. It is up to you to get the results of your procedure. Ask your health care provider, or the department that is doing the procedure, when your results will be ready. Summary Ureteroscopy is a procedure used to look at the inside of the kidneys and the ureters. You may need this procedure if you have frequent urinary tract infections (UTIs), blood in your urine, or a stone in one or both of your ureters. Follow instructions from your health care provider about eating and drinking. In some cases, if the ureter is too small, a tube may be inserted that keeps the ureter open (ureteral stent). The stent may be left in place for 1 or 2 weeks to keep the ureter open, and then the ureteroscopy procedure will be done again. This information is not intended to replace advice given to you by your health care provider. Make sure you discuss any questions you have with your health care provider. Document Revised: 03/13/2022 Document Reviewed: 03/13/2022 Elsevier Patient Education  2023 [employer]  

## 2022-09-30 NOTE — Progress Notes (Signed)
09/30/2022 2:59 PM   Mazel L [name] Apr 30, 1976 [id number]  Referring provider: Ivy Lynn, NP [address] [place],  [place] [address]  Followup nephrolithiasis   HPI: [name] is a 46yo here for evaluation of left ureteral calculus. She had flank pain 4 weeks ago and underwent CT on 10/18 which showed 66m left UPJ calculus. She has not passed her calculus. She has not had flank pain in 1 week. She denies any significant LUTS. NO hematuria or dysuria.    PMH: Past Medical History:  Diagnosis Date   Asthma    COPD (chronic obstructive pulmonary disease) ([place]    sees PCP   Depression    GERD (gastroesophageal reflux disease)    History of kidney stones    Hypertension    pt. denies  never taken meds   Pneumonia    PONV (postoperative nausea and vomiting)     Surgical History: Past Surgical History:  Procedure Laterality Date   ABDOMINAL HYSTERECTOMY     CHOLECYSTECTOMY     CYSTOSCOPY W/ RETROGRADES  08/04/2011   Procedure: CYSTOSCOPY WITH RETROGRADE PYELOGRAM;  Surgeon: MMarissa Nestle  Location: [facility];  Service: Urology;  Laterality: Right;  Right Retrograde   CYSTOSCOPY WITH RETROGRADE PYELOGRAM, URETEROSCOPY AND STENT PLACEMENT Left 07/24/2021   Procedure: CYSTOSCOPY WITH RETROGRADE PYELOGRAM, URETEROSCOPY AND STENT PLACEMENT;  Surgeon: MCleon Gustin MD;  Location: [facility];  Service: Urology;  Laterality: Left;   CYSTOSCOPY WITH RETROGRADE PYELOGRAM, URETEROSCOPY AND STENT PLACEMENT Bilateral 03/02/2022   Procedure: CYSTOSCOPY WITH RETROGRADE PYELOGRAM, RIGHT URETEROSCOPY AND BILATERAL STENT PLACEMENT;  Surgeon: MCleon Gustin MD;  Location: [facility];  Service: Urology;  Laterality: Bilateral;   CYSTOSCOPY WITH RETROGRADE PYELOGRAM, URETEROSCOPY AND STENT PLACEMENT Bilateral 03/23/2022   Procedure: CYSTOSCOPY WITH RETROGRADE PYELOGRAM, URETEROSCOPY AND STENT REMOVAL;  Surgeon: MCleon Gustin MD;  Location: [facility];  Service: Urology;   Laterality: Bilateral;   HOLMIUM LASER APPLICATION N/A 50/16/5537  Procedure: HOLMIUM LASER APPLICATION;  Surgeon: HArdis Hughs MD;  Location: [facility];  Service: Urology;  Laterality: N/A;   HOLMIUM LASER APPLICATION Left 84/82/7078  Procedure: HOLMIUM LASER APPLICATION;  Surgeon: MCleon Gustin MD;  Location: [facility];  Service: Urology;  Laterality: Left;   HOLMIUM LASER APPLICATION Bilateral 46/05/5448  Procedure: HOLMIUM LASER APPLICATION;  Surgeon: MCleon Gustin MD;  Location: [facility];  Service: Urology;  Laterality: Bilateral;   LITHOTRIPSY     NEPHROLITHOTOMY Right 04/15/2018   Procedure: RIGHT NEPHROLITHOTOMY PERCUTANEOUS WITH ACCESS;  Surgeon: HArdis Hughs MD;  Location: [facility];  Service: Urology;  Laterality: Right;   NEPHROLITHOTOMY Right 12/29/2018   Procedure: NEPHROLITHOTOMY PERCUTANEOUS;  Surgeon: HArdis Hughs MD;  Location: [facility];  Service: Urology;  Laterality: Right;   PERCUTANEOUS NEPHROLITHOTRIPSY  2012   both sides kidney    STONE EXTRACTION WITH BASKET Bilateral 03/02/2022   Procedure: STONE EXTRACTION WITH BASKET;  Surgeon: MCleon Gustin MD;  Location: [facility];  Service: Urology;  Laterality: Bilateral;   STONE EXTRACTION WITH BASKET Bilateral 03/23/2022   Procedure: STONE EXTRACTION WITH BASKET;  Surgeon: MCleon Gustin MD;  Location: [facility];  Service: Urology;  Laterality: Bilateral;   TUBAL LIGATION      Home Medications:  Allergies as of 09/30/2022       Reactions   Hydrocodone Nausea And Vomiting   Morphine And Related Hives, Other (See Comments)   Welps   Vicodin [hydrocodone-acetaminophen] Nausea And Vomiting  Medication List        Accurate as of September 30, 2022  2:59 PM. If you have any questions, ask your nurse or doctor.          allopurinol 300 MG tablet Commonly known as: ZYLOPRIM Take 1 tablet (300 mg total) by mouth daily.   B-12 PO Take 1 tablet by mouth daily.   cetirizine 10 MG  tablet Commonly known as: ZYRTEC Take 10 mg by mouth daily as needed for allergies.   cholecalciferol 25 MCG (1000 UNIT) tablet Commonly known as: VITAMIN D3 Take 1,000 Units by mouth daily.   diazepam 5 MG tablet Commonly known as: VALIUM Take 1 tablet (5 mg total) by mouth 2 (two) times daily.   indapamide 2.5 MG tablet Commonly known as: LOZOL Take 1 tablet (2.5 mg total) by mouth daily.   multivitamin with minerals Tabs tablet Take 1 tablet by mouth daily.   ondansetron 4 MG tablet Commonly known as: Zofran Take 1 tablet (4 mg total) by mouth every 8 (eight) hours as needed for nausea or vomiting.   ondansetron 4 MG tablet Commonly known as: Zofran Take 1 tablet (4 mg total) by mouth every 8 (eight) hours as needed for nausea or vomiting.   oxybutynin 5 MG tablet Commonly known as: DITROPAN Take 1 tablet (5 mg total) by mouth 3 (three) times daily.   oxyCODONE-acetaminophen 5-325 MG tablet Commonly known as: PERCOCET/ROXICET Take 1 tablet by mouth every 4 (four) hours as needed for severe pain.   Potassium Citrate 15 MEQ (1620 MG) Tbcr Commonly known as: Urocit-K 15 Take 1 tablet by mouth in the morning and at bedtime.   Trulicity 4.5 HM/0.9OB Sopn Generic drug: Dulaglutide Inject 4.5 mg as directed once a week.        Allergies:  Allergies  Allergen Reactions   Hydrocodone Nausea And Vomiting   Morphine And Related Hives and Other (See Comments)    Welps   Vicodin [Hydrocodone-Acetaminophen] Nausea And Vomiting    Family History: Family History  Problem Relation Age of Onset   COPD Mother    Cancer Father    COPD Father    Heart attack Father     Social History:  reports that she quit smoking about 20 months ago. Her smoking use included cigarettes. She has a 9.00 pack-year smoking history. She has never used smokeless tobacco. She reports that she does not drink alcohol and does not use drugs.  ROS: All other review of systems were reviewed  and are negative except what is noted above in HPI  Physical Exam: BP (!) 142/85   Pulse 98   LMP 11/05/2015   Constitutional:  Alert and oriented, No acute distress. HEENT: [place] AT, moist mucus membranes.  Trachea midline, no masses. Cardiovascular: No clubbing, cyanosis, or edema. Respiratory: Normal respiratory effort, no increased work of breathing. GI: Abdomen is soft, nontender, nondistended, no abdominal masses GU: No CVA tenderness.  Lymph: No cervical or inguinal lymphadenopathy. Skin: No rashes, bruises or suspicious lesions. Neurologic: Grossly intact, no focal deficits, moving all 4 extremities. Psychiatric: Normal mood and affect.  Laboratory Data: Lab Results  Component Value Date   WBC 10.4 03/20/2022   HGB 13.0 03/20/2022   HCT 40.2 03/20/2022   MCV 92.8 03/20/2022   PLT 300 03/20/2022    Lab Results  Component Value Date   CREATININE 0.85 06/03/2022    No results found for: "PSA"  No results found for: "TESTOSTERONE"  Lab Results  Component Value Date   HGBA1C 5.6 09/02/2022    Urinalysis    Component Value Date/Time   COLORURINE AMBER (A) 03/20/2022 0932   APPEARANCEUR Clear 09/08/2022 0926   LABSPEC 1.015 03/20/2022 0932   PHURINE 8.0 03/20/2022 0932   GLUCOSEU Negative 09/08/2022 0926   HGBUR LARGE (A) 03/20/2022 0932   BILIRUBINUR Negative 09/08/2022 0926   KETONESUR NEGATIVE 03/20/2022 0932   PROTEINUR Negative 09/08/2022 0926   PROTEINUR 100 (A) 03/20/2022 0932   UROBILINOGEN 0.2 02/12/2015 1932   NITRITE Negative 09/08/2022 0926   NITRITE NEGATIVE 03/20/2022 0932   LEUKOCYTESUR Negative 09/08/2022 0926   LEUKOCYTESUR MODERATE (A) 03/20/2022 0932    Lab Results  Component Value Date   LABMICR See below: 09/08/2022   WBCUA 0-5 09/08/2022   LABEPIT 0-10 09/08/2022   MUCUS Present 02/10/2022   BACTERIA Moderate (A) 09/08/2022    Pertinent Imaging: CT 09/16/2022: Images reviewed and discussed with the patient  Results for orders  placed during the hospital encounter of 07/02/21  DG Abd 1 View  Narrative CLINICAL DATA:  Left-sided flank pain.  EXAM: ABDOMEN - 1 VIEW  COMPARISON:  Ultrasound renal 07/02/2021, CT renal 10/10/2019  FINDINGS: Right upper quadrant surgical clips. The bowel gas pattern is normal. No radio-opaque calculi or other significant radiographic abnormality are seen.  IMPRESSION: Negative radiograph.  These results and ultrasound renal results from same day will be called to the ordering clinician or representative by the Radiologist Assistant, and communication documented in the PACS or [employer].   Electronically Signed By: Iven Finn M.D. On: 07/03/2021 21:41  No results found for this or any previous visit.  No results found for this or any previous visit.  No results found for this or any previous visit.  Results for orders placed during the hospital encounter of 08/24/22  US RENAL  Narrative CLINICAL DATA:  Nephrolithiasis  EXAM: RENAL / URINARY TRACT ULTRASOUND COMPLETE  COMPARISON:  CT scans of the abdomen and pelvis dated March 08, 2020, May 13, 2020, and March 06, 2022. Multiple ultrasounds of the kidneys since October 12, 2019  FINDINGS: Right Kidney:  Renal measurements: 11.6 x 4.9 x 5.7 cm = volume: 167 mL. Contains multiple shadowing foci with the largest measuring 8.6 mm. No hydronephrosis.  Left Kidney:  Renal measurements: 12.0 x 5.1 x 6.0 cm = volume: 192 mL. Contains an 8.9 mm stone in the lower pole. There is mild caliectasis, unchanged compared to the August 28, 2021 study but not appreciated on the Apr 29, 2022 and November 11, 2021 studies. There is a hypoechoic mass exophytic off the lower pole of the left kidney measuring 2.1 x 1.4 by 1.5 cm today. There is no internal blood flow within this mass. This mass measured 1.7 x 1.0 x 1.3 cm previously. This mass is nonspecific on today's study but met the criteria for a cyst  on previous CT scans since Apr 16, 2018. The mass demonstrated a maximum dimension of 1.7 cm on the Apr 16, 2018 CT scan and demonstrated an attenuation of less than 10 Hounsfield units. No follow-up imaging recommended for this cyst.  Bladder:  Appears normal for degree of bladder distention.  Other:  None.  IMPRESSION: 1. A bilateral renal stones. No hydronephrosis on the right. Mild caliectasis on the left is unchanged compared to the August 28, 2021 ultrasound but not appreciated on the November 11, 2021 and Apr 29, 2022 ultrasounds. 2. The bladder is unremarkable. 3. No other abnormalities.  Electronically Signed By: Dorise Bullion III M.D. On: 08/25/2022 08:11  No valid procedures specified. No results found for this or any previous visit.  Results for orders placed in visit on 09/08/22  CT RENAL STONE STUDY  Narrative CLINICAL DATA:  Left flank pain.  Nephrolithiasis.  EXAM: CT ABDOMEN AND PELVIS WITHOUT CONTRAST  TECHNIQUE: Multidetector CT imaging of the abdomen and pelvis was performed following the standard protocol without IV contrast.  RADIATION DOSE REDUCTION: This exam was performed according to the departmental dose-optimization program which includes automated exposure control, adjustment of the mA and/or kV according to patient size and/or use of iterative reconstruction technique.  COMPARISON:  03/20/2022  FINDINGS: Lower chest: No acute findings.  Hepatobiliary: No mass visualized on this unenhanced exam. Moderate diffuse hepatic steatosis again noted. Prior cholecystectomy. No evidence of biliary obstruction.  Pancreas: No mass or inflammatory process visualized on this unenhanced exam.  Spleen:  Within normal limits in size.  Adrenals/Urinary tract: Bilateral ureteral stents have been removed since prior study. A few tiny less than 5 mm renal calculi are seen bilaterally. Moderate left hydronephrosis is seen due to a 7  mm calculus at the left UPJ.  Stomach/Bowel: Stable small hiatal hernia. No evidence of obstruction, inflammatory process, or abnormal fluid collections. Normal appendix visualized. Mild diverticulosis is seen involving the sigmoid colon, however there is no evidence of diverticulitis.  Vascular/Lymphatic: No pathologically enlarged lymph nodes identified. No evidence of abdominal aortic aneurysm.  Reproductive: Prior hysterectomy noted. Adnexal regions are unremarkable in appearance.  Other:  None.  Musculoskeletal:  No suspicious bone lesions identified.  IMPRESSION: Moderate left hydronephrosis due to 7 mm calculus at the left UPJ.  Bilateral nephrolithiasis.  Stable small hiatal hernia.  Moderate hepatic steatosis.  Mild sigmoid diverticulosis, without radiographic evidence of diverticulitis.   Electronically Signed By: Marlaine Hind M.D. On: 09/17/2022 12:32   Assessment & Plan:    1. Left ureteral calculus --We discussed the management of kidney stones. These options include observation, ureteroscopy, shockwave lithotripsy (ESWL) and percutaneous nephrolithotomy (PCNL). We discussed which options are relevant to the patient's stone(s). We discussed the natural history of kidney stones as well as the complications of untreated stones and the impact on quality of life without treatment as well as with each of the above listed treatments. We also discussed the efficacy of each treatment in its ability to clear the stone burden. With any of these management options I discussed the signs and symptoms of infection and the need for emergent treatment should these be experienced. For each option we discussed the ability of each procedure to clear the patient of their stone burden.   For observation I described the risks which include but are not limited to silent renal damage, life-threatening infection, need for emergent surgery, failure to pass stone and pain.   For  ureteroscopy I described the risks which include bleeding, infection, damage to contiguous structures, positioning injury, ureteral stricture, ureteral avulsion, ureteral injury, need for prolonged ureteral stent, inability to perform ureteroscopy, need for an interval procedure, inability to clear stone burden, stent discomfort/pain, heart attack, stroke, pulmonary embolus and the inherent risks with general anesthesia.   For shockwave lithotripsy I described the risks which include arrhythmia, kidney contusion, kidney hemorrhage, need for transfusion, pain, inability to adequately break up stone, inability to pass stone fragments, Steinstrasse, infection associated with obstructing stones, need for alternate surgical procedure, need for repeat shockwave lithotripsy, MI, CVA, PE and the inherent risks with anesthesia/conscious  sedation.   For PCNL I described the risks including positioning injury, pneumothorax, hydrothorax, need for chest tube, inability to clear stone burden, renal laceration, arterial venous fistula or malformation, need for embolization of kidney, loss of kidney or renal function, need for repeat procedure, need for prolonged nephrostomy tube, ureteral avulsion, MI, CVA, PE and the inherent risks of general anesthesia.   - The patient would like to proceed with left ureteroscopic stone extraction - Urinalysis, Routine w reflex microscopic   No follow-ups on file.  Nicolette Bang, MD  [facility] [place]

## 2022-10-08 DIAGNOSIS — J449 Chronic obstructive pulmonary disease, unspecified: Secondary | ICD-10-CM | POA: Diagnosis not present

## 2022-10-08 DIAGNOSIS — E860 Dehydration: Secondary | ICD-10-CM | POA: Diagnosis not present

## 2022-10-08 DIAGNOSIS — Z885 Allergy status to narcotic agent status: Secondary | ICD-10-CM | POA: Diagnosis not present

## 2022-10-08 DIAGNOSIS — Z87891 Personal history of nicotine dependence: Secondary | ICD-10-CM | POA: Diagnosis not present

## 2022-10-08 DIAGNOSIS — K529 Noninfective gastroenteritis and colitis, unspecified: Secondary | ICD-10-CM | POA: Diagnosis not present

## 2022-10-08 DIAGNOSIS — R1011 Right upper quadrant pain: Secondary | ICD-10-CM | POA: Diagnosis not present

## 2022-10-08 DIAGNOSIS — R1013 Epigastric pain: Secondary | ICD-10-CM | POA: Diagnosis not present

## 2022-10-08 DIAGNOSIS — R1110 Vomiting, unspecified: Secondary | ICD-10-CM | POA: Diagnosis not present

## 2022-10-27 NOTE — Patient Instructions (Signed)
[name]  10/27/2022     @PREFPERIOPPHARMACY @   Your procedure is scheduled on  11/02/2022.   Report to [facility] at  0900  A.M.   Call this number if you have problems the morning of surgery:  [phone]  If you experience any cold or flu symptoms such as cough, fever, chills, shortness of breath, etc. between now and your scheduled surgery, please notify [phone] at the above number.   Remember:  Do not eat or drink after midnight.       Your last dose of trulicity should be on 10/25/2022.      DO NOT take any medications for diabetes the morning of your procedure.      Take these medicines the morning of surgery with A SIP OF WATER       allopurinol, zyrtec, zofran (if needed), oxycodone (if needed).     Do not wear jewelry, make-up or nail polish.  Do not wear lotions, powders, or perfumes, or deodorant.  Do not shave 48 hours prior to surgery.  Men may shave face and neck.  Do not bring valuables to the hospital.  [facility] is not responsible for any belongings or valuables.  Contacts, dentures or bridgework may not be worn into surgery.  Leave your suitcase in the car.  After surgery it may be brought to your room.  For patients admitted to the hospital, discharge time will be determined by your treatment team.  Patients discharged the day of surgery will not be allowed to drive home and must  have someone with them for 24 hours.    Special instructions:   DO NOT smoke tobacco or vape for 24 hours before your procedure.  Please read over the following fact sheets that you were given. Anesthesia Post-op Instructions and Care and Recovery After Surgery      Ureteral Stent Implantation, Care After The following information offers guidance on how to care for yourself after your procedure. Your health care provider may also give you more specific instructions. If you have problems or questions, contact your health care provider. What can I  expect after the procedure? After the procedure, it is common to have: Nausea. Mild pain when you urinate. You may feel this pain in your lower back or lower abdomen. The pain should stop within a few minutes after you urinate. This pattern may last for up to 1 week. A small amount of blood in your urine for several days. Follow these instructions at home: Medicines Take over-the-counter and prescription medicines only as told by your health care provider. If you were prescribed antibiotics, take them as told by your health care provider. Do not stop using the antibiotic even if you start to feel better. If you were given a sedative during the procedure, it can affect you for several hours. Do not drive or operate machinery until your health care provider says that it is safe. Ask your health care provider if the medicine prescribed to you: Requires you to avoid driving or using machinery. Can cause constipation. You may need to take these actions to prevent or treat constipation: Take over-the-counter or prescription medicines. Eat foods that are high in fiber, such as beans, whole grains, and fresh fruits and vegetables. Limit foods that are high in fat and processed sugars, such as fried or sweet foods. Activity Rest as told by your health care provider. Do not sit for a long time without moving.  Get up to take short walks every 1-2 hours. This will improve blood flow and breathing. Ask for help if you feel weak or unsteady. Return to your normal activities as told by your health care provider. Ask your health care provider what activities are safe for you. General instructions  If you have a catheter: Follow instructions from your health care provider about taking care of your catheter and collection bag. Do not take baths, swim, or use a hot tub until your health care provider approves. Ask your health care provider if you may take showers. You may only be allowed to take sponge  baths. Drink enough fluid to keep your urine pale yellow. Do not use any products that contain nicotine or tobacco. These products include cigarettes, chewing tobacco, and vaping devices, such as e-cigarettes. These can delay healing after surgery. If you need help quitting, ask your health care provider. Keep all follow-up visits. Contact a health care provider if: You start passing blood clots, or you have more than a small amount of blood in your urine. You have pain that gets worse or does not get better with medicine, especially pain when you urinate. You have trouble urinating. You feel nauseous or you vomit again and again during a period of more than 2 days after the procedure. You have a fever. Get help right away if: You are passing blood clots that are 1 inch (2.5 cm) or larger in size. You are leaking urine (have incontinence), or you cannot urinate. The end of the stent comes out of your urethra. You have sudden, sharp, or severe pain in your abdomen or lower back. You have swelling or pain in your legs. You have trouble breathing. These symptoms may be an emergency. Get help right away. Call 911. Do not wait to see if the symptoms will go away. Do not drive yourself to the hospital. Summary After the procedure, it is common to have mild pain when you urinate that goes away within a few minutes after you urinate. This may last for up to 1 week. Take over-the-counter and prescription medicines only as told by your health care provider. Drink enough fluid to keep your urine pale yellow. Call your health care provider if you start passing blood clots, or you have more than a small amount of blood in your urine. This information is not intended to replace advice given to you by your health care provider. Make sure you discuss any questions you have with your health care provider. Document Revised: 12/22/2021 Document Reviewed: 12/22/2021 Elsevier Patient Education  2023 [employer] Monitored Anesthesia Care, Care After The following information offers guidance on how to care for yourself after your procedure. Your health care provider may also give you more specific instructions. If you have problems or questions, contact your health care provider. What can I expect after the procedure? After the procedure, it is common to have: Tiredness. Little or no memory about what happened during or after the procedure. Impaired judgment when it comes to making decisions. Nausea or vomiting. Some trouble with balance. Follow these instructions at home: For the time period you were told by your health care provider:  Rest. Do not participate in activities where you could fall or become injured. Do not drive or use machinery. Do not drink alcohol. Do not take sleeping pills or medicines that cause drowsiness. Do not make important decisions or sign legal documents. Do not take care of children on your own. Medicines Take  over-the-counter and prescription medicines only as told by your health care provider. If you were prescribed antibiotics, take them as told by your health care provider. Do not stop using the antibiotic even if you start to feel better. Eating and drinking Follow instructions from your health care provider about what you may eat and drink. Drink enough fluid to keep your urine pale yellow. If you vomit: Drink clear fluids slowly and in small amounts as you are able. Clear fluids include water, ice chips, low-calorie sports drinks, and fruit juice that has water added to it (diluted fruit juice). Eat light and bland foods in small amounts as you are able. These foods include bananas, applesauce, rice, lean meats, toast, and crackers. General instructions  Have a responsible adult stay with you for the time you are told. It is important to have someone help care for you until you are awake and alert. If you have sleep apnea, surgery and some medicines  can increase your risk for breathing problems. Follow instructions from your health care provider about wearing your sleep device: When you are sleeping. This includes during daytime naps. While taking prescription pain medicines, sleeping medicines, or medicines that make you drowsy. Do not use any products that contain nicotine or tobacco. These products include cigarettes, chewing tobacco, and vaping devices, such as e-cigarettes. If you need help quitting, ask your health care provider. Contact a health care provider if: You feel nauseous or vomit every time you eat or drink. You feel light-headed. You are still sleepy or having trouble with balance after 24 hours. You get a rash. You have a fever. You have redness or swelling around the IV site. Get help right away if: You have trouble breathing. You have new confusion after you get home. These symptoms may be an emergency. Get help right away. Call 911. Do not wait to see if the symptoms will go away. Do not drive yourself to the hospital. This information is not intended to replace advice given to you by your health care provider. Make sure you discuss any questions you have with your health care provider. Document Revised: 04/13/2022 Document Reviewed: 04/13/2022 Elsevier Patient Education  2023 [employer].

## 2022-10-28 ENCOUNTER — Encounter (HOSPITAL_COMMUNITY)
Admission: RE | Admit: 2022-10-28 | Discharge: 2022-10-28 | Disposition: A | Discharge status: Home / Self Care | Payer: 59 | Source: Ambulatory Visit | Attending: Urology | Admitting: Urology

## 2022-10-28 ENCOUNTER — Encounter (HOSPITAL_COMMUNITY): Payer: Self-pay

## 2022-10-28 VITALS — BP 152/85 | HR 93 | Temp 97.5°F | Resp 18 | Ht 65.0 in | Wt 298.9 lb

## 2022-10-28 DIAGNOSIS — Z01818 Encounter for other preprocedural examination: Secondary | ICD-10-CM | POA: Diagnosis not present

## 2022-10-28 DIAGNOSIS — E876 Hypokalemia: Secondary | ICD-10-CM | POA: Insufficient documentation

## 2022-10-28 HISTORY — DX: Unspecified osteoarthritis, unspecified site: M19.90

## 2022-10-28 HISTORY — DX: Type 2 diabetes mellitus without complications: E11.9

## 2022-10-28 LAB — BASIC METABOLIC PANEL
Anion gap: 8 (ref 5–15)
BUN: 12 mg/dL (ref 6–20)
CO2: 24 mmol/L (ref 22–32)
Calcium: 8.4 mg/dL — ABNORMAL LOW (ref 8.9–10.3)
Chloride: 106 mmol/L (ref 98–111)
Creatinine, Ser: 0.78 mg/dL (ref 0.44–1.00)
GFR, Estimated: >60 mL/min (ref >60)
Glucose, Bld: 109 mg/dL — ABNORMAL HIGH (ref 70–99)
Potassium: 3.3 mmol/L — ABNORMAL LOW (ref 3.5–5.1)
Sodium: 138 mmol/L (ref 135–145)

## 2022-11-02 ENCOUNTER — Ambulatory Visit (HOSPITAL_BASED_OUTPATIENT_CLINIC_OR_DEPARTMENT_OTHER): Payer: 59 | Admitting: Anesthesiology

## 2022-11-02 ENCOUNTER — Ambulatory Visit (HOSPITAL_COMMUNITY)
Admission: RE | Admit: 2022-11-02 | Discharge: 2022-11-02 | Disposition: A | Discharge status: Home / Self Care | Payer: 59 | Source: Ambulatory Visit | Attending: Urology | Admitting: Urology

## 2022-11-02 ENCOUNTER — Ambulatory Visit (HOSPITAL_COMMUNITY): Payer: 59 | Admitting: Anesthesiology

## 2022-11-02 ENCOUNTER — Encounter (HOSPITAL_COMMUNITY)
Admission: RE | Disposition: A | Discharge status: Home / Self Care | Payer: Self-pay | Source: Ambulatory Visit | Attending: Urology

## 2022-11-02 ENCOUNTER — Encounter (HOSPITAL_COMMUNITY): Payer: Self-pay | Admitting: Urology

## 2022-11-02 ENCOUNTER — Telehealth: Payer: Self-pay

## 2022-11-02 ENCOUNTER — Ambulatory Visit (HOSPITAL_COMMUNITY): Payer: 59

## 2022-11-02 DIAGNOSIS — Z7984 Long term (current) use of oral hypoglycemic drugs: Secondary | ICD-10-CM | POA: Insufficient documentation

## 2022-11-02 DIAGNOSIS — Z79899 Other long term (current) drug therapy: Secondary | ICD-10-CM | POA: Diagnosis not present

## 2022-11-02 DIAGNOSIS — I10 Essential (primary) hypertension: Secondary | ICD-10-CM

## 2022-11-02 DIAGNOSIS — K219 Gastro-esophageal reflux disease without esophagitis: Secondary | ICD-10-CM | POA: Diagnosis not present

## 2022-11-02 DIAGNOSIS — Z87891 Personal history of nicotine dependence: Secondary | ICD-10-CM | POA: Diagnosis not present

## 2022-11-02 DIAGNOSIS — J449 Chronic obstructive pulmonary disease, unspecified: Secondary | ICD-10-CM | POA: Insufficient documentation

## 2022-11-02 DIAGNOSIS — N132 Hydronephrosis with renal and ureteral calculous obstruction: Secondary | ICD-10-CM | POA: Diagnosis not present

## 2022-11-02 DIAGNOSIS — E119 Type 2 diabetes mellitus without complications: Secondary | ICD-10-CM | POA: Insufficient documentation

## 2022-11-02 DIAGNOSIS — M1990 Unspecified osteoarthritis, unspecified site: Secondary | ICD-10-CM | POA: Insufficient documentation

## 2022-11-02 DIAGNOSIS — F32A Depression, unspecified: Secondary | ICD-10-CM | POA: Diagnosis not present

## 2022-11-02 DIAGNOSIS — R69 Illness, unspecified: Secondary | ICD-10-CM | POA: Diagnosis not present

## 2022-11-02 DIAGNOSIS — N201 Calculus of ureter: Secondary | ICD-10-CM | POA: Diagnosis not present

## 2022-11-02 DIAGNOSIS — N200 Calculus of kidney: Secondary | ICD-10-CM

## 2022-11-02 DIAGNOSIS — N1330 Unspecified hydronephrosis: Secondary | ICD-10-CM | POA: Diagnosis not present

## 2022-11-02 DIAGNOSIS — F419 Anxiety disorder, unspecified: Secondary | ICD-10-CM | POA: Diagnosis not present

## 2022-11-02 HISTORY — PX: CYSTOSCOPY WITH RETROGRADE PYELOGRAM, URETEROSCOPY AND STENT PLACEMENT: SHX5789

## 2022-11-02 HISTORY — PX: HOLMIUM LASER APPLICATION: SHX5852

## 2022-11-02 LAB — GLUCOSE, CAPILLARY: Glucose-Capillary: 96 mg/dL (ref 70–99)

## 2022-11-02 SURGERY — CYSTOURETEROSCOPY, WITH RETROGRADE PYELOGRAM AND STENT INSERTION
Anesthesia: General | Site: Ureter | Laterality: Left

## 2022-11-02 MED ORDER — PROPOFOL 10 MG/ML IV BOLUS
INTRAVENOUS | Status: AC
  Filled 2022-11-02: qty 20

## 2022-11-02 MED ORDER — OXYCODONE-ACETAMINOPHEN 5-325 MG PO TABS: 1.0000 | ORAL_TABLET | ORAL | 0 refills | Status: DC | PRN

## 2022-11-02 MED ORDER — DIATRIZOATE MEGLUMINE 30 % UR SOLN
URETHRAL | Status: AC
  Filled 2022-11-02: qty 100

## 2022-11-02 MED ORDER — DEXAMETHASONE SODIUM PHOSPHATE 10 MG/ML IJ SOLN
INTRAMUSCULAR | Status: AC
  Filled 2022-11-02: qty 1

## 2022-11-02 MED ORDER — FENTANYL CITRATE (PF) 100 MCG/2ML IJ SOLN
INTRAMUSCULAR | Status: AC
  Filled 2022-11-02: qty 2

## 2022-11-02 MED ORDER — SCOPOLAMINE 1 MG/3DAYS TD PT72
1.0000 | MEDICATED_PATCH | Freq: Once | TRANSDERMAL | Status: DC
  Administered 2022-11-02: 1.5 mg via TRANSDERMAL

## 2022-11-02 MED ORDER — HYDROMORPHONE HCL 1 MG/ML IJ SOLN: 0.2500 mg | INTRAMUSCULAR | Status: DC | PRN

## 2022-11-02 MED ORDER — DEXAMETHASONE SODIUM PHOSPHATE 10 MG/ML IJ SOLN
INTRAMUSCULAR | Status: DC | PRN
  Administered 2022-11-02: 10 mg via INTRAVENOUS

## 2022-11-02 MED ORDER — MIDAZOLAM HCL 2 MG/2ML IJ SOLN
INTRAMUSCULAR | Status: AC
  Filled 2022-11-02: qty 2

## 2022-11-02 MED ORDER — METOCLOPRAMIDE HCL 5 MG/ML IJ SOLN
10.0000 mg | Freq: Once | INTRAMUSCULAR | Status: AC
  Administered 2022-11-02: 10 mg via INTRAVENOUS

## 2022-11-02 MED ORDER — METOCLOPRAMIDE HCL 5 MG/ML IJ SOLN
INTRAMUSCULAR | Status: AC
  Filled 2022-11-02: qty 2

## 2022-11-02 MED ORDER — WATER FOR IRRIGATION, STERILE IR SOLN
Status: DC | PRN
  Administered 2022-11-02: 500 mL

## 2022-11-02 MED ORDER — ONDANSETRON HCL 4 MG PO TABS: 4.0000 mg | ORAL_TABLET | Freq: Three times a day (TID) | ORAL | 0 refills | Status: DC | PRN

## 2022-11-02 MED ORDER — PROPOFOL 10 MG/ML IV BOLUS
INTRAVENOUS | Status: DC | PRN
  Administered 2022-11-02: 200 mg via INTRAVENOUS

## 2022-11-02 MED ORDER — CHLORHEXIDINE GLUCONATE 0.12 % MT SOLN
15.0000 mL | Freq: Once | OROMUCOSAL | Status: AC
  Administered 2022-11-02: 15 mL via OROMUCOSAL
  Filled 2022-11-02: qty 15

## 2022-11-02 MED ORDER — MIDAZOLAM HCL 2 MG/2ML IJ SOLN
2.0000 mg | Freq: Once | INTRAMUSCULAR | Status: AC
  Administered 2022-11-02: 2 mg via INTRAVENOUS

## 2022-11-02 MED ORDER — SUGAMMADEX SODIUM 500 MG/5ML IV SOLN
INTRAVENOUS | Status: AC
  Filled 2022-11-02: qty 5

## 2022-11-02 MED ORDER — CEFAZOLIN SODIUM-DEXTROSE 2-4 GM/100ML-% IV SOLN
INTRAVENOUS | Status: AC
  Filled 2022-11-02: qty 100

## 2022-11-02 MED ORDER — SUGAMMADEX SODIUM 200 MG/2ML IV SOLN
INTRAVENOUS | Status: DC | PRN
  Administered 2022-11-02: 271.2 mg via INTRAVENOUS

## 2022-11-02 MED ORDER — CEFAZOLIN IN SODIUM CHLORIDE 3-0.9 GM/100ML-% IV SOLN
3.0000 g | INTRAVENOUS | Status: AC
  Administered 2022-11-02: 3 g via INTRAVENOUS

## 2022-11-02 MED ORDER — LIDOCAINE HCL (PF) 2 % IJ SOLN
INTRAMUSCULAR | Status: AC
  Filled 2022-11-02: qty 5

## 2022-11-02 MED ORDER — ONDANSETRON HCL 4 MG/2ML IJ SOLN
INTRAMUSCULAR | Status: DC | PRN
  Administered 2022-11-02: 4 mg via INTRAVENOUS

## 2022-11-02 MED ORDER — MEPERIDINE HCL 50 MG/ML IJ SOLN: 6.2500 mg | INTRAMUSCULAR | Status: DC | PRN

## 2022-11-02 MED ORDER — MIDAZOLAM HCL 5 MG/5ML IJ SOLN
INTRAMUSCULAR | Status: DC | PRN
  Administered 2022-11-02: 2 mg via INTRAVENOUS

## 2022-11-02 MED ORDER — LACTATED RINGERS IV SOLN: INTRAVENOUS | Status: DC

## 2022-11-02 MED ORDER — CEFAZOLIN SODIUM-DEXTROSE 2-4 GM/100ML-% IV SOLN: 2.0000 g | INTRAVENOUS | Status: DC

## 2022-11-02 MED ORDER — ROCURONIUM BROMIDE 10 MG/ML (PF) SYRINGE
PREFILLED_SYRINGE | INTRAVENOUS | Status: DC | PRN
  Administered 2022-11-02: 50 mg via INTRAVENOUS

## 2022-11-02 MED ORDER — DIATRIZOATE MEGLUMINE 30 % UR SOLN
URETHRAL | Status: DC | PRN
  Administered 2022-11-02: 20 mL via URETHRAL

## 2022-11-02 MED ORDER — ROCURONIUM BROMIDE 10 MG/ML (PF) SYRINGE
PREFILLED_SYRINGE | INTRAVENOUS | Status: AC
  Filled 2022-11-02: qty 10

## 2022-11-02 MED ORDER — GLYCOPYRROLATE PF 0.2 MG/ML IJ SOSY
PREFILLED_SYRINGE | INTRAMUSCULAR | Status: AC
  Filled 2022-11-02: qty 1

## 2022-11-02 MED ORDER — SCOPOLAMINE 1 MG/3DAYS TD PT72
MEDICATED_PATCH | TRANSDERMAL | Status: AC
  Filled 2022-11-02: qty 1

## 2022-11-02 MED ORDER — FENTANYL CITRATE (PF) 100 MCG/2ML IJ SOLN
INTRAMUSCULAR | Status: DC | PRN
  Administered 2022-11-02: 100 ug via INTRAVENOUS

## 2022-11-02 MED ORDER — SODIUM CHLORIDE 0.9 % IR SOLN
Status: DC | PRN
  Administered 2022-11-02 (×2): 3000 mL

## 2022-11-02 MED ORDER — ORAL CARE MOUTH RINSE: 15.0000 mL | Freq: Once | OROMUCOSAL | Status: AC

## 2022-11-02 MED ORDER — ONDANSETRON HCL 4 MG/2ML IJ SOLN
INTRAMUSCULAR | Status: AC
  Filled 2022-11-02: qty 2

## 2022-11-02 SURGICAL SUPPLY — 25 items
BAG DRAIN URO TABLE W/ADPT NS (BAG) ×1 IMPLANT
BAG DRN 8 ADPR NS SKTRN CSTL (BAG) ×1
BAG HAMPER (MISCELLANEOUS) ×1 IMPLANT
CATH INTERMIT  6FR 70CM (CATHETERS) ×1 IMPLANT
CLOTH BEACON ORANGE TIMEOUT ST (SAFETY) ×1 IMPLANT
DECANTER SPIKE VIAL GLASS SM (MISCELLANEOUS) ×1 IMPLANT
EXTRACTOR STONE NITINOL NGAGE (UROLOGICAL SUPPLIES) IMPLANT
GLOVE BIO SURGEON STRL SZ8 (GLOVE) ×1 IMPLANT
GLOVE BIOGEL PI IND STRL 7.0 (GLOVE) ×2 IMPLANT
GOWN STRL REUS W/TWL LRG LVL3 (GOWN DISPOSABLE) ×1 IMPLANT
GOWN STRL REUS W/TWL XL LVL3 (GOWN DISPOSABLE) ×1 IMPLANT
GUIDEWIRE STR DUAL SENSOR (WIRE) ×1 IMPLANT
GUIDEWIRE STR ZIPWIRE 035X150 (MISCELLANEOUS) ×1 IMPLANT
IV NS IRRIG 3000ML ARTHROMATIC (IV SOLUTION) ×2 IMPLANT
KIT TURNOVER CYSTO (KITS) ×1 IMPLANT
MANIFOLD NEPTUNE II (INSTRUMENTS) ×1 IMPLANT
PACK CYSTO (CUSTOM PROCEDURE TRAY) ×1 IMPLANT
PAD ARMBOARD 7.5X6 YLW CONV (MISCELLANEOUS) ×1 IMPLANT
STENT URET 6FRX26 CONTOUR (STENTS) IMPLANT
SYR 10ML LL (SYRINGE) ×1 IMPLANT
SYR CONTROL 10ML LL (SYRINGE) ×1 IMPLANT
TOWEL OR 17X26 4PK STRL BLUE (TOWEL DISPOSABLE) ×1 IMPLANT
TRACTIP FLEXIVA PULS ID 200XHI (Laser) IMPLANT
TRACTIP FLEXIVA PULSE ID 200 (Laser) ×1
WATER STERILE IRR 500ML POUR (IV SOLUTION) ×1 IMPLANT

## 2022-11-02 NOTE — Transfer of Care (Signed)
Immediate Anesthesia Transfer of Care Note  Patient: [name]  Procedure(s) Performed: CYSTOSCOPY WITH RETROGRADE PYELOGRAM, URETEROSCOPY AND STENT PLACEMENT (Left: Ureter) HOLMIUM LASER APPLICATION (Left: Ureter)  Patient Location: PACU  Anesthesia Type:General  Level of Consciousness: awake, alert , oriented, and patient cooperative  Airway & Oxygen Therapy: Patient Spontanous Breathing  Post-op Assessment: Report given to RN and Post -op Vital signs reviewed and stable  Post vital signs: Reviewed and stable  Last Vitals:  Vitals Value Taken Time  BP 108/47 11/02/22 1234  Temp 98.5   Pulse 89 11/02/22 1238  Resp 25 11/02/22 1238  SpO2 93 % 11/02/22 1238  Vitals shown include unvalidated device data.  Last Pain:  Vitals:   11/02/22 0933  TempSrc: Oral  PainSc:          Complications: No notable events documented.

## 2022-11-02 NOTE — H&P (Signed)
Urology Admission H&P  Chief Complaint: left ureteral calculus  History of Present Illness: [name] is a 46yo here for evaluation of left ureteral calculus. She had flank pain 4 weeks ago and underwent CT on 10/18 which showed 7mm left UPJ calculus. She has not passed her calculus. No other complaints today  Past Medical History:  Diagnosis Date   Arthritis    Asthma    COPD (chronic obstructive pulmonary disease) (HCC)    sees PCP   Depression    Diabetes mellitus without complication (HCC)    GERD (gastroesophageal reflux disease)    History of kidney stones    Hypertension    pt. denies  never taken meds   Pneumonia    PONV (postoperative nausea and vomiting)    Past Surgical History:  Procedure Laterality Date   ABDOMINAL HYSTERECTOMY     CHOLECYSTECTOMY     CYSTOSCOPY W/ RETROGRADES  08/04/2011   Procedure: CYSTOSCOPY WITH RETROGRADE PYELOGRAM;  Surgeon: Ky Barban;  Location: [facility];  Service: Urology;  Laterality: Right;  Right Retrograde   CYSTOSCOPY WITH RETROGRADE PYELOGRAM, URETEROSCOPY AND STENT PLACEMENT Left 07/24/2021   Procedure: CYSTOSCOPY WITH RETROGRADE PYELOGRAM, URETEROSCOPY AND STENT PLACEMENT;  Surgeon: Malen Gauze, MD;  Location: [facility];  Service: Urology;  Laterality: Left;   CYSTOSCOPY WITH RETROGRADE PYELOGRAM, URETEROSCOPY AND STENT PLACEMENT Bilateral 03/02/2022   Procedure: CYSTOSCOPY WITH RETROGRADE PYELOGRAM, RIGHT URETEROSCOPY AND BILATERAL STENT PLACEMENT;  Surgeon: Malen Gauze, MD;  Location: [facility];  Service: Urology;  Laterality: Bilateral;   CYSTOSCOPY WITH RETROGRADE PYELOGRAM, URETEROSCOPY AND STENT PLACEMENT Bilateral 03/23/2022   Procedure: CYSTOSCOPY WITH RETROGRADE PYELOGRAM, URETEROSCOPY AND STENT REMOVAL;  Surgeon: Malen Gauze, MD;  Location: [facility];  Service: Urology;  Laterality: Bilateral;   HOLMIUM LASER APPLICATION N/A 04/15/2018   Procedure: HOLMIUM LASER APPLICATION;  Surgeon: Crist Fat, MD;   Location: [facility];  Service: Urology;  Laterality: N/A;   HOLMIUM LASER APPLICATION Left 07/24/2021   Procedure: HOLMIUM LASER APPLICATION;  Surgeon: Malen Gauze, MD;  Location: [facility];  Service: Urology;  Laterality: Left;   HOLMIUM LASER APPLICATION Bilateral 03/02/2022   Procedure: HOLMIUM LASER APPLICATION;  Surgeon: Malen Gauze, MD;  Location: [facility];  Service: Urology;  Laterality: Bilateral;   LITHOTRIPSY     NEPHROLITHOTOMY Right 04/15/2018   Procedure: RIGHT NEPHROLITHOTOMY PERCUTANEOUS WITH ACCESS;  Surgeon: Crist Fat, MD;  Location: [facility];  Service: Urology;  Laterality: Right;   NEPHROLITHOTOMY Right 12/29/2018   Procedure: NEPHROLITHOTOMY PERCUTANEOUS;  Surgeon: Crist Fat, MD;  Location: [facility];  Service: Urology;  Laterality: Right;   PERCUTANEOUS NEPHROLITHOTRIPSY  2012   both sides kidney    STONE EXTRACTION WITH BASKET Bilateral 03/02/2022   Procedure: STONE EXTRACTION WITH BASKET;  Surgeon: Malen Gauze, MD;  Location: [facility];  Service: Urology;  Laterality: Bilateral;   STONE EXTRACTION WITH BASKET Bilateral 03/23/2022   Procedure: STONE EXTRACTION WITH BASKET;  Surgeon: Malen Gauze, MD;  Location: [facility];  Service: Urology;  Laterality: Bilateral;   TUBAL LIGATION      Home Medications:  Current Facility-Administered Medications  Medication Dose Route Frequency Provider Last Rate Last Admin   ceFAZolin (ANCEF) IVPB 3g/100 mL premix  3 g Intravenous 30 min Pre-Op Nitasha Jewel, Mardene Celeste, MD       lactated ringers infusion   Intravenous Continuous Molli Barrows, MD 50 mL/hr at 11/02/22 1041 New Bag at 11/02/22 1041   midazolam (VERSED)  2 MG/2ML injection            scopolamine (TRANSDERM-SCOP) 1 MG/3DAYS 1.5 mg  1 patch Transdermal Once Battula, Rajamani C, MD   1.5 mg at 11/02/22 1058   scopolamine (TRANSDERM-SCOP) 1 MG/3DAYS            Allergies:  Allergies  Allergen Reactions   Hydrocodone Nausea And Vomiting   Morphine  And Related Hives and Other (See Comments)    Welps   Vicodin [Hydrocodone-Acetaminophen] Nausea And Vomiting    Family History  Problem Relation Age of Onset   COPD Mother    Cancer Father    COPD Father    Heart attack Father    Social History:  reports that she quit smoking about 21 months ago. Her smoking use included cigarettes. She has a 9.00 pack-year smoking history. She has never used smokeless tobacco. She reports that she does not drink alcohol and does not use drugs.  Review of Systems  Genitourinary:  Positive for flank pain.  All other systems reviewed and are negative.   Physical Exam:  Vital signs in last 24 hours: Temp:  [97.9 F (36.6 C)] 97.9 F (36.6 C) (12/04 0933) Pulse Rate:  [71-79] 79 (12/04 1008) Resp:  [12-19] 18 (12/04 1028) BP: (117)/(71-72) 117/72 (12/04 0958) SpO2:  [97 %] 97 % (12/04 1008) Weight:  [135.6 kg] 135.6 kg (12/04 0626) Physical Exam Vitals reviewed.  Constitutional:      Appearance: Normal appearance.  HENT:     Head: Normocephalic and atraumatic.     Mouth/Throat:     Mouth: Mucous membranes are dry.  Eyes:     Extraocular Movements: Extraocular movements intact.     Pupils: Pupils are equal, round, and reactive to light.  Cardiovascular:     Rate and Rhythm: Normal rate and regular rhythm.  Pulmonary:     Effort: Pulmonary effort is normal. No respiratory distress.  Abdominal:     General: Abdomen is flat. There is no distension.  Musculoskeletal:        General: No swelling. Normal range of motion.     Cervical back: Normal range of motion and neck supple.  Skin:    General: Skin is warm and dry.  Neurological:     General: No focal deficit present.     Mental Status: She is alert and oriented to person, place, and time.  Psychiatric:        Mood and Affect: Mood normal.        Behavior: Behavior normal.        Thought Content: Thought content normal.        Judgment: Judgment normal.     Laboratory Data:   Results for orders placed or performed during the hospital encounter of 11/02/22 (from the past 24 hour(s))  Glucose, capillary     Status: None   Collection Time: 11/02/22  9:49 AM  Result Value Ref Range   Glucose-Capillary 96 70 - 99 mg/dL   No results found for this or any previous visit (from the past 240 hour(s)). Creatinine: Recent Labs    10/28/22 1318  CREATININE 0.78   Baseline Creatinine: 0.8  Impression/Assessment:  46yo with left ureteral calculus  Plan:  -We discussed the management of kidney stones. These options include observation, ureteroscopy, shockwave lithotripsy ([facility]) and percutaneous nephrolithotomy (PCNL). We discussed which options are relevant to the patient's stone(s). We discussed the natural history of kidney stones as well as the complications of untreated stones  and the impact on quality of life without treatment as well as with each of the above listed treatments. We also discussed the efficacy of each treatment in its ability to clear the stone burden. With any of these management options I discussed the signs and symptoms of infection and the need for emergent treatment should these be experienced. For each option we discussed the ability of each procedure to clear the patient of their stone burden.   For observation I described the risks which include but are not limited to silent renal damage, life-threatening infection, need for emergent surgery, failure to pass stone and pain.   For ureteroscopy I described the risks which include bleeding, infection, damage to contiguous structures, positioning injury, ureteral stricture, ureteral avulsion, ureteral injury, need for prolonged ureteral stent, inability to perform ureteroscopy, need for an interval procedure, inability to clear stone burden, stent discomfort/pain, heart attack, stroke, pulmonary embolus and the inherent risks with general anesthesia.   For shockwave lithotripsy I described the risks  which include arrhythmia, kidney contusion, kidney hemorrhage, need for transfusion, pain, inability to adequately break up stone, inability to pass stone fragments, Steinstrasse, infection associated with obstructing stones, need for alternate surgical procedure, need for repeat shockwave lithotripsy, MI, CVA, PE and the inherent risks with anesthesia/conscious sedation.   For PCNL I described the risks including positioning injury, pneumothorax, hydrothorax, need for chest tube, inability to clear stone burden, renal laceration, arterial venous fistula or malformation, need for embolization of kidney, loss of kidney or renal function, need for repeat procedure, need for prolonged nephrostomy tube, ureteral avulsion, MI, CVA, PE and the inherent risks of general anesthesia.   - The patient would like to proceed with left ureteroscopic stone extraction  [name] 11/02/2022, 11:29 AM

## 2022-11-02 NOTE — Anesthesia Postprocedure Evaluation (Signed)
Anesthesia Post Note  Patient: [name]  Procedure(s) Performed: CYSTOSCOPY WITH RETROGRADE PYELOGRAM, URETEROSCOPY AND STENT PLACEMENT (Left: Ureter) HOLMIUM LASER APPLICATION (Left: Ureter)  Patient location during evaluation: Phase II Anesthesia Type: General Level of consciousness: awake and alert and oriented Pain management: pain level controlled Vital Signs Assessment: post-procedure vital signs reviewed and stable Respiratory status: spontaneous breathing, nonlabored ventilation and respiratory function stable Cardiovascular status: blood pressure returned to baseline and stable Postop Assessment: no apparent nausea or vomiting Anesthetic complications: no  No notable events documented.   Last Vitals:  Vitals:   11/02/22 1300 11/02/22 1319  BP: 107/64 (!) 117/57  Pulse: 84 86  Resp: 16 18  Temp:    SpO2: 98% 99%    Last Pain:  Vitals:   11/02/22 1319  TempSrc:   PainSc: 0-No pain                 Radhika Dershem C Coumba Kellison

## 2022-11-02 NOTE — Anesthesia Preprocedure Evaluation (Signed)
Anesthesia Evaluation  Patient identified by MRN, date of birth, ID band Patient awake    Reviewed: Allergy & Precautions, NPO status , Patient's Chart, lab work & pertinent test results  History of Anesthesia Complications (+) PONV, DIFFICULT IV STICK / SPECIAL LINE and history of anesthetic complications  Airway Mallampati: II  TM Distance: >3 FB Neck ROM: Full    Dental  (+) Dental Advisory Given, Missing   Pulmonary asthma , pneumonia, COPD,  COPD inhaler, former smoker   Pulmonary exam normal breath sounds clear to auscultation       Cardiovascular Exercise Tolerance: Good hypertension, Pt. on medications Normal cardiovascular exam Rhythm:Regular Rate:Normal  06-Mar-2022 19:35:56 [facility] ROUTINE RECORD 1975/12/09 (45 yr) Female Caucasian Vent. rate 105 BPM PR interval 153 ms QRS duration 99 ms QT/QTcB 376/497 ms P-R-T axes 84 87 34 Sinus tachycardia Low voltage, precordial leads Borderline prolonged QT interval When compared with ECG of 07/21/2021, QT has lengthened HEART RATE has increased Confirmed by Dione Booze ([id number]) on 03/07/2022 4:21:48 AM   Neuro/Psych  PSYCHIATRIC DISORDERS Anxiety Depression       GI/Hepatic ,GERD  Medicated and Controlled,,  Endo/Other  diabetes, Well Controlled, Type 2, Oral Hypoglycemic Agents  Morbid obesity  Renal/GU Renal disease     Musculoskeletal  (+) Arthritis , Osteoarthritis,    Abdominal   Peds  Hematology negative hematology ROS (+)   Anesthesia Other Findings   Reproductive/Obstetrics negative OB ROS                             Anesthesia Physical Anesthesia Plan  ASA: 3  Anesthesia Plan: General   Post-op Pain Management: Dilaudid IV   Induction: Intravenous  PONV Risk Score and Plan: 4 or greater and Ondansetron, Dexamethasone, Midazolam and Metaclopromide  Airway Management Planned: Oral  ETT  Additional Equipment:   Intra-op Plan:   Post-operative Plan: Extubation in OR  Informed Consent: I have reviewed the patients History and Physical, chart, labs and discussed the procedure including the risks, benefits and alternatives for the proposed anesthesia with the patient or authorized representative who has indicated his/her understanding and acceptance.       Plan Discussed with: CRNA and Surgeon  Anesthesia Plan Comments:         Anesthesia Quick Evaluation

## 2022-11-02 NOTE — Anesthesia Procedure Notes (Signed)
Procedure Name: Intubation Date/Time: 11/02/2022 11:41 AM  Performed by: Karna Dupes, CRNAPre-anesthesia Checklist: Patient identified, Emergency Drugs available, Suction available and Patient being monitored Patient Re-evaluated:Patient Re-evaluated prior to induction Oxygen Delivery Method: Circle system utilized Preoxygenation: Pre-oxygenation with 100% oxygen Induction Type: IV induction Ventilation: Mask ventilation without difficulty Laryngoscope Size: Mac and 3 Grade View: Grade I Tube type: Oral Tube size: 7.0 mm Number of attempts: 1 Airway Equipment and Method: Stylet and Oral airway Placement Confirmation: ETT inserted through vocal cords under direct vision, positive ETCO2 and breath sounds checked- equal and bilateral Secured at: 21 cm Tube secured with: Tape Dental Injury: Teeth and Oropharynx as per pre-operative assessment

## 2022-11-02 NOTE — Op Note (Signed)
Preoperative diagnosis: Left ureteral stone  Postoperative diagnosis: Same  Procedure: 1 cystoscopy 2. Left retrograde pyelography 3.  Intraoperative fluoroscopy, under one hour, with interpretation 4.  Left ureteroscopic stone manipulation with laser lithotripsy 5.  Left 6 x 26 JJ stent placement  Attending: [name], MD  Anesthesia: General  Estimated blood loss: None  Drains: Left 6 x 26 JJ ureteral stent with tether  Specimens: stone for analysis  Antibiotics: ancef  Findings: left proximal ureteral stone. Moderate hydronephrosis. No masses/lesions in the bladder. Ureteral orifices in normal anatomic location.  Indications: Patient is a 46 year old female with a history of left ureteral stone and who has persistent left flank pain.  After discussing treatment options, she decided proceed with left ureteroscopic stone manipulation.  Procedure in detail: The patient was brought to the operating room and a brief timeout was done to ensure correct patient, correct procedure, correct site.  General anesthesia was administered patient was placed in dorsal lithotomy position.  Her genitalia was then prepped and draped in usual sterile fashion.  A rigid 22 French cystoscope was passed in the urethra and the bladder.  Bladder was inspected free masses or lesions.  the ureteral orifices were in the normal orthotopic locations.  a 6 french ureteral catheter was then instilled into the left ureteral orifice.  a gentle retrograde was obtained and findings noted above.  we then placed a zip wire through the ureteral catheter and advanced up to the renal pelvis.  we then removed the cystoscope and cannulated the left ureteral orifice with a semirigid ureteroscope. We encountered a stone in the proximal ureter. Using a 242nm laser fiber the stone was fragmented. The fragments were then removed with an NGage basket.  Repeat ureteroscopy to the UPJ yielded no additional calculi.   We then placed  a 6 x 26 double-j ureteral stent over the original zip wire. We then removed the wire and good coil was noted in the the renal pelvis under fluoroscopy and the bladder under direct vision.   the stone fragments were then removed from the bladder and sent for analysis.   the bladder was then drained and this concluded the procedure which was well tolerated by patient.  Complications: None  Condition: Stable, extubated, transferred to PACU  Plan: Patient is to be discharged home as to follow-up in one week for stent removal.

## 2022-11-03 ENCOUNTER — Other Ambulatory Visit: Payer: Self-pay | Admitting: Urology

## 2022-11-03 ENCOUNTER — Other Ambulatory Visit: Payer: Self-pay

## 2022-11-03 MED ORDER — OXYCODONE HCL 5 MG PO TABS: 5.0000 mg | ORAL_TABLET | ORAL | 0 refills | Status: DC | PRN

## 2022-11-03 MED ORDER — TRULICITY 4.5 MG/0.5ML ~~LOC~~ SOAJ: 4.5000 mg | SUBCUTANEOUS | 0 refills | Status: DC

## 2022-11-05 ENCOUNTER — Encounter (HOSPITAL_COMMUNITY): Payer: Self-pay | Admitting: Urology

## 2022-11-09 ENCOUNTER — Ambulatory Visit (INDEPENDENT_AMBULATORY_CARE_PROVIDER_SITE_OTHER): Payer: 59 | Admitting: Urology

## 2022-11-09 VITALS — BP 139/85 | HR 97

## 2022-11-09 DIAGNOSIS — N200 Calculus of kidney: Secondary | ICD-10-CM

## 2022-11-09 LAB — MICROSCOPIC EXAMINATION: RBC, Urine: >30 /hpf — ABNORMAL HIGH (ref 0–2)

## 2022-11-09 LAB — URINALYSIS, ROUTINE W REFLEX MICROSCOPIC
Bilirubin, UA: NEGATIVE
Glucose, UA: NEGATIVE
Nitrite, UA: NEGATIVE
Specific Gravity, UA: 1.02 (ref 1.005–1.030)
Urobilinogen, Ur: 1 mg/dL (ref 0.2–1.0)
pH, UA: 8.5 — ABNORMAL HIGH (ref 5.0–7.5)

## 2022-11-09 MED ORDER — CEPHALEXIN 250 MG PO CAPS
500.0000 mg | ORAL_CAPSULE | Freq: Once | ORAL | Status: AC
  Administered 2022-11-09: 500 mg via ORAL

## 2022-11-09 NOTE — Progress Notes (Signed)
   11/09/22  CC: followup after ureteroscopy   HPI: Ms Sedlack is a 46yo here for stent removal Blood pressure 139/85, pulse 97, last menstrual period 11/05/2015. NED. A&Ox3.   No respiratory distress   Abd soft, NT, ND Normal external genitalia with patent urethral meatus  Cystoscopy Procedure Note  Patient identification was confirmed, informed consent was obtained, and patient was prepped using Betadine solution.  Lidocaine jelly was administered per urethral meatus.    Procedure: - Flexible cystoscope introduced, without any difficulty.   - Thorough search of the bladder revealed:    normal urethral meatus    normal urothelium    no stones    no ulcers     no tumors    no urethral polyps    no trabeculation  - Ureteral orifices were normal in position and appearance. -Using a grasper the left ureteral stent was removed intact  Post-Procedure: - Patient tolerated the procedure well  Assessment/ Plan: -Followup 6 weeks with renal US   No follow-ups on file.  [name], MD

## 2022-11-09 NOTE — Patient Instructions (Signed)

## 2022-11-10 LAB — CALCULI, WITH PHOTOGRAPH (CLINICAL LAB)
CaHPO4 (Brushite): 80 %
Calcium Oxalate Dihydrate: 10 %
Calcium Oxalate Monohydrate: 10 %
Weight Calculi: 40 mg

## 2022-11-20 NOTE — Telephone Encounter (Signed)
MD filled medication 11/03/22

## 2022-12-02 ENCOUNTER — Telehealth: Payer: Self-pay | Admitting: Nurse Practitioner

## 2022-12-02 NOTE — Telephone Encounter (Signed)
Have her stop her Trulicity for now and we can come up with a different plan at her follow up visit.

## 2022-12-02 NOTE — Telephone Encounter (Signed)
Patient was called and made aware. 

## 2022-12-02 NOTE — Telephone Encounter (Signed)
Pt is very sick from Trulicty 4.5 She said she has been sick to her stomach ever since it was increased. She has vomit/diarrhea. Please advise

## 2022-12-03 ENCOUNTER — Ambulatory Visit: Payer: 59 | Admitting: Nutrition

## 2022-12-03 ENCOUNTER — Ambulatory Visit: Payer: 59 | Admitting: Nurse Practitioner

## 2022-12-10 ENCOUNTER — Ambulatory Visit (HOSPITAL_COMMUNITY): Admission: RE | Admit: 2022-12-10 | Payer: Medicaid Other | Source: Ambulatory Visit

## 2022-12-15 ENCOUNTER — Ambulatory Visit (HOSPITAL_COMMUNITY): Admission: RE | Admit: 2022-12-15 | Payer: Medicaid Other | Source: Ambulatory Visit

## 2022-12-21 ENCOUNTER — Ambulatory Visit: Payer: 59 | Admitting: Urology

## 2022-12-29 ENCOUNTER — Ambulatory Visit: Payer: 59 | Admitting: Nurse Practitioner

## 2022-12-29 ENCOUNTER — Encounter: Payer: 59 | Attending: Nurse Practitioner | Admitting: Nutrition

## 2022-12-29 DIAGNOSIS — R7303 Prediabetes: Secondary | ICD-10-CM | POA: Insufficient documentation

## 2022-12-30 ENCOUNTER — Encounter (HOSPITAL_COMMUNITY): Payer: Self-pay | Admitting: Nurse Practitioner

## 2022-12-31 ENCOUNTER — Other Ambulatory Visit: Payer: Self-pay | Admitting: Family

## 2022-12-31 ENCOUNTER — Encounter: Payer: Self-pay | Admitting: Nurse Practitioner

## 2022-12-31 DIAGNOSIS — Z1231 Encounter for screening mammogram for malignant neoplasm of breast: Secondary | ICD-10-CM

## 2023-01-04 ENCOUNTER — Inpatient Hospital Stay: Admission: RE | Admit: 2023-01-04 | Payer: 59 | Source: Ambulatory Visit

## 2023-01-07 DIAGNOSIS — R768 Other specified abnormal immunological findings in serum: Secondary | ICD-10-CM | POA: Diagnosis not present

## 2023-01-08 LAB — T4, FREE: Free T4: 1.12 ng/dL (ref 0.82–1.77)

## 2023-01-08 LAB — TSH: TSH: 1.12 u[IU]/mL (ref 0.450–4.500)

## 2023-01-11 ENCOUNTER — Ambulatory Visit (HOSPITAL_COMMUNITY)
Admission: RE | Admit: 2023-01-11 | Discharge: 2023-01-11 | Disposition: A | Discharge status: Home / Self Care | Payer: 59 | Source: Ambulatory Visit | Attending: Urology | Admitting: Urology

## 2023-01-11 DIAGNOSIS — N200 Calculus of kidney: Secondary | ICD-10-CM

## 2023-01-13 ENCOUNTER — Encounter: Payer: Self-pay | Admitting: Nurse Practitioner

## 2023-01-13 ENCOUNTER — Ambulatory Visit (INDEPENDENT_AMBULATORY_CARE_PROVIDER_SITE_OTHER): Payer: 59 | Admitting: Nurse Practitioner

## 2023-01-13 VITALS — BP 135/79 | HR 65 | Ht 66.0 in | Wt 303.6 lb

## 2023-01-13 DIAGNOSIS — R7989 Other specified abnormal findings of blood chemistry: Secondary | ICD-10-CM | POA: Diagnosis not present

## 2023-01-13 DIAGNOSIS — R7303 Prediabetes: Secondary | ICD-10-CM

## 2023-01-13 DIAGNOSIS — R768 Other specified abnormal immunological findings in serum: Secondary | ICD-10-CM | POA: Diagnosis not present

## 2023-01-13 LAB — POCT GLYCOSYLATED HEMOGLOBIN (HGB A1C): Hemoglobin A1C: 5.8 % — AB (ref 4.0–5.6)

## 2023-01-13 NOTE — Progress Notes (Signed)
01/13/2023     Endocrinology Follow Up Note    Subjective:    Patient ID: [name], female    DOB: July 23, 1976, PCP Sharion Balloon, FNP.   Past Medical History:  Diagnosis Date   Arthritis    Asthma    COPD (chronic obstructive pulmonary disease) ([place])    sees PCP   Depression    Diabetes mellitus without complication (HCC)    GERD (gastroesophageal reflux disease)    History of kidney stones    Hypertension    pt. denies  never taken meds   Pneumonia    PONV (postoperative nausea and vomiting)     Past Surgical History:  Procedure Laterality Date   ABDOMINAL HYSTERECTOMY     CHOLECYSTECTOMY     CYSTOSCOPY W/ RETROGRADES  08/04/2011   Procedure: CYSTOSCOPY WITH RETROGRADE PYELOGRAM;  Surgeon: Marissa Nestle;  Location: [facility];  Service: Urology;  Laterality: Right;  Right Retrograde   CYSTOSCOPY WITH RETROGRADE PYELOGRAM, URETEROSCOPY AND STENT PLACEMENT Left 07/24/2021   Procedure: CYSTOSCOPY WITH RETROGRADE PYELOGRAM, URETEROSCOPY AND STENT PLACEMENT;  Surgeon: Cleon Gustin, MD;  Location: [facility];  Service: Urology;  Laterality: Left;   CYSTOSCOPY WITH RETROGRADE PYELOGRAM, URETEROSCOPY AND STENT PLACEMENT Bilateral 03/02/2022   Procedure: CYSTOSCOPY WITH RETROGRADE PYELOGRAM, RIGHT URETEROSCOPY AND BILATERAL STENT PLACEMENT;  Surgeon: Cleon Gustin, MD;  Location: [facility];  Service: Urology;  Laterality: Bilateral;   CYSTOSCOPY WITH RETROGRADE PYELOGRAM, URETEROSCOPY AND STENT PLACEMENT Bilateral 03/23/2022   Procedure: CYSTOSCOPY WITH RETROGRADE PYELOGRAM, URETEROSCOPY AND STENT REMOVAL;  Surgeon: Cleon Gustin, MD;  Location: [facility];  Service: Urology;  Laterality: Bilateral;   CYSTOSCOPY WITH RETROGRADE PYELOGRAM, URETEROSCOPY AND STENT PLACEMENT Left 11/02/2022   Procedure: CYSTOSCOPY WITH RETROGRADE PYELOGRAM, URETEROSCOPY AND STENT PLACEMENT;  Surgeon: Cleon Gustin, MD;  Location: [facility];  Service: Urology;  Laterality: Left;    HOLMIUM LASER APPLICATION N/A [id number]   Procedure: HOLMIUM LASER APPLICATION;  Surgeon: Ardis Hughs, MD;  Location: [facility];  Service: Urology;  Laterality: N/A;   HOLMIUM LASER APPLICATION Left 0000000   Procedure: HOLMIUM LASER APPLICATION;  Surgeon: Cleon Gustin, MD;  Location: [facility];  Service: Urology;  Laterality: Left;   HOLMIUM LASER APPLICATION Bilateral 0000000   Procedure: HOLMIUM LASER APPLICATION;  Surgeon: Cleon Gustin, MD;  Location: [facility];  Service: Urology;  Laterality: Bilateral;   HOLMIUM LASER APPLICATION Left [id number]   Procedure: HOLMIUM LASER APPLICATION;  Surgeon: Cleon Gustin, MD;  Location: [facility];  Service: Urology;  Laterality: Left;   LITHOTRIPSY     NEPHROLITHOTOMY Right 04/15/2018   Procedure: RIGHT NEPHROLITHOTOMY PERCUTANEOUS WITH ACCESS;  Surgeon: Ardis Hughs, MD;  Location: [facility];  Service: Urology;  Laterality: Right;   NEPHROLITHOTOMY Right 12/29/2018   Procedure: NEPHROLITHOTOMY PERCUTANEOUS;  Surgeon: Ardis Hughs, MD;  Location: [facility];  Service: Urology;  Laterality: Right;   PERCUTANEOUS NEPHROLITHOTRIPSY  2012   both sides kidney    STONE EXTRACTION WITH BASKET Bilateral 03/02/2022   Procedure: STONE EXTRACTION WITH BASKET;  Surgeon: Cleon Gustin, MD;  Location: [facility];  Service: Urology;  Laterality: Bilateral;   STONE EXTRACTION WITH BASKET Bilateral 03/23/2022   Procedure: STONE EXTRACTION WITH BASKET;  Surgeon: Cleon Gustin, MD;  Location: [facility];  Service: Urology;  Laterality: Bilateral;   TUBAL LIGATION      Social History   Socioeconomic History   Marital status: Single    Spouse  name: Not on file   Number of children: Not on file   Years of education: Not on file   Highest education level: Not on file  Occupational History   Not on file  Tobacco Use   Smoking status: Former    Packs/day: 0.50    Years: 18.00    Total pack years: 9.00    Types: Cigarettes    Quit  date: 01/21/2021    Years since quitting: 1.9   Smokeless tobacco: Never  Vaping Use   Vaping Use: Never used  Substance and Sexual Activity   Alcohol use: No   Drug use: No   Sexual activity: Yes    Birth control/protection: Surgical  Other Topics Concern   Not on file  Social History Narrative   Not on file   Social Determinants of Health   Financial Resource Strain: Not on file  Food Insecurity: Not on file  Transportation Needs: Not on file  Physical Activity: Not on file  Stress: Not on file  Social Connections: Not on file    Family History  Problem Relation Age of Onset   COPD Mother    Cancer Father    COPD Father    Heart attack Father     Outpatient Encounter Medications as of 01/13/2023  Medication Sig   allopurinol (ZYLOPRIM) 300 MG tablet Take 1 tablet (300 mg total) by mouth daily.   indapamide (LOZOL) 2.5 MG tablet Take 1 tablet (2.5 mg total) by mouth daily.   Multiple Vitamin (MULTIVITAMIN WITH MINERALS) TABS tablet Take 1 tablet by mouth daily.   ondansetron (ZOFRAN) 4 MG tablet Take 1 tablet (4 mg total) by mouth every 8 (eight) hours as needed for nausea or vomiting.   Potassium Citrate (UROCIT-K 15) 15 MEQ (1620 MG) TBCR Take 1 tablet by mouth in the morning and at bedtime.   dicyclomine (BENTYL) 20 MG tablet Take 20 mg by mouth 4 (four) times daily as needed for spasms.   [DISCONTINUED] cetirizine (ZYRTEC) 10 MG tablet Take 10 mg by mouth daily as needed for allergies. (Patient not taking: Reported on 01/13/2023)   [DISCONTINUED] diazepam (VALIUM) 5 MG tablet Take 1 tablet (5 mg total) by mouth 2 (two) times daily. (Patient not taking: Reported on 10/21/2022)   [DISCONTINUED] Dulaglutide (TRULICITY) 4.5 0000000 SOPN Inject 4.5 mg as directed once a week. (Patient not taking: Reported on 01/13/2023)   [DISCONTINUED] oxybutynin (DITROPAN) 5 MG tablet Take 1 tablet (5 mg total) by mouth 3 (three) times daily. (Patient not taking: Reported on 10/21/2022)    [DISCONTINUED] oxyCODONE (ROXICODONE) 5 MG immediate release tablet Take 1 tablet (5 mg total) by mouth every 4 (four) hours as needed for severe pain. (Patient not taking: Reported on 01/13/2023)   [DISCONTINUED] oxyCODONE-acetaminophen (PERCOCET/ROXICET) 5-325 MG tablet Take 1 tablet by mouth every 4 (four) hours as needed for severe pain. (Patient not taking: Reported on 01/13/2023)   No facility-administered encounter medications on file as of 01/13/2023.    ALLERGIES: Allergies  Allergen Reactions   Hydrocodone Nausea And Vomiting   Morphine And Related Hives and Other (See Comments)    Welps   Vicodin [Hydrocodone-Acetaminophen] Nausea And Vomiting    VACCINATION STATUS: Immunization History  Administered Date(s) Administered   Influenza,inj,Quad PF,6+ Mos 09/11/2014, 09/05/2015, 09/01/2016, 12/30/2018     HPI  [name] is 47 y.o. female who presents today with a medical history as above. she is being seen in follow up after being seen in consultation for  hyperthyroidism requested by Sharion Balloon, FNP.  she has been dealing with symptoms of progressive weight gain, fatigue, kidney stones (multiple), and constipation, depression, heat intolerance, palpitations and tremors for about a year. These symptoms are progressively worsening and troubling to her.    She does note intermittent hoarseness.  she denies dysphagia, choking, shortness of breath.   she does have family history of thyroid dysfunction in her mothers side including some of her sisters and nieces, but denies family hx of thyroid cancer. she denies personal history of goiter. she is not on any anti-thyroid medications nor on any thyroid hormone supplements. Denies use of Biotin containing supplements other than a womens formulated MVI.  she is willing to proceed with appropriate work up and therapy for thyrotoxicosis.  Review of systems  Constitutional: + increasing body weight-regaining some previously  lost with Trulicity,  current Body mass index is 49 kg/m. , no fatigue, no subjective hyperthermia, no subjective hypothermia Eyes: no blurry vision, no xerophthalmia ENT: no sore throat, no nodules palpated in throat, no dysphagia/odynophagia, no hoarseness Cardiovascular: no chest pain, no shortness of breath, no palpitations, no leg swelling Respiratory: no cough, no shortness of breath Gastrointestinal: no nausea/vomiting/diarrhea Musculoskeletal: no muscle/joint aches Skin: no rashes, no hyperemia Neurological: no tremors, no numbness, no tingling, no dizziness Psychiatric: + depression-regarding inability to lose weight, no anxiety   Objective:    BP 135/79 (BP Location: Right Arm, Patient Position: Sitting, Cuff Size: Large)   Pulse 65   Ht 5' 6"$  (1.676 m)   Wt (!) 303 lb 9.6 oz (137.7 kg)   LMP 11/05/2015   BMI 49.00 kg/m   Wt Readings from Last 3 Encounters:  01/13/23 (!) 303 lb 9.6 oz (137.7 kg)  11/02/22 298 lb 15.1 oz (135.6 kg)  10/28/22 298 lb 15.1 oz (135.6 kg)     BP Readings from Last 3 Encounters:  01/13/23 135/79  11/09/22 139/85  11/02/22 (!) 117/57                          Physical Exam- Limited  Constitutional:  Body mass index is 49 kg/m. , not in acute distress, frustrated state of mind Eyes:  EOMI, no exophthalmos Musculoskeletal: no gross deformities, strength intact in all four extremities, no gross restriction of joint movements Skin:  no rashes, no hyperemia Neurological: no tremor with outstretched hands   CMP     Component Value Date/Time   NA 138 10/28/2022 1318   NA 139 06/03/2022 0925   K 3.3 (L) 10/28/2022 1318   CL 106 10/28/2022 1318   CO2 24 10/28/2022 1318   GLUCOSE 109 (H) 10/28/2022 1318   BUN 12 10/28/2022 1318   BUN 10 06/03/2022 0925   CREATININE 0.78 10/28/2022 1318   CALCIUM 8.4 (L) 10/28/2022 1318   PROT 7.2 03/07/2022 0604   PROT 7.3 02/06/2022 1043   ALBUMIN 3.3 (L) 03/07/2022 0604   ALBUMIN 3.9 02/06/2022  1043   AST 18 03/07/2022 0604   ALT 27 03/07/2022 0604   ALKPHOS 72 03/07/2022 0604   BILITOT 0.8 03/07/2022 0604   BILITOT 0.2 02/06/2022 1043   GFRNONAA >60 10/28/2022 1318   GFRAA >60 10/12/2019 0745     CBC    Component Value Date/Time   WBC 10.4 03/20/2022 1100   RBC 4.33 03/20/2022 1100   HGB 13.0 03/20/2022 1100   HGB 13.9 02/06/2022 1043   HCT 40.2 03/20/2022 1100   HCT 41.6  02/06/2022 1043   PLT 300 03/20/2022 1100   PLT 304 02/06/2022 1043   MCV 92.8 03/20/2022 1100   MCV 91 02/06/2022 1043   MCH 30.0 03/20/2022 1100   MCHC 32.3 03/20/2022 1100   RDW 12.9 03/20/2022 1100   RDW 12.6 02/06/2022 1043   LYMPHSABS 3.0 03/20/2022 1100   LYMPHSABS 2.6 02/06/2022 1043   MONOABS 0.7 03/20/2022 1100   EOSABS 0.1 03/20/2022 1100   EOSABS 0.1 02/06/2022 1043   BASOSABS 0.0 03/20/2022 1100   BASOSABS 0.1 02/06/2022 1043     Diabetic Labs (most recent): Lab Results  Component Value Date   HGBA1C 5.8 (A) 01/13/2023   HGBA1C 5.6 09/02/2022   HGBA1C 6.1 04/30/2022    Lipid Panel     Component Value Date/Time   CHOL 160 02/06/2022 1043   TRIG 169 (H) 02/06/2022 1043   HDL 40 02/06/2022 1043   CHOLHDL 4.0 02/06/2022 1043   LDLCALC 91 02/06/2022 1043   LABVLDL 29 02/06/2022 1043     Lab Results  Component Value Date   TSH 1.120 01/07/2023   TSH 1.310 08/12/2022   TSH 0.723 04/02/2022   TSH 0.382 (L) 02/06/2022   FREET4 1.12 01/07/2023   FREET4 1.54 08/12/2022   FREET4 1.23 04/02/2022   FREET4 CANCELED 02/06/2022     Latest Reference Range & Units 02/06/22 10:43 04/02/22 08:25 08/12/22 08:28 01/07/23 10:12  TSH 0.450 - 4.500 uIU/mL 0.382 (L) 0.723 1.310 1.120  Triiodothyronine,Free,Serum 2.0 - 4.4 pg/mL  3.4    T4,Free(Direct) 0.82 - 1.77 ng/dL CANCELED 1.23 1.54 1.12  Thyroperoxidase Ab SerPl-aCnc 0 - 34 IU/mL  16    Thyroglobulin Antibody 0.0 - 0.9 IU/mL  3.1 (H)    (L): Data is abnormally low (H): Data is abnormally high    Assessment & Plan:    1. Thyroid antibody positive  Her thyroid function tests show positive antibodies indicating autoimmune thyroid dysfunction.    Her repeat labs still indicate euthyroid presentation.  She will need routine follow up to determine if/when additional thyroid testing and intervention will be needed.    2. Prediabetes  Her A1c today is 5.8%, increasing slightly from last visit of 5.6%.  She had to stop her Trulicity for sour stomach causing nausea, vomiting, diarrhea. She is advised to bring her food journal with her to her next visit so we can look deeper into it.  The following Lifestyle Medicine recommendations according to [facility]) were discussed and offered to patient and she agrees to start the journey:  A. Whole Foods, Plant-based plate comprising of fruits and vegetables, plant-based proteins, whole-grain carbohydrates was discussed in detail with the patient.   A list for source of those nutrients were also provided to the patient.  Patient will use only water or unsweetened tea for hydration. B.  The need to stay away from risky substances including alcohol, smoking; obtaining 7 to 9 hours of restorative sleep, at least 150 minutes of moderate intensity exercise weekly, the importance of healthy social connections,  and stress reduction techniques were discussed. C.  A full color page of  Calorie density of various food groups per pound showing examples of each food groups was provided to the patient.  - Nutritional counseling repeated at each appointment due to patients tendency to fall back in to old habits.  - The patient admits there is a room for improvement in their diet and drink choices. -  Suggestion is made for the patient  to avoid simple carbohydrates from their diet including Cakes, Sweet Desserts / Pastries, Ice Cream, Soda (diet and regular), Sweet Tea, Candies, Chips, Cookies, Sweet Pastries, Store Bought Juices, Alcohol in Excess of 1-2  drinks a day, Artificial Sweeteners, Coffee Creamer, and "Sugar-free" Products. This will help patient to have stable blood glucose profile and potentially avoid unintended weight gain.   - I encouraged the patient to switch to unprocessed or minimally processed complex starch and increased protein intake (animal or plant source), fruits, and vegetables.   - Patient is advised to stick to a routine mealtimes to eat 3 meals a day and avoid unnecessary snacks (to snack only to correct hypoglycemia).    -Patient is advised to maintain close follow up with Sharion Balloon, FNP for primary care needs.  I did check PTH given her history of kidney stones to rule out parathyroid involvement, labs were normal.  I also checked am cortisol level to assess adrenal function which was normal as well.      I spent  48  minutes in the care of the patient today including review of labs from [place], Lipids, Thyroid Function, Hematology (current and previous including abstractions from other facilities); face-to-face time discussing  her blood glucose readings/logs, discussing hypoglycemia and hyperglycemia episodes and symptoms, medications doses, her options of short and long term treatment based on the latest standards of care / guidelines;  discussion about incorporating lifestyle medicine;  and documenting the encounter. Risk reduction counseling performed per USPSTF guidelines to reduce obesity and cardiovascular risk factors.     Please refer to Patient Instructions for Blood Glucose Monitoring and Insulin/Medications Dosing Guide"  in media tab for additional information. Please  also refer to " Patient Self Inventory" in the Media  tab for reviewed elements of pertinent patient history.  [name] participated in the discussions, expressed understanding, and voiced agreement with the above plans.  All questions were answered to her satisfaction. she is encouraged to contact clinic should she have any  questions or concerns prior to her return visit.  Follow up plan: Return in about 3 months (around 04/13/2023) for prediabetes follow up with A1c in office..   Thank you for involving me in the care of this pleasant patient, and I will continue to update you with her progress.   [name], [facility] [address] [place],  [address] Phone: [phone] Fax: [phone]  01/13/2023, 1:12 PM

## 2023-01-15 ENCOUNTER — Encounter: Payer: Self-pay | Admitting: Family

## 2023-01-15 ENCOUNTER — Ambulatory Visit: Payer: 59 | Admitting: Family Medicine

## 2023-01-20 ENCOUNTER — Encounter: Payer: Self-pay | Admitting: Urology

## 2023-01-20 ENCOUNTER — Ambulatory Visit
Admission: RE | Admit: 2023-01-20 | Discharge: 2023-01-20 | Disposition: A | Discharge status: Home / Self Care | Payer: 59 | Source: Ambulatory Visit

## 2023-01-20 ENCOUNTER — Ambulatory Visit (INDEPENDENT_AMBULATORY_CARE_PROVIDER_SITE_OTHER): Payer: 59 | Admitting: Urology

## 2023-01-20 VITALS — BP 125/79 | HR 84

## 2023-01-20 DIAGNOSIS — N200 Calculus of kidney: Secondary | ICD-10-CM | POA: Diagnosis not present

## 2023-01-20 DIAGNOSIS — Z1231 Encounter for screening mammogram for malignant neoplasm of breast: Secondary | ICD-10-CM | POA: Diagnosis not present

## 2023-01-20 MED ORDER — ALLOPURINOL 300 MG PO TABS: 300.0000 mg | ORAL_TABLET | Freq: Every day | ORAL | 11 refills | Status: DC

## 2023-01-20 MED ORDER — POTASSIUM CITRATE ER 15 MEQ (1620 MG) PO TBCR: 1.0000 | EXTENDED_RELEASE_TABLET | Freq: Every day | ORAL | 11 refills | Status: DC

## 2023-01-20 MED ORDER — INDAPAMIDE 2.5 MG PO TABS: 2.5000 mg | ORAL_TABLET | Freq: Every day | ORAL | 11 refills | Status: DC

## 2023-01-20 NOTE — Progress Notes (Unsigned)
01/20/2023 3:05 PM   [name] 1976/05/24 [id number]  Referring provider: Ivy Lynn, NP No address on file  No chief complaint on file.   HPI: Renal US shows possible right lower pole calculus. Urine pH 6.0. Stone composition CaPhos   PMH: Past Medical History:  Diagnosis Date   Arthritis    Asthma    COPD (chronic obstructive pulmonary disease) ([place])    sees PCP   Depression    Diabetes mellitus without complication (HCC)    GERD (gastroesophageal reflux disease)    History of kidney stones    Hypertension    pt. denies  never taken meds   Pneumonia    PONV (postoperative nausea and vomiting)     Surgical History: Past Surgical History:  Procedure Laterality Date   ABDOMINAL HYSTERECTOMY     CHOLECYSTECTOMY     CYSTOSCOPY W/ RETROGRADES  08/04/2011   Procedure: CYSTOSCOPY WITH RETROGRADE PYELOGRAM;  Surgeon: Marissa Nestle;  Location: [facility];  Service: Urology;  Laterality: Right;  Right Retrograde   CYSTOSCOPY WITH RETROGRADE PYELOGRAM, URETEROSCOPY AND STENT PLACEMENT Left 07/24/2021   Procedure: CYSTOSCOPY WITH RETROGRADE PYELOGRAM, URETEROSCOPY AND STENT PLACEMENT;  Surgeon: Cleon Gustin, MD;  Location: [facility];  Service: Urology;  Laterality: Left;   CYSTOSCOPY WITH RETROGRADE PYELOGRAM, URETEROSCOPY AND STENT PLACEMENT Bilateral 03/02/2022   Procedure: CYSTOSCOPY WITH RETROGRADE PYELOGRAM, RIGHT URETEROSCOPY AND BILATERAL STENT PLACEMENT;  Surgeon: Cleon Gustin, MD;  Location: [facility];  Service: Urology;  Laterality: Bilateral;   CYSTOSCOPY WITH RETROGRADE PYELOGRAM, URETEROSCOPY AND STENT PLACEMENT Bilateral 03/23/2022   Procedure: CYSTOSCOPY WITH RETROGRADE PYELOGRAM, URETEROSCOPY AND STENT REMOVAL;  Surgeon: Cleon Gustin, MD;  Location: [facility];  Service: Urology;  Laterality: Bilateral;   CYSTOSCOPY WITH RETROGRADE PYELOGRAM, URETEROSCOPY AND STENT PLACEMENT Left 11/02/2022   Procedure: CYSTOSCOPY WITH RETROGRADE PYELOGRAM,  URETEROSCOPY AND STENT PLACEMENT;  Surgeon: Cleon Gustin, MD;  Location: [facility];  Service: Urology;  Laterality: Left;   HOLMIUM LASER APPLICATION N/A [id number]   Procedure: HOLMIUM LASER APPLICATION;  Surgeon: Ardis Hughs, MD;  Location: [facility];  Service: Urology;  Laterality: N/A;   HOLMIUM LASER APPLICATION Left 0000000   Procedure: HOLMIUM LASER APPLICATION;  Surgeon: Cleon Gustin, MD;  Location: [facility];  Service: Urology;  Laterality: Left;   HOLMIUM LASER APPLICATION Bilateral 0000000   Procedure: HOLMIUM LASER APPLICATION;  Surgeon: Cleon Gustin, MD;  Location: [facility];  Service: Urology;  Laterality: Bilateral;   HOLMIUM LASER APPLICATION Left [id number]   Procedure: HOLMIUM LASER APPLICATION;  Surgeon: Cleon Gustin, MD;  Location: [facility];  Service: Urology;  Laterality: Left;   LITHOTRIPSY     NEPHROLITHOTOMY Right 04/15/2018   Procedure: RIGHT NEPHROLITHOTOMY PERCUTANEOUS WITH ACCESS;  Surgeon: Ardis Hughs, MD;  Location: [facility];  Service: Urology;  Laterality: Right;   NEPHROLITHOTOMY Right 12/29/2018   Procedure: NEPHROLITHOTOMY PERCUTANEOUS;  Surgeon: Ardis Hughs, MD;  Location: [facility];  Service: Urology;  Laterality: Right;   PERCUTANEOUS NEPHROLITHOTRIPSY  2012   both sides kidney    STONE EXTRACTION WITH BASKET Bilateral 03/02/2022   Procedure: STONE EXTRACTION WITH BASKET;  Surgeon: Cleon Gustin, MD;  [facility]: [facility];  Service: Urology;  Laterality: Bilateral;   STONE EXTRACTION WITH BASKET Bilateral 03/23/2022   Procedure: STONE EXTRACTION WITH BASKET;  Surgeon: Cleon Gustin, MD;  Location: [facility];  Service: Urology;  Laterality: Bilateral;   TUBAL LIGATION      Home Medications:  Allergies as of 01/20/2023       Reactions   Hydrocodone Nausea And Vomiting   Morphine And Related Hives, Other (See Comments)   Welps   Vicodin [hydrocodone-acetaminophen] Nausea And Vomiting        Medication List         Accurate as of January 20, 2023  3:05 PM. If you have any questions, ask your nurse or doctor.          allopurinol 300 MG tablet Commonly known as: ZYLOPRIM Take 1 tablet (300 mg total) by mouth daily.   dicyclomine 20 MG tablet Commonly known as: BENTYL Take 20 mg by mouth 4 (four) times daily as needed for spasms.   indapamide 2.5 MG tablet Commonly known as: LOZOL Take 1 tablet (2.5 mg total) by mouth daily.   multivitamin with minerals Tabs tablet Take 1 tablet by mouth daily.   ondansetron 4 MG tablet Commonly known as: Zofran Take 1 tablet (4 mg total) by mouth every 8 (eight) hours as needed for nausea or vomiting.   Potassium Citrate 15 MEQ (1620 MG) Tbcr Commonly known as: Urocit-K 15 Take 1 tablet by mouth in the morning and at bedtime.        Allergies:  Allergies  Allergen Reactions   Hydrocodone Nausea And Vomiting   Morphine And Related Hives and Other (See Comments)    Welps   Vicodin [Hydrocodone-Acetaminophen] Nausea And Vomiting    Family History: Family History  Problem Relation Age of Onset   COPD Mother    Cancer Father    COPD Father    Heart attack Father     Social History:  reports that she quit smoking about 1 years ago. Her smoking use included cigarettes. She has a 9.00 pack-year smoking history. She has never used smokeless tobacco. She reports that she does not drink alcohol and does not use drugs.  ROS: All other review of systems were reviewed and are negative except what is noted above in HPI  Physical Exam: BP 125/79   Pulse 84   LMP 11/05/2015   Constitutional:  Alert and oriented, No acute distress. HEENT: [place] AT, moist mucus membranes.  Trachea midline, no masses. Cardiovascular: No clubbing, cyanosis, or edema. Respiratory: Normal respiratory effort, no increased work of breathing. GI: Abdomen is soft, nontender, nondistended, no abdominal masses GU: No CVA tenderness.  Lymph: No cervical or inguinal  lymphadenopathy. Skin: No rashes, bruises or suspicious lesions. Neurologic: Grossly intact, no focal deficits, moving all 4 extremities. Psychiatric: Normal mood and affect.  Laboratory Data: Lab Results  Component Value Date   WBC 10.4 03/20/2022   HGB 13.0 03/20/2022   HCT 40.2 03/20/2022   MCV 92.8 03/20/2022   PLT 300 03/20/2022    Lab Results  Component Value Date   CREATININE 0.78 10/28/2022    No results found for: "PSA"  No results found for: "TESTOSTERONE"  Lab Results  Component Value Date   HGBA1C 5.8 (A) 01/13/2023    Urinalysis    Component Value Date/Time   COLORURINE AMBER (A) 03/20/2022 0932   APPEARANCEUR Cloudy (A) 11/09/2022 1018   LABSPEC 1.015 03/20/2022 0932   PHURINE 8.0 03/20/2022 0932   GLUCOSEU Negative 11/09/2022 1018   HGBUR LARGE (A) 03/20/2022 0932   BILIRUBINUR Negative 11/09/2022 1018   KETONESUR NEGATIVE 03/20/2022 0932   PROTEINUR 3+ (A) 11/09/2022 1018   PROTEINUR 100 (A) 03/20/2022 0932   UROBILINOGEN 0.2 02/12/2015 1932   NITRITE Negative 11/09/2022 1018  NITRITE NEGATIVE 03/20/2022 0932   LEUKOCYTESUR Trace (A) 11/09/2022 1018   LEUKOCYTESUR MODERATE (A) 03/20/2022 0932    Lab Results  Component Value Date   LABMICR See below: 11/09/2022   WBCUA 0-5 11/09/2022   LABEPIT 0-10 11/09/2022   MUCUS Present 02/10/2022   BACTERIA Few (A) 11/09/2022    Pertinent Imaging: *** Results for orders placed during the hospital encounter of 07/02/21  DG Abd 1 View  Narrative CLINICAL DATA:  Left-sided flank pain.  EXAM: ABDOMEN - 1 VIEW  COMPARISON:  Ultrasound renal 07/02/2021, CT renal 10/10/2019  FINDINGS: Right upper quadrant surgical clips. The bowel gas pattern is normal. No radio-opaque calculi or other significant radiographic abnormality are seen.  IMPRESSION: Negative radiograph.  These results and ultrasound renal results from same day will be called to the ordering clinician or representative by  the Radiologist Assistant, and communication documented in the PACS or [employer].   Electronically Signed By: Iven Finn M.D. On: 07/03/2021 21:41  No results found for this or any previous visit.  No results found for this or any previous visit.  No results found for this or any previous visit.  Results for orders placed during the hospital encounter of 01/11/23  Ultrasound renal complete  Narrative CLINICAL DATA:  History of nephrolithiasis.  EXAM: RENAL / URINARY TRACT ULTRASOUND COMPLETE  COMPARISON:  CT abdomen pelvis dated 09/16/2022 and renal ultrasound dated 08/24/2022.  FINDINGS: Right Kidney:  Renal measurements: 13.0 x 5.9 x 6.5 cm = volume: 259 mL. Echogenicity within normal limits. A nonobstructive calculus measures 9 mm in size.  Left Kidney:  Renal measurements: 13.8 x 6.2 x 7.0 cm = volume: 312 mL. Echogenicity within normal limits. A nonobstructive calculus measures 9 mm in size. A cyst in the inferior pole measures 2.1 x 1.5 x 1.7 cm.  Bladder:  Appears normal for degree of bladder distention. Prevoid bladder volume measures 104 cc.  Other:  None.  IMPRESSION: Bilateral nonobstructive renal calculi. No hydronephrosis.   Electronically Signed By: Zerita Boers M.D. On: 01/11/2023 16:57  No valid procedures specified. No results found for this or any previous visit.  Results for orders placed in visit on 09/08/22  CT RENAL STONE STUDY  Narrative CLINICAL DATA:  Left flank pain.  Nephrolithiasis.  EXAM: CT ABDOMEN AND PELVIS WITHOUT CONTRAST  TECHNIQUE: Multidetector CT imaging of the abdomen and pelvis was performed following the standard protocol without IV contrast.  RADIATION DOSE REDUCTION: This exam was performed according to the departmental dose-optimization program which includes automated exposure control, adjustment of the mA and/or kV according to patient size and/or use of iterative reconstruction  technique.  COMPARISON:  03/20/2022  FINDINGS: Lower chest: No acute findings.  Hepatobiliary: No mass visualized on this unenhanced exam. Moderate diffuse hepatic steatosis again noted. Prior cholecystectomy. No evidence of biliary obstruction.  Pancreas: No mass or inflammatory process visualized on this unenhanced exam.  Spleen:  Within normal limits in size.  Adrenals/Urinary tract: Bilateral ureteral stents have been removed since prior study. A few tiny less than 5 mm renal calculi are seen bilaterally. Moderate left hydronephrosis is seen due to a 7 mm calculus at the left UPJ.  Stomach/Bowel: Stable small hiatal hernia. No evidence of obstruction, inflammatory process, or abnormal fluid collections. Normal appendix visualized. Mild diverticulosis is seen involving the sigmoid colon, however there is no evidence of diverticulitis.  Vascular/Lymphatic: No pathologically enlarged lymph nodes identified. No evidence of abdominal aortic aneurysm.  Reproductive: Prior hysterectomy noted.  Adnexal regions are unremarkable in appearance.  Other:  None.  Musculoskeletal:  No suspicious bone lesions identified.  IMPRESSION: Moderate left hydronephrosis due to 7 mm calculus at the left UPJ.  Bilateral nephrolithiasis.  Stable small hiatal hernia.  Moderate hepatic steatosis.  Mild sigmoid diverticulosis, without radiographic evidence of diverticulitis.   Electronically Signed By: Marlaine Hind M.D. On: 09/17/2022 12:32   Assessment & Plan:    1. Bilateral renal stones -continue indapamide, continue allopurinol. Decrease UrocitK to 79mq daily - Urinalysis, Routine w reflex microscopic   No follow-ups on file.  PNicolette Bang MD  [facility] [place]

## 2023-01-20 NOTE — Patient Instructions (Signed)

## 2023-01-21 LAB — URINALYSIS, ROUTINE W REFLEX MICROSCOPIC
Bilirubin, UA: NEGATIVE
Glucose, UA: NEGATIVE
Ketones, UA: NEGATIVE
Leukocytes,UA: NEGATIVE
Nitrite, UA: NEGATIVE
Protein,UA: NEGATIVE
Specific Gravity, UA: 1.02 (ref 1.005–1.030)
Urobilinogen, Ur: 0.2 mg/dL (ref 0.2–1.0)
pH, UA: 6 (ref 5.0–7.5)

## 2023-01-21 LAB — MICROSCOPIC EXAMINATION: Bacteria, UA: NONE SEEN

## 2023-01-27 ENCOUNTER — Telehealth: Payer: 59 | Admitting: Nurse Practitioner

## 2023-01-27 DIAGNOSIS — J020 Streptococcal pharyngitis: Secondary | ICD-10-CM | POA: Diagnosis not present

## 2023-01-27 MED ORDER — AMOXICILLIN 500 MG PO CAPS: 500.0000 mg | ORAL_CAPSULE | Freq: Two times a day (BID) | ORAL | 0 refills | Status: AC

## 2023-01-27 NOTE — Progress Notes (Signed)

## 2023-03-19 ENCOUNTER — Telehealth: Payer: 59 | Admitting: Nurse Practitioner

## 2023-03-19 DIAGNOSIS — R197 Diarrhea, unspecified: Secondary | ICD-10-CM

## 2023-03-19 NOTE — Progress Notes (Signed)
Because your stool is dark/black and your symptoms have been ongoing, I feel your condition warrants further evaluation and I recommend that you be seen in a face to face visit.   You can start by calling your primary care office, if they cannot see you today I recommend an Urgent Care visit   NOTE: There will be NO CHARGE for this eVisit   If you are having a true medical emergency please call 911.      For an urgent face to face visit, [place] has eight urgent care centers for your convenience:   NEW!! [facility] Health Urgent Care Center at [facility] Get Driving Directions [phone] [address] [place], [address]    [facility] Center at [facility] Get Driving Directions [phone] [address] [place], [place] [address]   [facility] Health Urgent Care Center [facility]) Get Driving Directions [phone] [address] [place], [place] [address]  [facility] Health Urgent Care Center [facility]) Get Driving Directions [phone] [address] [place],  [place]  [address]  [facility] Health Urgent Care Center [facility] - at [employer]  [phone] [place],  [place] [address]   [facility] Health Urgent Care at [facility] Get Driving Directions [phone] [address] [place], [place] [address]   [facility] Health Urgent Care at [facility] Get Driving Directions  [phone] [address] [place], [place] [address]   [facility] Health Urgent Care at [facility] Directions [phone] [address], [address] [place], [place] [address]  Your MyChart E-visit questionnaire answers were reviewed by a board certified advanced clinical practitioner to complete your personal care plan based on your specific symptoms.  Thank you for using e-Visits.

## 2023-04-13 ENCOUNTER — Encounter: Payer: Self-pay | Admitting: Nurse Practitioner

## 2023-04-13 ENCOUNTER — Ambulatory Visit (INDEPENDENT_AMBULATORY_CARE_PROVIDER_SITE_OTHER): Payer: 59 | Admitting: Nurse Practitioner

## 2023-04-13 VITALS — BP 139/68 | HR 82 | Ht 66.0 in | Wt 307.0 lb

## 2023-04-13 DIAGNOSIS — R7303 Prediabetes: Secondary | ICD-10-CM | POA: Diagnosis not present

## 2023-04-13 DIAGNOSIS — R768 Other specified abnormal immunological findings in serum: Secondary | ICD-10-CM

## 2023-04-13 DIAGNOSIS — R7989 Other specified abnormal findings of blood chemistry: Secondary | ICD-10-CM

## 2023-04-13 DIAGNOSIS — E559 Vitamin D deficiency, unspecified: Secondary | ICD-10-CM | POA: Diagnosis not present

## 2023-04-13 LAB — POCT GLYCOSYLATED HEMOGLOBIN (HGB A1C): Hemoglobin A1C: 5.9 % — AB (ref 4.0–5.6)

## 2023-04-13 MED ORDER — ZEPBOUND 2.5 MG/0.5ML ~~LOC~~ SOAJ: 2.5000 mg | SUBCUTANEOUS | 0 refills | Status: DC

## 2023-04-13 MED ORDER — ZEPBOUND 5 MG/0.5ML ~~LOC~~ SOAJ: 5.0000 mg | SUBCUTANEOUS | 1 refills | Status: DC

## 2023-04-13 NOTE — Progress Notes (Signed)
04/13/2023     Endocrinology Follow Up Note    Subjective:    Patient ID: [name], female    DOB: 1976-03-17, PCP Junie Spencer, FNP.   Past Medical History:  Diagnosis Date   Arthritis    Asthma    COPD (chronic obstructive pulmonary disease) (HCC)    sees PCP   Depression    Diabetes mellitus without complication (HCC)    GERD (gastroesophageal reflux disease)    History of kidney stones    Hypertension    pt. denies  never taken meds   Pneumonia    PONV (postoperative nausea and vomiting)     Past Surgical History:  Procedure Laterality Date   ABDOMINAL HYSTERECTOMY     CHOLECYSTECTOMY     CYSTOSCOPY W/ RETROGRADES  08/04/2011   Procedure: CYSTOSCOPY WITH RETROGRADE PYELOGRAM;  Surgeon: Ky Barban;  Location: [facility];  Service: Urology;  Laterality: Right;  Right Retrograde   CYSTOSCOPY WITH RETROGRADE PYELOGRAM, URETEROSCOPY AND STENT PLACEMENT Left 07/24/2021   Procedure: CYSTOSCOPY WITH RETROGRADE PYELOGRAM, URETEROSCOPY AND STENT PLACEMENT;  Surgeon: Malen Gauze, MD;  Location: [facility];  Service: Urology;  Laterality: Left;   CYSTOSCOPY WITH RETROGRADE PYELOGRAM, URETEROSCOPY AND STENT PLACEMENT Bilateral 03/02/2022   Procedure: CYSTOSCOPY WITH RETROGRADE PYELOGRAM, RIGHT URETEROSCOPY AND BILATERAL STENT PLACEMENT;  Surgeon: Malen Gauze, MD;  Location: [facility];  Service: Urology;  Laterality: Bilateral;   CYSTOSCOPY WITH RETROGRADE PYELOGRAM, URETEROSCOPY AND STENT PLACEMENT Bilateral 03/23/2022   Procedure: CYSTOSCOPY WITH RETROGRADE PYELOGRAM, URETEROSCOPY AND STENT REMOVAL;  Surgeon: Malen Gauze, MD;  Location: [facility];  Service: Urology;  Laterality: Bilateral;   CYSTOSCOPY WITH RETROGRADE PYELOGRAM, URETEROSCOPY AND STENT PLACEMENT Left 11/02/2022   Procedure: CYSTOSCOPY WITH RETROGRADE PYELOGRAM, URETEROSCOPY AND STENT PLACEMENT;  Surgeon: Malen Gauze, MD;  Location: [facility];  Service: Urology;  Laterality: Left;    HOLMIUM LASER APPLICATION N/A 04/15/2018   Procedure: HOLMIUM LASER APPLICATION;  Surgeon: Crist Fat, MD;  Location: [facility];  Service: Urology;  Laterality: N/A;   HOLMIUM LASER APPLICATION Left 07/24/2021   Procedure: HOLMIUM LASER APPLICATION;  Surgeon: Malen Gauze, MD;  Location: [facility];  Service: Urology;  Laterality: Left;   HOLMIUM LASER APPLICATION Bilateral 03/02/2022   Procedure: HOLMIUM LASER APPLICATION;  Surgeon: Malen Gauze, MD;  Location: [facility];  Service: Urology;  Laterality: Bilateral;   HOLMIUM LASER APPLICATION Left 11/02/2022   Procedure: HOLMIUM LASER APPLICATION;  Surgeon: Malen Gauze, MD;  Location: [facility];  Service: Urology;  Laterality: Left;   LITHOTRIPSY     NEPHROLITHOTOMY Right 04/15/2018   Procedure: RIGHT NEPHROLITHOTOMY PERCUTANEOUS WITH ACCESS;  Surgeon: Crist Fat, MD;  Location: [facility];  Service: Urology;  Laterality: Right;   NEPHROLITHOTOMY Right 12/29/2018   Procedure: NEPHROLITHOTOMY PERCUTANEOUS;  Surgeon: Crist Fat, MD;  Location: [facility];  Service: Urology;  Laterality: Right;   PERCUTANEOUS NEPHROLITHOTRIPSY  2012   both sides kidney    STONE EXTRACTION WITH BASKET Bilateral 03/02/2022   Procedure: STONE EXTRACTION WITH BASKET;  Surgeon: Malen Gauze, MD;  Location: [facility];  Service: Urology;  Laterality: Bilateral;   STONE EXTRACTION WITH BASKET Bilateral 03/23/2022   Procedure: STONE EXTRACTION WITH BASKET;  Surgeon: Malen Gauze, MD;  Location: [facility];  Service: Urology;  Laterality: Bilateral;   TUBAL LIGATION      Social History   Socioeconomic History   Marital status: Single    Spouse  name: Not on file   Number of children: Not on file   Years of education: Not on file   Highest education level: Not on file  Occupational History   Not on file  Tobacco Use   Smoking status: Former    Packs/day: 0.50    Years: 18.00    Additional pack years: 0.00    Total pack years: 9.00     Types: Cigarettes    Quit date: 01/21/2021    Years since quitting: 2.2   Smokeless tobacco: Never  Vaping Use   Vaping Use: Never used  Substance and Sexual Activity   Alcohol use: No   Drug use: No   Sexual activity: Yes    Birth control/protection: Surgical  Other Topics Concern   Not on file  Social History Narrative   Not on file   Social Determinants of Health   Financial Resource Strain: Not on file  Food Insecurity: Not on file  Transportation Needs: Not on file  Physical Activity: Not on file  Stress: Not on file  Social Connections: Not on file    Family History  Problem Relation Age of Onset   COPD Mother    Cancer Father    COPD Father    Heart attack Father     Outpatient Encounter Medications as of 04/13/2023  Medication Sig   allopurinol (ZYLOPRIM) 300 MG tablet Take 1 tablet (300 mg total) by mouth daily.   indapamide (LOZOL) 2.5 MG tablet Take 1 tablet (2.5 mg total) by mouth daily.   Multiple Vitamin (MULTIVITAMIN WITH MINERALS) TABS tablet Take 1 tablet by mouth daily.   ondansetron (ZOFRAN) 4 MG tablet Take 1 tablet (4 mg total) by mouth every 8 (eight) hours as needed for nausea or vomiting.   Potassium Citrate (UROCIT-K 15) 15 MEQ (1620 MG) TBCR Take 1 tablet by mouth daily.   tirzepatide (ZEPBOUND) 2.5 MG/0.5ML Pen Inject 2.5 mg into the skin once a week.   tirzepatide (ZEPBOUND) 5 MG/0.5ML Pen Inject 5 mg into the skin once a week.   dicyclomine (BENTYL) 20 MG tablet Take 20 mg by mouth 4 (four) times daily as needed for spasms.   No facility-administered encounter medications on file as of 04/13/2023.    ALLERGIES: Allergies  Allergen Reactions   Hydrocodone Nausea And Vomiting   Morphine And Related Hives and Other (See Comments)    Welps   Vicodin [Hydrocodone-Acetaminophen] Nausea And Vomiting    VACCINATION STATUS: Immunization History  Administered Date(s) Administered   Influenza,inj,Quad PF,6+ Mos 09/11/2014, 09/05/2015,  09/01/2016, 12/30/2018     HPI  [name] is 47 y.o. female who presents today with a medical history as above. she is being seen in follow up after being seen in consultation for hyperthyroidism requested by Junie Spencer, FNP.  she has been dealing with symptoms of progressive weight gain, fatigue, kidney stones (multiple), and constipation, depression, heat intolerance, palpitations and tremors for about a year. These symptoms are progressively worsening and troubling to her.    She does note intermittent hoarseness.  she denies dysphagia, choking, shortness of breath.   she does have family history of thyroid dysfunction in her mothers side including some of her sisters and nieces, but denies family hx of thyroid cancer. she denies personal history of goiter. she is not on any anti-thyroid medications nor on any thyroid hormone supplements. Denies use of Biotin containing supplements other than a womens formulated MVI.  she is willing to proceed with  appropriate work up and therapy for thyrotoxicosis.  Review of systems  Constitutional: + increasing body weight-despite best efforts to eat right and exercise more,  current Body mass index is 49.55 kg/m. , + fatigue, no subjective hyperthermia, no subjective hypothermia Eyes: no blurry vision, no xerophthalmia ENT: no sore throat, no nodules palpated in throat, no dysphagia/odynophagia, no hoarseness Cardiovascular: no chest pain, no shortness of breath, no palpitations, no leg swelling Respiratory: no cough, no shortness of breath Gastrointestinal: no nausea/vomiting/diarrhea Musculoskeletal: no muscle/joint aches Skin: no rashes, no hyperemia Neurological: no tremors, no numbness, no tingling, no dizziness Psychiatric: + depression-regarding inability to lose weight, no anxiety   Objective:    BP 139/68 (BP Location: Left Arm, Patient Position: Sitting, Cuff Size: Large)   Pulse 82   Ht 5\' 6"  (1.676 m)   Wt (!) 307 lb  (139.3 kg)   LMP 11/05/2015   BMI 49.55 kg/m   Wt Readings from Last 3 Encounters:  04/13/23 (!) 307 lb (139.3 kg)  01/13/23 (!) 303 lb 9.6 oz (137.7 kg)  11/02/22 298 lb 15.1 oz (135.6 kg)     BP Readings from Last 3 Encounters:  04/13/23 139/68  01/20/23 125/79  01/13/23 135/79     Physical Exam- Limited  Constitutional:  Body mass index is 49.55 kg/m. , not in acute distress, normal state of mind Eyes:  EOMI, no exophthalmos Musculoskeletal: no gross deformities, strength intact in all four extremities, no gross restriction of joint movements Skin:  no rashes, no hyperemia Neurological: no tremor with outstretched hands   CMP     Component Value Date/Time   NA 138 10/28/2022 1318   NA 139 06/03/2022 0925   K 3.3 (L) 10/28/2022 1318   CL 106 10/28/2022 1318   CO2 24 10/28/2022 1318   GLUCOSE 109 (H) 10/28/2022 1318   BUN 12 10/28/2022 1318   BUN 10 06/03/2022 0925   CREATININE 0.78 10/28/2022 1318   CALCIUM 8.4 (L) 10/28/2022 1318   PROT 7.2 03/07/2022 0604   PROT 7.3 02/06/2022 1043   ALBUMIN 3.3 (L) 03/07/2022 0604   ALBUMIN 3.9 02/06/2022 1043   AST 18 03/07/2022 0604   ALT 27 03/07/2022 0604   ALKPHOS 72 03/07/2022 0604   BILITOT 0.8 03/07/2022 0604   BILITOT 0.2 02/06/2022 1043   GFRNONAA >60 10/28/2022 1318   GFRAA >60 10/12/2019 0745     CBC    Component Value Date/Time   WBC 10.4 03/20/2022 1100   RBC 4.33 03/20/2022 1100   HGB 13.0 03/20/2022 1100   HGB 13.9 02/06/2022 1043   HCT 40.2 03/20/2022 1100   HCT 41.6 02/06/2022 1043   PLT 300 03/20/2022 1100   PLT 304 02/06/2022 1043   MCV 92.8 03/20/2022 1100   MCV 91 02/06/2022 1043   MCH 30.0 03/20/2022 1100   MCHC 32.3 03/20/2022 1100   RDW 12.9 03/20/2022 1100   RDW 12.6 02/06/2022 1043   LYMPHSABS 3.0 03/20/2022 1100   LYMPHSABS 2.6 02/06/2022 1043   MONOABS 0.7 03/20/2022 1100   EOSABS 0.1 03/20/2022 1100   EOSABS 0.1 02/06/2022 1043   BASOSABS 0.0 03/20/2022 1100   BASOSABS 0.1  02/06/2022 1043     Diabetic Labs (most recent): Lab Results  Component Value Date   HGBA1C 5.9 (A) 04/13/2023   HGBA1C 5.8 (A) 01/13/2023   HGBA1C 5.6 09/02/2022    Lipid Panel     Component Value Date/Time   CHOL 160 02/06/2022 1043   TRIG 169 (H) 02/06/2022  1043   HDL 40 02/06/2022 1043   CHOLHDL 4.0 02/06/2022 1043   LDLCALC 91 02/06/2022 1043   LABVLDL 29 02/06/2022 1043     Lab Results  Component Value Date   TSH 1.120 01/07/2023   TSH 1.310 08/12/2022   TSH 0.723 04/02/2022   TSH 0.382 (L) 02/06/2022   FREET4 1.12 01/07/2023   FREET4 1.54 08/12/2022   FREET4 1.23 04/02/2022   FREET4 CANCELED 02/06/2022     Latest Reference Range & Units 02/06/22 10:43 04/02/22 08:25 08/12/22 08:28 01/07/23 10:12  TSH 0.450 - 4.500 uIU/mL 0.382 (L) 0.723 1.310 1.120  Triiodothyronine,Free,Serum 2.0 - 4.4 pg/mL  3.4    T4,Free(Direct) 0.82 - 1.77 ng/dL CANCELED 5.40 9.81 1.91  Thyroperoxidase Ab SerPl-aCnc 0 - 34 IU/mL  16    Thyroglobulin Antibody 0.0 - 0.9 IU/mL  3.1 (H)    (L): Data is abnormally low (H): Data is abnormally high    Assessment & Plan:   1. Thyroid antibody positive  Her thyroid function tests show positive antibodies indicating autoimmune thyroid dysfunction.  She is genetically predisposed to developing a thyroid condition in the future.  She will need routine follow up to determine if/when additional thyroid testing and intervention will be needed.  Will recheck TFTs prior to next visit.  2. Prediabetes  Her A1c today is 5.9%, increasing slightly from last visit of 5.8%.  She has heard about Mounjaro and is wanting to try that product.  However, insurance will not cover it without a diagnosis of type 2 diabetes.  Instead, I can try sending in Zepbound which is the same molecule intended to help with weight loss.  I discussed and initiated Zepbound 2.5 mg SQ weekly x 1 month, then increase to 5 mg SQ weekly thereafter.  The following Lifestyle  Medicine recommendations according to American College of Lifestyle Medicine [facility]) were discussed and offered to patient and she agrees to start the journey:  A. Whole Foods, Plant-based plate comprising of fruits and vegetables, plant-based proteins, whole-grain carbohydrates was discussed in detail with the patient.   A list for source of those nutrients were also provided to the patient.  Patient will use only water or unsweetened tea for hydration. B.  The need to stay away from risky substances including alcohol, smoking; obtaining 7 to 9 hours of restorative sleep, at least 150 minutes of moderate intensity exercise weekly, the importance of healthy social connections,  and stress reduction techniques were discussed. C.  A full color page of  Calorie density of various food groups per pound showing examples of each food groups was provided to the patient.  - Nutritional counseling repeated at each appointment due to patients tendency to fall back in to old habits.  - The patient admits there is a room for improvement in their diet and drink choices. -  Suggestion is made for the patient to avoid simple carbohydrates from their diet including Cakes, Sweet Desserts / Pastries, Ice Cream, Soda (diet and regular), Sweet Tea, Candies, Chips, Cookies, Sweet Pastries, Store Bought Juices, Alcohol in Excess of 1-2 drinks a day, Artificial Sweeteners, Coffee Creamer, and "Sugar-free" Products. This will help patient to have stable blood glucose profile and potentially avoid unintended weight gain.   - I encouraged the patient to switch to unprocessed or minimally processed complex starch and increased protein intake (animal or plant source), fruits, and vegetables.   - Patient is advised to stick to a routine mealtimes to eat 3 meals a day  and avoid unnecessary snacks (to snack only to correct hypoglycemia).    -Patient is advised to maintain close follow up with Junie Spencer, FNP for primary care  needs.  I did check PTH previously, given her history of kidney stones to rule out parathyroid involvement, labs were normal.  I also checked am cortisol level previously to assess adrenal function which was normal as well.     I spent  41  minutes in the care of the patient today including review of labs from CMP, Lipids, Thyroid Function, Hematology (current and previous including abstractions from other facilities); face-to-face time discussing  her blood glucose readings/logs, discussing hypoglycemia and hyperglycemia episodes and symptoms, medications doses, her options of short and long term treatment based on the latest standards of care / guidelines;  discussion about incorporating lifestyle medicine;  and documenting the encounter. Risk reduction counseling performed per USPSTF guidelines to reduce obesity and cardiovascular risk factors.     Please refer to Patient Instructions for Blood Glucose Monitoring and Insulin/Medications Dosing Guide"  in media tab for additional information. Please  also refer to " Patient Self Inventory" in the Media  tab for reviewed elements of pertinent patient history.  [name] participated in the discussions, expressed understanding, and voiced agreement with the above plans.  All questions were answered to her satisfaction. she is encouraged to contact clinic should she have any questions or concerns prior to her return visit.  Follow up plan: Return in about 3 months (around 07/14/2023) for Thyroid follow up, Previsit labs.   Thank you for involving me in the care of this pleasant patient, and I will continue to update you with her progress.   Ronny Bacon, [facility] [address] [place], [place] [address] Phone: [phone] Fax: [phone]  04/13/2023, 1:03 PM

## 2023-04-21 ENCOUNTER — Ambulatory Visit: Payer: 59 | Admitting: Urology

## 2023-04-21 ENCOUNTER — Telehealth: Payer: Self-pay | Admitting: *Deleted

## 2023-04-21 NOTE — Telephone Encounter (Signed)
Patient states that she has called her pharmacy and other pharmacies in [facility] and no one has it. She is asking  for the 2.5 mg. She also is wanting to know if her insurance covered it. I do not see anything on it. Will follow up with the PA team to see if they may have seen something.

## 2023-04-22 ENCOUNTER — Other Ambulatory Visit (HOSPITAL_COMMUNITY): Payer: Self-pay

## 2023-04-22 ENCOUNTER — Telehealth: Payer: Self-pay

## 2023-04-22 NOTE — Telephone Encounter (Signed)
We haven't received anything for her.  Per test claim: This is a plan exclusion.  I will try to start a PA and document in separate encounter

## 2023-04-22 NOTE — Telephone Encounter (Signed)
I haven't seen a PA for this pt.

## 2023-04-22 NOTE — Telephone Encounter (Signed)
Patient Advocate Encounter   Received notification from pt msgs that prior authorization is required for Zepbound  Submitted: 04/22/23 Key [id number]

## 2023-04-22 NOTE — Telephone Encounter (Signed)
Pt is calling back to check the status.

## 2023-04-22 NOTE — Telephone Encounter (Signed)
Patient was called and made aware. 

## 2023-04-28 NOTE — Telephone Encounter (Signed)
PA has been DENIED.   Denial letter has been attached to patients media.  

## 2023-05-10 ENCOUNTER — Telehealth: Payer: Self-pay | Admitting: *Deleted

## 2023-05-10 NOTE — Telephone Encounter (Signed)
Patient called because it had been 3 weeks and she had not heard from the PA for her Zepbound. I looked and it was a note attached in the media file on her chart, that this was denied. It is not on the patient's drug formulary. Advised in a message that was left for the patient, for her to call and check with the insurqanace on which medication they will cover.

## 2023-05-13 ENCOUNTER — Telehealth: Payer: Self-pay | Admitting: Nurse Practitioner

## 2023-05-13 NOTE — Telephone Encounter (Signed)
Pt states that ins will not cover the Zepbound, states Ins said that provider could do an appeal or send in for something different.   Pt knows Alphonzo Lemmings will not be back in office until 05/17/23 and states that its fine to return the message next week.

## 2023-05-17 NOTE — Telephone Encounter (Signed)
Left pt voicemail and sent my chart message relaying what Alphonzo Lemmings stated.

## 2023-05-17 NOTE — Telephone Encounter (Signed)
Insurance denied it because they do not cover weight loss medications (like [place], [place], Saxenda).  It is her employer that is driving that.  I usually have patients reach out to their HR department and express their concerns with coverage, if enough people complain, maybe they will be more willing to add it back in the future.

## 2023-05-31 ENCOUNTER — Telehealth: Payer: 59 | Admitting: Physician Assistant

## 2023-05-31 DIAGNOSIS — J441 Chronic obstructive pulmonary disease with (acute) exacerbation: Secondary | ICD-10-CM | POA: Diagnosis not present

## 2023-05-31 DIAGNOSIS — J45901 Unspecified asthma with (acute) exacerbation: Secondary | ICD-10-CM | POA: Diagnosis not present

## 2023-05-31 MED ORDER — AZITHROMYCIN 250 MG PO TABS: ORAL_TABLET | ORAL | 0 refills | Status: AC

## 2023-05-31 MED ORDER — BENZONATATE 100 MG PO CAPS: 100.0000 mg | ORAL_CAPSULE | Freq: Three times a day (TID) | ORAL | 0 refills | Status: DC | PRN

## 2023-05-31 MED ORDER — PREDNISONE 10 MG (21) PO TBPK: ORAL_TABLET | ORAL | 0 refills | Status: DC

## 2023-05-31 NOTE — Progress Notes (Signed)
E-Visit for Cough  We are sorry that you are not feeling well.  Here is how we plan to help!  Based on your presentation I believe you most likely have A cough due to bacteria.  When patients have a fever and a productive cough with a change in color or increased sputum production, we are concerned about bacterial bronchitis.  If left untreated it can progress to pneumonia.  If your symptoms do not improve with your treatment plan it is important that you contact your provider.   I have prescribed Azithromyin 250 mg: two tablets now and then one tablet daily for 4 additonal days    In addition you may use A non-prescription cough medication called Mucinex DM: take 2 tablets every 12 hours. and A prescription cough medication called Tessalon Perles 100mg . You may take 1-2 capsules every 8 hours as needed for your cough.  Prednisone 10 mg daily for 6 days (see taper instructions below)  Directions for 6 day taper: Day 1: 2 tablets before breakfast, 1 after both lunch & dinner and 2 at bedtime Day 2: 1 tab before breakfast, 1 after both lunch & dinner and 2 at bedtime Day 3: 1 tab at each meal & 1 at bedtime Day 4: 1 tab at breakfast, 1 at lunch, 1 at bedtime Day 5: 1 tab at breakfast & 1 tab at bedtime Day 6: 1 tab at breakfast  From your responses in the eVisit questionnaire you describe inflammation in the upper respiratory tract which is causing a significant cough.  This is commonly called Bronchitis and has four common causes:   Allergies Viral Infections Acid Reflux Bacterial Infection Allergies, viruses and acid reflux are treated by controlling symptoms or eliminating the cause. An example might be a cough caused by taking certain blood pressure medications. You stop the cough by changing the medication. Another example might be a cough caused by acid reflux. Controlling the reflux helps control the cough.  USE OF BRONCHODILATOR ("RESCUE") INHALERS: There is a risk from using your  bronchodilator too frequently.  The risk is that over-reliance on a medication which only relaxes the muscles surrounding the breathing tubes can reduce the effectiveness of medications prescribed to reduce swelling and congestion of the tubes themselves.  Although you feel brief relief from the bronchodilator inhaler, your asthma may actually be worsening with the tubes becoming more swollen and filled with mucus.  This can delay other crucial treatments, such as oral steroid medications. If you need to use a bronchodilator inhaler daily, several times per day, you should discuss this with your provider.  There are probably better treatments that could be used to keep your asthma under control.     HOME CARE Only take medications as instructed by your medical team. Complete the entire course of an antibiotic. Drink plenty of fluids and get plenty of rest. Avoid close contacts especially the very young and the elderly Cover your mouth if you cough or cough into your sleeve. Always remember to wash your hands A steam or ultrasonic humidifier can help congestion.   GET HELP RIGHT AWAY IF: You develop worsening fever. You become short of breath You cough up blood. Your symptoms persist after you have completed your treatment plan MAKE SURE YOU  Understand these instructions. Will watch your condition. Will get help right away if you are not doing well or get worse.    Thank you for choosing an e-visit.  Your e-visit answers were reviewed by a  board certified advanced clinical practitioner to complete your personal care plan. Depending upon the condition, your plan could have included both over the counter or prescription medications.  Please review your pharmacy choice. Make sure the pharmacy is open so you can pick up prescription now. If there is a problem, you may contact your provider through [employer] and have the prescription routed to another pharmacy.  Your safety is important  to [place]. If you have drug allergies check your prescription carefully.   For the next 24 hours you can use MyChart to ask questions about today's visit, request a non-urgent call back, or ask for a work or school excuse. You will get an email in the next two days asking about your experience. I hope that your e-visit has been valuable and will speed your recovery.  I have spent 5 minutes in review of e-visit questionnaire, review and updating patient chart, medical decision making and response to patient.   Margaretann Loveless, PA-C

## 2023-06-02 ENCOUNTER — Ambulatory Visit (HOSPITAL_COMMUNITY): Payer: 59

## 2023-06-11 ENCOUNTER — Ambulatory Visit: Payer: 59 | Admitting: Urology

## 2023-06-11 ENCOUNTER — Ambulatory Visit (HOSPITAL_COMMUNITY)
Admission: RE | Admit: 2023-06-11 | Discharge: 2023-06-11 | Disposition: A | Discharge status: Home / Self Care | Payer: 59 | Source: Ambulatory Visit | Attending: Urology | Admitting: Urology

## 2023-06-11 ENCOUNTER — Ambulatory Visit (HOSPITAL_COMMUNITY): Payer: 59

## 2023-06-11 DIAGNOSIS — N200 Calculus of kidney: Secondary | ICD-10-CM | POA: Insufficient documentation

## 2023-06-18 ENCOUNTER — Ambulatory Visit (INDEPENDENT_AMBULATORY_CARE_PROVIDER_SITE_OTHER): Payer: 59 | Admitting: Urology

## 2023-06-18 VITALS — BP 145/82 | HR 77

## 2023-06-18 DIAGNOSIS — N200 Calculus of kidney: Secondary | ICD-10-CM

## 2023-06-18 LAB — URINALYSIS, ROUTINE W REFLEX MICROSCOPIC
Bilirubin, UA: NEGATIVE
Glucose, UA: NEGATIVE
Ketones, UA: NEGATIVE
Leukocytes,UA: NEGATIVE
Nitrite, UA: NEGATIVE
Protein,UA: NEGATIVE
RBC, UA: NEGATIVE
Specific Gravity, UA: 1.03 (ref 1.005–1.030)
Urobilinogen, Ur: 0.2 mg/dL (ref 0.2–1.0)
pH, UA: 5.5 (ref 5.0–7.5)

## 2023-06-18 MED ORDER — POTASSIUM CITRATE ER 15 MEQ (1620 MG) PO TBCR: 1.0000 | EXTENDED_RELEASE_TABLET | Freq: Every day | ORAL | 11 refills | Status: DC

## 2023-06-18 MED ORDER — SODIUM BICARBONATE 650 MG PO TABS: 650.0000 mg | ORAL_TABLET | Freq: Two times a day (BID) | ORAL | 11 refills | Status: DC

## 2023-06-18 MED ORDER — ALLOPURINOL 300 MG PO TABS: 300.0000 mg | ORAL_TABLET | Freq: Every day | ORAL | 11 refills | Status: DC

## 2023-06-24 ENCOUNTER — Encounter: Payer: Self-pay | Admitting: Urology

## 2023-06-24 NOTE — Patient Instructions (Signed)

## 2023-06-24 NOTE — Progress Notes (Signed)
06/18/2023 2:13 PM   [name] Mar 30, 1976 [id number]  Referring provider: Junie Spencer, FNP [address] [place],  [place] [address]  Followup nephrolithiasis  HPI: [name] is a 47yo here for followup for nephrolithiasis. No stone events since last visit. Renal US from 7/12 shows right renal calculi. She denies nay flank pain. She is on allopurinol and urocitK. Urine pH 5.5. no other complaints today   PMH: Past Medical History:  Diagnosis Date   Arthritis    Asthma    COPD (chronic obstructive pulmonary disease) (HCC)    sees PCP   Depression    Diabetes mellitus without complication (HCC)    GERD (gastroesophageal reflux disease)    History of kidney stones    Hypertension    pt. denies  never taken meds   Pneumonia    PONV (postoperative nausea and vomiting)     Surgical History: Past Surgical History:  Procedure Laterality Date   ABDOMINAL HYSTERECTOMY     CHOLECYSTECTOMY     CYSTOSCOPY W/ RETROGRADES  08/04/2011   Procedure: CYSTOSCOPY WITH RETROGRADE PYELOGRAM;  Surgeon: Ky Barban;  Location: [facility];  Service: Urology;  Laterality: Right;  Right Retrograde   CYSTOSCOPY WITH RETROGRADE PYELOGRAM, URETEROSCOPY AND STENT PLACEMENT Left 07/24/2021   Procedure: CYSTOSCOPY WITH RETROGRADE PYELOGRAM, URETEROSCOPY AND STENT PLACEMENT;  Surgeon: Malen Gauze, MD;  Location: [facility];  Service: Urology;  Laterality: Left;   CYSTOSCOPY WITH RETROGRADE PYELOGRAM, URETEROSCOPY AND STENT PLACEMENT Bilateral 03/02/2022   Procedure: CYSTOSCOPY WITH RETROGRADE PYELOGRAM, RIGHT URETEROSCOPY AND BILATERAL STENT PLACEMENT;  Surgeon: Malen Gauze, MD;  Location: [facility];  Service: Urology;  Laterality: Bilateral;   CYSTOSCOPY WITH RETROGRADE PYELOGRAM, URETEROSCOPY AND STENT PLACEMENT Bilateral 03/23/2022   Procedure: CYSTOSCOPY WITH RETROGRADE PYELOGRAM, URETEROSCOPY AND STENT REMOVAL;  Surgeon: Malen Gauze, MD;  Location: [facility];  Service:  Urology;  Laterality: Bilateral;   CYSTOSCOPY WITH RETROGRADE PYELOGRAM, URETEROSCOPY AND STENT PLACEMENT Left 11/02/2022   Procedure: CYSTOSCOPY WITH RETROGRADE PYELOGRAM, URETEROSCOPY AND STENT PLACEMENT;  Surgeon: Malen Gauze, MD;  Location: [facility];  Service: Urology;  Laterality: Left;   HOLMIUM LASER APPLICATION N/A 04/15/2018   Procedure: HOLMIUM LASER APPLICATION;  Surgeon: Crist Fat, MD;  Location: [facility];  Service: Urology;  Laterality: N/A;   HOLMIUM LASER APPLICATION Left 07/24/2021   Procedure: HOLMIUM LASER APPLICATION;  Surgeon: Malen Gauze, MD;  [facility]: [facility];  Service: Urology;  Laterality: Left;   HOLMIUM LASER APPLICATION Bilateral 03/02/2022   Procedure: HOLMIUM LASER APPLICATION;  Surgeon: Malen Gauze, MD;  [facility];  Service: Urology;  Laterality: Bilateral;   HOLMIUM LASER APPLICATION Left 11/02/2022   Procedure: HOLMIUM LASER APPLICATION;  Surgeon: Malen Gauze, MD;  [facility]: [facility];  Service: Urology;  Laterality: Left;   LITHOTRIPSY     NEPHROLITHOTOMY Right 04/15/2018   Procedure: RIGHT NEPHROLITHOTOMY PERCUTANEOUS WITH ACCESS;  Surgeon: Crist Fat, MD;  Location: [facility];  Service: Urology;  Laterality: Right;   NEPHROLITHOTOMY Right 12/29/2018   Procedure: NEPHROLITHOTOMY PERCUTANEOUS;  Surgeon: Crist Fat, MD;  [facility];  Service: Urology;  Laterality: Right;   PERCUTANEOUS NEPHROLITHOTRIPSY  2012   both sides kidney    STONE EXTRACTION WITH BASKET Bilateral 03/02/2022   Procedure: STONE EXTRACTION WITH BASKET;  Surgeon: Malen Gauze, MD;  [facility]: [facility];  Service: Urology;  Laterality: Bilateral;   STONE EXTRACTION WITH BASKET Bilateral 03/23/2022   Procedure: STONE EXTRACTION WITH  BASKET;  Surgeon: Malen Gauze, MD;  [facility]: [facility];  Service: Urology;  Laterality: Bilateral;   TUBAL LIGATION      Home Medications:  Allergies as of 06/18/2023       Reactions   Hydrocodone  Nausea And Vomiting   Morphine And Codeine Hives, Other (See Comments)   Welps   Vicodin [hydrocodone-acetaminophen] Nausea And Vomiting        Medication List        Accurate as of June 18, 2023 11:59 PM. If you have any questions, ask your nurse or doctor.          allopurinol 300 MG tablet Commonly known as: ZYLOPRIM Take 1 tablet (300 mg total) by mouth daily.   benzonatate 100 MG capsule Commonly known as: TESSALON Take 1 capsule (100 mg total) by mouth 3 (three) times daily as needed.   dicyclomine 20 MG tablet Commonly known as: BENTYL Take 20 mg by mouth 4 (four) times daily as needed for spasms.   indapamide 2.5 MG tablet Commonly known as: LOZOL Take 1 tablet (2.5 mg total) by mouth daily.   multivitamin with minerals Tabs tablet Take 1 tablet by mouth daily.   ondansetron 4 MG tablet Commonly known as: Zofran Take 1 tablet (4 mg total) by mouth every 8 (eight) hours as needed for nausea or vomiting.   Potassium Citrate 15 MEQ (1620 MG) Tbcr Commonly known as: Urocit-K 15 Take 1 tablet by mouth daily.   predniSONE 10 MG (21) Tbpk tablet Commonly known as: STERAPRED UNI-PAK 21 TAB 6 day taper; take as directed on package instructions   sodium bicarbonate 650 MG tablet Take 1 tablet (650 mg total) by mouth 2 (two) times daily.   Zepbound 2.5 MG/0.5ML Pen Generic drug: tirzepatide Inject 2.5 mg into the skin once a week.   Zepbound 5 MG/0.5ML Pen Generic drug: tirzepatide Inject 5 mg into the skin once a week.        Allergies:  Allergies  Allergen Reactions   Hydrocodone Nausea And Vomiting   Morphine And Codeine Hives and Other (See Comments)    Welps   Vicodin [Hydrocodone-Acetaminophen] Nausea And Vomiting    Family History: Family History  Problem Relation Age of Onset   COPD Mother    Cancer Father    COPD Father    Heart attack Father     Social History:  reports that she quit smoking about 2 years ago. Her smoking use  included cigarettes. She started smoking about 20 years ago. She has a 9 pack-year smoking history. She has never used smokeless tobacco. She reports that she does not drink alcohol and does not use drugs.  ROS: All other review of systems were reviewed and are negative except what is noted above in HPI  Physical Exam: BP (!) 145/82   Pulse 77   LMP 11/05/2015   Constitutional:  Alert and oriented, No acute distress. HEENT:  AT, moist mucus membranes.  Trachea midline, no masses. Cardiovascular: No clubbing, cyanosis, or edema. Respiratory: Normal respiratory effort, no increased work of breathing. GI: Abdomen is soft, nontender, nondistended, no abdominal masses GU: No CVA tenderness.  Lymph: No cervical or inguinal lymphadenopathy. Skin: No rashes, bruises or suspicious lesions. Neurologic: Grossly intact, no focal deficits, moving all 4 extremities. Psychiatric: Normal mood and affect.  Laboratory Data: Lab Results  Component Value Date   WBC 10.4 03/20/2022   HGB 13.0 03/20/2022   HCT 40.2 03/20/2022   MCV 92.8 03/20/2022  PLT 300 03/20/2022    Lab Results  Component Value Date   CREATININE 0.78 10/28/2022    No results found for: "PSA"  No results found for: "TESTOSTERONE"  Lab Results  Component Value Date   HGBA1C 5.9 (A) 04/13/2023    Urinalysis    Component Value Date/Time   COLORURINE AMBER (A) 03/20/2022 0932   APPEARANCEUR Clear 06/18/2023 1254   LABSPEC 1.015 03/20/2022 0932   PHURINE 8.0 03/20/2022 0932   GLUCOSEU Negative 06/18/2023 1254   HGBUR LARGE (A) 03/20/2022 0932   BILIRUBINUR Negative 06/18/2023 1254   KETONESUR NEGATIVE 03/20/2022 0932   PROTEINUR Negative 06/18/2023 1254   PROTEINUR 100 (A) 03/20/2022 0932   UROBILINOGEN 0.2 02/12/2015 1932   NITRITE Negative 06/18/2023 1254   NITRITE NEGATIVE 03/20/2022 0932   LEUKOCYTESUR Negative 06/18/2023 1254   LEUKOCYTESUR MODERATE (A) 03/20/2022 0932    Lab Results  Component Value  Date   LABMICR Comment 06/18/2023   WBCUA 0-5 01/20/2023   LABEPIT 0-10 01/20/2023   MUCUS Present 02/10/2022   BACTERIA None seen 01/20/2023    Pertinent Imaging: Renal US 06/11/2023: Images reviewed and discussed with the patient  Results for orders placed during the hospital encounter of 07/02/21  DG Abd 1 View  Narrative CLINICAL DATA:  Left-sided flank pain.  EXAM: ABDOMEN - 1 VIEW  COMPARISON:  Ultrasound renal 07/02/2021, CT renal 10/10/2019  FINDINGS: Right upper quadrant surgical clips. The bowel gas pattern is normal. No radio-opaque calculi or other significant radiographic abnormality are seen.  IMPRESSION: Negative radiograph.  These results and ultrasound renal results from same day will be called to the ordering clinician or representative by the Radiologist Assistant, and communication documented in the PACS or [employer].   Electronically Signed By: Tish Frederickson M.D. On: 07/03/2021 21:41  No results found for this or any previous visit.  No results found for this or any previous visit.  No results found for this or any previous visit.  Results for orders placed during the hospital encounter of 06/11/23  Ultrasound renal complete  Narrative CLINICAL DATA:  Nephrolithiasis.  EXAM: RENAL / URINARY TRACT ULTRASOUND COMPLETE  COMPARISON:  January 11, 2023  FINDINGS: Right Kidney:  Renal measurements: 12.7 x 4.8 x 5.8 cm = volume: 184.3 mL. Multiple right renal calculi, largest measures 6.1 mm. Echogenicity otherwise within normal limits. No mass or hydronephrosis visualized.  Left Kidney:  Renal measurements: 11.5 x 5.3 x 6.2 cm = volume: 198.8 mL. Echogenicity within normal limits. No mass or hydronephrosis visualized.  Bladder:  Appears normal for degree of bladder distention. Bilateral ureteral jets noted.  Other:  None.  IMPRESSION: Multiple right renal calculi, largest measures 6.1 mm.  No hydronephrosis.   Electronically Signed By: Sherian Rein M.D. On: 06/11/2023 13:12  No valid procedures specified. No results found for this or any previous visit.  Results for orders placed in visit on 09/08/22  CT RENAL STONE STUDY  Narrative CLINICAL DATA:  Left flank pain.  Nephrolithiasis.  EXAM: CT ABDOMEN AND PELVIS WITHOUT CONTRAST  TECHNIQUE: Multidetector CT imaging of the abdomen and pelvis was performed following the standard protocol without IV contrast.  RADIATION DOSE REDUCTION: This exam was performed according to the departmental dose-optimization program which includes automated exposure control, adjustment of the mA and/or kV according to patient size and/or use of iterative reconstruction technique.  COMPARISON:  03/20/2022  FINDINGS: Lower chest: No acute findings.  Hepatobiliary: No mass visualized on this unenhanced exam. Moderate diffuse hepatic  steatosis again noted. Prior cholecystectomy. No evidence of biliary obstruction.  Pancreas: No mass or inflammatory process visualized on this unenhanced exam.  Spleen:  Within normal limits in size.  Adrenals/Urinary tract: Bilateral ureteral stents have been removed since prior study. A few tiny less than 5 mm renal calculi are seen bilaterally. Moderate left hydronephrosis is seen due to a 7 mm calculus at the left UPJ.  Stomach/Bowel: Stable small hiatal hernia. No evidence of obstruction, inflammatory process, or abnormal fluid collections. Normal appendix visualized. Mild diverticulosis is seen involving the sigmoid colon, however there is no evidence of diverticulitis.  Vascular/Lymphatic: No pathologically enlarged lymph nodes identified. No evidence of abdominal aortic aneurysm.  Reproductive: Prior hysterectomy noted. Adnexal regions are unremarkable in appearance.  Other:  None.  Musculoskeletal:  No suspicious bone lesions identified.  IMPRESSION: Moderate left  hydronephrosis due to 7 mm calculus at the left UPJ.  Bilateral nephrolithiasis.  Stable small hiatal hernia.  Moderate hepatic steatosis.  Mild sigmoid diverticulosis, without radiographic evidence of diverticulitis.   Electronically Signed By: Danae Orleans M.D. On: 09/17/2022 12:32   Assessment & Plan:    1. Kidney stones -add sodium bicarbonate 650mg  BID -followup 3 months with renal US - Urinalysis, Routine w reflex microscopic - US RENAL; Future - Potassium Citrate (UROCIT-K 15) 15 MEQ (1620 MG) TBCR; Take 1 tablet by mouth daily.  Dispense: 30 tablet; Refill: 11 - allopurinol (ZYLOPRIM) 300 MG tablet; Take 1 tablet (300 mg total) by mouth daily.  Dispense: 30 tablet; Refill: 11   No follow-ups on file.  [name], MD  [facility] [place]

## 2023-07-14 ENCOUNTER — Ambulatory Visit: Payer: 59 | Admitting: Nurse Practitioner

## 2023-08-30 ENCOUNTER — Ambulatory Visit: Payer: 59 | Admitting: Nurse Practitioner

## 2023-09-17 ENCOUNTER — Ambulatory Visit (HOSPITAL_COMMUNITY): Admission: RE | Admit: 2023-09-17 | Payer: 59 | Source: Ambulatory Visit

## 2023-09-21 ENCOUNTER — Ambulatory Visit (HOSPITAL_COMMUNITY)
Admission: RE | Admit: 2023-09-21 | Discharge: 2023-09-21 | Disposition: A | Discharge status: Home / Self Care | Payer: 59 | Source: Ambulatory Visit | Attending: Urology | Admitting: Urology

## 2023-09-21 DIAGNOSIS — N200 Calculus of kidney: Secondary | ICD-10-CM | POA: Insufficient documentation

## 2023-09-22 ENCOUNTER — Encounter: Payer: Self-pay | Admitting: Urology

## 2023-09-22 ENCOUNTER — Ambulatory Visit (INDEPENDENT_AMBULATORY_CARE_PROVIDER_SITE_OTHER): Payer: 59 | Admitting: Urology

## 2023-09-22 VITALS — BP 141/82 | HR 81

## 2023-09-22 DIAGNOSIS — N200 Calculus of kidney: Secondary | ICD-10-CM

## 2023-09-22 LAB — URINALYSIS, ROUTINE W REFLEX MICROSCOPIC
Bilirubin, UA: NEGATIVE
Glucose, UA: NEGATIVE
Ketones, UA: NEGATIVE
Leukocytes,UA: NEGATIVE
Nitrite, UA: NEGATIVE
Protein,UA: NEGATIVE
RBC, UA: NEGATIVE
Specific Gravity, UA: 1.02 (ref 1.005–1.030)
Urobilinogen, Ur: 0.2 mg/dL (ref 0.2–1.0)
pH, UA: 7 (ref 5.0–7.5)

## 2023-09-22 MED ORDER — ALLOPURINOL 300 MG PO TABS: 300.0000 mg | ORAL_TABLET | Freq: Every day | ORAL | 11 refills | Status: DC

## 2023-09-22 MED ORDER — POTASSIUM CITRATE ER 15 MEQ (1620 MG) PO TBCR: 1.0000 | EXTENDED_RELEASE_TABLET | Freq: Every day | ORAL | 11 refills | Status: DC

## 2023-09-22 NOTE — Patient Instructions (Signed)

## 2023-09-22 NOTE — Progress Notes (Signed)
09/22/2023 10:45 AM   Ruthell Rummage Halderman [birth year] [id number]  Referring provider: Junie Spencer, FNP [address] [place],  [place] [address]  Followup nephrolithiasis   HPI: Ms Scolaro is a 47yo here for followup for nephrolithiasis. No stone events since last visit. She denies any flank pain. She decreased her red meat intake.  She is on potassium citrate daily and allopurinol. She is exercising more often. Renal US shows right lower pole calculus. Urine pH 7.0   PMH: Past Medical History:  Diagnosis Date   Arthritis    Asthma    COPD (chronic obstructive pulmonary disease) (HCC)    sees PCP   Depression    Diabetes mellitus without complication (HCC)    GERD (gastroesophageal reflux disease)    History of kidney stones    Hypertension    pt. denies  never taken meds   Pneumonia    PONV (postoperative nausea and vomiting)     Surgical History: Past Surgical History:  Procedure Laterality Date   ABDOMINAL HYSTERECTOMY     CHOLECYSTECTOMY     CYSTOSCOPY W/ RETROGRADES  08/04/2011   Procedure: CYSTOSCOPY WITH RETROGRADE PYELOGRAM;  Surgeon: Ky Barban;  Location: [facility];  Service: Urology;  Laterality: Right;  Right Retrograde   CYSTOSCOPY WITH RETROGRADE PYELOGRAM, URETEROSCOPY AND STENT PLACEMENT Left 07/24/2021   Procedure: CYSTOSCOPY WITH RETROGRADE PYELOGRAM, URETEROSCOPY AND STENT PLACEMENT;  Surgeon: Malen Gauze, MD;  Location: [facility];  Service: Urology;  Laterality: Left;   CYSTOSCOPY WITH RETROGRADE PYELOGRAM, URETEROSCOPY AND STENT PLACEMENT Bilateral 03/02/2022   Procedure: CYSTOSCOPY WITH RETROGRADE PYELOGRAM, RIGHT URETEROSCOPY AND BILATERAL STENT PLACEMENT;  Surgeon: Malen Gauze, MD;  Location: [facility];  Service: Urology;  Laterality: Bilateral;   CYSTOSCOPY WITH RETROGRADE PYELOGRAM, URETEROSCOPY AND STENT PLACEMENT Bilateral 03/23/2022   Procedure: CYSTOSCOPY WITH RETROGRADE PYELOGRAM, URETEROSCOPY AND STENT REMOVAL;   Surgeon: Malen Gauze, MD;  Location: [facility];  Service: Urology;  Laterality: Bilateral;   CYSTOSCOPY WITH RETROGRADE PYELOGRAM, URETEROSCOPY AND STENT PLACEMENT Left 11/02/2022   Procedure: CYSTOSCOPY WITH RETROGRADE PYELOGRAM, URETEROSCOPY AND STENT PLACEMENT;  Surgeon: Malen Gauze, MD;  Location: [facility];  Service: Urology;  Laterality: Left;   HOLMIUM LASER APPLICATION N/A 04/15/2018   Procedure: HOLMIUM LASER APPLICATION;  Surgeon: Crist Fat, MD;  Location: [facility];  Service: Urology;  Laterality: N/A;   HOLMIUM LASER APPLICATION Left 07/24/2021   Procedure: HOLMIUM LASER APPLICATION;  Surgeon: Malen Gauze, MD;  Location: [facility];  Service: Urology;  Laterality: Left;   HOLMIUM LASER APPLICATION Bilateral 03/02/2022   Procedure: HOLMIUM LASER APPLICATION;  Surgeon: Malen Gauze, MD;  Location: [facility];  Service: Urology;  Laterality: Bilateral;   HOLMIUM LASER APPLICATION Left 11/02/2022   Procedure: HOLMIUM LASER APPLICATION;  Surgeon: Malen Gauze, MD;  Location: [facility];  Service: Urology;  Laterality: Left;   LITHOTRIPSY     NEPHROLITHOTOMY Right 04/15/2018   Procedure: RIGHT NEPHROLITHOTOMY PERCUTANEOUS WITH ACCESS;  Surgeon: Crist Fat, MD;  Location: [facility];  Service: Urology;  Laterality: Right;   NEPHROLITHOTOMY Right 12/29/2018   Procedure: NEPHROLITHOTOMY PERCUTANEOUS;  Surgeon: Crist Fat, MD;  Location: [facility];  Service: Urology;  Laterality: Right;   PERCUTANEOUS NEPHROLITHOTRIPSY  2012   both sides kidney    STONE EXTRACTION WITH BASKET Bilateral 03/02/2022   Procedure: STONE EXTRACTION WITH BASKET;  Surgeon: Malen Gauze, MD;  Location: [facility];  Service: Urology;  Laterality: Bilateral;   STONE  EXTRACTION WITH BASKET Bilateral 03/23/2022   Procedure: STONE EXTRACTION WITH BASKET;  Surgeon: Malen Gauze, MD;  Location: [facility];  Service: Urology;  Laterality: Bilateral;   TUBAL LIGATION      Home Medications:   Allergies as of 09/22/2023       Reactions   Hydrocodone Nausea And Vomiting   Morphine And Codeine Hives, Other (See Comments)   Welps   Vicodin [hydrocodone-acetaminophen] Nausea And Vomiting        Medication List        Accurate as of September 22, 2023 10:45 AM. If you have any questions, ask your nurse or doctor.          allopurinol 300 MG tablet Commonly known as: ZYLOPRIM Take 1 tablet (300 mg total) by mouth daily.   benzonatate 100 MG capsule Commonly known as: TESSALON Take 1 capsule (100 mg total) by mouth 3 (three) times daily as needed.   dicyclomine 20 MG tablet Commonly known as: BENTYL Take 20 mg by mouth 4 (four) times daily as needed for spasms.   indapamide 2.5 MG tablet Commonly known as: LOZOL Take 1 tablet (2.5 mg total) by mouth daily.   multivitamin with minerals Tabs tablet Take 1 tablet by mouth daily.   ondansetron 4 MG tablet Commonly known as: Zofran Take 1 tablet (4 mg total) by mouth every 8 (eight) hours as needed for nausea or vomiting.   Potassium Citrate 15 MEQ (1620 MG) Tbcr Commonly known as: Urocit-K 15 Take 1 tablet by mouth daily.   predniSONE 10 MG (21) Tbpk tablet Commonly known as: STERAPRED UNI-PAK 21 TAB 6 day taper; take as directed on package instructions   sodium bicarbonate 650 MG tablet Take 1 tablet (650 mg total) by mouth 2 (two) times daily.   Zepbound 2.5 MG/0.5ML Pen Generic drug: tirzepatide Inject 2.5 mg into the skin once a week.   Zepbound 5 MG/0.5ML Pen Generic drug: tirzepatide Inject 5 mg into the skin once a week.        Allergies:  Allergies  Allergen Reactions   Hydrocodone Nausea And Vomiting   Morphine And Codeine Hives and Other (See Comments)    Welps   Vicodin [Hydrocodone-Acetaminophen] Nausea And Vomiting    Family History: Family History  Problem Relation Age of Onset   COPD Mother    Cancer Father    COPD Father    Heart attack Father     Social History:   reports that she quit smoking about 2 years ago. Her smoking use included cigarettes. She started smoking about 20 years ago. She has a 9 pack-year smoking history. She has never used smokeless tobacco. She reports that she does not drink alcohol and does not use drugs.  ROS: All other review of systems were reviewed and are negative except what is noted above in HPI  Physical Exam: BP (!) 141/82   Pulse 81   LMP 11/05/2015   Constitutional:  Alert and oriented, No acute distress. HEENT: [place] AT, moist mucus membranes.  Trachea midline, no masses. Cardiovascular: No clubbing, cyanosis, or edema. Respiratory: Normal respiratory effort, no increased work of breathing. GI: Abdomen is soft, nontender, nondistended, no abdominal masses GU: No CVA tenderness.  Lymph: No cervical or inguinal lymphadenopathy. Skin: No rashes, bruises or suspicious lesions. Neurologic: Grossly intact, no focal deficits, moving all 4 extremities. Psychiatric: Normal mood and affect.  Laboratory Data: Lab Results  Component Value Date   WBC 10.4 03/20/2022   HGB 13.0  03/20/2022   HCT 40.2 03/20/2022   MCV 92.8 03/20/2022   PLT 300 03/20/2022    Lab Results  Component Value Date   CREATININE 0.78 10/28/2022    No results found for: "PSA"  No results found for: "TESTOSTERONE"  Lab Results  Component Value Date   HGBA1C 5.9 (A) 04/13/2023    Urinalysis    Component Value Date/Time   COLORURINE AMBER (A) 03/20/2022 0932   APPEARANCEUR Clear 06/18/2023 1254   LABSPEC 1.015 03/20/2022 0932   PHURINE 8.0 03/20/2022 0932   GLUCOSEU Negative 06/18/2023 1254   HGBUR LARGE (A) 03/20/2022 0932   BILIRUBINUR Negative 06/18/2023 1254   KETONESUR NEGATIVE 03/20/2022 0932   PROTEINUR Negative 06/18/2023 1254   PROTEINUR 100 (A) 03/20/2022 0932   UROBILINOGEN 0.2 02/12/2015 1932   NITRITE Negative 06/18/2023 1254   NITRITE NEGATIVE 03/20/2022 0932   LEUKOCYTESUR Negative 06/18/2023 1254   LEUKOCYTESUR  MODERATE (A) 03/20/2022 0932    Lab Results  Component Value Date   LABMICR Comment 06/18/2023   WBCUA 0-5 01/20/2023   LABEPIT 0-10 01/20/2023   MUCUS Present 02/10/2022   BACTERIA None seen 01/20/2023    Pertinent Imaging: Renal US 09/21/2023: Images reviewed and discussed with the patient  Results for orders placed during the hospital encounter of 07/02/21  DG Abd 1 View  Narrative CLINICAL DATA:  Left-sided flank pain.  EXAM: ABDOMEN - 1 VIEW  COMPARISON:  Ultrasound renal 07/02/2021, CT renal 10/10/2019  FINDINGS: Right upper quadrant surgical clips. The bowel gas pattern is normal. No radio-opaque calculi or other significant radiographic abnormality are seen.  IMPRESSION: Negative radiograph.  These results and ultrasound renal results from same day will be called to the ordering clinician or representative by the Radiologist Assistant, and communication documented in the PACS or [employer].   Electronically Signed By: Tish Frederickson M.D. On: 07/03/2021 21:41  No results found for this or any previous visit.  No results found for this or any previous visit.  No results found for this or any previous visit.  Results for orders placed during the hospital encounter of 06/11/23  Ultrasound renal complete  Narrative CLINICAL DATA:  Nephrolithiasis.  EXAM: RENAL / URINARY TRACT ULTRASOUND COMPLETE  COMPARISON:  January 11, 2023  FINDINGS: Right Kidney:  Renal measurements: 12.7 x 4.8 x 5.8 cm = volume: 184.3 mL. Multiple right renal calculi, largest measures 6.1 mm. Echogenicity otherwise within normal limits. No mass or hydronephrosis visualized.  Left Kidney:  Renal measurements: 11.5 x 5.3 x 6.2 cm = volume: 198.8 mL. Echogenicity within normal limits. No mass or hydronephrosis visualized.  Bladder:  Appears normal for degree of bladder distention. Bilateral ureteral jets noted.  Other:  None.  IMPRESSION: Multiple right  renal calculi, largest measures 6.1 mm. No hydronephrosis.   Electronically Signed By: Sherian Rein M.D. On: 06/11/2023 13:12  No valid procedures specified. No results found for this or any previous visit.  Results for orders placed in visit on 09/08/22  CT RENAL STONE STUDY  Narrative CLINICAL DATA:  Left flank pain.  Nephrolithiasis.  EXAM: CT ABDOMEN AND PELVIS WITHOUT CONTRAST  TECHNIQUE: Multidetector CT imaging of the abdomen and pelvis was performed following the standard protocol without IV contrast.  RADIATION DOSE REDUCTION: This exam was performed according to the departmental dose-optimization program which includes automated exposure control, adjustment of the mA and/or kV according to patient size and/or use of iterative reconstruction technique.  COMPARISON:  03/20/2022  FINDINGS: Lower chest: No acute  findings.  Hepatobiliary: No mass visualized on this unenhanced exam. Moderate diffuse hepatic steatosis again noted. Prior cholecystectomy. No evidence of biliary obstruction.  Pancreas: No mass or inflammatory process visualized on this unenhanced exam.  Spleen:  Within normal limits in size.  Adrenals/Urinary tract: Bilateral ureteral stents have been removed since prior study. A few tiny less than 5 mm renal calculi are seen bilaterally. Moderate left hydronephrosis is seen due to a 7 mm calculus at the left UPJ.  Stomach/Bowel: Stable small hiatal hernia. No evidence of obstruction, inflammatory process, or abnormal fluid collections. Normal appendix visualized. Mild diverticulosis is seen involving the sigmoid colon, however there is no evidence of diverticulitis.  Vascular/Lymphatic: No pathologically enlarged lymph nodes identified. No evidence of abdominal aortic aneurysm.  Reproductive: Prior hysterectomy noted. Adnexal regions are unremarkable in appearance.  Other:  None.  Musculoskeletal:  No suspicious bone lesions  identified.  IMPRESSION: Moderate left hydronephrosis due to 7 mm calculus at the left UPJ.  Bilateral nephrolithiasis.  Stable small hiatal hernia.  Moderate hepatic steatosis.  Mild sigmoid diverticulosis, without radiographic evidence of diverticulitis.   Electronically Signed By: Danae Orleans M.D. On: 09/17/2022 12:32   Assessment & Plan:    1. Kidney stones -continue allopurinol 300mg  daily and urocit K daily  2. Bilateral renal stones -followup 6 months with a renal US - Urinalysis, Routine w reflex microscopic   No follow-ups on file.  [name], MD  [facility] [place]

## 2023-10-04 ENCOUNTER — Telehealth: Payer: 59 | Admitting: Physician Assistant

## 2023-10-04 DIAGNOSIS — L0291 Cutaneous abscess, unspecified: Secondary | ICD-10-CM | POA: Diagnosis not present

## 2023-10-04 MED ORDER — SULFAMETHOXAZOLE-TRIMETHOPRIM 800-160 MG PO TABS: 1.0000 | ORAL_TABLET | Freq: Two times a day (BID) | ORAL | 0 refills | Status: AC

## 2023-10-04 NOTE — Progress Notes (Addendum)
E-Visit for Cellulitis  We are sorry that you are not feeling well. Here is how we plan to help!  Based on what you shared with me it looks like you have cellulitis.  Cellulitis looks like areas of skin redness, swelling, and warmth; it develops as a result of bacteria entering under the skin. Little red spots and/or bleeding can be seen in skin, and tiny surface sacs containing fluid can occur. Fever can be present. Cellulitis is almost always on one side of a body, and the lower limbs are the most common site of involvement.   I have prescribed:  Bactrim DS 1 tablet by mouth twice a day for 7 days  Apply a warm compress several times per day.  If no improvement follow up in person as it may need to be drained.  Can take Tylenol or Ibuprofen as needed for pain.   HOME CARE:  Take your medications as ordered and take all of them, even if the skin irritation appears to be healing.   GET HELP RIGHT AWAY IF:  Symptoms that don't begin to go away within 48 hours. Severe redness persists or worsens If the area turns color, spreads or swells. If it blisters and opens, develops yellow-brown crust or bleeds. You develop a fever or chills. If the pain increases or becomes unbearable.  Are unable to keep fluids and food down.  MAKE SURE YOU   Understand these instructions. Will watch your condition. Will get help right away if you are not doing well or get worse.  Thank you for choosing an e-visit.  Your e-visit answers were reviewed by a board certified advanced clinical practitioner to complete your personal care plan. Depending upon the condition, your plan could have included both over the counter or prescription medications.  Please review your pharmacy choice. Make sure the pharmacy is open so you can pick up prescription now. If there is a problem, you may contact your provider through [employer] and have the prescription routed to another pharmacy.  Your safety is important to  [place]. If you have drug allergies check your prescription carefully.   For the next 24 hours you can use MyChart to ask questions about today's visit, request a non-urgent call back, or ask for a work or school excuse. You will get an email in the next two days asking about your experience. I hope that your e-visit has been valuable and will speed your recovery.   I have spent 5 minutes in review of e-visit questionnaire, review and updating patient chart, medical decision making and response to patient.   Tylene Fantasia Ward, PA-C

## 2023-10-11 ENCOUNTER — Ambulatory Visit: Payer: 59 | Admitting: Nurse Practitioner

## 2023-11-26 ENCOUNTER — Telehealth: Payer: 59 | Admitting: Family Medicine

## 2023-11-26 DIAGNOSIS — J441 Chronic obstructive pulmonary disease with (acute) exacerbation: Secondary | ICD-10-CM

## 2023-11-26 MED ORDER — FLUTICASONE PROPIONATE 50 MCG/ACT NA SUSP: 2.0000 | Freq: Every day | NASAL | 6 refills | Status: AC

## 2023-11-26 MED ORDER — BENZONATATE 200 MG PO CAPS
200.0000 mg | ORAL_CAPSULE | Freq: Two times a day (BID) | ORAL | 0 refills | Status: DC | PRN
Start: 2023-11-26 — End: 2024-05-24

## 2023-11-26 NOTE — Progress Notes (Signed)

## 2023-12-03 ENCOUNTER — Telehealth: Payer: Self-pay | Admitting: Urology

## 2023-12-03 ENCOUNTER — Other Ambulatory Visit: Payer: Self-pay | Admitting: Urology

## 2023-12-03 ENCOUNTER — Other Ambulatory Visit: Payer: Self-pay

## 2023-12-03 DIAGNOSIS — N200 Calculus of kidney: Secondary | ICD-10-CM

## 2023-12-03 MED ORDER — PROMETHAZINE HCL 12.5 MG PO TABS: 12.5000 mg | ORAL_TABLET | Freq: Four times a day (QID) | ORAL | 0 refills | Status: DC | PRN

## 2023-12-03 MED ORDER — OXYCODONE-ACETAMINOPHEN 5-325 MG PO TABS: 1.0000 | ORAL_TABLET | ORAL | 0 refills | Status: DC | PRN

## 2023-12-03 NOTE — Addendum Note (Signed)
 Addended by: Christoper Fabian R on: 12/03/2023 02:14 PM   Modules accepted: Orders

## 2023-12-03 NOTE — Telephone Encounter (Signed)
 Patient would like to know if you can call her in pain medication and something for nausea she is having pain from kidney stone. Patient states the wait at the ED is too long.   [facility]

## 2023-12-09 ENCOUNTER — Ambulatory Visit (HOSPITAL_COMMUNITY): Admission: RE | Admit: 2023-12-09 | Payer: 59 | Source: Ambulatory Visit

## 2023-12-13 ENCOUNTER — Telehealth: Payer: 59 | Admitting: Family Medicine

## 2023-12-13 DIAGNOSIS — H00015 Hordeolum externum left lower eyelid: Secondary | ICD-10-CM | POA: Diagnosis not present

## 2023-12-13 MED ORDER — ERYTHROMYCIN 5 MG/GM OP OINT: 1.0000 | TOPICAL_OINTMENT | Freq: Every day | OPHTHALMIC | 0 refills | Status: DC

## 2023-12-13 NOTE — Progress Notes (Signed)
  E-Visit for Stye   We are sorry that you are not feeling well. Here is how we plan to help!  Based on what you have shared with me it looks like you have a stye.  A stye is an inflammation of the eyelid.  It is often a red, painful lump near the edge of the eyelid that may look like a boil or a pimple.  A stye develops when an infection occurs at the base of an eyelash.   We have made appropriate suggestions for you based upon your presentation: Simple styes can be treated without medical intervention.  Most styes either resolve spontaneously or resolve with simple home treatment by applying warm compresses or heated washcloth to the stye for about 10-15 minutes three to four times a day. This causes the stye to drain and resolve.   Given you have been doing this, I am going to order an ointment for you to take.   HOME CARE:  Wash your hands often! Let the stye open on its own. Don't squeeze or open it. Don't rub your eyes. This can irritate your eyes and let in bacteria.  If you need to touch your eyes, wash your hands first. Don't wear eye makeup or contact lenses until the area has healed.  GET HELP RIGHT AWAY IF:  Your symptoms do not improve. You develop blurred or loss of vision. Your symptoms worsen (increased discharge, pain or redness).   Thank you for choosing an e-visit.  Your e-visit answers were reviewed by a board certified advanced clinical practitioner to complete your personal care plan. Depending upon the condition, your plan could have included both over the counter or prescription medications.  Please review your pharmacy choice. Make sure the pharmacy is open so you can pick up prescription now. If there is a problem, you may contact your provider through [employer] and have the prescription routed to another pharmacy.  Your safety is important to us . If you have drug allergies check your prescription carefully.   For the next 24 hours you can use MyChart  to ask questions about today's visit, request a non-urgent call back, or ask for a work or school excuse. You will get an email in the next two days asking about your experience. I hope that your e-visit has been valuable and will speed your recovery.   I provided 5 minutes of non face-to-face time during this encounter for chart review, medication and order placement, as well as and documentation.

## 2023-12-14 ENCOUNTER — Ambulatory Visit: Payer: 59 | Admitting: Urology

## 2023-12-23 NOTE — Progress Notes (Deleted)
 Name: [name] DOB: 05/08/1976 MRN: [id number]  History of Present Illness: [name] is a 48 y.o. female who presents today for follow up visit at [facility]. ***She is accompanied by ***. - GU History: 1. Kidney stones (recurrent). - 11/02/2022: Stone analysis showed 80% brushite and 20% calcium oxalate composition. 2. Left renal cyst (benign).  At last visit with Dr. Ronne Binning on 09/22/2023: - Asymptomatic.  - RUS showed bilateral nonobstructing nephrolithiasis and a benign left renal cyst. - Advised to continue Allopurinol 300mg  daily and Urocit K daily.  Since last visit: > 12/03/2023: Patient called reporting "pain from kidney stone." Dr. Ronne Binning prescribed Percocet 5-325 mg #30 and Phenergan.  Today: - KUB today: Awaiting radiology read; *** appreciated per provider interpretation. - RUS today: Awaiting radiology read; *** appreciated per provider interpretation.  She {Actions; denies-reports:120008} recent stone passage. She {Actions; denies-reports:120008} flank pain or abdominal pain. She {Actions; denies-reports:120008} fevers, nausea, or vomiting.  She {Actions; denies-reports:120008} increased urinary urgency, frequency, nocturia, dysuria, gross hematuria, hesitancy, straining to void, or sensations of incomplete emptying.   Fall Screening: Do you usually have a device to assist in your mobility? {yes/no:20286}  ***cane / ***walker / ***wheelchair  Medications: Current Outpatient Medications  Medication Sig Dispense Refill   allopurinol (ZYLOPRIM) 300 MG tablet Take 1 tablet (300 mg total) by mouth daily. 30 tablet 11   benzonatate (TESSALON) 100 MG capsule Take 1 capsule (100 mg total) by mouth 3 (three) times daily as needed. 30 capsule 0   benzonatate (TESSALON) 200 MG capsule Take 1 capsule (200 mg total) by mouth 2 (two) times daily as needed for cough. 20 capsule 0   dicyclomine (BENTYL) 20 MG tablet Take 20 mg by mouth  4 (four) times daily as needed for spasms.     erythromycin ophthalmic ointment Place 1 Application into the left eye at bedtime. Apply a thin ribbon line to the eye lid 3.5 g 0   fluticasone (FLONASE) 50 MCG/ACT nasal spray Place 2 sprays into both nostrils daily. 16 g 6   indapamide (LOZOL) 2.5 MG tablet Take 1 tablet (2.5 mg total) by mouth daily. 30 tablet 11   Multiple Vitamin (MULTIVITAMIN WITH MINERALS) TABS tablet Take 1 tablet by mouth daily.     oxyCODONE-acetaminophen (PERCOCET) 5-325 MG tablet Take 1 tablet by mouth every 4 (four) hours as needed. 30 tablet 0   Potassium Citrate (UROCIT-K 15) 15 MEQ (1620 MG) TBCR Take 1 tablet by mouth daily. 30 tablet 11   predniSONE (STERAPRED UNI-PAK 21 TAB) 10 MG (21) TBPK tablet 6 day taper; take as directed on package instructions (Patient not taking: Reported on 06/18/2023) 21 tablet 0   promethazine (PHENERGAN) 12.5 MG tablet Take 1 tablet (12.5 mg total) by mouth every 6 (six) hours as needed for nausea or vomiting. 30 tablet 0   sodium bicarbonate 650 MG tablet Take 1 tablet (650 mg total) by mouth 2 (two) times daily. 60 tablet 11   tirzepatide (ZEPBOUND) 2.5 MG/0.5ML Pen Inject 2.5 mg into the skin once a week. 2 mL 0   tirzepatide (ZEPBOUND) 5 MG/0.5ML Pen Inject 5 mg into the skin once a week. 3 mL 1   No current facility-administered medications for this visit.    Allergies: Allergies  Allergen Reactions   Hydrocodone Nausea And Vomiting   Morphine And Codeine Hives and Other (See Comments)    Welps   Vicodin [Hydrocodone-Acetaminophen] Nausea And Vomiting    Past Medical History:  Diagnosis Date   Arthritis    Asthma    COPD (chronic obstructive pulmonary disease) (HCC)    sees PCP   Depression    Diabetes mellitus without complication (HCC)    GERD (gastroesophageal reflux disease)    History of kidney stones    Hypertension    pt. denies  never taken meds   Pneumonia    PONV (postoperative nausea and vomiting)     Past Surgical History:  Procedure Laterality Date   ABDOMINAL HYSTERECTOMY     CHOLECYSTECTOMY     CYSTOSCOPY W/ RETROGRADES  08/04/2011   Procedure: CYSTOSCOPY WITH RETROGRADE PYELOGRAM;  Surgeon: Ky Barban;  Location: [facility];  Service: Urology;  Laterality: Right;  Right Retrograde   CYSTOSCOPY WITH RETROGRADE PYELOGRAM, URETEROSCOPY AND STENT PLACEMENT Left 07/24/2021   Procedure: CYSTOSCOPY WITH RETROGRADE PYELOGRAM, URETEROSCOPY AND STENT PLACEMENT;  Surgeon: Malen Gauze, MD;  Location: [facility];  Service: Urology;  Laterality: Left;   CYSTOSCOPY WITH RETROGRADE PYELOGRAM, URETEROSCOPY AND STENT PLACEMENT Bilateral 03/02/2022   Procedure: CYSTOSCOPY WITH RETROGRADE PYELOGRAM, RIGHT URETEROSCOPY AND BILATERAL STENT PLACEMENT;  Surgeon: Malen Gauze, MD;  Location: [facility];  Service: Urology;  Laterality: Bilateral;   CYSTOSCOPY WITH RETROGRADE PYELOGRAM, URETEROSCOPY AND STENT PLACEMENT Bilateral 03/23/2022   Procedure: CYSTOSCOPY WITH RETROGRADE PYELOGRAM, URETEROSCOPY AND STENT REMOVAL;  Surgeon: Malen Gauze, MD;  Location: [facility];  Service: Urology;  Laterality: Bilateral;   CYSTOSCOPY WITH RETROGRADE PYELOGRAM, URETEROSCOPY AND STENT PLACEMENT Left 11/02/2022   Procedure: CYSTOSCOPY WITH RETROGRADE PYELOGRAM, URETEROSCOPY AND STENT PLACEMENT;  Surgeon: Malen Gauze, MD;  Location: [facility];  Service: Urology;  Laterality: Left;   HOLMIUM LASER APPLICATION N/A 04/15/2018   Procedure: HOLMIUM LASER APPLICATION;  Surgeon: Crist Fat, MD;  Location: [facility];  Service: Urology;  Laterality: N/A;   HOLMIUM LASER APPLICATION Left 07/24/2021   Procedure: HOLMIUM LASER APPLICATION;  Surgeon: Malen Gauze, MD;  Location: [facility];  Service: Urology;  Laterality: Left;   HOLMIUM LASER APPLICATION Bilateral 03/02/2022   Procedure: HOLMIUM LASER APPLICATION;  Surgeon: Malen Gauze, MD;  Location: [facility];  Service: Urology;  Laterality: Bilateral;   HOLMIUM  LASER APPLICATION Left 11/02/2022   Procedure: HOLMIUM LASER APPLICATION;  Surgeon: Malen Gauze, MD;  Location: [facility];  Service: Urology;  Laterality: Left;   LITHOTRIPSY     NEPHROLITHOTOMY Right 04/15/2018   Procedure: RIGHT NEPHROLITHOTOMY PERCUTANEOUS WITH ACCESS;  Surgeon: Crist Fat, MD;  Location: [facility];  Service: Urology;  Laterality: Right;   NEPHROLITHOTOMY Right 12/29/2018   Procedure: NEPHROLITHOTOMY PERCUTANEOUS;  Surgeon: Crist Fat, MD;  Location: [facility];  Service: Urology;  Laterality: Right;   PERCUTANEOUS NEPHROLITHOTRIPSY  2012   both sides kidney    STONE EXTRACTION WITH BASKET Bilateral 03/02/2022   Procedure: STONE EXTRACTION WITH BASKET;  Surgeon: Malen Gauze, MD;  Location: [facility];  Service: Urology;  Laterality: Bilateral;   STONE EXTRACTION WITH BASKET Bilateral 03/23/2022   Procedure: STONE EXTRACTION WITH BASKET;  Surgeon: Malen Gauze, MD;  Location: [facility];  Service: Urology;  Laterality: Bilateral;   TUBAL LIGATION     Family History  Problem Relation Age of Onset   COPD Mother    Cancer Father    COPD Father    Heart attack Father    Social History   Socioeconomic History   Marital status: Single    Spouse name: Not on file   Number of children: Not on  file   Years of education: Not on file   Highest education level: Not on file  Occupational History   Not on file  Tobacco Use   Smoking status: Former    Current packs/day: 0.00    Average packs/day: 0.5 packs/day for 18.0 years (9.0 ttl pk-yrs)    Types: Cigarettes    Start date: 01/21/2003    Quit date: 01/21/2021    Years since quitting: 2.9   Smokeless tobacco: Never  Vaping Use   Vaping status: Never Used  Substance and Sexual Activity   Alcohol use: No   Drug use: No   Sexual activity: Yes    Birth control/protection: Surgical  Other Topics Concern   Not on file  Social History Narrative   Not on file   Social Drivers of Health   Financial  Resource Strain: Not on file  Food Insecurity: Not on file  Transportation Needs: Not on file  Physical Activity: Not on file  Stress: Not on file  Social Connections: Not on file  Intimate Partner Violence: Not on file    SUBJECTIVE  Review of Systems Constitutional: Patient denies any unintentional weight loss or change in strength lntegumentary: Patient denies any rashes or pruritus Cardiovascular: Patient denies chest pain or syncope Respiratory: Patient denies shortness of breath Gastrointestinal: Patient ***denies nausea, vomiting, constipation, or diarrhea Musculoskeletal: Patient denies muscle cramps or weakness Neurologic: Patient denies convulsions or seizures Allergic/Immunologic: Patient denies recent allergic reaction(s) Hematologic/Lymphatic: Patient denies bleeding tendencies Endocrine: Patient denies heat/cold intolerance  GU: As per HPI.  OBJECTIVE There were no vitals filed for this visit. There is no height or weight on file to calculate BMI.  Physical Examination Constitutional: No obvious distress; patient is non-toxic appearing  Cardiovascular: No visible lower extremity edema.  Respiratory: The patient does not have audible wheezing/stridor; respirations do not appear labored  Gastrointestinal: Abdomen non-distended Musculoskeletal: Normal ROM of UEs  Skin: No obvious rashes/open sores  Neurologic: CN 2-12 grossly intact Psychiatric: Answered questions appropriately with normal affect  Hematologic/Lymphatic/Immunologic: No obvious bruises or sites of spontaneous bleeding  UA: ***negative / *** WBC/hpf, *** RBC/hpf, *** bacteria ***Urine microscopy: ***negative / *** WBC/hpf, *** RBC/hpf, *** bacteria ***with no evidence of UTI ***with no evidence of microscopic hematuria ***otherwise unremarkable  PVR: *** ml  ASSESSMENT No diagnosis found.  ***We reviewed recent imaging results; ***awaiting radiology results, appears to have ***no acute  findings per provider interpretation.  ***For stone prevention: Advised adequate hydration and we discussed option to consider low oxalate diet given that calcium oxalate is the most common type of stone. Handout provided about stone prevention diet.  ***For recurrent stone formers: We discussed option to proceed with 24 hour urinalysis ([facility]) for metabolic stone evaluation, which may help with targeted recommendations for dietary I medication therapies for stone prevention. Patient elected to ***proceed/ ***hold off.  Will plan to follow up in ***6 months / ***1 year with ***KUB ***RUS for stone surveillance or sooner if needed.  Patient verbalized understanding of and agreement with current plan. All questions were answered.  PLAN Advised the following: Maintain adequate fluid intake daily. Drink citrus juice (lemon, lime or orange juice) routinely. Low oxalate diet. No follow-ups on file.  No orders of the defined types were placed in this encounter.   It has been explained that the patient is to follow regularly with their PCP in addition to all other providers involved in their care and to follow instructions provided by these respective offices. Patient  advised to contact urology clinic if any urologic-pertaining questions, concerns, new symptoms or problems arise in the interim period.  There are no Patient Instructions on file for this visit.  Electronically signed by:  Donnita Falls, MSN, FNP-C, CUNP 12/23/2023 4:01 PM

## 2024-01-03 ENCOUNTER — Ambulatory Visit: Payer: 59 | Admitting: Urology

## 2024-01-03 DIAGNOSIS — N200 Calculus of kidney: Secondary | ICD-10-CM

## 2024-01-12 ENCOUNTER — Ambulatory Visit (HOSPITAL_COMMUNITY)
Admission: RE | Admit: 2024-01-12 | Discharge: 2024-01-12 | Disposition: A | Discharge status: Home / Self Care | Payer: No Typology Code available for payment source | Source: Ambulatory Visit | Attending: Urology | Admitting: Urology

## 2024-01-12 DIAGNOSIS — N200 Calculus of kidney: Secondary | ICD-10-CM | POA: Diagnosis present

## 2024-01-28 ENCOUNTER — Ambulatory Visit (HOSPITAL_COMMUNITY)
Admission: RE | Admit: 2024-01-28 | Discharge: 2024-01-28 | Disposition: A | Discharge status: Home / Self Care | Payer: No Typology Code available for payment source | Source: Ambulatory Visit | Attending: Urology | Admitting: Urology

## 2024-01-28 ENCOUNTER — Ambulatory Visit: Payer: No Typology Code available for payment source | Admitting: Urology

## 2024-01-28 ENCOUNTER — Encounter: Payer: Self-pay | Admitting: Urology

## 2024-01-28 VITALS — BP 132/83 | HR 84

## 2024-01-28 DIAGNOSIS — N200 Calculus of kidney: Secondary | ICD-10-CM | POA: Insufficient documentation

## 2024-01-28 DIAGNOSIS — Z87442 Personal history of urinary calculi: Secondary | ICD-10-CM

## 2024-01-28 DIAGNOSIS — Z09 Encounter for follow-up examination after completed treatment for conditions other than malignant neoplasm: Secondary | ICD-10-CM

## 2024-01-28 LAB — URINALYSIS, ROUTINE W REFLEX MICROSCOPIC
Bilirubin, UA: NEGATIVE
Glucose, UA: NEGATIVE
Nitrite, UA: NEGATIVE
Protein,UA: NEGATIVE
RBC, UA: NEGATIVE
Specific Gravity, UA: 1.02 (ref 1.005–1.030)
Urobilinogen, Ur: 0.2 mg/dL (ref 0.2–1.0)
pH, UA: 7 (ref 5.0–7.5)

## 2024-01-28 LAB — MICROSCOPIC EXAMINATION: Bacteria, UA: NONE SEEN

## 2024-01-28 MED ORDER — INDAPAMIDE 2.5 MG PO TABS: 2.5000 mg | ORAL_TABLET | Freq: Every day | ORAL | 11 refills | Status: DC

## 2024-01-28 NOTE — Progress Notes (Signed)
 Name: [name] DOB: [birth year] MRN: [id number]  History of Present Illness: Ms. [name] is a 48 y.o. female who presents today for follow up visit at [facility]. - GU History: 1. Kidney stones (recurrent). - 11/02/2022: Stone analysis showed 80% brushite and 20% calcium oxalate composition. 2. Left renal cyst (benign).  At last visit with Dr. Ronne Binning on 09/22/2023: - Asymptomatic.  - RUS showed bilateral nonobstructing nephrolithiasis and a benign left renal cyst. - Advised to continue Allopurinol 300mg  daily and Urocit K daily.  Since last visit: > 12/03/2023: Patient called reporting "pain from kidney stone." Dr. Ronne Binning prescribed Percocet 5-325 mg #30 and Phenergan.  > 01/12/2024: RUS showed bilateral non-obstructing renal calculi (7 mm in right lower pole; 1.0 cm at left lower pole) and a small simple left renal cyst.  Today: - KUB today: Awaiting radiology read; no ureteral stones appreciated per provider interpretation.  She denies recent stone passage. She denies flank pain or abdominal pain.  She denies increased urinary urgency, frequency, nocturia, dysuria, gross hematuria, hesitancy, straining to void, or sensations of incomplete emptying.   Fall Screening: Do you usually have a device to assist in your mobility? No   Medications: Current Outpatient Medications  Medication Sig Dispense Refill   allopurinol (ZYLOPRIM) 300 MG tablet Take 1 tablet (300 mg total) by mouth daily. 30 tablet 11   benzonatate (TESSALON) 100 MG capsule Take 1 capsule (100 mg total) by mouth 3 (three) times daily as needed. 30 capsule 0   benzonatate (TESSALON) 200 MG capsule Take 1 capsule (200 mg total) by mouth 2 (two) times daily as needed for cough. 20 capsule 0   erythromycin ophthalmic ointment Place 1 Application into the left eye at bedtime. Apply a thin ribbon line to the eye lid 3.5 g 0   fluticasone (FLONASE) 50 MCG/ACT nasal spray Place 2  sprays into both nostrils daily. 16 g 6   Multiple Vitamin (MULTIVITAMIN WITH MINERALS) TABS tablet Take 1 tablet by mouth daily.     oxyCODONE-acetaminophen (PERCOCET) 5-325 MG tablet Take 1 tablet by mouth every 4 (four) hours as needed. 30 tablet 0   Potassium Citrate (UROCIT-K 15) 15 MEQ (1620 MG) TBCR Take 1 tablet by mouth daily. 30 tablet 11   promethazine (PHENERGAN) 12.5 MG tablet Take 1 tablet (12.5 mg total) by mouth every 6 (six) hours as needed for nausea or vomiting. 30 tablet 0   sodium bicarbonate 650 MG tablet Take 1 tablet (650 mg total) by mouth 2 (two) times daily. 60 tablet 11   dicyclomine (BENTYL) 20 MG tablet Take 20 mg by mouth 4 (four) times daily as needed for spasms.     indapamide (LOZOL) 2.5 MG tablet Take 1 tablet (2.5 mg total) by mouth daily. 30 tablet 11   No current facility-administered medications for this visit.    Allergies: Allergies  Allergen Reactions   Hydrocodone Nausea And Vomiting   Morphine And Codeine Hives and Other (See Comments)    Welps   Vicodin [Hydrocodone-Acetaminophen] Nausea And Vomiting    Past Medical History:  Diagnosis Date   Arthritis    Asthma    COPD (chronic obstructive pulmonary disease) (HCC)    sees PCP   Depression    Diabetes mellitus without complication (HCC)    GERD (gastroesophageal reflux disease)    History of kidney stones    Hypertension    pt. denies  never taken meds   Pneumonia    PONV (  postoperative nausea and vomiting)    Past Surgical History:  Procedure Laterality Date   ABDOMINAL HYSTERECTOMY     CHOLECYSTECTOMY     CYSTOSCOPY W/ RETROGRADES  08/04/2011   Procedure: CYSTOSCOPY WITH RETROGRADE PYELOGRAM;  Surgeon: Ky Barban;  Location: [facility];  Service: Urology;  Laterality: Right;  Right Retrograde   CYSTOSCOPY WITH RETROGRADE PYELOGRAM, URETEROSCOPY AND STENT PLACEMENT Left 07/24/2021   Procedure: CYSTOSCOPY WITH RETROGRADE PYELOGRAM, URETEROSCOPY AND STENT PLACEMENT;  Surgeon:  Malen Gauze, MD;  Location: [facility];  Service: Urology;  Laterality: Left;   CYSTOSCOPY WITH RETROGRADE PYELOGRAM, URETEROSCOPY AND STENT PLACEMENT Bilateral 03/02/2022   Procedure: CYSTOSCOPY WITH RETROGRADE PYELOGRAM, RIGHT URETEROSCOPY AND BILATERAL STENT PLACEMENT;  Surgeon: Malen Gauze, MD;  Location: [facility];  Service: Urology;  Laterality: Bilateral;   CYSTOSCOPY WITH RETROGRADE PYELOGRAM, URETEROSCOPY AND STENT PLACEMENT Bilateral 03/23/2022   Procedure: CYSTOSCOPY WITH RETROGRADE PYELOGRAM, URETEROSCOPY AND STENT REMOVAL;  Surgeon: Malen Gauze, MD;  Location: [facility];  Service: Urology;  Laterality: Bilateral;   CYSTOSCOPY WITH RETROGRADE PYELOGRAM, URETEROSCOPY AND STENT PLACEMENT Left 11/02/2022   Procedure: CYSTOSCOPY WITH RETROGRADE PYELOGRAM, URETEROSCOPY AND STENT PLACEMENT;  Surgeon: Malen Gauze, MD;  Location: [facility];  Service: Urology;  Laterality: Left;   HOLMIUM LASER APPLICATION N/A 04/15/2018   Procedure: HOLMIUM LASER APPLICATION;  Surgeon: Crist Fat, MD;  Location: [facility];  Service: Urology;  Laterality: N/A;   HOLMIUM LASER APPLICATION Left 07/24/2021   Procedure: HOLMIUM LASER APPLICATION;  Surgeon: Malen Gauze, MD;  Location: [facility];  Service: Urology;  Laterality: Left;   HOLMIUM LASER APPLICATION Bilateral 03/02/2022   Procedure: HOLMIUM LASER APPLICATION;  Surgeon: Malen Gauze, MD;  Location: [facility];  Service: Urology;  Laterality: Bilateral;   HOLMIUM LASER APPLICATION Left 11/02/2022   Procedure: HOLMIUM LASER APPLICATION;  Surgeon: Malen Gauze, MD;  Location: [facility];  Service: Urology;  Laterality: Left;   LITHOTRIPSY     NEPHROLITHOTOMY Right 04/15/2018   Procedure: RIGHT NEPHROLITHOTOMY PERCUTANEOUS WITH ACCESS;  Surgeon: Crist Fat, MD;  Location: [facility];  Service: Urology;  Laterality: Right;   NEPHROLITHOTOMY Right 12/29/2018   Procedure: NEPHROLITHOTOMY PERCUTANEOUS;  Surgeon: Crist Fat,  MD;  Location: [facility];  Service: Urology;  Laterality: Right;   PERCUTANEOUS NEPHROLITHOTRIPSY  2012   both sides kidney    STONE EXTRACTION WITH BASKET Bilateral 03/02/2022   Procedure: STONE EXTRACTION WITH BASKET;  Surgeon: Malen Gauze, MD;  Location: [facility];  Service: Urology;  Laterality: Bilateral;   STONE EXTRACTION WITH BASKET Bilateral 03/23/2022   Procedure: STONE EXTRACTION WITH BASKET;  Surgeon: Malen Gauze, MD;  Location: [facility];  Service: Urology;  Laterality: Bilateral;   TUBAL LIGATION     Family History  Problem Relation Age of Onset   COPD Mother    Cancer Father    COPD Father    Heart attack Father    Social History   Socioeconomic History   Marital status: Single    Spouse name: Not on file   Number of children: Not on file   Years of education: Not on file   Highest education level: Not on file  Occupational History   Not on file  Tobacco Use   Smoking status: Former    Current packs/day: 0.00    Average packs/day: 0.5 packs/day for 18.0 years (9.0 ttl pk-yrs)    Types: Cigarettes    Start date: 01/21/2003    Quit date:  01/21/2021    Years since quitting: 3.0   Smokeless tobacco: Never  Vaping Use   Vaping status: Never Used  Substance and Sexual Activity   Alcohol use: No   Drug use: No   Sexual activity: Yes    Birth control/protection: Surgical  Other Topics Concern   Not on file  Social History Narrative   Not on file   Social Drivers of Health   Financial Resource Strain: Not on file  Food Insecurity: Not on file  Transportation Needs: Not on file  Physical Activity: Not on file  Stress: Not on file  Social Connections: Not on file  Intimate Partner Violence: Not on file    SUBJECTIVE  Review of Systems Constitutional: Patient denies any unintentional weight loss or change in strength lntegumentary: Patient denies any rashes or pruritus Cardiovascular: Patient denies chest pain or syncope Respiratory: Patient denies  shortness of breath Gastrointestinal: Patient denies nausea, vomiting, constipation, or diarrhea Musculoskeletal: Patient denies muscle cramps or weakness Neurologic: Patient denies convulsions or seizures Allergic/Immunologic: Patient denies recent allergic reaction(s) Hematologic/Lymphatic: Patient denies bleeding tendencies Endocrine: Patient denies heat/cold intolerance  GU: As per HPI.  OBJECTIVE Vitals:   01/28/24 1239  BP: 132/83  Pulse: 84   There is no height or weight on file to calculate BMI.  Physical Examination Constitutional: No obvious distress; patient is non-toxic appearing  Cardiovascular: No visible lower extremity edema.  Respiratory: The patient does not have audible wheezing/stridor; respirations do not appear labored  Gastrointestinal: Abdomen non-distended Musculoskeletal: Normal ROM of UEs  Skin: No obvious rashes/open sores  Neurologic: CN 2-12 grossly intact Psychiatric: Answered questions appropriately with normal affect  Hematologic/Lymphatic/Immunologic: No obvious bruises or sites of spontaneous bleeding  Urine microscopy: 6-10 WBC/hpf, 0-2 RBC/hpf, no bacteria  ASSESSMENT Kidney stones - Plan: Urinalysis, Routine w reflex microscopic, DG Abd 1 View  Bilateral renal stones - Plan: indapamide (LOZOL) 2.5 MG tablet, DG Abd 1 View  We reviewed recent imaging results; awaiting radiology results, appears to have no acute findings per provider interpretation.  Will plan to follow up in 6 months with KUB for stone surveillance or sooner if needed. Patient verbalized understanding of and agreement with current plan. All questions were answered.  PLAN Advised the following: Return in about 6 months (around 07/27/2024) for KUB, UA, & f/u with Evette Georges NP.  Orders Placed This Encounter  Procedures   DG Abd 1 View    Standing Status:   Future    Expected Date:   07/27/2024    Expiration Date:   01/27/2025    Reason for Exam (SYMPTOM  OR  DIAGNOSIS REQUIRED):   kidney stone    Is patient pregnant?:   Unknown (Please Explain)    Preferred imaging location?:   [facility]   Urinalysis, Routine w reflex microscopic    It has been explained that the patient is to follow regularly with their PCP in addition to all other providers involved in their care and to follow instructions provided by these respective offices. Patient advised to contact urology clinic if any urologic-pertaining questions, concerns, new symptoms or problems arise in the interim period.  There are no Patient Instructions on file for this visit.  Electronically signed by:  Donnita Falls, MSN, FNP-C, CUNP 01/28/2024 1:10 PM

## 2024-02-16 ENCOUNTER — Telehealth: Admitting: Physician Assistant

## 2024-02-16 DIAGNOSIS — L03311 Cellulitis of abdominal wall: Secondary | ICD-10-CM

## 2024-02-16 MED ORDER — CEPHALEXIN 500 MG PO CAPS: 500.0000 mg | ORAL_CAPSULE | Freq: Four times a day (QID) | ORAL | 0 refills | Status: DC

## 2024-02-16 MED ORDER — FLUCONAZOLE 150 MG PO TABS: ORAL_TABLET | ORAL | 0 refills | Status: DC

## 2024-02-16 NOTE — Progress Notes (Signed)
 E Visit for Cellulitis  We are sorry that you are not feeling well. Here is how we plan to help!  Based on what you shared with me it looks like you have cellulitis.  Cellulitis looks like areas of skin redness, swelling, and warmth; it develops as a result of bacteria entering under the skin. Little red spots and/or bleeding can be seen in skin, and tiny surface sacs containing fluid can occur. Fever can be present. Cellulitis is almost always on one side of a body, and the lower limbs are the most common site of involvement.   I have prescribed:  Keflex 500mg  take one by mouth four times a day for 5 days.  HOME CARE:  . Take your medications as ordered and take all of them, even if the skin irritation appears to be healing.   GET HELP RIGHT AWAY IF:  . Symptoms that don't begin to go away within 48 hours. . Severe redness persists or worsens . If the area turns color, spreads or swells. . If it blisters and opens, develops yellow-brown crust or bleeds. . You develop a fever or chills. . If the pain increases or becomes unbearable.  . Are unable to keep fluids and food down.  MAKE SURE YOU    Understand these instructions.  Will watch your condition.  Will get help right away if you are not doing well or get worse.  Thank you for choosing an e-visit. Your e-visit answers were reviewed by a board certified advanced clinical practitioner to complete your personal care plan. Depending upon the condition, your plan could have included both over the counter or prescription medications. Please review your pharmacy choice. Make sure the pharmacy is open so you can pick up prescription now. If there is a problem, you may contact your provider through [employer] and have the prescription routed to another pharmacy. Your safety is important to [place]. If you have drug allergies check your prescription carefully.  For the next 24 hours you can use MyChart to ask questions about today's  visit, request a non-urgent call back, or ask for a work or school excuse. You will get an email in the next two days asking about your experience. I hope that your e-visit has been valuable and will speed your recovery.

## 2024-02-16 NOTE — Progress Notes (Signed)
 I have spent 5 minutes in review of e-visit questionnaire, review and updating patient chart, medical decision making and response to patient.   Piedad Climes, PA-C

## 2024-02-16 NOTE — Addendum Note (Signed)
 Addended by: Waldon Merl on: 02/16/2024 06:51 PM   Modules accepted: Orders

## 2024-02-16 NOTE — Progress Notes (Signed)
 Message sent to patient requesting further input regarding current symptoms. Awaiting patient response.

## 2024-03-17 ENCOUNTER — Other Ambulatory Visit (HOSPITAL_COMMUNITY): Payer: 59

## 2024-03-24 ENCOUNTER — Ambulatory Visit: Payer: 59 | Admitting: Urology

## 2024-04-04 ENCOUNTER — Telehealth: Admitting: Family Medicine

## 2024-04-04 DIAGNOSIS — L03311 Cellulitis of abdominal wall: Secondary | ICD-10-CM | POA: Diagnosis not present

## 2024-04-04 DIAGNOSIS — L0390 Cellulitis, unspecified: Secondary | ICD-10-CM | POA: Diagnosis not present

## 2024-04-04 MED ORDER — CEPHALEXIN 500 MG PO CAPS: 500.0000 mg | ORAL_CAPSULE | Freq: Three times a day (TID) | ORAL | 0 refills | Status: AC

## 2024-04-04 MED ORDER — FLUCONAZOLE 150 MG PO TABS: 150.0000 mg | ORAL_TABLET | Freq: Every day | ORAL | 0 refills | Status: AC

## 2024-04-04 NOTE — Progress Notes (Signed)
E Visit for Cellulitis  We are sorry that you are not feeling well. Here is how we plan to help!  Based on what you shared with me it looks like you have cellulitis.  Cellulitis looks like areas of skin redness, swelling, and warmth; it develops as a result of bacteria entering under the skin. Little red spots and/or bleeding can be seen in skin, and tiny surface sacs containing fluid can occur. Fever can be present. Cellulitis is almost always on one side of a body, and the lower limbs are the most common site of involvement.   I have prescribed:  Keflex 500mg  take one by mouth four times a day for 5 days  HOME CARE:  Take your medications as ordered and take all of them, even if the skin irritation appears to be healing.   GET HELP RIGHT AWAY IF:  Symptoms that don't begin to go away within 48 hours. Severe redness persists or worsens If the area turns color, spreads or swells. If it blisters and opens, develops yellow-brown crust or bleeds. You develop a fever or chills. If the pain increases or becomes unbearable.  Are unable to keep fluids and food down.  MAKE SURE YOU   Understand these instructions. Will watch your condition. Will get help right away if you are not doing well or get worse.  Thank you for choosing an e-visit.  Your e-visit answers were reviewed by a board certified advanced clinical practitioner to complete your personal care plan. Depending upon the condition, your plan could have included both over the counter or prescription medications.  Please review your pharmacy choice. Make sure the pharmacy is open so you can pick up prescription now. If there is a problem, you may contact your provider through [employer] and have the prescription routed to another pharmacy.  Your safety is important to [place]. If you have drug allergies check your prescription carefully.   For the next 24 hours you can use MyChart to ask questions about today's visit, request a  non-urgent call back, or ask for a work or school excuse. You will get an email in the next two days asking about your experience. I hope that your e-visit has been valuable and will speed your recovery.    have provided 5 minutes of non face to face time during this encounter for chart review and documentation.

## 2024-05-17 ENCOUNTER — Telehealth: Admitting: Physician Assistant

## 2024-05-17 DIAGNOSIS — L089 Local infection of the skin and subcutaneous tissue, unspecified: Secondary | ICD-10-CM

## 2024-05-17 MED ORDER — DOXYCYCLINE HYCLATE 100 MG PO CAPS: 100.0000 mg | ORAL_CAPSULE | Freq: Two times a day (BID) | ORAL | 0 refills | Status: AC

## 2024-05-17 MED ORDER — FLUCONAZOLE 150 MG PO TABS: ORAL_TABLET | ORAL | 0 refills | Status: DC

## 2024-05-17 NOTE — Progress Notes (Signed)
 E-Visit for Skin Condition  We are sorry that you are not feeling well. Here is how we plan to help!  Based on what you shared with me it looks like you have a skin infection.  Cellulitis looks like areas of skin redness, swelling, and warmth; it develops as a result of bacteria entering under the skin. Little red spots and/or bleeding can be seen in skin, and tiny surface sacs containing fluid can occur. Fever can be present. Cellulitis is almost always on one side of a body, and the lower limbs are the most common site of involvement.   It appears you may be developing an abscess, if this is the case, antibiotics alone are unlikely to solve the problem. You will need to be seen in person to have the abscess drained if the antibiotics fail to improve your symptoms within 2 days of being on them.  I have prescribed:  doxycycline  100mg  bid  HOME CARE:  Take your medications as ordered and take all of them, even if the skin irritation appears to be healing.   GET HELP RIGHT AWAY IF:  Symptoms that don't begin to go away within 48 hours. Severe redness persists or worsens If the area turns color, spreads or swells. If it blisters and opens, develops yellow-brown crust or bleeds. You develop a fever or chills. If the pain increases or becomes unbearable.  Are unable to keep fluids and food down.  MAKE SURE YOU   Understand these instructions. Will watch your condition. Will get help right away if you are not doing well or get worse.  Thank you for choosing an e-visit.  Your e-visit answers were reviewed by a board certified advanced clinical practitioner to complete your personal care plan. Depending upon the condition, your plan could have included both over the counter or prescription medications.  Please review your pharmacy choice. Make sure the pharmacy is open so you can pick up prescription now. If there is a problem, you may contact your provider through [employer] and  have the prescription routed to another pharmacy.  Your safety is important to us . If you have drug allergies check your prescription carefully.   For the next 24 hours you can use MyChart to ask questions about today's visit, request a non-urgent call back, or ask for a work or school excuse. You will get an email in the next two days asking about your experience. I hope that your e-visit has been valuable and will speed your recovery.   Approximately 5 minutes was spent documenting and reviewing patient's chart.

## 2024-05-17 NOTE — Addendum Note (Signed)
 Addended by: Aminta Kales on: 05/17/2024 02:45 PM   Modules accepted: Orders

## 2024-05-24 ENCOUNTER — Encounter: Payer: Self-pay | Admitting: Nurse Practitioner

## 2024-05-24 ENCOUNTER — Ambulatory Visit (INDEPENDENT_AMBULATORY_CARE_PROVIDER_SITE_OTHER): Admitting: Nurse Practitioner

## 2024-05-24 VITALS — BP 123/80 | HR 90 | Temp 97.0°F | Ht 66.0 in | Wt 258.0 lb

## 2024-05-24 DIAGNOSIS — L03311 Cellulitis of abdominal wall: Secondary | ICD-10-CM | POA: Insufficient documentation

## 2024-05-24 NOTE — Progress Notes (Signed)
 Acute Office Visit  Subjective:     Patient ID: [name], female    DOB: 1976/11/08, 48 y.o.   MRN: [id number]  Chief Complaint  Patient presents with   skine infection    RLQ- improving per pt    HPI [name] is a 48 year old female who presents on 05/24/2024 for an acute visit due to ongoing concerns related to cellulitis. She was evaluated via e-visit on 05/17/2024 and was started on doxycycline  for 7 days. She reports that today is her last dose. Although the cellulitis site appears to have improved, she notes persistent discharge from the area. She denies any systemic symptoms, including fever, chills, malaise, or lymphadenopathy.   Active Ambulatory Problems    Diagnosis Date Noted   COPD (chronic obstructive pulmonary disease) (HCC) 08/04/2011   Kidney stones 08/04/2011   Anxiety 09/25/2011   Asthma, moderate persistent 11/12/2015   Other fatigue 02/06/2022   Morbid obesity (HCC) 02/06/2022   Depression, major, single episode, moderate (HCC) 02/06/2022   Nausea & vomiting 03/07/2022   Prolonged QT interval 03/07/2022   Cellulitis of abdominal wall 05/24/2024   Resolved Ambulatory Problems    Diagnosis Date Noted   Aspiration of gastric contents 08/04/2011   Acute bronchitis 09/23/2011   Acute exacerbation of COPD with asthma (HCC) 09/24/2011   Leukocytosis 09/25/2011   Encounter for medical examination to establish care 02/06/2022   UTI (urinary tract infection) 03/06/2022   Past Medical History:  Diagnosis Date   Arthritis    Asthma    Depression    Diabetes mellitus without complication (HCC)    GERD (gastroesophageal reflux disease)    History of kidney stones    Hypertension    Pneumonia    PONV (postoperative nausea and vomiting)     Review of Systems  Constitutional:  Negative for chills and fever.  Skin:        Wound on abdomen   Negative unless indicated in HPI    Objective:    BP 123/80   Pulse 90   Temp (!) 97 F (36.1  C)   Ht 5' 6 (1.676 m)   Wt 258 lb (117 kg)   LMP 11/05/2015   PF 98 L/min   BMI 41.64 kg/m  BP Readings from Last 3 Encounters:  05/24/24 123/80  01/28/24 132/83  09/22/23 (!) 141/82   Wt Readings from Last 3 Encounters:  05/24/24 258 lb (117 kg)  04/13/23 (!) 307 lb (139.3 kg)  01/13/23 (!) 303 lb 9.6 oz (137.7 kg)      Physical Exam Vitals and nursing note reviewed.  Constitutional:      General: She is not in acute distress. HENT:     Head: Normocephalic and atraumatic.     Nose: Nose normal.     Mouth/Throat:     Mouth: Mucous membranes are moist.   Eyes:     General: No scleral icterus.    Extraocular Movements: Extraocular movements intact.     Conjunctiva/sclera: Conjunctivae normal.     Pupils: Pupils are equal, round, and reactive to light.    Cardiovascular:     Heart sounds: Normal heart sounds.  Pulmonary:     Effort: Pulmonary effort is normal.     Breath sounds: Normal breath sounds.  Abdominal:     Palpations: Abdomen is soft.     Comments: Skin infection RLQ   Musculoskeletal:        General: Normal range of motion.  Right lower leg: No edema.     Left lower leg: No edema.   Skin:    General: Skin is warm and dry.     Findings: Erythema and lesion present.     Comments: RLQ   Neurological:     Mental Status: She is alert and oriented to person, place, and time.   Psychiatric:        Mood and Affect: Mood normal.        Behavior: Behavior normal.        Thought Content: Thought content normal.        Judgment: Judgment normal.     No results found for any visits on 05/24/24.      Assessment & Plan:  Cellulitis of abdominal wall -     WOUND CULTURE  And is a 48 year old Caucasian female seen today for abdominal cellulitis, no acute distress Finished a course of antibiotic that was prescribed during ED visit Wound culture ordered result pending   The above assessment and management plan was discussed with the patient.  The patient verbalized understanding of and has agreed to the management plan. Patient is aware to call the clinic if they develop any new symptoms or if symptoms persist or worsen. Patient is aware when to return to the clinic for a follow-up visit. Patient educated on when it is appropriate to go to the emergency department.  Return if symptoms worsen or fail to improve.  Sharniece Gibbon [place] Thompson, DNP [facility] [address] [place], [place] [address] [phone]  Note: This document was prepared by [employer] voice dictation technology and any errors that results from this process are unintentional.

## 2024-05-25 LAB — WOUND CULTURE: Organism ID, Bacteria: NONE SEEN

## 2024-05-28 LAB — WOUND CULTURE

## 2024-05-29 ENCOUNTER — Telehealth: Payer: Self-pay

## 2024-05-29 ENCOUNTER — Other Ambulatory Visit: Payer: Self-pay | Admitting: Nurse Practitioner

## 2024-05-29 ENCOUNTER — Ambulatory Visit: Payer: Self-pay | Admitting: Nurse Practitioner

## 2024-05-29 ENCOUNTER — Telehealth: Payer: Self-pay | Admitting: Family Medicine

## 2024-05-29 DIAGNOSIS — L03311 Cellulitis of abdominal wall: Secondary | ICD-10-CM

## 2024-05-29 DIAGNOSIS — N200 Calculus of kidney: Secondary | ICD-10-CM

## 2024-05-29 MED ORDER — AMOXICILLIN-POT CLAVULANATE 875-125 MG PO TABS: 1.0000 | ORAL_TABLET | Freq: Two times a day (BID) | ORAL | 0 refills | Status: DC

## 2024-05-29 NOTE — Telephone Encounter (Signed)
 Dysuria  Patient called with c/o dysuria x 1 week Pain: burning, frequent urination Severity:3/10  Associated Signs and Symptoms:  Fever: noTemp. Chills: no   Please advise.  Call:  [phone]

## 2024-05-29 NOTE — Telephone Encounter (Signed)
 Please advise for patient   Copied from CRM [id number]. Topic: Clinical - Lab/Test Results >> May 29, 2024 11:08 AM Myrick T wrote: Reason for CRM: patient called to get a nurse to explain her results to her. Advised the results were not interpreted by the provider but would call once they were done. Please f/u with patient

## 2024-05-29 NOTE — Telephone Encounter (Unsigned)
 Copied from CRM [id number]. Topic: Clinical - Lab/Test Results >> May 29, 2024  3:25 PM Sophia H wrote: Reason for CRM: Patient is calling in regarding her lab results from her wound culture, states show abnormal but she has not been contacted about these results (states she messaged through mychart this morning with no response). Patient is needing to know if a different antibiotic is going to be sent to the pharmacy for her, please advise. States the wound is starting to look bad again, requesting nurse call back from clinic or NT. # [phone]    [id number] - [place], [place] - [address]

## 2024-05-29 NOTE — Telephone Encounter (Signed)
 This has been addressed in another telephone encounter. Waiting on provider to review results.

## 2024-05-29 NOTE — Telephone Encounter (Signed)
 Patient states urinary frequency and burning started over the weekend. Patient placed on lab scheduled for urine drop off.

## 2024-05-30 ENCOUNTER — Other Ambulatory Visit

## 2024-06-26 ENCOUNTER — Telehealth: Admitting: Physician Assistant

## 2024-06-26 DIAGNOSIS — L03311 Cellulitis of abdominal wall: Secondary | ICD-10-CM

## 2024-06-26 MED ORDER — CEPHALEXIN 500 MG PO CAPS: 500.0000 mg | ORAL_CAPSULE | Freq: Four times a day (QID) | ORAL | 0 refills | Status: AC

## 2024-06-26 NOTE — Progress Notes (Signed)
 E-Visit for Cellulitis  We are sorry that you are not feeling well. Here is how we plan to help!  Based on what you shared with me it looks like you have cellulitis.  Cellulitis looks like areas of skin redness, swelling, and warmth; it develops as a result of bacteria entering under the skin. Little red spots and/or bleeding can be seen in skin, and tiny surface sacs containing fluid can occur. Fever can be present. Cellulitis is almost always on one side of a body, and the lower limbs are the most common site of involvement.   I have prescribed:  Keflex  500mg  take one by mouth four times a day for 5 days  Please make an appointment with your primary care physician for follow up.   HOME CARE:  Take your medications as ordered and take all of them, even if the skin irritation appears to be healing.   GET HELP RIGHT AWAY IF:  Symptoms that don't begin to go away within 48 hours. Severe redness persists or worsens If the area turns color, spreads or swells. If it blisters and opens, develops yellow-brown crust or bleeds. You develop a fever or chills. If the pain increases or becomes unbearable.  Are unable to keep fluids and food down.  MAKE SURE YOU   Understand these instructions. Will watch your condition. Will get help right away if you are not doing well or get worse.  Thank you for choosing an e-visit.  Your e-visit answers were reviewed by a board certified advanced clinical practitioner to complete your personal care plan. Depending upon the condition, your plan could have included both over the counter or prescription medications.  Please review your pharmacy choice. Make sure the pharmacy is open so you can pick up prescription now. If there is a problem, you may contact your provider through [employer] and have the prescription routed to another pharmacy.  Your safety is important to us . If you have drug allergies check your prescription carefully.   For the next  24 hours you can use MyChart to ask questions about today's visit, request a non-urgent call back, or ask for a work or school excuse. You will get an email in the next two days asking about your experience. I hope that your e-visit has been valuable and will speed your recovery.   I have spent 5 minutes in review of e-visit questionnaire, review and updating patient chart, medical decision making and response to patient.   Harlene PEDLAR Ward, PA-C

## 2024-07-04 ENCOUNTER — Telehealth: Admitting: Physician Assistant

## 2024-07-04 DIAGNOSIS — L0390 Cellulitis, unspecified: Secondary | ICD-10-CM

## 2024-07-04 NOTE — Progress Notes (Signed)
  Because this is recurrent and you have been on several antibiotics in the past few week, I feel your condition warrants further evaluation including a possible wound culture and I recommend that you be seen in a face-to-face visit.   NOTE: There will be NO CHARGE for this E-Visit   If you are having a true medical emergency, please call 911.     For an urgent face to face visit, [facility] has multiple urgent care centers for your convenience.  Click the link below for the full list of locations and hours, walk-in wait times, appointment scheduling options and driving directions:  Urgent Care - [place], [place], [place], [place], [place], [place]  [place]     Your MyChart E-visit questionnaire answers were reviewed by a board certified advanced clinical practitioner to complete your personal care plan based on your specific symptoms.    Thank you for using e-Visits.

## 2024-07-25 ENCOUNTER — Other Ambulatory Visit: Payer: Self-pay | Admitting: Urology

## 2024-07-25 DIAGNOSIS — N200 Calculus of kidney: Secondary | ICD-10-CM

## 2024-07-25 NOTE — Telephone Encounter (Signed)
 Needs RX filled for sodium bicarbonate  and potassium citrate  to [facility]

## 2024-07-25 NOTE — Telephone Encounter (Signed)
 Return call to patient. Patient state's that she contact pharmacy and was told both RX was closed out in July of 2025 and she will need a new RX sent to pharmacy. Patient is aware a message will be sent to MD, McKenzie. Patient voiced understanding.

## 2024-07-28 MED ORDER — POTASSIUM CITRATE ER 15 MEQ (1620 MG) PO TBCR: 1.0000 | EXTENDED_RELEASE_TABLET | Freq: Every day | ORAL | 11 refills | Status: DC

## 2024-07-28 MED ORDER — SODIUM BICARBONATE 650 MG PO TABS: 650.0000 mg | ORAL_TABLET | Freq: Two times a day (BID) | ORAL | 11 refills | Status: DC

## 2024-08-02 ENCOUNTER — Ambulatory Visit: Payer: No Typology Code available for payment source | Admitting: Urology

## 2024-08-02 ENCOUNTER — Ambulatory Visit (HOSPITAL_COMMUNITY)
Admission: RE | Admit: 2024-08-02 | Discharge: 2024-08-02 | Disposition: A | Discharge status: Home / Self Care | Source: Ambulatory Visit | Attending: Urology | Admitting: Urology

## 2024-08-02 ENCOUNTER — Ambulatory Visit: Admitting: Urology

## 2024-08-02 VITALS — BP 116/81 | HR 85

## 2024-08-02 DIAGNOSIS — N200 Calculus of kidney: Secondary | ICD-10-CM

## 2024-08-02 LAB — URINALYSIS, ROUTINE W REFLEX MICROSCOPIC
Bilirubin, UA: NEGATIVE
Glucose, UA: NEGATIVE
Ketones, UA: NEGATIVE
Leukocytes,UA: NEGATIVE
Nitrite, UA: NEGATIVE
Protein,UA: NEGATIVE
RBC, UA: NEGATIVE
Specific Gravity, UA: 1.015 (ref 1.005–1.030)
Urobilinogen, Ur: 0.2 mg/dL (ref 0.2–1.0)
pH, UA: 7.5 (ref 5.0–7.5)

## 2024-08-02 MED ORDER — CYCLOBENZAPRINE HCL 5 MG PO TABS
5.0000 mg | ORAL_TABLET | Freq: Three times a day (TID) | ORAL | 2 refills | Status: AC | PRN
Start: 2024-08-02 — End: ?

## 2024-08-02 MED ORDER — INDAPAMIDE 2.5 MG PO TABS: 2.5000 mg | ORAL_TABLET | Freq: Every day | ORAL | 11 refills | Status: AC

## 2024-08-02 MED ORDER — ALLOPURINOL 300 MG PO TABS: 300.0000 mg | ORAL_TABLET | Freq: Every day | ORAL | 11 refills | Status: AC

## 2024-08-02 MED ORDER — SODIUM BICARBONATE 650 MG PO TABS: 650.0000 mg | ORAL_TABLET | Freq: Two times a day (BID) | ORAL | 11 refills | Status: AC

## 2024-08-02 MED ORDER — POTASSIUM CITRATE ER 15 MEQ (1620 MG) PO TBCR: 1.0000 | EXTENDED_RELEASE_TABLET | Freq: Every day | ORAL | 11 refills | Status: AC

## 2024-08-02 NOTE — Progress Notes (Signed)
 08/02/2024 9:06 AM   [name] 01-Oct-1976 [id number]  Referring provider: Lavell Bari LABOR, FNP [address] [place],  [place] [address]  Followup nephrolithiasis   HPI: [name] is a 48yo here fro followup for nephrolithiasis. No stone events since last visit. She has lost 58lbs by decreasing red meat and drinking lemon water . She is taking sodium bicarb and Urocit K.  She is having low back pain which is worse with standing from a sitting position.    PMH: Past Medical History:  Diagnosis Date   Arthritis    Asthma    COPD (chronic obstructive pulmonary disease) (HCC)    sees PCP   Depression    Diabetes mellitus without complication (HCC)    GERD (gastroesophageal reflux disease)    History of kidney stones    Hypertension    pt. denies  never taken meds   Pneumonia    PONV (postoperative nausea and vomiting)     Surgical History: Past Surgical History:  Procedure Laterality Date   ABDOMINAL HYSTERECTOMY     CHOLECYSTECTOMY     CYSTOSCOPY W/ RETROGRADES  08/04/2011   Procedure: CYSTOSCOPY WITH RETROGRADE PYELOGRAM;  Surgeon: Mohammad I Javaid;  Location: [facility];  Service: Urology;  Laterality: Right;  Right Retrograde   CYSTOSCOPY WITH RETROGRADE PYELOGRAM, URETEROSCOPY AND STENT PLACEMENT Left 07/24/2021   Procedure: CYSTOSCOPY WITH RETROGRADE PYELOGRAM, URETEROSCOPY AND STENT PLACEMENT;  Surgeon: Sherrilee Belvie CROME, MD;  Location: [facility];  Service: Urology;  Laterality: Left;   CYSTOSCOPY WITH RETROGRADE PYELOGRAM, URETEROSCOPY AND STENT PLACEMENT Bilateral 03/02/2022   Procedure: CYSTOSCOPY WITH RETROGRADE PYELOGRAM, RIGHT URETEROSCOPY AND BILATERAL STENT PLACEMENT;  Surgeon: Sherrilee Belvie CROME, MD;  Location: [facility];  Service: Urology;  Laterality: Bilateral;   CYSTOSCOPY WITH RETROGRADE PYELOGRAM, URETEROSCOPY AND STENT PLACEMENT Bilateral 03/23/2022   Procedure: CYSTOSCOPY WITH RETROGRADE PYELOGRAM, URETEROSCOPY AND STENT REMOVAL;  Surgeon: Sherrilee Belvie CROME, MD;  Location: [facility];  Service: Urology;  Laterality: Bilateral;   CYSTOSCOPY WITH RETROGRADE PYELOGRAM, URETEROSCOPY AND STENT PLACEMENT Left 11/02/2022   Procedure: CYSTOSCOPY WITH RETROGRADE PYELOGRAM, URETEROSCOPY AND STENT PLACEMENT;  Surgeon: Sherrilee Belvie CROME, MD;  Location: [facility];  Service: Urology;  Laterality: Left;   HOLMIUM LASER APPLICATION N/A 04/15/2018   Procedure: HOLMIUM LASER APPLICATION;  Surgeon: Cam Morene ORN, MD;  Location: [facility];  Service: Urology;  Laterality: N/A;   HOLMIUM LASER APPLICATION Left 07/24/2021   Procedure: HOLMIUM LASER APPLICATION;  Surgeon: Sherrilee Belvie CROME, MD;  Location: [facility];  Service: Urology;  Laterality: Left;   HOLMIUM LASER APPLICATION Bilateral 03/02/2022   Procedure: HOLMIUM LASER APPLICATION;  Surgeon: Sherrilee Belvie CROME, MD;  Location: [facility];  Service: Urology;  Laterality: Bilateral;   HOLMIUM LASER APPLICATION Left 11/02/2022   Procedure: HOLMIUM LASER APPLICATION;  Surgeon: Sherrilee Belvie CROME, MD;  Location: [facility];  Service: Urology;  Laterality: Left;   LITHOTRIPSY     NEPHROLITHOTOMY Right 04/15/2018   Procedure: RIGHT NEPHROLITHOTOMY PERCUTANEOUS WITH ACCESS;  Surgeon: Cam Morene ORN, MD;  Location: [facility];  Service: Urology;  Laterality: Right;   NEPHROLITHOTOMY Right 12/29/2018   Procedure: NEPHROLITHOTOMY PERCUTANEOUS;  Surgeon: Cam Morene ORN, MD;  Location: [facility];  Service: Urology;  Laterality: Right;   PERCUTANEOUS NEPHROLITHOTRIPSY  2012   both sides kidney    STONE EXTRACTION WITH BASKET Bilateral 03/02/2022   Procedure: STONE EXTRACTION WITH BASKET;  Surgeon: Sherrilee Belvie CROME, MD;  Location: [facility];  Service: Urology;  Laterality: Bilateral;  STONE EXTRACTION WITH BASKET Bilateral 03/23/2022   Procedure: STONE EXTRACTION WITH BASKET;  Surgeon: Sherrilee Belvie CROME, MD;  Location: [facility];  Service: Urology;  Laterality: Bilateral;   TUBAL LIGATION      Home Medications:  Allergies as of  08/02/2024       Reactions   Hydrocodone  Nausea And Vomiting   Morphine And Codeine  Hives, Other (See Comments)   Welps   Vicodin [hydrocodone -acetaminophen ] Nausea And Vomiting        Medication List        Accurate as of August 02, 2024  9:06 AM. If you have any questions, ask your nurse or doctor.          allopurinol  300 MG tablet Commonly known as: ZYLOPRIM  Take 1 tablet (300 mg total) by mouth daily.   benzonatate  100 MG capsule Commonly known as: TESSALON  Take 1 capsule (100 mg total) by mouth 3 (three) times daily as needed.   dicyclomine  20 MG tablet Commonly known as: BENTYL  Take 20 mg by mouth 4 (four) times daily as needed for spasms.   erythromycin  ophthalmic ointment Place 1 Application into the left eye at bedtime. Apply a thin ribbon line to the eye lid   fluconazole  150 MG tablet Commonly known as: DIFLUCAN  Take one tablet on the day you fill the prescription. If you continue to have symptoms then take the second tablet in 3 days.   fluticasone  50 MCG/ACT nasal spray Commonly known as: FLONASE  Place 2 sprays into both nostrils daily.   indapamide  2.5 MG tablet Commonly known as: LOZOL  Take 1 tablet (2.5 mg total) by mouth daily.   multivitamin with minerals Tabs tablet Take 1 tablet by mouth daily.   oxyCODONE -acetaminophen  5-325 MG tablet Commonly known as: Percocet Take 1 tablet by mouth every 4 (four) hours as needed.   Potassium Citrate  15 MEQ (1620 MG) Tbcr Commonly known as: Urocit-K  15 Take 1 tablet by mouth daily.   sodium bicarbonate  650 MG tablet Take 1 tablet (650 mg total) by mouth 2 (two) times daily.        Allergies:  Allergies  Allergen Reactions   Hydrocodone  Nausea And Vomiting   Morphine And Codeine  Hives and Other (See Comments)    Welps   Vicodin [Hydrocodone -Acetaminophen ] Nausea And Vomiting    Family History: Family History  Problem Relation Age of Onset   COPD Mother    Cancer Father    COPD  Father    Heart attack Father     Social History:  reports that she quit smoking about 3 years ago. Her smoking use included cigarettes. She started smoking about 21 years ago. She has a 9 pack-year smoking history. She has never used smokeless tobacco. She reports that she does not drink alcohol and does not use drugs.  ROS: All other review of systems were reviewed and are negative except what is noted above in HPI  Physical Exam: BP 116/81   Pulse 85   LMP 11/05/2015   Constitutional:  Alert and oriented, No acute distress. HEENT: [place] AT, moist mucus membranes.  Trachea midline, no masses. Cardiovascular: No clubbing, cyanosis, or edema. Respiratory: Normal respiratory effort, no increased work of breathing. GI: Abdomen is soft, nontender, nondistended, no abdominal masses GU: No CVA tenderness.  Lymph: No cervical or inguinal lymphadenopathy. Skin: No rashes, bruises or suspicious lesions. Neurologic: Grossly intact, no focal deficits, moving all 4 extremities. Psychiatric: Normal mood and affect.  Laboratory Data: Lab Results  Component Value Date  WBC 10.4 03/20/2022   HGB 13.0 03/20/2022   HCT 40.2 03/20/2022   MCV 92.8 03/20/2022   PLT 300 03/20/2022    Lab Results  Component Value Date   CREATININE 0.78 10/28/2022    No results found for: PSA  No results found for: TESTOSTERONE  Lab Results  Component Value Date   HGBA1C 5.9 (A) 04/13/2023    Urinalysis    Component Value Date/Time   COLORURINE AMBER (A) 03/20/2022 0932   APPEARANCEUR Clear 01/28/2024 1239   LABSPEC 1.015 03/20/2022 0932   PHURINE 8.0 03/20/2022 0932   GLUCOSEU Negative 01/28/2024 1239   HGBUR LARGE (A) 03/20/2022 0932   BILIRUBINUR Negative 01/28/2024 1239   KETONESUR NEGATIVE 03/20/2022 0932   PROTEINUR Negative 01/28/2024 1239   PROTEINUR 100 (A) 03/20/2022 0932   UROBILINOGEN 0.2 02/12/2015 1932   NITRITE Negative 01/28/2024 1239   NITRITE NEGATIVE 03/20/2022 0932    LEUKOCYTESUR Trace (A) 01/28/2024 1239   LEUKOCYTESUR MODERATE (A) 03/20/2022 0932    Lab Results  Component Value Date   LABMICR See below: 01/28/2024   WBCUA 6-10 (A) 01/28/2024   LABEPIT 0-10 01/28/2024   MUCUS Present 02/10/2022   BACTERIA None seen 01/28/2024    Pertinent Imaging: KUB today: Images reviewed and discussed with the patient  Results for orders placed in visit on 12/03/23  DG Abd 1 View  Narrative CLINICAL DATA:  Kidney stones.  Left flank pain.  EXAM: ABDOMEN - 1 VIEW  COMPARISON:  Renal ultrasound 01/12/2024, CT 09/16/2022  FINDINGS: No renal calculi are demonstrated by radiograph. No evidence of stones along the course of the ureter or in the pelvis/bladder. Normal bowel gas pattern. Cholecystectomy clips in the right upper quadrant.  IMPRESSION: No renal calculi demonstrated by radiograph.   Electronically Signed By: Andrea Gasman M.D. On: 02/17/2024 23:09  No results found for this or any previous visit.  No results found for this or any previous visit.  No results found for this or any previous visit.  Results for orders placed during the hospital encounter of 01/12/24  Ultrasound renal complete  Narrative : PROCEDURE: US  RENAL  HISTORY: Patient is a 48 y/o F with nephrolithiasis. Chronic renal stone. Six-month follow-up.  COMPARISON: U/S renal 09/21/2023, 06/11/2023.  TECHNIQUE: Two-dimensional grayscale and color Doppler ultrasound of the kidneys was performed.  FINDINGS: The urinary bladder is empty.  The right kidney measures 11.8 x 5.9 x 5.1 cm. Renal cortical echotexture is within normal limits. There is no hydronephrosis. There is a possible stone measuring 0.7 cm at the inferior pole. There are no cysts.  The left kidney measures 12.1 x 6.4 x 5.7 cm. Renal cortical echotexture is within normal limits. There is no hydronephrosis. There is a stone measuring 1.0 cm at the inferior pole. A simple cyst measuring  2.0 cm is identified within the inferior pole.  Fatty liver is incidentally noted.  IMPRESSION: 1. Bilateral non-obstructing renal calculi. Simple left renal cyst.  2.  Diffuse hepatic steatosis.  Thank you for allowing us  to assist in the care of this patient.   Electronically Signed By: Lynwood Mains M.D. On: 01/12/2024 21:22  No results found for this or any previous visit.  No results found for this or any previous visit.  Results for orders placed in visit on 09/08/22  CT RENAL STONE STUDY  Narrative CLINICAL DATA:  Left flank pain.  Nephrolithiasis.  EXAM: CT ABDOMEN AND PELVIS WITHOUT CONTRAST  TECHNIQUE: Multidetector CT imaging of the abdomen and  pelvis was performed following the standard protocol without IV contrast.  RADIATION DOSE REDUCTION: This exam was performed according to the departmental dose-optimization program which includes automated exposure control, adjustment of the mA and/or kV according to patient size and/or use of iterative reconstruction technique.  COMPARISON:  03/20/2022  FINDINGS: Lower chest: No acute findings.  Hepatobiliary: No mass visualized on this unenhanced exam. Moderate diffuse hepatic steatosis again noted. Prior cholecystectomy. No evidence of biliary obstruction.  Pancreas: No mass or inflammatory process visualized on this unenhanced exam.  Spleen:  Within normal limits in size.  Adrenals/Urinary tract: Bilateral ureteral stents have been removed since prior study. A few tiny less than 5 mm renal calculi are seen bilaterally. Moderate left hydronephrosis is seen due to a 7 mm calculus at the left UPJ.  Stomach/Bowel: Stable small hiatal hernia. No evidence of obstruction, inflammatory process, or abnormal fluid collections. Normal appendix visualized. Mild diverticulosis is seen involving the sigmoid colon, however there is no evidence of diverticulitis.  Vascular/Lymphatic: No pathologically enlarged lymph  nodes identified. No evidence of abdominal aortic aneurysm.  Reproductive: Prior hysterectomy noted. Adnexal regions are unremarkable in appearance.  Other:  None.  Musculoskeletal:  No suspicious bone lesions identified.  IMPRESSION: Moderate left hydronephrosis due to 7 mm calculus at the left UPJ.  Bilateral nephrolithiasis.  Stable small hiatal hernia.  Moderate hepatic steatosis.  Mild sigmoid diverticulosis, without radiographic evidence of diverticulitis.   Electronically Signed By: Norleen DELENA Kil M.D. On: 09/17/2022 12:32   Assessment & Plan:    1. Kidney stones (Primary) Continue UrocitK 15meq BID, indapamide , sodium bicarb and allopurinol  - Urinalysis, Routine w reflex microscopic  2. Back pain  -flexeril  5mg  TID prn   No follow-ups on file.  Belvie Clara, MD  [facility] [place]

## 2024-08-08 ENCOUNTER — Encounter: Payer: Self-pay | Admitting: Urology

## 2024-08-08 NOTE — Patient Instructions (Signed)

## 2024-08-25 ENCOUNTER — Telehealth: Admitting: Family Medicine

## 2024-08-25 DIAGNOSIS — J4540 Moderate persistent asthma, uncomplicated: Secondary | ICD-10-CM | POA: Diagnosis not present

## 2024-08-25 MED ORDER — PROMETHAZINE-DM 6.25-15 MG/5ML PO SYRP: 5.0000 mL | ORAL_SOLUTION | Freq: Four times a day (QID) | ORAL | 0 refills | Status: DC | PRN

## 2024-08-25 MED ORDER — PREDNISONE 20 MG PO TABS: 20.0000 mg | ORAL_TABLET | Freq: Two times a day (BID) | ORAL | 0 refills | Status: AC

## 2024-08-25 NOTE — Progress Notes (Signed)
 E-Visit for Cough   We are sorry that you are not feeling well.  Here is how we plan to help!  Based on your presentation I believe you most likely have A cough due to a virus.  This is called viral bronchitis and is best treated by rest, plenty of fluids and control of the cough.  You may use Ibuprofen or Tylenol  as directed to help your symptoms.     In addition you may use promethazine  dm for cough- I have sent to the pharmacy.   I have also sent prednisone .   From your responses in the eVisit questionnaire you describe inflammation in the upper respiratory tract which is causing a significant cough.  This is commonly called Bronchitis and has four common causes:   Allergies Viral Infections Acid Reflux Bacterial Infection Allergies, viruses and acid reflux are treated by controlling symptoms or eliminating the cause. An example might be a cough caused by taking certain blood pressure medications. You stop the cough by changing the medication. Another example might be a cough caused by acid reflux. Controlling the reflux helps control the cough.  USE OF BRONCHODILATOR (RESCUE) INHALERS: There is a risk from using your bronchodilator too frequently.  The risk is that over-reliance on a medication which only relaxes the muscles surrounding the breathing tubes can reduce the effectiveness of medications prescribed to reduce swelling and congestion of the tubes themselves.  Although you feel brief relief from the bronchodilator inhaler, your asthma may actually be worsening with the tubes becoming more swollen and filled with mucus.  This can delay other crucial treatments, such as oral steroid medications. If you need to use a bronchodilator inhaler daily, several times per day, you should discuss this with your provider.  There are probably better treatments that could be used to keep your asthma under control.     HOME CARE Only take medications as instructed by your medical team. Complete  the entire course of an antibiotic. Drink plenty of fluids and get plenty of rest. Avoid close contacts especially the very young and the elderly Cover your mouth if you cough or cough into your sleeve. Always remember to wash your hands A steam or ultrasonic humidifier can help congestion.   GET HELP RIGHT AWAY IF: You develop worsening fever. You become short of breath You cough up blood. Your symptoms persist after you have completed your treatment plan MAKE SURE YOU  Understand these instructions. Will watch your condition. Will get help right away if you are not doing well or get worse.    Thank you for choosing an e-visit.  Your e-visit answers were reviewed by a board certified advanced clinical practitioner to complete your personal care plan. Depending upon the condition, your plan could have included both over the counter or prescription medications.  Please review your pharmacy choice. Make sure the pharmacy is open so you can pick up prescription now. If there is a problem, you may contact your provider through [employer] and have the prescription routed to another pharmacy.  Your safety is important to us . If you have drug allergies check your prescription carefully.   For the next 24 hours you can use MyChart to ask questions about today's visit, request a non-urgent call back, or ask for a work or school excuse. You will get an email in the next two days asking about your experience. I hope that your e-visit has been valuable and will speed your recovery.    have  provided 5 minutes of non face to face time during this encounter for chart review and documentation.

## 2024-09-09 ENCOUNTER — Telehealth

## 2024-09-09 DIAGNOSIS — N61 Mastitis without abscess: Secondary | ICD-10-CM

## 2024-09-10 MED ORDER — SULFAMETHOXAZOLE-TRIMETHOPRIM 800-160 MG PO TABS: 1.0000 | ORAL_TABLET | Freq: Two times a day (BID) | ORAL | 0 refills | Status: DC

## 2024-09-10 NOTE — Progress Notes (Signed)
 E Visit for Cellulitis  We are sorry that you are not feeling well. Here is how we plan to help!  Based on what you shared with me it looks like you have cellulitis.  Cellulitis looks like areas of skin redness, swelling, and warmth; it develops as a result of bacteria entering under the skin. Little red spots and/or bleeding can be seen in skin, and tiny surface sacs containing fluid can occur. Fever can be present. Cellulitis is almost always on one side of a body, and the lower limbs are the most common site of involvement.   I have prescribed:  Bactrim  DS 1 tablet by mouth twice a day for 7 days.  I do recommend calling your PCP Monday and making an appointment this week for close follow up.   HOME CARE:  Take your medications as ordered and take all of them, even if the skin irritation appears to be healing.   GET HELP RIGHT AWAY IF:  Symptoms that don't begin to go away within 48 hours. Severe redness persists or worsens If the area turns color, spreads or swells. If it blisters and opens, develops yellow-brown crust or bleeds. You develop a fever or chills. If the pain increases or becomes unbearable.  Are unable to keep fluids and food down.  MAKE SURE YOU   Understand these instructions. Will watch your condition. Will get help right away if you are not doing well or get worse.  Thank you for choosing an e-visit. Your e-visit answers were reviewed by a board certified advanced clinical practitioner to complete your personal care plan. Depending upon the condition, your plan could have included both over the counter or prescription medications. Please review your pharmacy choice. Make sure the pharmacy is open so you can pick up prescription now. If there is a problem, you may contact your provider through [employer] and have the prescription routed to another pharmacy. Your safety is important to us . If you have drug allergies check your prescription carefully.  For  the next 24 hours you can use MyChart to ask questions about today's visit, request a non-urgent call back, or ask for a work or school excuse. You will get an email in the next two days asking about your experience. I hope that your e-visit has been valuable and will speed your recovery.   I have spent 5 minutes in review of e-visit questionnaire, review and updating patient chart, medical decision making and response to patient.   Bari Learn, FNP

## 2024-09-13 ENCOUNTER — Other Ambulatory Visit: Payer: Self-pay

## 2024-09-13 ENCOUNTER — Emergency Department (HOSPITAL_COMMUNITY)

## 2024-09-13 ENCOUNTER — Emergency Department (HOSPITAL_COMMUNITY): Admission: EM | Admit: 2024-09-13 | Discharge: 2024-09-13 | Disposition: A | Discharge status: Home / Self Care

## 2024-09-13 ENCOUNTER — Encounter (HOSPITAL_COMMUNITY): Payer: Self-pay

## 2024-09-13 DIAGNOSIS — R109 Unspecified abdominal pain: Secondary | ICD-10-CM | POA: Diagnosis present

## 2024-09-13 DIAGNOSIS — N132 Hydronephrosis with renal and ureteral calculous obstruction: Secondary | ICD-10-CM | POA: Diagnosis not present

## 2024-09-13 DIAGNOSIS — N200 Calculus of kidney: Secondary | ICD-10-CM

## 2024-09-13 LAB — CBC WITH DIFFERENTIAL/PLATELET
Abs Immature Granulocytes: 0.03 K/uL (ref 0.00–0.07)
Basophils Absolute: 0 K/uL (ref 0.0–0.1)
Basophils Relative: 0 %
Eosinophils Absolute: 0.1 K/uL (ref 0.0–0.5)
Eosinophils Relative: 1 %
HCT: 43.9 % (ref 36.0–46.0)
Hemoglobin: 15.2 g/dL — ABNORMAL HIGH (ref 12.0–15.0)
Immature Granulocytes: 0 %
Lymphocytes Relative: 34 %
Lymphs Abs: 3.2 K/uL (ref 0.7–4.0)
MCH: 31.7 pg (ref 26.0–34.0)
MCHC: 34.6 g/dL (ref 30.0–36.0)
MCV: 91.6 fL (ref 80.0–100.0)
Monocytes Absolute: 0.6 K/uL (ref 0.1–1.0)
Monocytes Relative: 7 %
Neutro Abs: 5.4 K/uL (ref 1.7–7.7)
Neutrophils Relative %: 58 %
Platelets: 272 K/uL (ref 150–400)
RBC: 4.79 MIL/uL (ref 3.87–5.11)
RDW: 13.2 % (ref 11.5–15.5)
WBC: 9.3 K/uL (ref 4.0–10.5)
nRBC: 0 % (ref 0.0–0.2)

## 2024-09-13 LAB — URINALYSIS, ROUTINE W REFLEX MICROSCOPIC
Bacteria, UA: NONE SEEN
Bilirubin Urine: NEGATIVE
Glucose, UA: NEGATIVE mg/dL
Ketones, ur: NEGATIVE mg/dL
Nitrite: NEGATIVE
Protein, ur: 30 mg/dL — AB
RBC / HPF: >50 RBC/hpf (ref 0–5)
Specific Gravity, Urine: 1.019 (ref 1.005–1.030)
pH: 6 (ref 5.0–8.0)

## 2024-09-13 LAB — COMPREHENSIVE METABOLIC PANEL WITH GFR
ALT: 13 U/L (ref 0–44)
AST: 23 U/L (ref 15–41)
Albumin: 4.2 g/dL (ref 3.5–5.0)
Alkaline Phosphatase: 87 U/L (ref 38–126)
Anion gap: 14 (ref 5–15)
BUN: 14 mg/dL (ref 6–20)
CO2: 20 mmol/L — ABNORMAL LOW (ref 22–32)
Calcium: 8.4 mg/dL — ABNORMAL LOW (ref 8.9–10.3)
Chloride: 102 mmol/L (ref 98–111)
Creatinine, Ser: 1.03 mg/dL — ABNORMAL HIGH (ref 0.44–1.00)
GFR, Estimated: >60 mL/min (ref >60)
Glucose, Bld: 105 mg/dL — ABNORMAL HIGH (ref 70–99)
Potassium: 4 mmol/L (ref 3.5–5.1)
Sodium: 136 mmol/L (ref 135–145)
Total Bilirubin: 0.4 mg/dL (ref 0.0–1.2)
Total Protein: 7.7 g/dL (ref 6.5–8.1)

## 2024-09-13 LAB — LIPASE, BLOOD: Lipase: 25 U/L (ref 11–51)

## 2024-09-13 MED ORDER — ONDANSETRON HCL 4 MG PO TABS: 4.0000 mg | ORAL_TABLET | Freq: Three times a day (TID) | ORAL | 0 refills | Status: AC | PRN

## 2024-09-13 MED ORDER — HYDROMORPHONE HCL 1 MG/ML IJ SOLN
1.0000 mg | Freq: Once | INTRAMUSCULAR | Status: AC
  Administered 2024-09-13: 1 mg via INTRAVENOUS
  Filled 2024-09-13: qty 1

## 2024-09-13 MED ORDER — TAMSULOSIN HCL 0.4 MG PO CAPS: 0.4000 mg | ORAL_CAPSULE | Freq: Every day | ORAL | 0 refills | Status: DC

## 2024-09-13 MED ORDER — ONDANSETRON HCL 4 MG/2ML IJ SOLN
4.0000 mg | Freq: Once | INTRAMUSCULAR | Status: AC
  Administered 2024-09-13: 4 mg via INTRAVENOUS
  Filled 2024-09-13: qty 2

## 2024-09-13 MED ORDER — HYDROMORPHONE HCL 1 MG/ML IJ SOLN
1.0000 mg | Freq: Once | INTRAMUSCULAR | Status: DC
Start: 2024-09-13 — End: 2024-09-13
  Filled 2024-09-13: qty 1

## 2024-09-13 MED ORDER — KETOROLAC TROMETHAMINE 30 MG/ML IJ SOLN
15.0000 mg | Freq: Once | INTRAMUSCULAR | Status: AC
  Administered 2024-09-13: 15 mg via INTRAVENOUS
  Filled 2024-09-13: qty 1

## 2024-09-13 MED ORDER — OXYCODONE-ACETAMINOPHEN 5-325 MG PO TABS: 1.0000 | ORAL_TABLET | Freq: Four times a day (QID) | ORAL | 0 refills | Status: AC | PRN

## 2024-09-13 MED ORDER — OXYCODONE HCL 5 MG PO TABS
5.0000 mg | ORAL_TABLET | Freq: Once | ORAL | Status: AC
  Administered 2024-09-13: 5 mg via ORAL
  Filled 2024-09-13: qty 1

## 2024-09-13 NOTE — ED Notes (Signed)
Pt unable to obtain urine sample at this time 

## 2024-09-13 NOTE — ED Triage Notes (Signed)
 Pt arrived via POV c/o right flank pain that radiates to her groin. Pt endorses N/V, denies hematuria.

## 2024-09-13 NOTE — Discharge Instructions (Signed)
 Take your Zofran  as needed for nausea and vomiting.  Take your Flomax  daily.  Take your Percocet as needed for pain.  Follow-up with your urologist.  Call the office to make an appointment.  Return to the ER for new or worsening symptoms.

## 2024-09-13 NOTE — ED Provider Notes (Signed)
 [place] EMERGENCY DEPARTMENT AT [facility] Provider Note   CSN: [id number] Arrival date & time: 09/13/24  9047     Patient presents with: Flank Pain   [name] is a 48 y.o. female.   48 year old female presents for evaluation of right flank pain.  States this started last night and has migrated down to her right lower quadrant.  She states she has had kidney stones before.  States she has had some nausea and vomiting as well but denies any dysuria or diarrhea.  Denies any other symptoms or concerns at this time.   Flank Pain Pertinent negatives include no chest pain, no abdominal pain and no shortness of breath.       Prior to Admission medications   Medication Sig Start Date End Date Taking? Authorizing Provider  ondansetron  (ZOFRAN ) 4 MG tablet Take 1 tablet (4 mg total) by mouth every 8 (eight) hours as needed for up to 4 days. 09/13/24 09/17/24 Yes Xitlalli Newhard L, DO  oxyCODONE -acetaminophen  (PERCOCET/ROXICET) 5-325 MG tablet Take 1 tablet by mouth every 6 (six) hours as needed for up to 4 days for severe pain (pain score 7-10). 09/13/24 09/17/24 Yes Pollyanna Levay L, DO  tamsulosin  (FLOMAX ) 0.4 MG CAPS capsule Take 1 capsule (0.4 mg total) by mouth daily for 14 days. 09/13/24 09/27/24 Yes Brittin Janik L, DO  allopurinol  (ZYLOPRIM ) 300 MG tablet Take 1 tablet (300 mg total) by mouth daily. 08/02/24   McKenzie, Belvie CROME, MD  cyclobenzaprine  (FLEXERIL ) 5 MG tablet Take 1 tablet (5 mg total) by mouth 3 (three) times daily as needed for muscle spasms. 08/02/24   McKenzie, Belvie CROME, MD  dicyclomine  (BENTYL ) 20 MG tablet Take 20 mg by mouth 4 (four) times daily as needed for spasms. 10/08/22 05/24/24  [provider]  fluticasone  (FLONASE ) 50 MCG/ACT nasal spray Place 2 sprays into both nostrils daily. 11/26/23   Blair, Diane W, FNP  indapamide  (LOZOL ) 2.5 MG tablet Take 1 tablet (2.5 mg total) by mouth daily. 08/02/24   McKenzie, Belvie CROME, MD   Multiple Vitamin (MULTIVITAMIN WITH MINERALS) TABS tablet Take 1 tablet by mouth daily.    [provider]  Potassium Citrate  (UROCIT-K  15) 15 MEQ (1620 MG) TBCR Take 1 tablet by mouth daily. 08/02/24   McKenzie, Belvie CROME, MD  sodium bicarbonate  650 MG tablet Take 1 tablet (650 mg total) by mouth 2 (two) times daily. 08/02/24   McKenzie, Belvie CROME, MD  sulfamethoxazole -trimethoprim  (BACTRIM  DS) 800-160 MG tablet Take 1 tablet by mouth 2 (two) times daily. 09/10/24   Lavell Lye A, FNP    Allergies: Hydrocodone , Morphine and codeine , and Vicodin [hydrocodone -acetaminophen ]    Review of Systems  Constitutional:  Negative for chills and fever.  HENT:  Negative for ear pain and sore throat.   Eyes:  Negative for pain and visual disturbance.  Respiratory:  Negative for cough and shortness of breath.   Cardiovascular:  Negative for chest pain and palpitations.  Gastrointestinal:  Positive for nausea and vomiting. Negative for abdominal pain.  Genitourinary:  Positive for flank pain. Negative for dysuria and hematuria.  Musculoskeletal:  Negative for arthralgias and back pain.  Skin:  Negative for color change and rash.  Neurological:  Negative for seizures and syncope.  All other systems reviewed and are negative.   Updated Vital Signs BP 123/84   Pulse (!) 58   Temp 97.6 F (36.4 C) (Oral)   Resp 20   Ht 5' 6 (1.676 m)  Wt 117 kg   LMP 11/05/2015   SpO2 95%   BMI 41.63 kg/m   Physical Exam Vitals and nursing note reviewed.  Constitutional:      General: She is not in acute distress.    Appearance: Normal appearance. She is well-developed.     Comments: Appears very uncomfortable  HENT:     Head: Normocephalic and atraumatic.  Eyes:     Conjunctiva/sclera: Conjunctivae normal.  Cardiovascular:     Rate and Rhythm: Normal rate and regular rhythm.     Heart sounds: No murmur heard. Pulmonary:     Effort: Pulmonary effort is normal. No respiratory distress.      Breath sounds: Normal breath sounds.  Abdominal:     Palpations: Abdomen is soft.     Tenderness: There is abdominal tenderness. There is right CVA tenderness.     Comments: Right lower quadrant tenderness to palpation  Musculoskeletal:        General: No swelling.     Cervical back: Neck supple.  Skin:    General: Skin is warm and dry.     Capillary Refill: Capillary refill takes less than 2 seconds.  Neurological:     Mental Status: She is alert.  Psychiatric:        Mood and Affect: Mood normal.     (all labs ordered are listed, but only abnormal results are displayed) Labs Reviewed  COMPREHENSIVE METABOLIC PANEL WITH GFR - Abnormal; Notable for the following components:      Result Value   CO2 20 (*)    Glucose, Bld 105 (*)    Creatinine, Ser 1.03 (*)    Calcium  8.4 (*)    All other components within normal limits  URINALYSIS, ROUTINE W REFLEX MICROSCOPIC - Abnormal; Notable for the following components:   APPearance CLOUDY (*)    Hgb urine dipstick MODERATE (*)    Protein, ur 30 (*)    Leukocytes,Ua TRACE (*)    All other components within normal limits  CBC WITH DIFFERENTIAL/PLATELET - Abnormal; Notable for the following components:   Hemoglobin 15.2 (*)    All other components within normal limits  LIPASE, BLOOD    EKG: EKG Interpretation Date/Time:  Wednesday September 13 2024 11:08:39 EDT Ventricular Rate:  70 PR Interval:  148 QRS Duration:  108 QT Interval:  428 QTC Calculation: 462 R Axis:   82  Text Interpretation: Sinus rhythm Low voltage, precordial leads Compared with prior EKG from 03/06/2022 Confirmed by Gennaro Bouchard ([id number]) on 09/13/2024 11:54:06 AM  Radiology: CT ABDOMEN PELVIS WO CONTRAST Result Date: 09/13/2024 EXAM: CT ABDOMEN AND PELVIS WITHOUT CONTRAST 09/13/2024 11:51:05 AM TECHNIQUE: CT of the abdomen and pelvis was performed without the administration of intravenous contrast. Multiplanar reformatted images are provided for review.  Automated exposure control, iterative reconstruction, and/or weight-based adjustment of the mA/kV was utilized to reduce the radiation dose to as low as reasonably achievable. COMPARISON: 09/16/2022 CLINICAL HISTORY: right flank pain, rlq pain. Pt arrived via POV c/o right flank pain that radiates to her groin. Pt endorses N/V, denies hematuria. FINDINGS: LOWER CHEST: Small hiatal hernia. LIVER: The liver is unremarkable. GALLBLADDER AND BILE DUCTS: Status post cholecystectomy. No biliary ductal dilatation. SPLEEN: No acute abnormality. PANCREAS: No acute abnormality. ADRENAL GLANDS: No acute abnormality. KIDNEYS, URETERS AND BLADDER: Bilateral nephrolithiasis. Moderate right hydroureteronephrosis is noted secondary to 2 distal right ureteral calculi, the largest measuring 4 mm. No perinephric or periureteral stranding. Urinary bladder is unremarkable. GI AND BOWEL: Stomach demonstrates  no acute abnormality. There is no bowel obstruction. PERITONEUM AND RETROPERITONEUM: No ascites. No free air. VASCULATURE: Aorta is normal in caliber. LYMPH NODES: No lymphadenopathy. REPRODUCTIVE ORGANS: No acute abnormality. BONES AND SOFT TISSUES: No acute osseous abnormality. No focal soft tissue abnormality. IMPRESSION: 1. Moderate right hydroureteronephrosis due to two distal right ureteral calculi, largest 4 mm. 2. Bilateral nephrolithiasis. Electronically signed by: Lynwood Seip MD 09/13/2024 12:11 PM EDT RP Workstation: [id number]     Procedures   Medications Ordered in the ED  HYDROmorphone  (DILAUDID ) injection 1 mg (1 mg Intravenous Given 09/13/24 1101)  ondansetron  (ZOFRAN ) injection 4 mg (4 mg Intravenous Given 09/13/24 1101)  ketorolac  (TORADOL ) 30 MG/ML injection 15 mg (15 mg Intravenous Given 09/13/24 1101)  oxyCODONE  (Oxy IR/ROXICODONE ) immediate release tablet 5 mg (5 mg Oral Given 09/13/24 1239)  ondansetron  (ZOFRAN ) injection 4 mg (4 mg Intravenous Given 09/13/24 1245)                                     Medical Decision Making Cardiac monitor interpretation: Sinus bradycardia, no ectopy  Patient here for flank pain.  Found to have 2 right sided stones in her right ureter, they are less than 4 mm each and not causing any obstruction.  Was given IV pain medication Toradol  fluids and Zofran .  Lab workup fairly unremarkable.  She will plan to follow-up with her urologist who she has seen for this in the past.  Will give her prescription for Zofran , some pain medication and Flomax .  Advised to return for new or worsening symptoms.  Feels comfortable being discharged home.  Problems Addressed: Kidney stone: acute illness or injury  Amount and/or Complexity of Data Reviewed External Data Reviewed: notes.    Details: Prior outpatient records reviewed and patient last seen by urology for kidney stones in 08-02-2024 Labs: ordered. Decision-making details documented in ED Course.    Details: Ordered and reviewed by me and slight leukocytosis but otherwise fairly unremarkable lab work Radiology: ordered and independent interpretation performed. Decision-making details documented in ED Course.    Details: Ordered and reviewed by me CT abdomen pelvis: Shows evidence of 2 right-sided kidney stones ECG/medicine tests: ordered and independent interpretation performed. Decision-making details documented in ED Course.    Details: Ordered and inter by me in the absence of cardiology and shows sinus rhythm, no STEMI  Risk OTC drugs. Prescription drug management. Parenteral controlled substances. Drug therapy requiring intensive monitoring for toxicity.     Final diagnoses:  Kidney stone    ED Discharge Orders          Ordered    ondansetron  (ZOFRAN ) 4 MG tablet  Every 8 hours PRN        09/13/24 1320    tamsulosin  (FLOMAX ) 0.4 MG CAPS capsule  Daily        09/13/24 1320    oxyCODONE -acetaminophen  (PERCOCET/ROXICET) 5-325 MG tablet  Every 6 hours PRN        09/13/24 1320                Samia Kukla L, DO 09/13/24 1530

## 2024-09-18 ENCOUNTER — Telehealth: Payer: Self-pay | Admitting: Urology

## 2024-09-18 NOTE — Telephone Encounter (Signed)
 Return call to pt. Pt state's she went to the ER due to pain with kidney stone and she is not doing to well. Pt would like her blood work from the hospital review being that the hospital did not go over the blood work with her and she review on mychart and has some concerns. Pt is aware a message will be sent to MD to review labs and imaging. Voiced understanding

## 2024-09-18 NOTE — Telephone Encounter (Signed)
 Patient went to ER with kidney stones. Has been in bed and needs to be seen asap

## 2024-09-19 ENCOUNTER — Other Ambulatory Visit: Payer: Self-pay

## 2024-09-19 ENCOUNTER — Other Ambulatory Visit: Payer: Self-pay | Admitting: Urology

## 2024-09-19 DIAGNOSIS — N200 Calculus of kidney: Secondary | ICD-10-CM

## 2024-09-19 MED ORDER — OXYCODONE-ACETAMINOPHEN 10-325 MG PO TABS: 1.0000 | ORAL_TABLET | ORAL | 0 refills | Status: DC | PRN

## 2024-09-19 MED ORDER — TAMSULOSIN HCL 0.4 MG PO CAPS: 0.4000 mg | ORAL_CAPSULE | Freq: Every day | ORAL | 0 refills | Status: AC

## 2024-09-19 NOTE — Telephone Encounter (Signed)
 She has 2 ureteral calculi. Please send in flomax  0.4mg  daily and ill see her in 2 weeks with a KUB

## 2024-10-05 ENCOUNTER — Ambulatory Visit (HOSPITAL_COMMUNITY)
Admission: RE | Admit: 2024-10-05 | Discharge: 2024-10-05 | Disposition: A | Discharge status: Home / Self Care | Source: Ambulatory Visit | Attending: Urology | Admitting: Urology

## 2024-10-05 DIAGNOSIS — N200 Calculus of kidney: Secondary | ICD-10-CM | POA: Insufficient documentation

## 2024-10-06 ENCOUNTER — Encounter: Payer: Self-pay | Admitting: Urology

## 2024-10-06 ENCOUNTER — Ambulatory Visit (INDEPENDENT_AMBULATORY_CARE_PROVIDER_SITE_OTHER): Admitting: Urology

## 2024-10-06 VITALS — BP 135/85 | HR 74

## 2024-10-06 DIAGNOSIS — N201 Calculus of ureter: Secondary | ICD-10-CM | POA: Diagnosis not present

## 2024-10-06 DIAGNOSIS — N200 Calculus of kidney: Secondary | ICD-10-CM

## 2024-10-06 LAB — URINALYSIS, ROUTINE W REFLEX MICROSCOPIC
Bilirubin, UA: NEGATIVE
Glucose, UA: NEGATIVE
Ketones, UA: NEGATIVE
Nitrite, UA: NEGATIVE
Protein,UA: NEGATIVE
RBC, UA: NEGATIVE
Specific Gravity, UA: 1.015 (ref 1.005–1.030)
Urobilinogen, Ur: 0.2 mg/dL (ref 0.2–1.0)
pH, UA: 6.5 (ref 5.0–7.5)

## 2024-10-06 LAB — MICROSCOPIC EXAMINATION
Bacteria, UA: NONE SEEN
Epithelial Cells (non renal): >10 /HPF — AB (ref 0–10)

## 2024-10-06 MED ORDER — OXYCODONE-ACETAMINOPHEN 10-325 MG PO TABS: 1.0000 | ORAL_TABLET | ORAL | 0 refills | Status: AC | PRN

## 2024-10-06 MED ORDER — TAMSULOSIN HCL 0.4 MG PO CAPS: 0.4000 mg | ORAL_CAPSULE | Freq: Every day | ORAL | 0 refills | Status: AC

## 2024-10-06 MED ORDER — TAMSULOSIN HCL 0.4 MG PO CAPS: 0.4000 mg | ORAL_CAPSULE | Freq: Every day | ORAL | 1 refills | Status: DC

## 2024-10-06 NOTE — Progress Notes (Signed)
 10/06/2024 8:44 AM   [name] Weick Jan 20, 1976 [id number]  Referring provider: Lavell Bari LABOR, FNP [address] [place],  [place] [address]  Followup nephrolithiasis   HPI: [name] is a 48yo here for followup for nephrolithiasis. She had a stone event 09/13/24 and underwent CT which showed 2 mid ureteral calculi. She has not passed her stones. KUB from today shows right mid ureteral calculi   PMH: Past Medical History:  Diagnosis Date   Arthritis    Asthma    COPD (chronic obstructive pulmonary disease) (HCC)    sees PCP   Depression    Diabetes mellitus without complication (HCC)    GERD (gastroesophageal reflux disease)    History of kidney stones    Hypertension    pt. denies  never taken meds   Pneumonia    PONV (postoperative nausea and vomiting)     Surgical History: Past Surgical History:  Procedure Laterality Date   ABDOMINAL HYSTERECTOMY     CHOLECYSTECTOMY     CYSTOSCOPY W/ RETROGRADES  08/04/2011   Procedure: CYSTOSCOPY WITH RETROGRADE PYELOGRAM;  Surgeon: Mohammad I Javaid;  Location: [facility];  Service: Urology;  Laterality: Right;  Right Retrograde   CYSTOSCOPY WITH RETROGRADE PYELOGRAM, URETEROSCOPY AND STENT PLACEMENT Left 07/24/2021   Procedure: CYSTOSCOPY WITH RETROGRADE PYELOGRAM, URETEROSCOPY AND STENT PLACEMENT;  Surgeon: Sherrilee Belvie CROME, MD;  Location: [facility];  Service: Urology;  Laterality: Left;   CYSTOSCOPY WITH RETROGRADE PYELOGRAM, URETEROSCOPY AND STENT PLACEMENT Bilateral 03/02/2022   Procedure: CYSTOSCOPY WITH RETROGRADE PYELOGRAM, RIGHT URETEROSCOPY AND BILATERAL STENT PLACEMENT;  Surgeon: Sherrilee Belvie CROME, MD;  Location: [facility];  Service: Urology;  Laterality: Bilateral;   CYSTOSCOPY WITH RETROGRADE PYELOGRAM, URETEROSCOPY AND STENT PLACEMENT Bilateral 03/23/2022   Procedure: CYSTOSCOPY WITH RETROGRADE PYELOGRAM, URETEROSCOPY AND STENT REMOVAL;  Surgeon: Sherrilee Belvie CROME, MD;  Location: [facility];  Service: Urology;   Laterality: Bilateral;   CYSTOSCOPY WITH RETROGRADE PYELOGRAM, URETEROSCOPY AND STENT PLACEMENT Left 11/02/2022   Procedure: CYSTOSCOPY WITH RETROGRADE PYELOGRAM, URETEROSCOPY AND STENT PLACEMENT;  Surgeon: Sherrilee Belvie CROME, MD;  Location: [facility];  Service: Urology;  Laterality: Left;   HOLMIUM LASER APPLICATION N/A 04/15/2018   Procedure: HOLMIUM LASER APPLICATION;  Surgeon: Cam Morene ORN, MD;  Location: [facility];  Service: Urology;  Laterality: N/A;   HOLMIUM LASER APPLICATION Left 07/24/2021   Procedure: HOLMIUM LASER APPLICATION;  Surgeon: Sherrilee Belvie CROME, MD;  Location: [facility];  Service: Urology;  Laterality: Left;   HOLMIUM LASER APPLICATION Bilateral 03/02/2022   Procedure: HOLMIUM LASER APPLICATION;  Surgeon: Sherrilee Belvie CROME, MD;  Location: [facility];  Service: Urology;  Laterality: Bilateral;   HOLMIUM LASER APPLICATION Left 11/02/2022   Procedure: HOLMIUM LASER APPLICATION;  Surgeon: Sherrilee Belvie CROME, MD;  Location: [facility];  Service: Urology;  Laterality: Left;   LITHOTRIPSY     NEPHROLITHOTOMY Right 04/15/2018   Procedure: RIGHT NEPHROLITHOTOMY PERCUTANEOUS WITH ACCESS;  Surgeon: Cam Morene ORN, MD;  Location: [facility];  Service: Urology;  Laterality: Right;   NEPHROLITHOTOMY Right 12/29/2018   Procedure: NEPHROLITHOTOMY PERCUTANEOUS;  Surgeon: Cam Morene ORN, MD;  Location: [facility];  Service: Urology;  Laterality: Right;   PERCUTANEOUS NEPHROLITHOTRIPSY  2012   both sides kidney    STONE EXTRACTION WITH BASKET Bilateral 03/02/2022   Procedure: STONE EXTRACTION WITH BASKET;  Surgeon: Sherrilee Belvie CROME, MD;  Location: [facility];  Service: Urology;  Laterality: Bilateral;   STONE EXTRACTION WITH BASKET Bilateral 03/23/2022   Procedure: STONE EXTRACTION WITH BASKET;  Surgeon: Sherrilee Belvie CROME, MD;  Location: [facility];  Service: Urology;  Laterality: Bilateral;   TUBAL LIGATION      Home Medications:  Allergies as of 10/06/2024       Reactions   Hydrocodone  Nausea And  Vomiting   Morphine And Codeine  Hives, Other (See Comments)   Welps   Vicodin [hydrocodone -acetaminophen ] Nausea And Vomiting        Medication List        Accurate as of October 06, 2024  8:44 AM. If you have any questions, ask your nurse or doctor.          allopurinol  300 MG tablet Commonly known as: ZYLOPRIM  Take 1 tablet (300 mg total) by mouth daily.   cyclobenzaprine  5 MG tablet Commonly known as: FLEXERIL  Take 1 tablet (5 mg total) by mouth 3 (three) times daily as needed for muscle spasms.   dicyclomine  20 MG tablet Commonly known as: BENTYL  Take 20 mg by mouth 4 (four) times daily as needed for spasms.   fluticasone  50 MCG/ACT nasal spray Commonly known as: FLONASE  Place 2 sprays into both nostrils daily.   indapamide  2.5 MG tablet Commonly known as: LOZOL  Take 1 tablet (2.5 mg total) by mouth daily.   multivitamin with minerals Tabs tablet Take 1 tablet by mouth daily.   oxyCODONE -acetaminophen  10-325 MG tablet Commonly known as: Percocet Take 1 tablet by mouth every 4 (four) hours as needed for pain.   Potassium Citrate  15 MEQ (1620 MG) Tbcr Commonly known as: Urocit-K  15 Take 1 tablet by mouth daily.   sodium bicarbonate  650 MG tablet Take 1 tablet (650 mg total) by mouth 2 (two) times daily.   sulfamethoxazole -trimethoprim  800-160 MG tablet Commonly known as: Bactrim  DS Take 1 tablet by mouth 2 (two) times daily.        Allergies:  Allergies  Allergen Reactions   Hydrocodone  Nausea And Vomiting   Morphine And Codeine  Hives and Other (See Comments)    Welps   Vicodin [Hydrocodone -Acetaminophen ] Nausea And Vomiting    Family History: Family History  Problem Relation Age of Onset   COPD Mother    Cancer Father    COPD Father    Heart attack Father     Social History:  reports that she quit smoking about 3 years ago. Her smoking use included cigarettes. She started smoking about 21 years ago. She has a 9 pack-year smoking  history. She has never used smokeless tobacco. She reports that she does not drink alcohol and does not use drugs.  ROS: All other review of systems were reviewed and are negative except what is noted above in HPI  Physical Exam: BP 135/85   Pulse 74   LMP 11/05/2015   Constitutional:  Alert and oriented, No acute distress. HEENT: [place] AT, moist mucus membranes.  Trachea midline, no masses. Cardiovascular: No clubbing, cyanosis, or edema. Respiratory: Normal respiratory effort, no increased work of breathing. GI: Abdomen is soft, nontender, nondistended, no abdominal masses GU: No CVA tenderness.  Lymph: No cervical or inguinal lymphadenopathy. Skin: No rashes, bruises or suspicious lesions. Neurologic: Grossly intact, no focal deficits, moving all 4 extremities. Psychiatric: Normal mood and affect.  Laboratory Data: Lab Results  Component Value Date   WBC 9.3 09/13/2024   HGB 15.2 (H) 09/13/2024   HCT 43.9 09/13/2024   MCV 91.6 09/13/2024   PLT 272 09/13/2024    Lab Results  Component Value Date   CREATININE 1.03 (H) 09/13/2024    No results  found for: PSA  No results found for: TESTOSTERONE  Lab Results  Component Value Date   HGBA1C 5.9 (A) 04/13/2023    Urinalysis    Component Value Date/Time   COLORURINE YELLOW 09/13/2024 1246   APPEARANCEUR CLOUDY (A) 09/13/2024 1246   APPEARANCEUR Clear 08/02/2024 0843   LABSPEC 1.019 09/13/2024 1246   PHURINE 6.0 09/13/2024 1246   GLUCOSEU NEGATIVE 09/13/2024 1246   HGBUR MODERATE (A) 09/13/2024 1246   BILIRUBINUR NEGATIVE 09/13/2024 1246   BILIRUBINUR Negative 08/02/2024 0843   KETONESUR NEGATIVE 09/13/2024 1246   PROTEINUR 30 (A) 09/13/2024 1246   UROBILINOGEN 0.2 02/12/2015 1932   NITRITE NEGATIVE 09/13/2024 1246   LEUKOCYTESUR TRACE (A) 09/13/2024 1246    Lab Results  Component Value Date   LABMICR Comment 08/02/2024   WBCUA 6-10 (A) 01/28/2024   LABEPIT 0-10 01/28/2024   MUCUS Present 02/10/2022    BACTERIA NONE SEEN 09/13/2024    Pertinent Imaging: KUb today: Images reviewed and discussed with the patient  Results for orders placed during the hospital encounter of 08/02/24  DG Abd 1 View  Narrative CLINICAL DATA:  kidney stone  EXAM: ABDOMEN - 1 VIEW  COMPARISON:  February third 2025 September 16, 2022  FINDINGS: Air and stool filled nondilated loops of bowel. Incomplete visualization of the lateral abdominal soft tissues. Hazy calcific density projecting over the expected superior pole of the RIGHT kidney spanning 7 mm, likely nonobstructing nephrolithiasis versus medullary nephrocalcinosis. Status post cholecystectomy. Pelvic phleboliths.  IMPRESSION: Hazy calcific density projecting over the expected superior pole of the RIGHT kidney spanning 7 mm, likely nonobstructing nephrolithiasis versus medullary nephrocalcinosis.  If there is a persistent clinical concern for obstructing nephrolithiasis, dedicated CT abdomen pelvis would be recommended.   Electronically Signed By: Corean Salter M.D. On: 08/10/2024 08:01  No results found for this or any previous visit.  No results found for this or any previous visit.  No results found for this or any previous visit.  Results for orders placed during the hospital encounter of 01/12/24  Ultrasound renal complete  Narrative : PROCEDURE: US  RENAL  HISTORY: Patient is a 48 y/o F with nephrolithiasis. Chronic renal stone. Six-month follow-up.  COMPARISON: U/S renal 09/21/2023, 06/11/2023.  TECHNIQUE: Two-dimensional grayscale and color Doppler ultrasound of the kidneys was performed.  FINDINGS: The urinary bladder is empty.  The right kidney measures 11.8 x 5.9 x 5.1 cm. Renal cortical echotexture is within normal limits. There is no hydronephrosis. There is a possible stone measuring 0.7 cm at the inferior pole. There are no cysts.  The left kidney measures 12.1 x 6.4 x 5.7 cm. Renal  cortical echotexture is within normal limits. There is no hydronephrosis. There is a stone measuring 1.0 cm at the inferior pole. A simple cyst measuring 2.0 cm is identified within the inferior pole.  Fatty liver is incidentally noted.  IMPRESSION: 1. Bilateral non-obstructing renal calculi. Simple left renal cyst.  2.  Diffuse hepatic steatosis.  Thank you for allowing us  to assist in the care of this patient.   Electronically Signed By: Lynwood Mains M.D. On: 01/12/2024 21:22  No results found for this or any previous visit.  No results found for this or any previous visit.  Results for orders placed in visit on 09/08/22  CT RENAL STONE STUDY  Narrative CLINICAL DATA:  Left flank pain.  Nephrolithiasis.  EXAM: CT ABDOMEN AND PELVIS WITHOUT CONTRAST  TECHNIQUE: Multidetector CT imaging of the abdomen and pelvis was performed following the standard  protocol without IV contrast.  RADIATION DOSE REDUCTION: This exam was performed according to the departmental dose-optimization program which includes automated exposure control, adjustment of the mA and/or kV according to patient size and/or use of iterative reconstruction technique.  COMPARISON:  03/20/2022  FINDINGS: Lower chest: No acute findings.  Hepatobiliary: No mass visualized on this unenhanced exam. Moderate diffuse hepatic steatosis again noted. Prior cholecystectomy. No evidence of biliary obstruction.  Pancreas: No mass or inflammatory process visualized on this unenhanced exam.  Spleen:  Within normal limits in size.  Adrenals/Urinary tract: Bilateral ureteral stents have been removed since prior study. A few tiny less than 5 mm renal calculi are seen bilaterally. Moderate left hydronephrosis is seen due to a 7 mm calculus at the left UPJ.  Stomach/Bowel: Stable small hiatal hernia. No evidence of obstruction, inflammatory process, or abnormal fluid collections. Normal appendix visualized. Mild  diverticulosis is seen involving the sigmoid colon, however there is no evidence of diverticulitis.  Vascular/Lymphatic: No pathologically enlarged lymph nodes identified. No evidence of abdominal aortic aneurysm.  Reproductive: Prior hysterectomy noted. Adnexal regions are unremarkable in appearance.  Other:  None.  Musculoskeletal:  No suspicious bone lesions identified.  IMPRESSION: Moderate left hydronephrosis due to 7 mm calculus at the left UPJ.  Bilateral nephrolithiasis.  Stable small hiatal hernia.  Moderate hepatic steatosis.  Mild sigmoid diverticulosis, without radiographic evidence of diverticulitis.   Electronically Signed By: Norleen DELENA Kil M.D. On: 09/17/2022 12:32   Assessment & Plan:    1. Kidney stone (Primary) -We discussed the management of kidney stones. These options include observation, ureteroscopy, shockwave lithotripsy (ESWL) and percutaneous nephrolithotomy (PCNL). We discussed which options are relevant to the patient's stone(s). We discussed the natural history of kidney stones as well as the complications of untreated stones and the impact on quality of life without treatment as well as with each of the above listed treatments. We also discussed the efficacy of each treatment in its ability to clear the stone burden. With any of these management options I discussed the signs and symptoms of infection and the need for emergent treatment should these be experienced. For each option we discussed the ability of each procedure to clear the patient of their stone burden.   For observation I described the risks which include but are not limited to silent renal damage, life-threatening infection, need for emergent surgery, failure to pass stone and pain.   For ureteroscopy I described the risks which include bleeding, infection, damage to contiguous structures, positioning injury, ureteral stricture, ureteral avulsion, ureteral injury, need for prolonged  ureteral stent, inability to perform ureteroscopy, need for an interval procedure, inability to clear stone burden, stent discomfort/pain, heart attack, stroke, pulmonary embolus and the inherent risks with general anesthesia.   For shockwave lithotripsy I described the risks which include arrhythmia, kidney contusion, kidney hemorrhage, need for transfusion, pain, inability to adequately break up stone, inability to pass stone fragments, Steinstrasse, infection associated with obstructing stones, need for alternate surgical procedure, need for repeat shockwave lithotripsy, MI, CVA, PE and the inherent risks with anesthesia/conscious sedation.   For PCNL I described the risks including positioning injury, pneumothorax, hydrothorax, need for chest tube, inability to clear stone burden, renal laceration, arterial venous fistula or malformation, need for embolization of kidney, loss of kidney or renal function, need for repeat procedure, need for prolonged nephrostomy tube, ureteral avulsion, MI, CVA, PE and the inherent risks of general anesthesia.   - The patient would like to proceed with  medical expulsive therapy - Urinalysis, Routine w reflex microscopic   No follow-ups on file.  Belvie Clara, MD  [facility] [place]

## 2024-10-06 NOTE — Patient Instructions (Signed)

## 2024-10-23 ENCOUNTER — Telehealth: Admitting: Physician Assistant

## 2024-10-23 DIAGNOSIS — L739 Follicular disorder, unspecified: Secondary | ICD-10-CM

## 2024-10-23 MED ORDER — DOXYCYCLINE HYCLATE 100 MG PO TABS: 100.0000 mg | ORAL_TABLET | Freq: Two times a day (BID) | ORAL | 0 refills | Status: AC

## 2024-10-23 NOTE — Progress Notes (Signed)
  E Visit for Folliculitis   We are sorry you are not feeling well.  Here is how we plan to help!  Based on what you have shared with me it looks like you have folliculitis.  Folliculitis refers to inflammation of the superficial or deep portio of the hair follicle.  It can be infectious or non-infectious. Various bacteria, fungi, viruses, and parasites can cause infectious folliculis  Doxycycline  100 mg twice per day for 7 days     HOME CARE: Apply a warm, moist washcloth or compress using a saltwater solution (1 teaspoon of table salt to 2 cups water) Apply over the counter antibiotic cream, gel or wash  Apply soothing lotions such as oatmeal lotion or over the counter hydrocortisone cream Clean the affected skin twice daily with antibacterial soap. Use clean washcloth and towel each time and do not share with anyone.  Wash these items and clothes that have touched the area with hot soapy water. Protect the skin. If possible avoid shaving.  If you must shave, try an Neurosurgeon.  When done, rinse skin with warm water and apply moisturizer.  GET HELP RIGHT AWAY IF: You have extensive skin involvement or the symptoms return after treatment Symptoms don't go away after treatment. Severe itching that persists. If you rash spreads or swells. If you rash begins to smell. If it blisters and opens or develops a yellow-brown crust. You develop a fever. You have a sore throat. You become short of breath.  MAKE SURE YOU:  Understand these instructions. Will watch your condition. Will get help right away if you are not doing well or get worse.  Thank you for choosing an e-visit. Your e-visit answers were reviewed by a board certified advanced clinical practitioner to complete your personal care plan. Depending upon the condition, your plan could have included both over the counter or prescription medications. Please review your pharmacy choice. Be sure that the pharmacy you have chosen  is open so that you can pick up your prescription now.  If there is a problem you may message your provider in MyChart to have the prescription routed to another pharmacy. Your safety is important to us . If you have drug allergies check your prescription carefully.  For the next 24 hours, you can use MyChart to ask questions about today's visit, request a non-urgent call back, or ask for a work or school excuse from your e-visit provider. You will get an email in the next two days asking about your experience. I hope that your e-visit has been valuable and will speed your recovery.  I have spent 5 minutes in review of e-visit questionnaire, review and updating patient chart, medical decision making and response to patient.   Teena Shuck, PA-C

## 2024-10-25 ENCOUNTER — Telehealth: Payer: Self-pay

## 2024-10-25 ENCOUNTER — Ambulatory Visit: Admitting: Urology

## 2024-10-25 NOTE — Telephone Encounter (Signed)
 Called pt back about canceling appointment per patient.

## 2024-12-18 ENCOUNTER — Telehealth: Admitting: Physician Assistant

## 2024-12-18 DIAGNOSIS — L03311 Cellulitis of abdominal wall: Secondary | ICD-10-CM | POA: Diagnosis not present

## 2024-12-18 MED ORDER — CEPHALEXIN 500 MG PO CAPS: 500.0000 mg | ORAL_CAPSULE | Freq: Four times a day (QID) | ORAL | 0 refills | Status: AC

## 2024-12-18 NOTE — Progress Notes (Signed)
 E Visit for Cellulitis  We are sorry that you are not feeling well. Here is how we plan to help!  Based on what you shared, it appears you may have cellulitis.   Cellulitis is a bacterial skin infection that typically presents with redness, swelling, warmth, and tenderness in the affected area. Small red spots, minor bleeding under the skin, and fluid-filled blisters may also appear. Fever can sometimes accompany the infection.    Cellulitis usually affects only one side of the body, with the lower limbs being the most commonly involved area. If this returns again, you will need to be seen in person.  I have prescribed:  Keflex  500 mg -- Take one by mouth four times a day for 5 days  HOME CARE:  Take all medications as prescribed and be sure to complete the full course - even if your skin appears to be improving.   GET HELP RIGHT AWAY IF:  Symptoms do not begin to improve within 48 hours. Severe redness persists or worsens. If the area turns color, spreads or swells. If it blisters and opens, develops yellow-brown crust or bleeds. You develop a fever or chills. If the pain increases or becomes unbearable.  Are unable to keep fluids and food down.  MAKE SURE YOU   Understand these instructions. Will watch your condition. Will get help right away if you are not doing well or get worse.  Thank you for choosing an e-visit.   Your e-visit answers were reviewed by a board certified advanced clinical practitioner to complete your personal care plan. Depending upon the condition, your plan could have included both over the counter or prescription medications.   Please review your pharmacy choice. Make sure the pharmacy is open so you can pick up the prescription now. If there is a problem, you may contact your provider through [employer] and have the prescription routed to another pharmacy.   Your safety is important to us . If you have drug allergies, check your prescription  carefully.   For the next 24 hours you can use MyChart to ask questions about today's visit, request a non-urgent call back, or ask for a work or school excuse.   You will receive an email in the next two days asking about your experience. I hope that your e-visit has been valuable and will speed up your recovery.    I have spent 5 minutes in review of e-visit questionnaire, review and updating patient chart, medical decision making and response to patient.   Teena Shuck, PA-C

## 2025-01-26 ENCOUNTER — Other Ambulatory Visit (HOSPITAL_COMMUNITY)

## 2025-02-05 ENCOUNTER — Ambulatory Visit: Admitting: Urology
